# Patient Record
Sex: Female | Born: 1945 | Race: White | Hispanic: No | Smoking: Former smoker
Health system: Southern US, Community
[De-identification: ages and names within clinical notes are randomized; demographics above are authoritative.]

## PROBLEM LIST (undated history)

## (undated) DIAGNOSIS — E538 Deficiency of other specified B group vitamins: Secondary | ICD-10-CM

## (undated) DIAGNOSIS — M549 Dorsalgia, unspecified: Secondary | ICD-10-CM

## (undated) DIAGNOSIS — C539 Malignant neoplasm of cervix uteri, unspecified: Secondary | ICD-10-CM

## (undated) DIAGNOSIS — F329 Major depressive disorder, single episode, unspecified: Secondary | ICD-10-CM

## (undated) DIAGNOSIS — I10 Essential (primary) hypertension: Secondary | ICD-10-CM

## (undated) DIAGNOSIS — E039 Hypothyroidism, unspecified: Secondary | ICD-10-CM

## (undated) DIAGNOSIS — I2510 Atherosclerotic heart disease of native coronary artery without angina pectoris: Secondary | ICD-10-CM

## (undated) DIAGNOSIS — F32A Depression, unspecified: Secondary | ICD-10-CM

## (undated) DIAGNOSIS — G8929 Other chronic pain: Secondary | ICD-10-CM

## (undated) DIAGNOSIS — I219 Acute myocardial infarction, unspecified: Secondary | ICD-10-CM

## (undated) DIAGNOSIS — K219 Gastro-esophageal reflux disease without esophagitis: Secondary | ICD-10-CM

## (undated) DIAGNOSIS — F419 Anxiety disorder, unspecified: Secondary | ICD-10-CM

## (undated) DIAGNOSIS — E559 Vitamin D deficiency, unspecified: Secondary | ICD-10-CM

## (undated) DIAGNOSIS — C801 Malignant (primary) neoplasm, unspecified: Principal | ICD-10-CM

## (undated) DIAGNOSIS — D649 Anemia, unspecified: Secondary | ICD-10-CM

## (undated) HISTORY — PX: OTHER SURGICAL HISTORY: SHX169

## (undated) HISTORY — DX: Malignant neoplasm of cervix uteri, unspecified: C53.9

## (undated) HISTORY — PX: ABDOMINAL HYSTERECTOMY: SHX81

## (undated) HISTORY — PX: CARDIAC CATHETERIZATION: SHX172

## (undated) HISTORY — DX: Malignant (primary) neoplasm, unspecified: C80.1

## (undated) HISTORY — PX: CORONARY ARTERY BYPASS GRAFT: SHX141

---

## 1982-04-05 DIAGNOSIS — C539 Malignant neoplasm of cervix uteri, unspecified: Secondary | ICD-10-CM

## 1982-04-05 HISTORY — DX: Malignant neoplasm of cervix uteri, unspecified: C53.9

## 2003-11-18 ENCOUNTER — Encounter: Payer: Self-pay | Admitting: Cardiology

## 2003-11-18 ENCOUNTER — Inpatient Hospital Stay (HOSPITAL_COMMUNITY): Admission: EM | Admit: 2003-11-18 | Discharge: 2003-11-20 | Payer: Self-pay | Admitting: *Deleted

## 2005-09-03 ENCOUNTER — Encounter: Payer: Self-pay | Admitting: Cardiology

## 2005-10-18 ENCOUNTER — Encounter: Payer: Self-pay | Admitting: Physician Assistant

## 2005-10-18 ENCOUNTER — Ambulatory Visit: Payer: Self-pay | Admitting: Cardiology

## 2005-10-21 ENCOUNTER — Ambulatory Visit: Payer: Self-pay | Admitting: Cardiology

## 2009-01-30 ENCOUNTER — Encounter: Payer: Self-pay | Admitting: Cardiology

## 2010-08-21 NOTE — Discharge Summary (Signed)
NAME:  Erika Moreno, Erika Moreno NO.:  [id number]   MEDICAL RECORD NO.:  [id number]                   PATIENT TYPE:  INP   LOCATION:  6522                                 FACILITY:  [facility]   PHYSICIAN:  Willa Rough, M.D.                  DATE OF BIRTH:  1945/07/02   DATE OF ADMISSION:  11/18/2003  DATE OF DISCHARGE:  11/20/2003                                 DISCHARGE SUMMARY   PRIMARY CARE PHYSICIAN:  [place]. [place]ara Chute [place]   CARDIOLOGIST:  Dr. Nona Dell, [facility] at the [facility] office   Patient admitted for progressive chest pain.  Transferred from [facility].   PROBLEM LIST:  1. Progressive chest pain.  Known history of coronary artery disease status     post remote coronary artery bypass graft.  Abnormal Cardiolite in 2003     with preserved ejection fraction.  2. Hypertension.  3. Dyslipidemia.  4. Osteoarthritis.  5. Fibromyalgia.  6. History of subclinical hypothyroidism.  This is apparently stable     requiring no medications with a normal TSH recently by the patient's     primary care physician.  7. Remote history of tobacco use.   The patient admitted this admission, as stated, transferred from [facility] with chest pain, EKG showing no acute ST-T wave changes  compared to previous tracing in November of 2000.  She has evidence of a  previous anterior wall myocardial infarction with an incomplete right bundle  branch block pattern and probable left ventricular hypertrophy.  Initial  cardiac markers were negative.  On this admission the patient states her  last cardiac catheterization was in [place], [place] approximately  four to five years ago.  The patient was seen at the [facility] office in  September of 2003.  She received a Cardiolite which showed an ejection  fraction of 68% with a small inferior apical defect which looked to be  suggestive of mild degree of ischemia.  She  was managed medically at that  time.  This admission chest pain has become progressively worse.  Initial  cardiac enzymes are negative.  EKG without any major changes from previous  tracing.  The patient will be admitted to telemetry and cardiac markers  done.  Will continue home medications with Lovenox and potassium supplement.  This admission will also check a lipid profile and hemoglobin A1C.  On  August 16 Dr. Gerri Spore took patient to cardiac catheterization laboratory.  Left heart catheterization completed.  Also, patient received percutaneous  transluminal coronary angioplasty with placement of a drug-eluting stent in  the ostium and proximal portion of the saphenous vein graft to the first  diagonal branch.  Impression showed preserved left ventricular systolic  function, native two vessel coronary artery disease status post coronary  artery bypass graft, patent left internal mammary artery to the distal left  anterior  descending coronary artery.  The native right coronary artery  itself was 100% occluded.  Results:  Successful placement of a drug-eluting  stent in the ostium of the saphenous vein graft to the saphenous vein graft  to the first diagonal branch.  A 75% stenosis was reduced to 0% residual.  The patient continued to receive Integrilin for 18 hours post  catheterization and will be treated with Plavix for 12 months.  Cardiac  enzymes x3 negative.  Hemoglobin A1C checked this admission 5.3.  Discharging CBC:  WBC is 4.5, hemoglobin 11.0, hematocrit 31.9, platelet  count 179.  PT performed on August 15 12.4 with an INR 0.9.  Initial  potassium on August 15 was 3.5.  The patient supplemented with p.o.  potassium.  Discharge chemistry:  Sodium 135, potassium 3.7, glucose 132,  BUN 10, creatinine 0.9 which was baseline.  Fasting lipid profile done with  a cholesterol of 150, triglycerides 186, HDL 40, LDL 73.  The patient  continued to receive her medications from home  including Lotensin, aspirin,  Lasix, isosorbide, TriCor, Prozac, Flexeril, Lipitor, Valium with the  addition of Plavix and Tylox for p.r.n. pain control.  The patient seen by  Dr. Willa Rough today.  Right groin stable without hematoma or bruit.  Integrilin infusion completed.  The patient agrees to follow up with Dr.  Diona Browner in [place] in two to three weeks.  Will continue all medications prior  to this admission as previously taken with new addition of Plavix 75 mg and  a prescription for nitroglycerin.  The patient instructed to use p.r.n. for  chest discomfort.  The patient instructed to avoid any heavy lifting over 10  pounds x1 week.  No driving or strenuous activity x2 days. Follow a low fat,  low salt, low cholesterol diet.  The patient instructed to gently clean  catheterization site with soap and water.  No tub bathing or swimming x1  week.  She will call the [facility] office for any fever greater than 101,  increased pain or swelling from catheterization site.  Also, will make  appointment for two weeks to follow up with Dr. Diona Browner with the [facility]  office and possibly have potassium level checked at that time as he feels  needs to be since patient is not being discharged home on prescription  potassium per Dr. Myrtis Ser.  The patient states she has had cardiac  rehabilitation in the past.  Cardiac rehabilitation did evaluate patient  this admission and not interested in following the cardiac rehabilitation  phase II at this time.      Dorian Pod, NP                       Willa Rough, M.D.    MB/MEDQ  D:  11/20/2003  T:  11/21/2003  Job:  [id number]   cc:   Jonelle Sidle, M.D. [facility]  [address] [place]  [place] [address]  Fax: [phone]

## 2010-08-21 NOTE — H&P (Signed)
NAME:  Erika, Moreno NO.:  [id number]   MEDICAL RECORD NO.:  [id number]                   PATIENT TYPE:  INP   LOCATION:  2039                                 FACILITY:  [facility]   PHYSICIAN:  Jonelle Sidle, M.D. [facility]        DATE OF BIRTH:  [birth year]   DATE OF ADMISSION:  11/18/2003  DATE OF DISCHARGE:                                HISTORY & PHYSICAL   PRIMARY CARE PHYSICIAN:  Dr. Fara Chute, [place], [place].   CARDIOLOGIST:  Jonelle Sidle, M.D. [facility]   CHIEF COMPLAINT:  Progressive chest pain.   HISTORY OF PRESENT ILLNESS:  Erika Moreno is a 65 year old woman with a  reported history of dyslipidemia, hypertension, and coronary artery disease,  status post previous coronary artery bypass grafting in 1994 in [place],  [place]. I do not have the specific records at hand, but it sounds as if  the patient had three grafts including a LIMA and two vein grafts. Her last  cardiac catheterization reportedly occurred in [place], [place],  approximately four to five years ago and by the patient's account at that  time showed an occluded vein graft with an additional diseased vein graft  and apparently a patent LIMA. We saw her back in September 2003 in the [place]  office at which time she predominately was complaining of palpitations and  underwent a fairly unremarkable event recorder as well as a Cardiolite which  was reported to show an ejection fraction of 68% with technically  nondiagnostic findings due to failure to achieve 85% of the maximum  predicted heart rate. She had a small inferior apical defect which looked to  be suggestive of a mild degree of ischemia as well as attenuation artifact  involving the inferolateral wall. This study was felt to be overall low risk  and she was managed medically since that time.   Erika Moreno has done well, stating that she is active, working outdoors  much of the time, but over the last six  months has had progressive shortness  of breath with activity, fatigue, and over the last two months had sharp  intermittent left-sided chest discomfort. This occurs both with and without  activity and seems to last only a few seconds to a minute at most. Symptoms  have been progressive and much more frequent over the last few days, and she  had a particularly intense episode while mowing her grass today prompting  evaluation in the [facility] emergency department. She is now  transferred for further assessment.   Electrocardiogram at [facility] showed no acute ST-T wave  changes compared to previous tracing from November 2000. She has evidence of  previous anterior wall myocardial infarction with an incomplete right bundle  branch block pattern and probable left ventricular hypertrophy. Initial  cardiac markers are negative. She is asymptomatic at this point.   ALLERGIES:  MORPHINE (hallucinations).   MEDICATIONS AT HOME:  1. Lotensin  20 mg p.o. daily.  2. Enteric-coated aspirin 325 mg p.o. daily.  3. Lasix 40 mg p.o. daily.  4. Lipitor 10 mg p.o. daily.  5. Imdur 60 mg p.o. daily.  6. Tricor 48 mg p.o. daily.  7. Xanax 0.25 mg p.o. t.i.d. p.r.n.  8. Vicodin one to two tablets p.o. q.4-6h. p.r.n.  9. Prozac 20 mg p.o. daily.  10.      Flexeril 10 mg p.o. t.i.d.   PAST MEDICAL HISTORY:  1. Reported history of remote myocardial infarction just prior to coronary     artery bypass grafting.  It sounds as if the patient was acutely ill and     had cardiac arrest at that time. She underwent three-vessel bypass     grafting at [place], [place], in 1994 as described above.  Her most     recent ejection fraction was 68% by Cardiolite in September 2003.  2. Dyslipidemia, specifics unknown.  3. Hypertension.  4. Osteoarthritis.  5. Fibromyalgia.  6. History of subclinical hyperthyroidism managed at [facility]. This is apparently  stabilized and is requiring no medications     with a reportedly normal TSH recently by the patient's primary care     physician.   FAMILY HISTORY:  Significant for coronary artery disease.   SOCIAL HISTORY:  The patient lives in [place], [place].  She states  that she is disabled presumably due to chronic pain with a history of  osteoarthritis and fibromyalgia. She has a remote history of tobacco use.  She denies any significant other substance abuse.   REVIEW OF SYSTEMS:  As described in history of present illness. She has not  had any problems with recent palpitations, syncope, orthopnea, or PND. She  has had some lower extremity edema, left worse than right. She also has a  numb sensation in her left foot at times.  She is having no difficulty with  cough, hemoptysis, melena, hematochezia, easy bruising, or abdominal  discomfort.   PHYSICAL EXAMINATION:  VITAL SIGNS: On presentation, blood pressure was  120/57 with a heart rate of 57, respirations 22, temperature 98 degrees, and  oxygen saturation 100%.  GENERAL: This is a well-nourished woman, seated in bed in no acute distress,  wearing glasses.  HEENT: Conjunctiva and nose normal. Oropharynx is clear.  NECK: Supple without elevated jugular venous pressure or carotid bruits. No  thyromegaly or thyroid tenderness is noted.  LUNGS: Clear to auscultation with nonlabored breathing at rest.  CARDIAC: Regular rate and rhythm with a 2/6 systolic murmur heard at the  base without radiation. S1 is normal. There is no S3 gallop or pericardial  rub.  ABDOMEN: Soft and nontender with normoactive bowel sounds.  EXTREMITIES: No pitting edema today. Peripheral pulses 2+.  SKIN: No ulcerative changes are noted.  MUSCULOSKELETAL:  No kyphosis is noted.  NEUROPSYCHIATRIC: The patient is alert and oriented times three.  OUTSIDE LABORATORY DATA:  INR 1.0. WBC 4.8, hemoglobin 12.1, platelet count  209,000, glucose 132, BUN 17, creatinine  1.2, sodium 137, potassium 3.2  (repleted at [facility] by report), chloride 105, bicarbonate 23. CK 136, CK-  MB 2.3, troponin-I 0.01.   Chest x-ray from [facility] reported as showing no acute  process.   IMPRESSION:  1. Progressive chest pain syndrome as described in a 65 year old woman with     known coronary artery disease, status post remote coronary artery bypass     grafting. She had a mildly  abnormal Cardiolite in 2003 with preserved     ejection fraction. Her initial cardiac markers are negative and her     electrocardiogram shows no major changes from previous tracing. She has     also had associated shortness of breath with activity and fatigue.  2. Hypertension.  3. Reported dyslipidemia.   PLAN:  1. Will admit the patient to telemetry and cycle cardiac markers.  2. Continue home medications with the addition of Lovenox and potassium     supplementation.  3. Will try to obtain old records to flush out the patient's prior bypass     history.  4. Check fasting lipid profile and hemoglobin A1C.  5. After discussing the risks and benefits with the patient and her     daughter, will plan diagnostic coronary angiography tomorrow to assess     native and bypass graft anatomy. Further plans to follow.                                                Jonelle Sidle, M.D. [facility]    SGM/MEDQ  D:  11/18/2003  T:  11/18/2003  Job:  [id number]

## 2010-08-21 NOTE — Assessment & Plan Note (Signed)
[facility]                            [facility] OFFICE NOTE   SYVILLA, MARTIN                     MRN:          [id number]  DATE:10/18/2005                            DOB:          25-Feb-1946    REASON FOR OFFICE VISIT:  Ms. Cenci is a very pleasant 65 year old  female, with known coronary artery disease, who now returns for followup and  evaluation of recent chest pain and progressive dyspnea.   CARDIAC HISTORY:  Patient's cardiac history notable for myocardial  infarction treated with subsequent coronary artery bypass grafting in [address]  [place], [place]).  She then underwent CYPHER stenting of the proximal  and SVG-  1 diagonal graft, by Dr. Gerri Spore, in August 2005.  Residual anatomy  notable for patent LIMA-LAD and chronic occlusion of the SVG distal RCA  graft.  There was 100% occlusion of the RCA with right-right bridging  collaterals.  Left ventricular function was normal with no wall motion  abnormalities.   Patient remains quite active in and outside of her home, including  gardening.  She can easily climb a flight of stairs or walk up inclines but  with some associated exertional dyspnea which seems to have worsened in the  recent past.  She has no associated exertional chest discomfort but has had  some brief, sharp left upper chest pains which last only approximately 30  seconds in duration.   Patient also has significant reflux disease as well as fibromyalgia.   Electrocardiogram today reveals sinus bradycardia at 52 BPM, with left axis  deviation and an incomplete right bundle branch block with no acute changes.   CURRENT MEDICATIONS:  1.  Furosemide 40 q.d.  2.  Fluoxetine 20 q.d.  3.  Plavix 75q.d.  4.  Lipitor 10 q.d.  5.  Isosorbide mononitrate's 60 q.d.  6.  Premarin.  7.  Tricor 145 q.d.  8.  Benazepril 20 q.d.  9.  Aspirin 325 q.d.  10. Hydrocodone 5/500 two tablets q.i.d.  11. Cyclobenzaprine 10  q.i.d.  12. Xanax 0.25 q.i.d.  13. Fish oil 1200 q.d.   PHYSICAL EXAMINATION:  VITAL SIGNS:  Blood pressure 110/70.  Pulse 50's,  regular.  Weight 158.5.  GENERAL:  65 year old female sitting upright, no apparent distress.  NECK:  Palpable carotid pulse without bruits.  LUNGS:  Clear to auscultation in all fields.  HEART:  Regular rate and rhythm (S1-S2), soft grade 1/6 systolic ejection  murmur in upper LSB.  ABDOMEN:  Soft, non-tender, with intact bowel sounds.  EXTREMITIES:  Palpable femoral pulses without bruits; intact peripheral  pulses with a trace-1+ lower extremity pedal edema (left greater than  right).  NEUROLOGIC:  No focal deficit.   IMPRESSION:  1.  Atypical chest pain.  2.  Coronary artery disease.      1.  Status post myocardial infarction/3 vessel CABG in [address] [place],          [place]).      2.  Status post CYPHER stenting SVG-diagonal graft August 2005, with          widely patent LIMA-LAD graft  and chronically occluded SVG-RCA graft          with right-right bridging collaterals.      3.  Normal left ventricular function.  3.  History of tobacco.      1.  Has not smoked in 20 years.  4.  Hyperlipidemia.  5.  Gastroesophageal reflux disease.  6.  Fibromyalgia.  7.  Sinus Bradycardia   PLAN:  1.  Schedule exercise stress Cardiolite to exclude ischemia.  2.  Return to clinic in two weeks to follow up with Dr. Simona Huh for      review of  study results and further recommendations.                                   Gene Serpe, PA-C   GS/MedQ  DD:  10/18/2005  DT:  10/18/2005  Job #:  [id number]   cc:   Fara Chute

## 2010-08-21 NOTE — Assessment & Plan Note (Signed)
[facility]                            [facility] NOTE   NAME:Erika Moreno, Erika Moreno                     MRN:          [id number]  DATE:10/18/2005                            DOB:          13-Oct-1945    ADDENDUM:  Please add the following at the end of impression:   IMPRESSION:  Sinus bradycardia.                                   Gene Serpe, PA-C   GS/MedQ  DD:  10/18/2005  DT:  10/18/2005  Job #:  [id number]

## 2010-08-21 NOTE — Cardiovascular Report (Signed)
NAME:  Erika Moreno, Erika Moreno                        ACCOUNT NO.:  [id number]   MEDICAL RECORD NO.:  [id number]                   PATIENT TYPE:  INP   LOCATION:  2039                                 FACILITY:  [facility]   PHYSICIAN:  Carole Binning, M.D. [facility]         DATE OF BIRTH:  September 05, 1945   DATE OF PROCEDURE:  11/19/2003  DATE OF DISCHARGE:                              CARDIAC CATHETERIZATION   PROCEDURE PERFORMED:  1. Left heart catheterization, left coronary angiography, left     ventriculography, and bypass graft angiography.  2. Percutaneous transluminal coronary angioplasty with placement of a drug-     eluting stent in the ostium and proximal portion of the saphenous vein     graft to the first diagonal branch.   INDICATIONS:  Mr. Fanara is a 65 year old woman with a history of previous  coronary artery bypass surgery.  She was admitted to [facility] with  unstable angina and transferred to [facility] for cardiac catheterization.   CATHETERIZATION PROCEDURAL NOTE:  A 6 French sheath was placed in the right  femoral artery.  Coronary artery was performed with standard Judkins 6  French catheters.  The saphenous vein grafts and internal mammary artery  were imaged with a JR4 catheter.  Left ventriculography was performed with  an angled pigtail catheter.  Contrast was Omnipaque.  There were no  complications.   RESULTS:  </HEMODYNAMICS>  Left ventricular pressure 126/20, aortic pressure 122/65.  There was no  aortic valve gradient.   LEFT VENTRICULOGRAM:  Wall motion is normal, ejection fraction estimated at  greater than or equal to 60%.  There is no mitral regurgitation.   CORONARY ARTERIOGRAPHY (CO-DOMINANT):  1. The left main has tubular 30% stenosis in the distal vessel.  2. Left anterior descending artery is 100% occluded at its origin.  The     distal LAD and first diagonal branch fill via bypass grafts.  3. Left circumflex is a co-dominant vessel.  The  circumflex gives rise to a     small ramus intermedius, small first obtuse marginal branch, large second     obtuse marginal branch, and a small posterolateral branch.  There is a     30% stenosis in the proximal circumflex.  The distal circumflex after the     second obtuse marginal branch also has a 30% stenosis.  The first     marginal has a 30% stenosis proximally and the large second obtuse     marginal also has a 30% stenosis proximally.  4. Right coronary artery is a co-dominant vessel.  The right coronary artery     is 100% occluded proximally with grade 3 bridging right-to-right     collaterals filling the mid- and distal right coronary artery.  In the     mid-right coronary artery there is a diffuse 20% stenosis, and the distal     right coronary has a diffuse 30% stenosis.  The distal  right coronary     artery ends as a normal-sized posterior descending artery.  5. Left internal mammary artery to the distal LAD is patent throughout its     course.  This vessel fills the mid- and distal LAD, including a small     diagonal branch.  6. Saphenous vein graft to the first diagonal branch has a 75% stenosis at     its ostium, followed by a 30% stenosis in the proximal body.  This graft     fills a large first diagonal vessel.  7. Saphenous vein graft to the right coronary artery is 100% occluded at its     origin.  This appears to be chronic in nature.   IMPRESSION:  1. Preserved left ventricular systolic function.  2. Native two-vessel coronary artery disease.  3. Status post coronary artery bypass surgery.  There is a patent left     internal mammary artery to the distal left anterior descending coronary     artery.  There is a chronic occlusion in the vein graft to the distal     right coronary artery.  The native right coronary artery itself is 100%     occluded but fills via excellent right-to-right bridging collaterals.     There is significant disease in the ostium of the  saphenous vein graft to     the diagonal branch.   PLAN:  These findings were reviewed with Dr. Riley Kill.  It was felt that the  disease in the ostium of the diagonal branch does appear to be  hemodynamically significant.  We therefore opted to proceed with  percutaneous coronary intervention.  See below.   PERCUTANEOUS TRANSLUMINAL CORONARY ANGIOPLASTY PROCEDURAL NOTE:  Following  completion of the diagnostic catheterization, we proceeded with percutaneous  coronary intervention.  We utilized the pre-existing 6 French sheath in the  right femoral artery.  Heparin and Integrilin were administered per  protocol.  We used a 6 [place] LCB guiding catheter. An Asahi soft coronary  guidewire was advanced under fluoroscopic guidance into the distal portion  of the saphenous vein graft.  We carefully positioned a 2.5 x 25 mm Cypher  drug-eluting stent in the proximal portion of the vein graft with care to  position the origin of the stent in the ostium of the vein graft.  We  deployed this stent at 15 atmospheres.  We then went back in with a 3.0 x 15  mm Quantum balloon and inflated this to 16 atmospheres in the proximal  portion of the stent and 10 atmospheres in the midportion of the stent.  Final angiographic images were obtained revealing the patency of the  saphenous vein graft with 0% residual stenosis at the stent site and TIMI-3  flow.   At the conclusion an Angioseal vascular closure device was placed in the  right femoral artery with good hemostasis.   COMPLICATIONS:  None.   RESULTS:  Successful placement of a drug-eluting stent in the ostium of the  saphenous vein graft to the saphenous vein graft to the first diagonal  branch.  A 75% stenosis was reduced to 0% residual, TIMI-3 flow.   PLAN:  Integrilin will be continued for 18 hours.  The patient will be  treated with Plavix for a recommended six to 12 months.  Would recommend medical therapy for residual coronary artery  disease.  Carole Binning, M.D. [facility]    MWP/MEDQ  D:  11/19/2003  T:  11/20/2003  Job:  [id number]   cc:   Fara Chute  [address] [place]  [place] [address]  Fax: [phone]   [facility], [place], [place]   Cardiac Catheterization Lab

## 2012-09-13 ENCOUNTER — Encounter (INDEPENDENT_AMBULATORY_CARE_PROVIDER_SITE_OTHER): Payer: PRIVATE HEALTH INSURANCE | Admitting: Internal Medicine

## 2012-09-13 DIAGNOSIS — Z800 Family history of malignant neoplasm of digestive organs: Secondary | ICD-10-CM

## 2012-09-13 DIAGNOSIS — C778 Secondary and unspecified malignant neoplasm of lymph nodes of multiple regions: Secondary | ICD-10-CM

## 2012-09-13 DIAGNOSIS — C801 Malignant (primary) neoplasm, unspecified: Secondary | ICD-10-CM

## 2012-09-13 DIAGNOSIS — D509 Iron deficiency anemia, unspecified: Secondary | ICD-10-CM

## 2012-09-25 ENCOUNTER — Encounter: Payer: Self-pay | Admitting: Internal Medicine

## 2012-09-27 ENCOUNTER — Other Ambulatory Visit (HOSPITAL_COMMUNITY): Payer: Self-pay | Admitting: Internal Medicine

## 2012-09-27 ENCOUNTER — Encounter (INDEPENDENT_AMBULATORY_CARE_PROVIDER_SITE_OTHER): Payer: PRIVATE HEALTH INSURANCE | Admitting: Internal Medicine

## 2012-09-27 DIAGNOSIS — D509 Iron deficiency anemia, unspecified: Secondary | ICD-10-CM

## 2012-09-27 DIAGNOSIS — C50919 Malignant neoplasm of unspecified site of unspecified female breast: Secondary | ICD-10-CM

## 2012-10-03 ENCOUNTER — Encounter (HOSPITAL_COMMUNITY)
Admission: RE | Admit: 2012-10-03 | Discharge: 2012-10-03 | Disposition: A | Discharge status: Home / Self Care | Payer: PRIVATE HEALTH INSURANCE | Source: Ambulatory Visit | Attending: Internal Medicine | Admitting: Internal Medicine

## 2012-10-03 DIAGNOSIS — I700 Atherosclerosis of aorta: Secondary | ICD-10-CM | POA: Insufficient documentation

## 2012-10-03 DIAGNOSIS — N200 Calculus of kidney: Secondary | ICD-10-CM | POA: Insufficient documentation

## 2012-10-03 DIAGNOSIS — N9489 Other specified conditions associated with female genital organs and menstrual cycle: Secondary | ICD-10-CM | POA: Insufficient documentation

## 2012-10-03 DIAGNOSIS — R599 Enlarged lymph nodes, unspecified: Secondary | ICD-10-CM | POA: Insufficient documentation

## 2012-10-03 DIAGNOSIS — C50919 Malignant neoplasm of unspecified site of unspecified female breast: Secondary | ICD-10-CM

## 2012-10-03 LAB — GLUCOSE, CAPILLARY: Glucose-Capillary: 82 mg/dL (ref 70–99)

## 2012-10-03 MED ORDER — FLUDEOXYGLUCOSE F - 18 (FDG) INJECTION
19.2000 | Freq: Once | INTRAVENOUS | Status: AC | PRN
  Administered 2012-10-03: 19.2 via INTRAVENOUS

## 2012-10-04 DIAGNOSIS — C569 Malignant neoplasm of unspecified ovary: Secondary | ICD-10-CM

## 2012-10-04 DIAGNOSIS — C50919 Malignant neoplasm of unspecified site of unspecified female breast: Secondary | ICD-10-CM

## 2012-10-05 ENCOUNTER — Encounter (HOSPITAL_COMMUNITY): Payer: Medicare Other

## 2012-10-10 DIAGNOSIS — C770 Secondary and unspecified malignant neoplasm of lymph nodes of head, face and neck: Secondary | ICD-10-CM

## 2012-10-10 DIAGNOSIS — C801 Malignant (primary) neoplasm, unspecified: Secondary | ICD-10-CM

## 2012-10-10 DIAGNOSIS — C773 Secondary and unspecified malignant neoplasm of axilla and upper limb lymph nodes: Secondary | ICD-10-CM

## 2012-10-10 DIAGNOSIS — Z5111 Encounter for antineoplastic chemotherapy: Secondary | ICD-10-CM

## 2012-10-17 DIAGNOSIS — C770 Secondary and unspecified malignant neoplasm of lymph nodes of head, face and neck: Secondary | ICD-10-CM

## 2012-10-17 DIAGNOSIS — C773 Secondary and unspecified malignant neoplasm of axilla and upper limb lymph nodes: Secondary | ICD-10-CM

## 2012-10-17 DIAGNOSIS — C801 Malignant (primary) neoplasm, unspecified: Secondary | ICD-10-CM

## 2012-10-17 DIAGNOSIS — Z5111 Encounter for antineoplastic chemotherapy: Secondary | ICD-10-CM

## 2012-10-18 ENCOUNTER — Encounter (INDEPENDENT_AMBULATORY_CARE_PROVIDER_SITE_OTHER): Payer: PRIVATE HEALTH INSURANCE

## 2012-10-18 DIAGNOSIS — C773 Secondary and unspecified malignant neoplasm of axilla and upper limb lymph nodes: Secondary | ICD-10-CM

## 2012-10-18 DIAGNOSIS — C770 Secondary and unspecified malignant neoplasm of lymph nodes of head, face and neck: Secondary | ICD-10-CM

## 2012-10-18 DIAGNOSIS — C801 Malignant (primary) neoplasm, unspecified: Secondary | ICD-10-CM

## 2012-10-24 DIAGNOSIS — C801 Malignant (primary) neoplasm, unspecified: Secondary | ICD-10-CM

## 2012-10-24 DIAGNOSIS — Z5111 Encounter for antineoplastic chemotherapy: Secondary | ICD-10-CM

## 2012-10-24 DIAGNOSIS — C50919 Malignant neoplasm of unspecified site of unspecified female breast: Secondary | ICD-10-CM

## 2012-11-06 ENCOUNTER — Encounter (INDEPENDENT_AMBULATORY_CARE_PROVIDER_SITE_OTHER): Payer: PRIVATE HEALTH INSURANCE

## 2012-11-06 DIAGNOSIS — C778 Secondary and unspecified malignant neoplasm of lymph nodes of multiple regions: Secondary | ICD-10-CM

## 2012-11-06 DIAGNOSIS — C770 Secondary and unspecified malignant neoplasm of lymph nodes of head, face and neck: Secondary | ICD-10-CM

## 2012-11-06 DIAGNOSIS — C801 Malignant (primary) neoplasm, unspecified: Secondary | ICD-10-CM

## 2012-11-07 DIAGNOSIS — C801 Malignant (primary) neoplasm, unspecified: Secondary | ICD-10-CM

## 2012-11-07 DIAGNOSIS — C770 Secondary and unspecified malignant neoplasm of lymph nodes of head, face and neck: Secondary | ICD-10-CM

## 2012-11-07 DIAGNOSIS — Z5112 Encounter for antineoplastic immunotherapy: Secondary | ICD-10-CM

## 2012-11-07 DIAGNOSIS — C786 Secondary malignant neoplasm of retroperitoneum and peritoneum: Secondary | ICD-10-CM

## 2012-11-07 DIAGNOSIS — Z5111 Encounter for antineoplastic chemotherapy: Secondary | ICD-10-CM

## 2012-11-14 ENCOUNTER — Encounter (HOSPITAL_COMMUNITY): Payer: Self-pay | Admitting: Oncology

## 2012-11-14 ENCOUNTER — Other Ambulatory Visit (HOSPITAL_COMMUNITY): Payer: Self-pay | Admitting: Oncology

## 2012-11-14 DIAGNOSIS — C801 Malignant (primary) neoplasm, unspecified: Secondary | ICD-10-CM | POA: Insufficient documentation

## 2012-11-14 HISTORY — DX: Malignant (primary) neoplasm, unspecified: C80.1

## 2012-11-15 ENCOUNTER — Encounter (HOSPITAL_BASED_OUTPATIENT_CLINIC_OR_DEPARTMENT_OTHER): Payer: PRIVATE HEALTH INSURANCE

## 2012-11-15 ENCOUNTER — Encounter (HOSPITAL_COMMUNITY): Payer: Self-pay | Admitting: Internal Medicine

## 2012-11-15 ENCOUNTER — Encounter (HOSPITAL_COMMUNITY): Payer: PRIVATE HEALTH INSURANCE | Attending: Internal Medicine

## 2012-11-15 VITALS — BP 127/62 | HR 58 | Temp 98.7°F | Resp 18

## 2012-11-15 VITALS — BP 118/65 | HR 67 | Temp 98.0°F | Resp 18 | Ht 64.0 in | Wt 146.6 lb

## 2012-11-15 DIAGNOSIS — Z5111 Encounter for antineoplastic chemotherapy: Secondary | ICD-10-CM

## 2012-11-15 DIAGNOSIS — C801 Malignant (primary) neoplasm, unspecified: Secondary | ICD-10-CM

## 2012-11-15 DIAGNOSIS — C773 Secondary and unspecified malignant neoplasm of axilla and upper limb lymph nodes: Secondary | ICD-10-CM

## 2012-11-15 LAB — COMPREHENSIVE METABOLIC PANEL
AST: 20 U/L (ref 0–37)
CO2: 26 mEq/L (ref 19–32)
Creatinine, Ser: 0.64 mg/dL (ref 0.50–1.10)
Potassium: 4.7 mEq/L (ref 3.5–5.1)
Total Bilirubin: 0.3 mg/dL (ref 0.3–1.2)
Total Protein: 6.7 g/dL (ref 6.0–8.3)

## 2012-11-15 LAB — CBC WITH DIFFERENTIAL/PLATELET
Basophils Relative: 0 % (ref 0–1)
Eosinophils Absolute: 0 10*3/uL (ref 0.0–0.7)
Eosinophils Relative: 0 % (ref 0–5)
Lymphs Abs: 0.7 10*3/uL (ref 0.7–4.0)
MCH: 29.2 pg (ref 26.0–34.0)
MCV: 86.7 fL (ref 78.0–100.0)
Monocytes Relative: 3 % (ref 3–12)
Neutrophils Relative %: 82 % — ABNORMAL HIGH (ref 43–77)
RBC: 3.39 MIL/uL — ABNORMAL LOW (ref 3.87–5.11)
RDW: 17.8 % — ABNORMAL HIGH (ref 11.5–15.5)
WBC: 4.7 10*3/uL (ref 4.0–10.5)

## 2012-11-15 MED ORDER — PALONOSETRON HCL INJECTION 0.25 MG/5ML
0.2500 mg | Freq: Once | INTRAVENOUS | Status: AC
  Administered 2012-11-15: 0.25 mg via INTRAVENOUS

## 2012-11-15 MED ORDER — DIPHENHYDRAMINE HCL 50 MG/ML IJ SOLN
25.0000 mg | Freq: Once | INTRAMUSCULAR | Status: AC
  Administered 2012-11-15: 25 mg via INTRAVENOUS

## 2012-11-15 MED ORDER — DIPHENHYDRAMINE HCL 50 MG/ML IJ SOLN
INTRAMUSCULAR | Status: AC
  Filled 2012-11-15: qty 1

## 2012-11-15 MED ORDER — HEPARIN SOD (PORK) LOCK FLUSH 100 UNIT/ML IV SOLN
500.0000 [IU] | Freq: Once | INTRAVENOUS | Status: DC | PRN
  Filled 2012-11-15: qty 5

## 2012-11-15 MED ORDER — SODIUM CHLORIDE 0.9 % IV SOLN
Freq: Once | INTRAVENOUS | Status: AC
  Administered 2012-11-15: 09:00:00 via INTRAVENOUS

## 2012-11-15 MED ORDER — FAMOTIDINE IN NACL 20-0.9 MG/50ML-% IV SOLN
20.0000 mg | Freq: Once | INTRAVENOUS | Status: AC
  Administered 2012-11-15: 20 mg via INTRAVENOUS

## 2012-11-15 MED ORDER — ACETAMINOPHEN 325 MG PO TABS
650.0000 mg | ORAL_TABLET | Freq: Once | ORAL | Status: AC
  Administered 2012-11-15: 650 mg via ORAL

## 2012-11-15 MED ORDER — DEXTROSE 5 % IV SOLN
60.0000 mg/m2 | Freq: Once | INTRAVENOUS | Status: AC
  Administered 2012-11-15: 102 mg via INTRAVENOUS
  Filled 2012-11-15: qty 17

## 2012-11-15 MED ORDER — SODIUM CHLORIDE 0.9 % IJ SOLN
10.0000 mL | INTRAMUSCULAR | Status: DC | PRN
  Filled 2012-11-15: qty 10

## 2012-11-15 MED ORDER — ACETAMINOPHEN 325 MG PO TABS
ORAL_TABLET | ORAL | Status: AC
  Filled 2012-11-15: qty 2

## 2012-11-15 MED ORDER — SODIUM CHLORIDE 0.9 % IV SOLN
160.0000 mg | Freq: Once | INTRAVENOUS | Status: AC
  Administered 2012-11-15: 160 mg via INTRAVENOUS
  Filled 2012-11-15: qty 16

## 2012-11-15 MED ORDER — SODIUM CHLORIDE 0.9 % IV SOLN
20.0000 mg | Freq: Once | INTRAVENOUS | Status: AC
  Administered 2012-11-15: 20 mg via INTRAVENOUS
  Filled 2012-11-15: qty 2

## 2012-11-15 MED ORDER — FAMOTIDINE IN NACL 20-0.9 MG/50ML-% IV SOLN
INTRAVENOUS | Status: AC
  Filled 2012-11-15: qty 50

## 2012-11-15 MED ORDER — DEXAMETHASONE SODIUM PHOSPHATE 10 MG/ML IJ SOLN: 20.0000 mg | Freq: Once | INTRAMUSCULAR | Status: DC

## 2012-11-15 MED ORDER — PALONOSETRON HCL INJECTION 0.25 MG/5ML
INTRAVENOUS | Status: AC
  Filled 2012-11-15: qty 5

## 2012-11-15 NOTE — Progress Notes (Signed)
Tolerated well

## 2012-11-20 ENCOUNTER — Other Ambulatory Visit (HOSPITAL_COMMUNITY): Payer: Self-pay | Admitting: Oncology

## 2012-11-20 NOTE — Progress Notes (Signed)
[facility] - [facility]    MEDICAL ONCOLOGY - INITIAL CONSULATION  Referral [facility]  Reason for Referral: continuation of treatment for cancer unknown primary/ovarian :no acute complaints  HPI:  Patient was seen for initial clinic visit, referral from [facility], to continue that schedule therapy.  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  Poorly differentiated cancersupport or breast primary but additional staining suggested possible serosal carcinoma so the differential included breast ovarian or endometrial cancer.  A regimen of Taxol plus carboplatinum plus Avastin was chosen.  Avastin to be given at 7.5 mg per kilogram every 4 weeks.  Initially treated on 10/11/12, with Taxol, 60 mg meter square, carboplatinum AUC 1.5 , and Avastin, then treatment 7/15 and 7/22.  Then 7/29 off.  Then the next cycle of Taxol carboplatin and Avastin on 8/5,  With AUC increased to 2...., now on 8/13, would be due for Taxol and Avastin, week 2, then on 8/20 would be due for Taxol and Avastin week 3, then  the next week off and then  continue if we keep this schedule. Patient also been started on Aromasin because of ER positive disease  It was noted that on 10/03/12, prior to therapy, PET scan showed extensive hypermetabolic adenopathy within the chest abdomen and pelvis 10 bilobed cystic mass in the left adnexa.  On questioning patient was alert and cooperative and did not have any acute distress specifically he was having no neuropathy numbness tingling muscle weakness or incoordination or any stomatitis    Past Medical History  Diagnosis Date  . Cancer of unknown origin 11/14/2012    Favor breast versus ovarian  :  No past surgical history on file.:  No current outpatient  prescriptions on file.   No current facility-administered medications for this visit.     Allergies  Allergen Reactions  . Morphine And Related   . Sulfa Antibiotics   :  No family history on file.:  History   Social History  . Marital Status: Widowed    Spouse Name: N/A    Number of Children: N/A  . Years of Education: N/A   Occupational History  . Not on file.   Social History Main Topics  . Smoking status: Not on file  . Smokeless tobacco: Not on file  . Alcohol Use: Not on file  . Drug Use: Not on file  . Sexual Activity: Not on file   Other Topics Concern  . Not on file   Social History Narrative  . No narrative on file  :  Review of systems, patient has some Gen. Weakness and had some back pain, she has had some discomfort in the skin above the knee, discomfort axilla site of a hard fixed node additionally.  Is not having shortness of breath chest pain or abdominal pain or nausea vomiting there was some numbness in the feet and fingers but did not have muscle weakness, no headache no dizziness no dysuria hematuria no visual disturbances or Exam: @IPVITALS @  General:  well-nourished in no acute distress.  Eyes:  no scleral icterus.  ENT:  There were no thrush.  Neck was without thyromegaly.  Lymphatics:  Negative cervical, supraclavicular ,prior some fullness and tenderness in this area, did not repeat the axilla exam  Respiratory:  lungs were clear bilaterally without wheezing or crackles.  Cardiovascular:  Regular rate and rhythm, .  There was no pedal edema.  GI:  abdomen was , nontender, nondistended, without organomegaly.    Skin exam was without echymosis, petichae.  Neuro exam was nonfocal.    Patient was alerted and oriented.  Attention was good.   Language was appropriate.  Mood was normal without depression.  Speech was not pressured.  Thought content was not tangential.     Lab Results  Component Value Date   WBC 4.7 11/15/2012   HGB 9.9* 11/15/2012    HCT 29.4* 11/15/2012   PLT 126* 11/15/2012   GLUCOSE 135* 11/15/2012   ALT 23 11/15/2012   AST 20 11/15/2012   NA 132* 11/15/2012   K 4.7 11/15/2012   CL 98 11/15/2012   CREATININE 0.64 11/15/2012   BUN 23 11/15/2012   CO2 26 11/15/2012    Additional results, white count 4.7 neutrophils 3.9 h bilirubin 0.3 magnesium 2.0   Pathology:  No results found.  Assessment and Plan: patient with cancer of unknown prim, possible primaries endometrial, breast, ovarian, is also estrogen receptor primary.  As reviewed in the history of present illness patient started treatment with Taxol carboplatin and Avastin week 1, Taxol Carbo week 2 and week 3, week 4 off on a q. 28 day schedule.  Dose was modified slightly in cycle 2  Today is week 2 of cycle 2 and patient will receive Taxol and carboplatinum only with premedications.  Patient had also been started on oral Aromasin  As tumor was estrogen positive.  Labs are okay clinical exam okay no significant neuropathy okay to proceed  Plan with premedications, went on and get Taxol 60 mg/meter squared equals 102 mg and carboplatinum AUC 2 equals 160 mg.  Followup in 1 week.. I would advise discontinue Aromasin and not give hormone treatment simultaneously with chemotherapy  Marin Roberts, MD 11/20/2012,10:25 PM

## 2012-11-21 ENCOUNTER — Ambulatory Visit (HOSPITAL_COMMUNITY): Payer: PRIVATE HEALTH INSURANCE

## 2012-11-21 ENCOUNTER — Encounter (HOSPITAL_COMMUNITY): Payer: Self-pay

## 2012-11-21 ENCOUNTER — Encounter (HOSPITAL_COMMUNITY): Payer: PRIVATE HEALTH INSURANCE

## 2012-11-21 ENCOUNTER — Encounter (HOSPITAL_BASED_OUTPATIENT_CLINIC_OR_DEPARTMENT_OTHER): Payer: PRIVATE HEALTH INSURANCE

## 2012-11-21 VITALS — BP 131/66 | HR 63 | Temp 97.0°F | Resp 18 | Ht 64.0 in | Wt 149.6 lb

## 2012-11-21 DIAGNOSIS — Z170 Estrogen receptor positive status [ER+]: Secondary | ICD-10-CM

## 2012-11-21 DIAGNOSIS — C801 Malignant (primary) neoplasm, unspecified: Secondary | ICD-10-CM

## 2012-11-21 LAB — CBC WITH DIFFERENTIAL/PLATELET
Basophils Absolute: 0 10*3/uL (ref 0.0–0.1)
HCT: 29.7 % — ABNORMAL LOW (ref 36.0–46.0)
Lymphocytes Relative: 26 % (ref 12–46)
Monocytes Absolute: 0.1 10*3/uL (ref 0.1–1.0)
Neutro Abs: 1.8 10*3/uL (ref 1.7–7.7)
RBC: 3.39 MIL/uL — ABNORMAL LOW (ref 3.87–5.11)
RDW: 17.8 % — ABNORMAL HIGH (ref 11.5–15.5)
WBC: 2.6 10*3/uL — ABNORMAL LOW (ref 4.0–10.5)

## 2012-11-21 NOTE — Addendum Note (Signed)
Addended by: Sherral Hammers on: 11/21/2012 06:35 PM   Modules accepted: Level of Service

## 2012-11-21 NOTE — Progress Notes (Signed)
Erika Moreno presented for labwork. Labs per MD order drawn via Peripheral Line 25 gauge needle inserted in lt ac.  Good blood return present. Procedure without incident.  Needle removed intact. Patient tolerated procedure well.

## 2012-11-21 NOTE — Progress Notes (Signed)
Patient History and Physical   Erika Moreno [id number] [birth year] 67 y.o. 11/21/2012  Referring MD: Self referal  Chief Complaint: Cancer of unknown primary   HPI:  I have reviewed her records in the [facility] electronic health record system and [facility] in [place].  Ms. Erika Moreno is a 67 year old womanwho was diagnosed with a cancer of unknown primary following biopsy of left neck on 08/22/2012.  Immunostains showed positivity for cytokeratin AE/AE3, cytokeratin 7, ER, and WT 1.  Cytokeratin 20, S 100 mammoglobin and a CEA were negative.  TTF-1 was also negative . Prior to the neck biopsy patient had the reportedly had  noted lymphadenopathy in the neck lasting several months.    Neck CT dated 08/07/2012  showed Left level IV /supraclavicular mass just lateral to the thyroid and carotid sonheets. Additionally  multiple  malignant appearing left level IV and supraclavicular nodes were also reported. Also noted were left axillary lymph nodes the largest measuring up to 2.7 x 1.6 x 2.4  and ground glass opacity reported in the lung apices.   Subsequent CT of the chest abdomen and pelvis done 09/20/2012 showed mediastinal in the adenopathy, retroperitoneal adenopathy and cyst in the ovary.  Patient had a PET/CT scan on 10/03/12 which showed  "1. Extensive hypermetabolic adenopathy is noted within the chest,  abdomen and pelvis.  2. Cystic and solid, bilobed mass within the left adnexa is  identified. There is intense FDG uptake associated with the solid  components of this mass " Images have been reviewed by me.  On 10/11/2012 she was started on palliative intent chemotherapy with Carb AUC of 1.5 and with 60mg /m2  Taxol  weekly x3 ; then 1 week off. Additionally Aromasin 20 mg daily was added. At cycle 2 Carboplatin was changed to AUC of 2 and Avastin was added at 7.5 mg q 4 weeks.  She tell me that she tolerated 1 st cycle well ,but since beginning the 2nd cycle she has had  problem with shaking of the hands, more on the right. Additionally she has also noticed fatigue. She is eating well and denies any unintended weight loss. Over the last several days she has noted a lot of spontaneous briuses.  She came here today to transfer her care from [facility] to here. She had been receiving chemotherpay in this facility since last week. She is scheduled for completion of cycle 2 as originally outlined by her prior MD Oncologist tomorrow. For her records CA 125 was noted on 11/07/2012 to be 91.5.  She was accompanied by her son [name] and [name].  PMH: Past Medical History  Diagnosis Date  . 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 Cervical cancer 1984    Past Surgical History  Procedure Laterality Date  . Heart bypass      triple bypass with stents    Allergies: Allergies  Allergen Reactions  . Morphine And Related   . Sulfa Antibiotics     Medications: Current outpatient prescriptions:ALPRAZolam (XANAX) 0.25 MG tablet, Take 0.25 mg by mouth at bedtime as needed for sleep., Disp: , Rfl: ;  atorvastatin (LIPITOR) 20 MG tablet, Take 20 mg by mouth daily., Disp: , Rfl: ;  clopidogrel (PLAVIX) 75 MG tablet, Take 75 mg by mouth daily., Disp: , Rfl: ;  cyclobenzaprine (FLEXERIL) 10 MG tablet, Take 10 mg by mouth 3 (three) times daily as needed for muscle spasms., Disp: , Rfl:  dexamethasone (DECADRON) 2 MG tablet, Take 2 mg by mouth 2 (two) times daily with a meal. Days 2,3,4, Disp: , Rfl: ;  exemestane (AROMASIN) 25 MG tablet, Take 25 mg by mouth daily after breakfast., Disp: , Rfl: ;  furosemide (LASIX) 20 MG tablet, Take 20 mg by mouth., Disp: , Rfl: ;  gabapentin (NEURONTIN) 600 MG tablet, Take 600 mg by mouth 2 (two) times daily., Disp: , Rfl:  HYDROcodone-acetaminophen (NORCO) 7.5-325 MG per tablet, Take 1 tablet by mouth every 6 (six) hours as needed for pain., Disp: , Rfl: ;  isosorbide mononitrate (IMDUR) 60 MG 24  hr tablet, Take 60 mg by mouth daily., Disp: , Rfl: ;  lisinopril-hydrochlorothiazide (PRINZIDE,ZESTORETIC) 10-12.5 MG per tablet, Take 1 tablet by mouth daily., Disp: , Rfl: ;  LORazepam (ATIVAN) 1 MG tablet, Take 1 mg by mouth every 8 (eight) hours., Disp: , Rfl:  pantoprazole (PROTONIX) 20 MG tablet, Take 20 mg by mouth daily., Disp: , Rfl: ;  traMADol (ULTRAM) 50 MG tablet, Take 50 mg by mouth every 6 (six) hours as needed for pain., Disp: , Rfl: ;  venlafaxine XR (EFFEXOR-XR) 75 MG 24 hr capsule, Take 75 mg by mouth daily., Disp: , Rfl: ;  nitroGLYCERIN (NITROSTAT) 0.6 MG SL tablet, Place 0.6 mg under the tongue every 5 (five) minutes as needed for chest pain., Disp: , Rfl:    Social History:   reports that she quit smoking about 24 years ago. Her smoking use included Cigarettes. She has a 44 pack-year smoking history. She has never used smokeless tobacco. She reports that she does not drink alcohol or use illicit drugs. Used to work in [employer] about 15 years ago.  Family History: Family History  Problem Relation Age of Onset  . Heart attack Mother   . Heart attack Father   Paternal Grandmother had colon cancer. Children. 2 boys and 1 girl. Lives alone. Came with friend Nadine Counts. One Child lives 5 minutes drive away.  Review of Systems: 14 point review of system is as in the history above otherwise negative.   Physical Exam: Blood pressure 131/66, pulse 63, temperature 97 F (36.1 C), temperature source Oral, resp. rate 18, height 5\' 4"  (1.626 m), weight 149 lb 9.6 oz (67.858 kg). GENERAL: No distress, .  SKIN:  No rashes or significant lesions , multiple ecchymosis but seeing the upper extremities and down trunk especially the abdomen. HEAD: Normocephalic, No masses, lesions, tenderness or abnormalities  EYES: Conjunctiva are pink , non-injected and no jaundice. ENT: External ears normal ,lips, buccal mucosa, and tongue normal and mucous membranes are moist . Denture.s is in place LYMPH: No  palpable lymphadenopathy,  In the neck supraclavicular area or axilla. BREAST:Normal without mass, skin or nipple changes . LUNGS: Clear to auscultation , no crackles or wheezes HEART: regular rate & rhythm, no murmurs, no gallops, S1 normal and S2 normal  ABDOMEN: Abdomen soft, non-tender, normal bowel sounds, no masses or organomegaly and no hepatosplenomegaly palpable.  MSK: Median Sternectomy scar noted EXTREMITIES: No edema, no skin discoloration or tenderness. NEURO: Alert & oriented , no focal motor deficits.     Lab Results: Lab Results  Component Value Date   WBC 4.7 11/15/2012   HGB 9.9* 11/15/2012   HCT 29.4* 11/15/2012   MCV 86.7 11/15/2012   PLT 126* 11/15/2012     Chemistry      Component Value Date/Time   NA 132* 11/15/2012 0855   K 4.7 11/15/2012 0855   CL  98 11/15/2012 0855   CO2 26 11/15/2012 0855   BUN 23 11/15/2012 0855   CREATININE 0.64 11/15/2012 0855      Component Value Date/Time   CALCIUM 9.4 11/15/2012 0855   ALKPHOS 62 11/15/2012 0855   AST 20 11/15/2012 0855   ALT 23 11/15/2012 0855   BILITOT 0.3 11/15/2012 0855         Radiological Studies: As a referenced in the history above.   Impression: Ms. Erika Moreno cancer of unknown primary, potential candidates are breast ,ovary and endometrium considering ER positive status.  Her tumor is ER positive but stains are atypical for breast given negative mammoglobin [id number] and CEA. Also TTF-1 is negative suggesting that lung, primary is less likely. I had a long discussion with the patient and her family members and friend, we discussed the concept of cancer of unknown primary and the fact that often it is very challenging to identify tissue of origin.  Genetics studies sometimes could be helpful but these are largely unvalidated and not yet standard of care.  Think that the carboplatin/Taxol is a very reasonable choice, however I would prefer that this this be given as carboplatin AUC of 6 and paclitaxel  200 mg  per meter squared  every 3 weeks Based on NCCN recommendation.   I am not convinced about the role of Avastin in this case, even though it can be used in ovarian cancer, however this is not the case he has we have not proven this to be ovarian cancer and thus role in  cancer of unknown primary is not well estabilished.   Patient is also on concurrent Aromasin, however based on data from [id number], INT-0100, giving concurrent chemotherapy with  endocrine therapy can result in inferior outcome. I think that if the patient does have  a good response to chemotherapy Aromasin can be employed in maintenance in the future when she is off chemotherapy.    Recommendations: 1.Finish cycle 2 as previously ordered by her prior oncology physician. 2. We will give to patient 2 weeks break after cycle 2 and then begin chemotherapy with paclitaxel/carboplatin every 3 weeks according to NCCN  guidelines. 3. I instructed her to stop Aromasin for now since this may negatively impact her outcome when administered concurrently with chemotherapy.  In the near future we could employ this maintenance therapy when she is off chemotherapy. 4.  Return to clinic in 3 weeks to begin cycle 3 of chemotherapy which will be cycled every 3 weeks. 5. Her shakiness etiology is unclear.  I instructed her to watch this closely and call to inform us if this gets worse. I instructed her to try when necessary Xanax which she already has as this sometimes can stem from anxiety. 6. Have ordered tumor markers prior to cycle #3. 7. With discussed response in evaluation and I recommended that this be done at 2 months after starting chemotherapy which think will be sometime in October. 8. I ordered a CBC today to check her platelets give a multiple bruises.     All questions were satisfactorily answered.She knows to call if she has any concern.  I spent 50% of the time was spent counseling the patient face to face. The total time spent in the  appointment was 60 minutes.   Sherral Hammers, MD FACP. Hematology/Oncology.   Addendum: CBC today reviewed, most likely patient's bruises may be more relation to antiplatelet agent given normal platelet count. We watch closely.  [name]

## 2012-11-21 NOTE — Patient Instructions (Signed)
.  [facility] Discharge Instructions  RECOMMENDATIONS MADE BY THE CONSULTANT AND ANY TEST RESULTS WILL BE SENT TO YOUR REFERRING PHYSICIAN.  EXAM FINDINGS BY THE PHYSICIAN TODAY AND SIGNS OR SYMPTOMS TO REPORT TO CLINIC OR PRIMARY PHYSICIAN: Treatment options discussed and plan is to treat with Carbo Taxol every 3 weeks starting with your treatment 2 weeks from tomorrow.  MEDICATIONS PRESCRIBED:  We will have our nurse navigator talk with you and fix you a calender of treatments and meds you need to take  INSTRUCTIONS GIVEN AND DISCUSSED: Stop aromasin We will stop avastin  SPECIAL INSTRUCTIONS/FOLLOW-UP: Dr. Caesar Bookman  Thank you for choosing [facility] to provide your oncology and hematology care.  To afford each patient quality time with our providers, please arrive at least 15 minutes before your scheduled appointment time.  With your help, our goal is to use those 15 minutes to complete the necessary work-up to ensure our physicians have the information they need to help with your evaluation and healthcare recommendations.    Effective January 1st, 2014, we ask that you re-schedule your appointment with our physicians should you arrive 10 or more minutes late for your appointment.  We strive to give you quality time with our providers, and arriving late affects you and other patients whose appointments are after yours.    Again, thank you for choosing [facility].  Our hope is that these requests will decrease the amount of time that you wait before being seen by our physicians.       _____________________________________________________________  Should you have questions after your visit to [facility], please contact our office at [phone] between the hours of 8:30 a.m. and 5:00 p.m.  Voicemails left after 4:30 p.m. will not be returned until the following business day.  For prescription refill requests, have your pharmacy  contact our office with your prescription refill request.

## 2012-11-22 ENCOUNTER — Other Ambulatory Visit (HOSPITAL_COMMUNITY): Payer: Self-pay | Admitting: Hematology and Oncology

## 2012-11-22 ENCOUNTER — Encounter (HOSPITAL_BASED_OUTPATIENT_CLINIC_OR_DEPARTMENT_OTHER): Payer: PRIVATE HEALTH INSURANCE

## 2012-11-22 VITALS — BP 138/73 | HR 63 | Temp 98.3°F | Resp 18 | Wt 146.6 lb

## 2012-11-22 DIAGNOSIS — C801 Malignant (primary) neoplasm, unspecified: Secondary | ICD-10-CM

## 2012-11-22 DIAGNOSIS — Z5111 Encounter for antineoplastic chemotherapy: Secondary | ICD-10-CM

## 2012-11-22 MED ORDER — DEXAMETHASONE SODIUM PHOSPHATE 10 MG/ML IJ SOLN: 20.0000 mg | Freq: Once | INTRAMUSCULAR | Status: DC

## 2012-11-22 MED ORDER — FAMOTIDINE IN NACL 20-0.9 MG/50ML-% IV SOLN
20.0000 mg | Freq: Once | INTRAVENOUS | Status: AC
  Administered 2012-11-22: 20 mg via INTRAVENOUS

## 2012-11-22 MED ORDER — SODIUM CHLORIDE 0.9 % IJ SOLN
10.0000 mL | INTRAMUSCULAR | Status: DC | PRN
  Administered 2012-11-22: 10 mL
  Filled 2012-11-22: qty 10

## 2012-11-22 MED ORDER — DIPHENHYDRAMINE HCL 50 MG/ML IJ SOLN
INTRAMUSCULAR | Status: AC
  Filled 2012-11-22: qty 1

## 2012-11-22 MED ORDER — FAMOTIDINE IN NACL 20-0.9 MG/50ML-% IV SOLN
INTRAVENOUS | Status: AC
  Filled 2012-11-22: qty 50

## 2012-11-22 MED ORDER — HEPARIN SOD (PORK) LOCK FLUSH 100 UNIT/ML IV SOLN
500.0000 [IU] | Freq: Once | INTRAVENOUS | Status: DC | PRN
  Filled 2012-11-22: qty 5

## 2012-11-22 MED ORDER — PALONOSETRON HCL INJECTION 0.25 MG/5ML
0.2500 mg | Freq: Once | INTRAVENOUS | Status: AC
  Administered 2012-11-22: 0.25 mg via INTRAVENOUS

## 2012-11-22 MED ORDER — PACLITAXEL CHEMO INJECTION 300 MG/50ML
60.0000 mg/m2 | Freq: Once | INTRAVENOUS | Status: AC
  Administered 2012-11-22: 102 mg via INTRAVENOUS
  Filled 2012-11-22: qty 17

## 2012-11-22 MED ORDER — ACETAMINOPHEN 325 MG PO TABS: 650.0000 mg | ORAL_TABLET | Freq: Once | ORAL | Status: DC

## 2012-11-22 MED ORDER — DIPHENHYDRAMINE HCL 50 MG/ML IJ SOLN
25.0000 mg | Freq: Once | INTRAMUSCULAR | Status: AC
  Administered 2012-11-22: 25 mg via INTRAVENOUS

## 2012-11-22 MED ORDER — SODIUM CHLORIDE 0.9 % IV SOLN
20.0000 mg | Freq: Once | INTRAVENOUS | Status: AC
  Administered 2012-11-22: 20 mg via INTRAVENOUS
  Filled 2012-11-22: qty 2

## 2012-11-22 MED ORDER — SODIUM CHLORIDE 0.9 % IV SOLN
160.0000 mg | Freq: Once | INTRAVENOUS | Status: AC
  Administered 2012-11-22: 160 mg via INTRAVENOUS
  Filled 2012-11-22: qty 16

## 2012-11-22 MED ORDER — SODIUM CHLORIDE 0.9 % IV SOLN
Freq: Once | INTRAVENOUS | Status: AC
  Administered 2012-11-22: 09:00:00 via INTRAVENOUS

## 2012-11-22 MED ORDER — PALONOSETRON HCL INJECTION 0.25 MG/5ML
INTRAVENOUS | Status: AC
  Filled 2012-11-22: qty 5

## 2012-11-22 NOTE — Progress Notes (Signed)
Tolerated chemo well today. 

## 2012-11-23 ENCOUNTER — Ambulatory Visit (HOSPITAL_COMMUNITY): Payer: PRIVATE HEALTH INSURANCE

## 2012-11-28 ENCOUNTER — Encounter (HOSPITAL_COMMUNITY): Payer: Self-pay | Admitting: Pharmacy Technician

## 2012-11-28 ENCOUNTER — Telehealth (HOSPITAL_COMMUNITY): Payer: Self-pay | Admitting: *Deleted

## 2012-11-28 NOTE — Telephone Encounter (Signed)
Attempted to reach pt but no answer, message left on answering machine for pt to call clinic.

## 2012-11-30 ENCOUNTER — Encounter (HOSPITAL_COMMUNITY): Payer: Self-pay

## 2012-11-30 ENCOUNTER — Other Ambulatory Visit: Payer: Self-pay

## 2012-11-30 ENCOUNTER — Encounter (HOSPITAL_COMMUNITY)
Admission: RE | Admit: 2012-11-30 | Discharge: 2012-11-30 | Disposition: A | Discharge status: Home / Self Care | Payer: PRIVATE HEALTH INSURANCE | Source: Ambulatory Visit | Attending: General Surgery | Admitting: General Surgery

## 2012-11-30 DIAGNOSIS — Z01812 Encounter for preprocedural laboratory examination: Secondary | ICD-10-CM | POA: Insufficient documentation

## 2012-11-30 DIAGNOSIS — Z01810 Encounter for preprocedural cardiovascular examination: Secondary | ICD-10-CM | POA: Insufficient documentation

## 2012-11-30 HISTORY — DX: Gastro-esophageal reflux disease without esophagitis: K21.9

## 2012-11-30 HISTORY — DX: Atherosclerotic heart disease of native coronary artery without angina pectoris: I25.10

## 2012-11-30 HISTORY — DX: Anemia, unspecified: D64.9

## 2012-11-30 HISTORY — DX: Acute myocardial infarction, unspecified: I21.9

## 2012-11-30 HISTORY — DX: Essential (primary) hypertension: I10

## 2012-11-30 HISTORY — DX: Other chronic pain: G89.29

## 2012-11-30 HISTORY — DX: Vitamin D deficiency, unspecified: E55.9

## 2012-11-30 HISTORY — DX: Anxiety disorder, unspecified: F41.9

## 2012-11-30 HISTORY — DX: Depression, unspecified: F32.A

## 2012-11-30 HISTORY — DX: Hypothyroidism, unspecified: E03.9

## 2012-11-30 HISTORY — DX: Deficiency of other specified B group vitamins: E53.8

## 2012-11-30 HISTORY — DX: Major depressive disorder, single episode, unspecified: F32.9

## 2012-11-30 HISTORY — DX: Dorsalgia, unspecified: M54.9

## 2012-11-30 LAB — HEMOGLOBIN AND HEMATOCRIT, BLOOD: Hemoglobin: 9.9 g/dL — ABNORMAL LOW (ref 12.0–15.0)

## 2012-11-30 NOTE — Patient Instructions (Addendum)
BELICIA DIFATTA  11/30/2012   Your procedure is scheduled on:   12/06/2012   Report to [facility] at  1000  AM.  Call this number if you have problems the morning of surgery: [phone]   Remember:   Do not eat food or drink liquids after midnight.   Take these medicines the morning of surgery with A SIP OF WATER: xanax, decadron, neurontin, norco, imdur,prinizide, ativan, protonix, ultram, efexor   Do not wear jewelry, make-up or nail polish.  Do not wear lotions, powders, or perfumes.   Do not shave 48 hours prior to surgery. Men may shave face and neck.  Do not bring valuables to the hospital.  [facility] is not responsible for any belongings or valuables.  Contacts, dentures or bridgework may not be worn into surgery.  Leave suitcase in the car. After surgery it may be brought to your room.  For patients admitted to the hospital, checkout time is 11:00 AM the day of discharge.   Patients discharged the day of surgery will not be allowed to drive home.  Name and phone number of your driver: family  Special Instructions: Shower using CHG 2 nights before surgery and the night before surgery.  If you shower the day of surgery use CHG.  Use special wash - you have one bottle of CHG for all showers.  You should use approximately 1/3 of the bottle for each shower.   Please read over the following fact sheets that you were given: Pain Booklet, Coughing and Deep Breathing, Surgical Site Infection Prevention, Anesthesia Post-op Instructions and Care and Recovery After Surgery Implanted Port Instructions An implanted port is a central line that has a round shape and is placed under the skin. It is used for long-term IV (intravenous) access for:  Medicine.  Fluids.  Liquid nutrition, such as TPN (total parenteral nutrition).  Blood samples. Ports can be placed:  In the chest area just below the collarbone (this is the most common place.)  In the arms.  In the belly (abdomen)  area.  In the legs. PARTS OF THE PORT A port has 2 main parts:  The reservoir. The reservoir is round, disc-shaped, and will be a small, raised area under your skin.  The reservoir is the part where a needle is inserted (accessed) to either give medicines or to draw blood.  The catheter. The catheter is a long, slender tube that extends from the reservoir. The catheter is placed into a large vein.  Medicine that is inserted into the reservoir goes into the catheter and then into the vein. INSERTION OF THE PORT  The port is surgically placed in either an operating room or in a procedural area (interventional radiology).  Medicine may be given to help you relax during the procedure.  The skin where the port will be inserted is numbed (local anesthetic).  1 or 2 small cuts (incisions) will be made in the skin to insert the port.  The port can be used after it has been inserted. INCISION SITE CARE  The incision site may have small adhesive strips on it. This helps keep the incision site closed. Sometimes, no adhesive strips are placed. Instead of adhesive strips, a special kind of surgical glue is used to keep the incision closed.  If adhesive strips were placed on the incision sites, do not take them off. They will fall off on their own.  The incision site may be sore for 1  to 2 days. Pain medicine can help.  Do not get the incision site wet. Bathe or shower as directed by your caregiver.  The incision site should heal in 5 to 7 days. A small scar may form after the incision has healed. ACCESSING THE PORT Special steps must be taken to access the port:  Before the port is accessed, a numbing cream can be placed on the skin. This helps numb the skin over the port site.  A sterile technique is used to access the port.  The port is accessed with a needle. Only "non-coring" port needles should be used to access the port. Once the port is accessed, a blood return should be checked.  This helps ensure the port is in the vein and is not clogged (clotted).  If your caregiver believes your port should remain accessed, a clear (transparent) bandage will be placed over the needle site. The bandage and needle will need to be changed every week or as directed by your caregiver.  Keep the bandage covering the needle clean and dry. Do not get it wet. Follow your caregiver's instructions on how to take a shower or bath when the port is accessed.  If your port does not need to stay accessed, no bandage is needed over the port. FLUSHING THE PORT Flushing the port keeps it from getting clogged. How often the port is flushed depends on:  If a constant infusion is running. If a constant infusion is running, the port may not need to be flushed.  If intermittent medicines are given.  If the port is not being used. For intermittent medicines:  The port will need to be flushed:  After medicines have been given.  After blood has been drawn.  As part of routine maintenance.  A port is normally flushed with:  Normal saline.  Heparin.  Follow your caregiver's advice on how often, how much, and the type of flush to use on your port. IMPORTANT PORT INFORMATION  Tell your caregiver if you are allergic to heparin.  After your port is placed, you will get a manufacturer's information card. The card has information about your port. Keep this card with you at all times.  There are many types of ports available. Know what kind of port you have.  In case of an emergency, it may be helpful to wear a medical alert bracelet. This can help alert health care workers that you have a port.  The port can stay in for as long as your caregiver believes it is necessary.  When it is time for the port to come out, surgery will be done to remove it. The surgery will be similar to how the port was put in.  If you are in the hospital or clinic:  Your port will be taken care of and flushed by a  nurse.  If you are at home:  A home health care nurse may give medicines and take care of the port.  You or a family member can get special training and directions for giving medicine and taking care of the port at home. SEEK IMMEDIATE MEDICAL CARE IF:   Your port does not flush or you are unable to get a blood return.  New drainage or pus is coming from the incision.  A bad smell is coming from the incision site.  You develop swelling or increased redness at the incision site.  You develop increased swelling or pain at the port site.  You develop  swelling or pain in the surrounding skin near the port.  You have an oral temperature above 102 F (38.9 C), not controlled by medicine. MAKE SURE YOU:   Understand these instructions.  Will watch your condition.  Will get help right away if you are not doing well or get worse. Document Released: 03/22/2005 Document Revised: 06/14/2011 Document Reviewed: 06/13/2008 [employer] Patient Information 2014 [place], [place]. PATIENT INSTRUCTIONS POST-ANESTHESIA  IMMEDIATELY FOLLOWING SURGERY:  Do not drive or operate machinery for the first twenty four hours after surgery.  Do not make any important decisions for twenty four hours after surgery or while taking narcotic pain medications or sedatives.  If you develop intractable nausea and vomiting or a severe headache please notify your doctor immediately.  FOLLOW-UP:  Please make an appointment with your surgeon as instructed. You do not need to follow up with anesthesia unless specifically instructed to do so.  WOUND CARE INSTRUCTIONS (if applicable):  Keep a dry clean dressing on the anesthesia/puncture wound site if there is drainage.  Once the wound has quit draining you may leave it open to air.  Generally you should leave the bandage intact for twenty four hours unless there is drainage.  If the epidural site drains for more than 36-48 hours please call the anesthesia  department.  QUESTIONS?:  Please feel free to call your physician or the hospital operator if you have any questions, and they will be happy to assist you.

## 2012-12-01 NOTE — Pre-Procedure Instructions (Signed)
Dr Jayme Cloud aware of H &H.

## 2012-12-06 ENCOUNTER — Ambulatory Visit (HOSPITAL_COMMUNITY): Payer: PRIVATE HEALTH INSURANCE

## 2012-12-06 ENCOUNTER — Encounter (HOSPITAL_COMMUNITY): Payer: Self-pay | Admitting: *Deleted

## 2012-12-06 ENCOUNTER — Encounter (HOSPITAL_COMMUNITY): Payer: Self-pay | Admitting: Anesthesiology

## 2012-12-06 ENCOUNTER — Inpatient Hospital Stay (HOSPITAL_COMMUNITY): Payer: PRIVATE HEALTH INSURANCE

## 2012-12-06 ENCOUNTER — Encounter (HOSPITAL_COMMUNITY)
Admission: RE | Disposition: A | Discharge status: Home / Self Care | Payer: Self-pay | Source: Ambulatory Visit | Attending: General Surgery

## 2012-12-06 ENCOUNTER — Ambulatory Visit (HOSPITAL_COMMUNITY): Payer: PRIVATE HEALTH INSURANCE | Admitting: Anesthesiology

## 2012-12-06 ENCOUNTER — Ambulatory Visit (HOSPITAL_COMMUNITY)
Admission: RE | Admit: 2012-12-06 | Discharge: 2012-12-06 | Disposition: A | Discharge status: Home / Self Care | Payer: PRIVATE HEALTH INSURANCE | Source: Ambulatory Visit | Attending: General Surgery | Admitting: General Surgery

## 2012-12-06 DIAGNOSIS — C801 Malignant (primary) neoplasm, unspecified: Secondary | ICD-10-CM

## 2012-12-06 DIAGNOSIS — Z951 Presence of aortocoronary bypass graft: Secondary | ICD-10-CM | POA: Insufficient documentation

## 2012-12-06 DIAGNOSIS — Z79899 Other long term (current) drug therapy: Secondary | ICD-10-CM | POA: Insufficient documentation

## 2012-12-06 DIAGNOSIS — I10 Essential (primary) hypertension: Secondary | ICD-10-CM | POA: Insufficient documentation

## 2012-12-06 HISTORY — PX: PORTACATH PLACEMENT: SHX2246

## 2012-12-06 SURGERY — INSERTION, TUNNELED CENTRAL VENOUS DEVICE, WITH PORT
Anesthesia: Monitor Anesthesia Care | Site: Chest | Laterality: Right | Wound class: Clean

## 2012-12-06 MED ORDER — PROPOFOL 10 MG/ML IV EMUL
INTRAVENOUS | Status: AC
  Filled 2012-12-06: qty 20

## 2012-12-06 MED ORDER — ONDANSETRON HCL 4 MG/2ML IJ SOLN: 4.0000 mg | Freq: Once | INTRAMUSCULAR | Status: DC | PRN

## 2012-12-06 MED ORDER — LACTATED RINGERS IV SOLN
INTRAVENOUS | Status: DC
  Administered 2012-12-06: 1000 mL via INTRAVENOUS

## 2012-12-06 MED ORDER — PROPOFOL INFUSION 10 MG/ML OPTIME
INTRAVENOUS | Status: DC | PRN
  Administered 2012-12-06: 50 ug/kg/min via INTRAVENOUS

## 2012-12-06 MED ORDER — HEPARIN SOD (PORK) LOCK FLUSH 100 UNIT/ML IV SOLN
INTRAVENOUS | Status: AC
  Filled 2012-12-06: qty 5

## 2012-12-06 MED ORDER — HEPARIN SODIUM (PORCINE) 1000 UNIT/ML IJ SOLN
INTRAMUSCULAR | Status: AC
  Filled 2012-12-06: qty 3

## 2012-12-06 MED ORDER — FENTANYL CITRATE 0.05 MG/ML IJ SOLN
INTRAMUSCULAR | Status: DC | PRN
  Administered 2012-12-06 (×2): 50 ug via INTRAVENOUS

## 2012-12-06 MED ORDER — ONDANSETRON HCL 4 MG/2ML IJ SOLN
INTRAMUSCULAR | Status: AC
  Filled 2012-12-06: qty 2

## 2012-12-06 MED ORDER — ONDANSETRON HCL 4 MG/2ML IJ SOLN
4.0000 mg | Freq: Once | INTRAMUSCULAR | Status: AC
  Administered 2012-12-06: 4 mg via INTRAVENOUS

## 2012-12-06 MED ORDER — FENTANYL CITRATE 0.05 MG/ML IJ SOLN
INTRAMUSCULAR | Status: AC
  Filled 2012-12-06: qty 2

## 2012-12-06 MED ORDER — CEFAZOLIN SODIUM-DEXTROSE 2-3 GM-% IV SOLR
2.0000 g | INTRAVENOUS | Status: AC
  Administered 2012-12-06: 2 g via INTRAVENOUS

## 2012-12-06 MED ORDER — CHLORHEXIDINE GLUCONATE 4 % EX LIQD: 1.0000 "application " | Freq: Once | CUTANEOUS | Status: DC

## 2012-12-06 MED ORDER — FENTANYL CITRATE 0.05 MG/ML IJ SOLN
25.0000 ug | INTRAMUSCULAR | Status: AC
  Administered 2012-12-06 (×2): 25 ug via INTRAVENOUS

## 2012-12-06 MED ORDER — MIDAZOLAM HCL 2 MG/2ML IJ SOLN
1.0000 mg | INTRAMUSCULAR | Status: DC | PRN
  Administered 2012-12-06: 2 mg via INTRAVENOUS

## 2012-12-06 MED ORDER — HEPARIN SODIUM (PORCINE) 1000 UNIT/ML IJ SOLN
INTRAMUSCULAR | Status: DC | PRN
  Administered 2012-12-06: 3000 [IU] via INTRAVENOUS

## 2012-12-06 MED ORDER — LIDOCAINE HCL (PF) 1 % IJ SOLN
INTRAMUSCULAR | Status: AC
  Filled 2012-12-06: qty 30

## 2012-12-06 MED ORDER — MIDAZOLAM HCL 2 MG/2ML IJ SOLN
INTRAMUSCULAR | Status: AC
  Filled 2012-12-06: qty 2

## 2012-12-06 MED ORDER — FENTANYL CITRATE 0.05 MG/ML IJ SOLN: 25.0000 ug | INTRAMUSCULAR | Status: DC | PRN

## 2012-12-06 MED ORDER — HYDROCODONE-ACETAMINOPHEN 7.5-325 MG PO TABS: 1.0000 | ORAL_TABLET | Freq: Four times a day (QID) | ORAL | Status: DC | PRN

## 2012-12-06 MED ORDER — MIDAZOLAM HCL 5 MG/5ML IJ SOLN
INTRAMUSCULAR | Status: DC | PRN
  Administered 2012-12-06: 2 mg via INTRAVENOUS

## 2012-12-06 MED ORDER — SODIUM CHLORIDE 0.9 % IV SOLN
INTRAVENOUS | Status: DC | PRN
  Administered 2012-12-06: 500 mL via INTRAMUSCULAR

## 2012-12-06 MED ORDER — LIDOCAINE HCL (PF) 1 % IJ SOLN
INTRAMUSCULAR | Status: DC | PRN
  Administered 2012-12-06: 20 mL

## 2012-12-06 MED ORDER — CEFAZOLIN SODIUM-DEXTROSE 2-3 GM-% IV SOLR
INTRAVENOUS | Status: AC
  Filled 2012-12-06: qty 50

## 2012-12-06 MED ORDER — LIDOCAINE HCL (PF) 1 % IJ SOLN
INTRAMUSCULAR | Status: AC
  Filled 2012-12-06: qty 5

## 2012-12-06 SURGICAL SUPPLY — 45 items
APL SKNCLS STERI-STRIP NONHPOA (GAUZE/BANDAGES/DRESSINGS) ×2
APPLIER CLIP 9.375 SM OPEN (CLIP)
APR CLP SM 9.3 20 MLT OPN (CLIP)
BAG DECANTER FOR FLEXI CONT (MISCELLANEOUS) ×2 IMPLANT
BAG HAMPER (MISCELLANEOUS) ×2 IMPLANT
BENZOIN TINCTURE PRP APPL 2/3 (GAUZE/BANDAGES/DRESSINGS) ×3 IMPLANT
CATH HICKMAN DUAL 12.0 (CATHETERS) IMPLANT
CLIP APPLIE 9.375 SM OPEN (CLIP) IMPLANT
CLOTH BEACON ORANGE TIMEOUT ST (SAFETY) ×2 IMPLANT
COVER LIGHT HANDLE STERIS (MISCELLANEOUS) ×4 IMPLANT
DECANTER SPIKE VIAL GLASS SM (MISCELLANEOUS) ×2 IMPLANT
DRAPE C-ARM FOLDED MOBILE STRL (DRAPES) ×2 IMPLANT
DURAPREP 6ML APPLICATOR 50/CS (WOUND CARE) ×2 IMPLANT
ELECT REM PT RETURN 9FT ADLT (ELECTROSURGICAL) ×2
ELECTRODE REM PT RTRN 9FT ADLT (ELECTROSURGICAL) ×1 IMPLANT
GLOVE BIOGEL PI IND STRL 7.0 (GLOVE) IMPLANT
GLOVE BIOGEL PI IND STRL 7.5 (GLOVE) ×1 IMPLANT
GLOVE BIOGEL PI IND STRL 8 (GLOVE) IMPLANT
GLOVE BIOGEL PI INDICATOR 7.0 (GLOVE) ×1
GLOVE BIOGEL PI INDICATOR 7.5 (GLOVE) ×1
GLOVE BIOGEL PI INDICATOR 8 (GLOVE) ×1
GLOVE ECLIPSE 6.5 STRL STRAW (GLOVE) ×1 IMPLANT
GLOVE ECLIPSE 7.0 STRL STRAW (GLOVE) ×2 IMPLANT
GOWN STRL REIN XL XLG (GOWN DISPOSABLE) ×4 IMPLANT
IV NS 500ML (IV SOLUTION) ×2
IV NS 500ML BAXH (IV SOLUTION) ×1 IMPLANT
KIT PORT POWER 8FR ISP MRI (CATHETERS) ×2 IMPLANT
KIT ROOM TURNOVER APOR (KITS) ×2 IMPLANT
MANIFOLD NEPTUNE II (INSTRUMENTS) ×2 IMPLANT
NDL HYPO 18GX1.5 BLUNT FILL (NEEDLE) ×1 IMPLANT
NDL HYPO 25X1 1.5 SAFETY (NEEDLE) ×1 IMPLANT
NEEDLE HYPO 18GX1.5 BLUNT FILL (NEEDLE) ×2 IMPLANT
NEEDLE HYPO 25X1 1.5 SAFETY (NEEDLE) ×2 IMPLANT
NS IRRIG 1000ML POUR BTL (IV SOLUTION) ×2 IMPLANT
PACK MINOR (CUSTOM PROCEDURE TRAY) ×2 IMPLANT
PAD ARMBOARD 7.5X6 YLW CONV (MISCELLANEOUS) ×2 IMPLANT
SET BASIN LINEN APH (SET/KITS/TRAYS/PACK) ×2 IMPLANT
SET INTRODUCER 12FR PACEMAKER (SHEATH) IMPLANT
SHEATH COOK PEEL AWAY SET 8F (SHEATH) IMPLANT
STRIP CLOSURE SKIN 1/2X4 (GAUZE/BANDAGES/DRESSINGS) ×2 IMPLANT
SUT MNCRL AB 4-0 PS2 18 (SUTURE) ×2 IMPLANT
SUT VIC AB 3-0 SH 27 (SUTURE) ×2
SUT VIC AB 3-0 SH 27X BRD (SUTURE) ×1 IMPLANT
SYR CONTROL 10ML LL (SYRINGE) ×2 IMPLANT
SYRINGE 10CC LL (SYRINGE) ×2 IMPLANT

## 2012-12-06 NOTE — Transfer of Care (Signed)
Immediate Anesthesia Transfer of Care Note  Patient: Erika Moreno  Procedure(s) Performed: Procedure(s): INSERTION PORT-A-CATH (Right)  Patient Location: PACU  Anesthesia Type:MAC  Level of Consciousness: awake, alert  and oriented  Airway & Oxygen Therapy: Patient Spontanous Breathing and Patient connected to nasal cannula oxygen  Post-op Assessment: Report given to PACU RN  Post vital signs: Reviewed and stable  Complications: No apparent anesthesia complications

## 2012-12-06 NOTE — Anesthesia Postprocedure Evaluation (Addendum)
  Anesthesia Post-op Note  Patient: Erika Moreno  Procedure(s) Performed: Procedure(s): INSERTION PORT-A-CATH (Right)  Patient Location: PACU  Anesthesia Type:MAC  Level of Consciousness: awake, alert  and oriented  Airway and Oxygen Therapy: Patient Spontanous Breathing  Post-op Pain: none  Post-op Assessment: Post-op Vital signs reviewed, Patient's Cardiovascular Status Stable, Respiratory Function Stable, Patent Airway and No signs of Nausea or vomiting  Post-op Vital Signs: Reviewed and stable 140/67, 55,17, 100, 36.5  Complications: No apparent anesthesia complications

## 2012-12-06 NOTE — Interval H&P Note (Signed)
History and Physical Interval Note:  12/06/2012 12:31 PM  Erika Moreno  has presented today for surgery, with the diagnosis of metastatic carcinoma  The various methods of treatment have been discussed with the patient and family. After consideration of risks, benefits and other options for treatment, the patient has consented to  Procedure(s): INSERTION PORT-A-CATH (N/A) as a surgical intervention .  The patient's history has been reviewed, patient examined, no change in status, stable for surgery.  I have reviewed the patient's chart and labs.  Questions were answered to the patient's satisfaction.     [place]

## 2012-12-06 NOTE — Op Note (Signed)
Patient:  Erika Moreno  DOB:  [birth year]  MRN:  [id number]   Preop Diagnosis:  Metastatic carcinoma of unknown etiology  Postop Diagnosis:  The same  Procedure:  Right clavian Port-A-Cath insertion with intraoperative fluoroscopy and surgeon interpretation  Surgeon:  Dr. Tilford Pillar  Anes:  Sedation, 1% lidocaine plain for local  Indications:  Patient is a 67 year old female with a history of metastatic cancer of unknown etiology. She's currently receiving chemotherapy and advantages of the port had been discussed with the patient with her oncologist. She was referred to our office for evaluation and risks benefits alternatives of a Port-A-Cath insertion were discussed with patient. Risk including but not limited to risk of bleeding, infection, pneumothorax were discussed with patient. Her questions and concerns are addressed the patient as consented for the planned procedure.  Procedure note:  Patient is taken to the operator says in supine position the or table time the sedation is a Optician, dispensing. This point her right neck and chest wall prepped chlorhexidine solution and draped in standard fashion. Time out was performed. Patient's placed into a reverse Trendelenburg position and the local anesthetic is instilled into the cath a planned venipuncture. An 18-gauge introducer needle was then utilized to identify the right subclavian vein. Good venous return was obtained. J-wire was advanced without difficulty was confirmed to the superior vena cava with fluoroscopy. Was clamped to the drape. The attention was then turned to the creation of the subcuticular pocket. Local anesthetic was instilled. A 10 blade scalpel utilized. The initial incision. Electrocautery and blunt digital dissection were utilized to enlarge the port site. With the port pocket created the catheter is advanced from the port to the stab incision site. This is done with a subcuticular tunneler. The insertion and dilator sheath  was advanced down the J-wire without any difficulties. This is visualize under fluoroscopy. At this point the J-wire and dilator removed in standard fashion. The catheter is then advanced down the insertion sheath. Once confirmed to the superior vena cava the insertion sheath was split and removed in standard fashion. The catheter was trimmed at 17 cm. It is attached to the port. The ports placed into the pocket and secured to the prepectoralis fascia with a 3-0 Vicryl. The port is interrogated with a Huber needle and blood return is easily obtained. The catheter and port is then flushed with 3000 units of heparin. At this point the course of the catheter is inspected with fluoroscopy. There is no sharp angulation or kinking noted. At this time the deep subcuticular tissues reapproximated using a 3-0 Vicryl and running continuous fashion. Skin edges at both stab incision and port sites were reapproximated using a 4-0 Monocryl in a running subcuticular suture. Skin was washed dried moist dry towel. Benzoin is applied around incision. Half-inch are suture placed. The drapes removed. The patient left come out of sedation and stretcher back in stable condition. At the conclusion of procedure all instrument, sponge, needle counts are correct. Patient tolerated procedure extremely well. Stat portal chest x-ray is pending.  Complications:  None apparent, chest x-ray pending  EBL:  Minimal  Specimen:  None

## 2012-12-06 NOTE — Anesthesia Preprocedure Evaluation (Addendum)
Anesthesia Evaluation  Patient identified by MRN, date of birth, ID band Patient awake    Reviewed: Allergy & Precautions, H&P , NPO status , Patient's Chart, lab work & pertinent test results  Airway Mallampati: II TM Distance: >3 FB Neck ROM: Full    Dental  (+) Edentulous Upper and Edentulous Lower   Pulmonary former smoker,  breath sounds clear to auscultation        Cardiovascular hypertension, Pt. on medications + CAD, + Past MI and + CABG Rhythm:Regular Rate:Normal     Neuro/Psych PSYCHIATRIC DISORDERS Anxiety Depression    GI/Hepatic GERD-  Medicated,  Endo/Other  Hypothyroidism   Renal/GU      Musculoskeletal  (+) Arthritis - (chronic LBP),   Abdominal   Peds  Hematology   Anesthesia Other Findings   Reproductive/Obstetrics                           Anesthesia Physical Anesthesia Plan  ASA: III  Anesthesia Plan: MAC   Post-op Pain Management:    Induction: Intravenous  Airway Management Planned: Nasal Cannula  Additional Equipment:   Intra-op Plan:   Post-operative Plan:   Informed Consent: I have reviewed the patients History and Physical, chart, labs and discussed the procedure including the risks, benefits and alternatives for the proposed anesthesia with the patient or authorized representative who has indicated his/her understanding and acceptance.     Plan Discussed with:   Anesthesia Plan Comments:         Anesthesia Quick Evaluation

## 2012-12-06 NOTE — H&P (Signed)
  NTS SOAP Note  Vital Signs:  Vitals as of: 11/28/2012: Systolic 120: Diastolic 69: Heart Rate 75: Temp 95.23F: Height 28ft 4in: Weight 148Lbs 0 Ounces: BMI 25.4  BMI : 25.4 kg/m2  Subjective: This 32 Years 61 Months old Female presents for of   cancer on unknown etiology. Patient has begun treatment with chemotherapy for peripheral access at this time. She still requires multiple courses and was referred for surgical evaluation and discussion of possible Port-A-Cath.  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 Psychiatric History: none Allergies: sulfa Medications: no current active medications   Social History:Obtained  Social History  Preferred Language: English Race:  White Ethnicity: Not Hispanic / Latino Age: 78 Years 8 Months Marital Status:  S Alcohol: none Recreational drug(s): none   Smoking Status: Never smoker reviewed on 12/04/2012 Functional Status reviewed on mm/dd/yyyy ------------------------------------------------ Bathing: Normal Cooking: Normal Dressing: Normal Driving: Normal Eating: Normal Managing Meds: Normal Oral Care: Normal Shopping: Normal Toileting: Normal Transferring: Normal Walking: Normal Cognitive Status reviewed on mm/dd/yyyy ------------------------------------------------ Attention: Normal Decision Making: Normal Language:  Normal Memory: Normal Motor: Normal Perception: Normal Problem Solving: Normal Visual and Spatial: Normal   Family History:Obtained    Family Health History Mother  Father  Other Family Member, Living; Healthy; coronary artery disease    Objective Information: General:  Well appearing, well nourished in no distress. Skin:     no rash or prominent lesions Head:Atraumatic; no masses; no abnormalities Eyes:  conjunctiva clear, EOM intact, PERRL Mouth:  Mucous membranes moist, no mucosal lesions. Neck:  Supple without lymphadenopathy.  Heart:  RRR, no murmur Lungs:    CTA bilaterally, no wheezes, rhonchi, rales.  Breathing unlabored. Abdomen:Soft, NT/ND, no HSM, no masses.  Assessment:    Plan:  cancer of unknown etiology. Surgical options discussed. Patient does wish to proceed with port insertion. We'll schedule as soon as possible.  Patient Education:Alternative treatments to surgery were discussed with patient (and family).  Risks and benefits  of procedure were fully explained to the patient (and family) who gave informed consent. Patient/family questions were addressed.  Follow-up:Pending Surgery

## 2012-12-07 ENCOUNTER — Encounter (HOSPITAL_COMMUNITY): Payer: Self-pay | Admitting: General Surgery

## 2012-12-12 ENCOUNTER — Encounter (HOSPITAL_COMMUNITY): Payer: PRIVATE HEALTH INSURANCE | Attending: Internal Medicine

## 2012-12-12 VITALS — BP 122/63 | HR 73 | Temp 98.3°F | Resp 16 | Wt 152.4 lb

## 2012-12-12 DIAGNOSIS — Z5111 Encounter for antineoplastic chemotherapy: Secondary | ICD-10-CM

## 2012-12-12 DIAGNOSIS — C801 Malignant (primary) neoplasm, unspecified: Secondary | ICD-10-CM | POA: Insufficient documentation

## 2012-12-12 LAB — CBC WITH DIFFERENTIAL/PLATELET
Basophils Absolute: 0 10*3/uL (ref 0.0–0.1)
HCT: 30 % — ABNORMAL LOW (ref 36.0–46.0)
Hemoglobin: 10 g/dL — ABNORMAL LOW (ref 12.0–15.0)
Lymphocytes Relative: 14 % (ref 12–46)
Lymphs Abs: 1 10*3/uL (ref 0.7–4.0)
MCV: 91.7 fL (ref 78.0–100.0)
Monocytes Absolute: 0.6 10*3/uL (ref 0.1–1.0)
Monocytes Relative: 8 % (ref 3–12)
Neutro Abs: 5.7 10*3/uL (ref 1.7–7.7)
RBC: 3.27 MIL/uL — ABNORMAL LOW (ref 3.87–5.11)
RDW: 20.9 % — ABNORMAL HIGH (ref 11.5–15.5)
WBC: 7.4 10*3/uL (ref 4.0–10.5)

## 2012-12-12 LAB — COMPREHENSIVE METABOLIC PANEL
AST: 19 U/L (ref 0–37)
CO2: 30 mEq/L (ref 19–32)
Chloride: 95 mEq/L — ABNORMAL LOW (ref 96–112)
Creatinine, Ser: 0.75 mg/dL (ref 0.50–1.10)
GFR calc Af Amer: >90 mL/min (ref >90)
GFR calc non Af Amer: 86 mL/min — ABNORMAL LOW (ref >90)
Glucose, Bld: 135 mg/dL — ABNORMAL HIGH (ref 70–99)
Total Bilirubin: 0.3 mg/dL (ref 0.3–1.2)

## 2012-12-12 LAB — CANCER ANTIGEN 19-9: CA 19-9: 10.3 U/mL — ABNORMAL LOW (ref <35.0)

## 2012-12-12 MED ORDER — DEXTROSE 5 % IV SOLN
175.0000 mg/m2 | Freq: Once | INTRAVENOUS | Status: AC
  Administered 2012-12-12: 306 mg via INTRAVENOUS
  Filled 2012-12-12: qty 51

## 2012-12-12 MED ORDER — LIDOCAINE-PRILOCAINE 2.5-2.5 % EX CREA: TOPICAL_CREAM | CUTANEOUS | Status: DC

## 2012-12-12 MED ORDER — DIPHENHYDRAMINE HCL 50 MG/ML IJ SOLN
50.0000 mg | Freq: Once | INTRAMUSCULAR | Status: AC
Start: 2012-12-12 — End: 2012-12-12
  Administered 2012-12-12: 50 mg via INTRAVENOUS

## 2012-12-12 MED ORDER — DEXAMETHASONE SODIUM PHOSPHATE 10 MG/ML IJ SOLN: 20.0000 mg | Freq: Once | INTRAMUSCULAR | Status: DC

## 2012-12-12 MED ORDER — SODIUM CHLORIDE 0.9 % IV SOLN
490.0000 mg | Freq: Once | INTRAVENOUS | Status: AC
  Administered 2012-12-12: 490 mg via INTRAVENOUS
  Filled 2012-12-12: qty 49

## 2012-12-12 MED ORDER — HEPARIN SOD (PORK) LOCK FLUSH 100 UNIT/ML IV SOLN
500.0000 [IU] | Freq: Once | INTRAVENOUS | Status: AC | PRN
  Administered 2012-12-12: 500 [IU]
  Filled 2012-12-12: qty 5

## 2012-12-12 MED ORDER — SODIUM CHLORIDE 0.9 % IJ SOLN
10.0000 mL | INTRAMUSCULAR | Status: DC | PRN
  Administered 2012-12-12: 10 mL
  Filled 2012-12-12: qty 10

## 2012-12-12 MED ORDER — SODIUM CHLORIDE 0.9 % IV SOLN
Freq: Once | INTRAVENOUS | Status: AC
  Administered 2012-12-12: 10:00:00 via INTRAVENOUS

## 2012-12-12 MED ORDER — SODIUM CHLORIDE 0.9 % IV SOLN: 16.0000 mg | Freq: Once | INTRAVENOUS | Status: DC

## 2012-12-12 MED ORDER — FAMOTIDINE IN NACL 20-0.9 MG/50ML-% IV SOLN
INTRAVENOUS | Status: AC
  Filled 2012-12-12: qty 50

## 2012-12-12 MED ORDER — HEPARIN SOD (PORK) LOCK FLUSH 100 UNIT/ML IV SOLN
INTRAVENOUS | Status: AC
  Filled 2012-12-12: qty 5

## 2012-12-12 MED ORDER — DIPHENHYDRAMINE HCL 50 MG/ML IJ SOLN
INTRAMUSCULAR | Status: AC
  Filled 2012-12-12: qty 1

## 2012-12-12 MED ORDER — FAMOTIDINE IN NACL 20-0.9 MG/50ML-% IV SOLN
20.0000 mg | Freq: Once | INTRAVENOUS | Status: AC
Start: 2012-12-12 — End: 2012-12-12
  Administered 2012-12-12: 20 mg via INTRAVENOUS

## 2012-12-12 MED ORDER — SODIUM CHLORIDE 0.9 % IV SOLN
Freq: Once | INTRAVENOUS | Status: AC
  Administered 2012-12-12: 16 mg via INTRAVENOUS
  Filled 2012-12-12: qty 8

## 2012-12-12 NOTE — Progress Notes (Signed)
Tolerated chemotherapy well. D/C from clinic to home accompanied by daughter.

## 2012-12-13 ENCOUNTER — Inpatient Hospital Stay (HOSPITAL_COMMUNITY): Payer: PRIVATE HEALTH INSURANCE

## 2012-12-18 ENCOUNTER — Telehealth: Payer: Self-pay | Admitting: *Deleted

## 2012-12-18 NOTE — Telephone Encounter (Signed)
Paperwork came across my desk from [place] and I called pt to see if she was wanting to come here to [facility] and she said no that she was going to [facility].  Told her that I was glad to hear and they would take great care of her.

## 2013-01-02 ENCOUNTER — Encounter (HOSPITAL_COMMUNITY): Payer: Self-pay

## 2013-01-03 ENCOUNTER — Other Ambulatory Visit (HOSPITAL_COMMUNITY): Payer: PRIVATE HEALTH INSURANCE

## 2013-01-03 ENCOUNTER — Encounter (HOSPITAL_COMMUNITY): Payer: PRIVATE HEALTH INSURANCE | Attending: Internal Medicine

## 2013-01-03 VITALS — BP 125/62 | HR 69 | Temp 97.8°F | Resp 22 | Wt 155.2 lb

## 2013-01-03 DIAGNOSIS — T451X5A Adverse effect of antineoplastic and immunosuppressive drugs, initial encounter: Secondary | ICD-10-CM | POA: Insufficient documentation

## 2013-01-03 DIAGNOSIS — C801 Malignant (primary) neoplasm, unspecified: Secondary | ICD-10-CM | POA: Insufficient documentation

## 2013-01-03 DIAGNOSIS — C770 Secondary and unspecified malignant neoplasm of lymph nodes of head, face and neck: Secondary | ICD-10-CM

## 2013-01-03 DIAGNOSIS — D649 Anemia, unspecified: Secondary | ICD-10-CM | POA: Insufficient documentation

## 2013-01-03 DIAGNOSIS — D6481 Anemia due to antineoplastic chemotherapy: Secondary | ICD-10-CM | POA: Insufficient documentation

## 2013-01-03 DIAGNOSIS — C773 Secondary and unspecified malignant neoplasm of axilla and upper limb lymph nodes: Secondary | ICD-10-CM

## 2013-01-03 DIAGNOSIS — Z5111 Encounter for antineoplastic chemotherapy: Secondary | ICD-10-CM

## 2013-01-03 LAB — COMPREHENSIVE METABOLIC PANEL
BUN: 36 mg/dL — ABNORMAL HIGH (ref 6–23)
Calcium: 9.3 mg/dL (ref 8.4–10.5)
GFR calc Af Amer: >90 mL/min (ref >90)
Glucose, Bld: 127 mg/dL — ABNORMAL HIGH (ref 70–99)
Total Protein: 6.8 g/dL (ref 6.0–8.3)

## 2013-01-03 LAB — CBC WITH DIFFERENTIAL/PLATELET
Eosinophils Absolute: 0 10*3/uL (ref 0.0–0.7)
Eosinophils Relative: 0 % (ref 0–5)
Hemoglobin: 8.7 g/dL — ABNORMAL LOW (ref 12.0–15.0)
Lymphs Abs: 1.1 10*3/uL (ref 0.7–4.0)
MCH: 31.6 pg (ref 26.0–34.0)
MCV: 94.5 fL (ref 78.0–100.0)
Monocytes Relative: 6 % (ref 3–12)
Platelets: 130 10*3/uL — ABNORMAL LOW (ref 150–400)
RBC: 2.75 MIL/uL — ABNORMAL LOW (ref 3.87–5.11)

## 2013-01-03 MED ORDER — SODIUM CHLORIDE 0.9 % IV SOLN: 16.0000 mg | Freq: Once | INTRAVENOUS | Status: DC

## 2013-01-03 MED ORDER — PACLITAXEL CHEMO INJECTION 300 MG/50ML
175.0000 mg/m2 | Freq: Once | INTRAVENOUS | Status: AC
  Administered 2013-01-03: 306 mg via INTRAVENOUS
  Filled 2013-01-03: qty 51

## 2013-01-03 MED ORDER — SODIUM CHLORIDE 0.9 % IV SOLN
Freq: Once | INTRAVENOUS | Status: AC
  Administered 2013-01-03: 16 mg via INTRAVENOUS
  Filled 2013-01-03: qty 8

## 2013-01-03 MED ORDER — FAMOTIDINE IN NACL 20-0.9 MG/50ML-% IV SOLN
INTRAVENOUS | Status: AC
  Filled 2013-01-03: qty 50

## 2013-01-03 MED ORDER — HEPARIN SOD (PORK) LOCK FLUSH 100 UNIT/ML IV SOLN
500.0000 [IU] | Freq: Once | INTRAVENOUS | Status: AC | PRN
  Administered 2013-01-03: 500 [IU]
  Filled 2013-01-03: qty 5

## 2013-01-03 MED ORDER — DIPHENHYDRAMINE HCL 50 MG/ML IJ SOLN
50.0000 mg | Freq: Once | INTRAMUSCULAR | Status: AC
  Administered 2013-01-03: 50 mg via INTRAVENOUS

## 2013-01-03 MED ORDER — ALPRAZOLAM 0.25 MG PO TABS: 0.2500 mg | ORAL_TABLET | Freq: Every evening | ORAL | Status: DC | PRN

## 2013-01-03 MED ORDER — SODIUM CHLORIDE 0.9 % IV SOLN
490.0000 mg | Freq: Once | INTRAVENOUS | Status: AC
  Administered 2013-01-03: 490 mg via INTRAVENOUS
  Filled 2013-01-03: qty 49

## 2013-01-03 MED ORDER — DIPHENHYDRAMINE HCL 50 MG/ML IJ SOLN
INTRAMUSCULAR | Status: AC
  Filled 2013-01-03: qty 1

## 2013-01-03 MED ORDER — FAMOTIDINE IN NACL 20-0.9 MG/50ML-% IV SOLN
20.0000 mg | Freq: Once | INTRAVENOUS | Status: AC
  Administered 2013-01-03: 20 mg via INTRAVENOUS

## 2013-01-03 MED ORDER — OXYCODONE HCL 10 MG PO TABS
10.0000 mg | ORAL_TABLET | ORAL | Status: DC | PRN
Start: 2013-01-03 — End: 2013-01-29

## 2013-01-03 MED ORDER — SODIUM CHLORIDE 0.9 % IV SOLN
Freq: Once | INTRAVENOUS | Status: AC
  Administered 2013-01-03: 10:00:00 via INTRAVENOUS

## 2013-01-03 MED ORDER — DEXAMETHASONE SODIUM PHOSPHATE 10 MG/ML IJ SOLN: 20.0000 mg | Freq: Once | INTRAMUSCULAR | Status: DC

## 2013-01-12 ENCOUNTER — Encounter (HOSPITAL_BASED_OUTPATIENT_CLINIC_OR_DEPARTMENT_OTHER): Payer: PRIVATE HEALTH INSURANCE

## 2013-01-12 ENCOUNTER — Ambulatory Visit (HOSPITAL_COMMUNITY)
Admission: RE | Admit: 2013-01-12 | Discharge: 2013-01-12 | Disposition: A | Discharge status: Home / Self Care | Payer: PRIVATE HEALTH INSURANCE | Source: Ambulatory Visit | Attending: Hematology and Oncology | Admitting: Hematology and Oncology

## 2013-01-12 ENCOUNTER — Ambulatory Visit (HOSPITAL_COMMUNITY): Payer: PRIVATE HEALTH INSURANCE

## 2013-01-12 ENCOUNTER — Encounter (HOSPITAL_COMMUNITY): Payer: Self-pay

## 2013-01-12 DIAGNOSIS — N9489 Other specified conditions associated with female genital organs and menstrual cycle: Secondary | ICD-10-CM | POA: Insufficient documentation

## 2013-01-12 DIAGNOSIS — C801 Malignant (primary) neoplasm, unspecified: Secondary | ICD-10-CM

## 2013-01-12 DIAGNOSIS — R599 Enlarged lymph nodes, unspecified: Secondary | ICD-10-CM | POA: Insufficient documentation

## 2013-01-12 DIAGNOSIS — C7800 Secondary malignant neoplasm of unspecified lung: Secondary | ICD-10-CM | POA: Insufficient documentation

## 2013-01-12 DIAGNOSIS — N200 Calculus of kidney: Secondary | ICD-10-CM | POA: Insufficient documentation

## 2013-01-12 DIAGNOSIS — I2510 Atherosclerotic heart disease of native coronary artery without angina pectoris: Secondary | ICD-10-CM | POA: Insufficient documentation

## 2013-01-12 DIAGNOSIS — Q762 Congenital spondylolisthesis: Secondary | ICD-10-CM | POA: Insufficient documentation

## 2013-01-12 LAB — CBC
MCHC: 34 g/dL (ref 30.0–36.0)
Platelets: 135 10*3/uL — ABNORMAL LOW (ref 150–400)
RDW: 21.1 % — ABNORMAL HIGH (ref 11.5–15.5)
WBC: 1.8 10*3/uL — ABNORMAL LOW (ref 4.0–10.5)

## 2013-01-12 MED ORDER — IOHEXOL 300 MG/ML  SOLN
100.0000 mL | Freq: Once | INTRAMUSCULAR | Status: AC | PRN
  Administered 2013-01-12: 100 mL via INTRAVENOUS

## 2013-01-12 NOTE — Progress Notes (Signed)
Labs drawn today for cbc 

## 2013-01-15 ENCOUNTER — Encounter (HOSPITAL_BASED_OUTPATIENT_CLINIC_OR_DEPARTMENT_OTHER): Payer: PRIVATE HEALTH INSURANCE

## 2013-01-15 ENCOUNTER — Encounter (HOSPITAL_COMMUNITY): Payer: Self-pay

## 2013-01-15 VITALS — BP 123/71 | HR 81 | Temp 98.4°F | Resp 18

## 2013-01-15 DIAGNOSIS — C801 Malignant (primary) neoplasm, unspecified: Secondary | ICD-10-CM

## 2013-01-15 DIAGNOSIS — G620 Drug-induced polyneuropathy: Secondary | ICD-10-CM

## 2013-01-15 DIAGNOSIS — D696 Thrombocytopenia, unspecified: Secondary | ICD-10-CM

## 2013-01-15 DIAGNOSIS — D72819 Decreased white blood cell count, unspecified: Secondary | ICD-10-CM

## 2013-01-15 DIAGNOSIS — R609 Edema, unspecified: Secondary | ICD-10-CM

## 2013-01-15 DIAGNOSIS — Z8541 Personal history of malignant neoplasm of cervix uteri: Secondary | ICD-10-CM

## 2013-01-15 DIAGNOSIS — R600 Localized edema: Secondary | ICD-10-CM

## 2013-01-15 MED ORDER — POTASSIUM CHLORIDE CRYS ER 20 MEQ PO TBCR: 20.0000 meq | EXTENDED_RELEASE_TABLET | Freq: Two times a day (BID) | ORAL | Status: DC

## 2013-01-15 MED ORDER — DULOXETINE HCL 30 MG PO CPEP: 30.0000 mg | ORAL_CAPSULE | Freq: Every day | ORAL | Status: DC

## 2013-01-15 NOTE — Patient Instructions (Signed)
[facility] Discharge Instructions  RECOMMENDATIONS MADE BY THE CONSULTANT AND ANY TEST RESULTS WILL BE SENT TO YOUR REFERRING PHYSICIAN.  EXAM FINDINGS BY THE PHYSICIAN TODAY AND SIGNS OR SYMPTOMS TO REPORT TO CLINIC OR PRIMARY PHYSICIAN: Exam and findings as discussed by Dr. Zigmund Daniel.  Your scans show that you have had a complete response.  Plan is to give you 2 more cycles of chemotherapy then re-evaluate.  Report fevers, uncontrolled nausea, vomiting or other problems.  MEDICATIONS PRESCRIBED:  Cymbalta 30 mg take 1 daily for your neuropathy Furosemide (lasix) 20 mg  take 1 tablet daily Potassium 20 meq take 1 daily with the lasix.  INSTRUCTIONS/FOLLOW-UP: Follow-up on 10/22 with MD visit and chemotherapy.  Thank you for choosing [facility] to provide your oncology and hematology care.  To afford each patient quality time with our providers, please arrive at least 15 minutes before your scheduled appointment time.  With your help, our goal is to use those 15 minutes to complete the necessary work-up to ensure our physicians have the information they need to help with your evaluation and healthcare recommendations.    Effective January 1st, 2014, we ask that you re-schedule your appointment with our physicians should you arrive 10 or more minutes late for your appointment.  We strive to give you quality time with our providers, and arriving late affects you and other patients whose appointments are after yours.    Again, thank you for choosing [facility].  Our hope is that these requests will decrease the amount of time that you wait before being seen by our physicians.       _____________________________________________________________  Should you have questions after your visit to [facility], please contact our office at [phone] between the hours of 8:30 a.m. and 5:00 p.m.  Voicemails left after 4:30 p.m. will not be  returned until the following business day.  For prescription refill requests, have your pharmacy contact our office with your prescription refill request.

## 2013-01-15 NOTE — Progress Notes (Signed)
[facility] OFFICE PROGRESS NOTE  [name], MD [address] [place] [address]  DIAGNOSIS: Cancer with unknown primary site  Edema of both legs  Chief Complaint  Patient presents with  . Follow-up    Unknown Primary, s/p chemo  . Nausea  . Edema    CURRENT THERAPY: Weekly carboplatin and Taxol for 2 cycles started on 11/15/2012 followed by 2 cycles of full dose carboplatin Taxol given every 3 weeks for 2 cycles starting on 12/12/2012 without Avastin.  INTERVAL HISTORY: Erika Moreno 67 y.o. female returns for followup after receiving most recent chemotherapy on 01/03/2013. She's had increasing swelling right leg greater than left with episode of epistaxis over the last weekend. She's had intermittent nausea but is taking in food with regular bowel movements and no diarrhea. She denies any fever, night sweats, headache, cough, wheezing, and has noticed softening of the left subclavicular area. She continues to have ecchymoses involving upper and lower extremities. 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 swelling as a result of using that H. She has been treated neo-adjuvantly since her only surgery is with a left cervical lymph node biopsy with evolving mediastinal, retroperitoneal, axillary, and abdominal masses.   MEDICAL HISTORY: Past Medical History  Diagnosis Date  . 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 Cervical cancer 1984  . Myocardial infarction 20 yrs ago  . Coronary artery disease   . Anxiety   . Chronic back pain   . Depression   . GERD (gastroesophageal reflux disease)   . Hypertension   . Hypothyroidism   . Anemia   . Vitamin B 12 deficiency   . Vitamin D deficiency disease     INTERIM HISTORY: has Cancer of unknown origin and Cancer with unknown primary site on her problem list.   67 year old womanwho was diagnosed with a cancer of  unknown primary following biopsy of left neck on 08/22/2012. Immunostains showed positivity for cytokeratin AE/AE3, cytokeratin 7, ER, and WT 1. Cytokeratin 20, S 100 mammoglobin and a CEA were negative. TTF-1 was also negative . Prior to the neck biopsy patient had the reportedly had noted lymphadenopathy in the neck lasting several months.  Neck CT dated 08/07/2012 showed Left level IV /supraclavicular mass just lateral to the thyroid and carotid sonheets. Additionally multiple malignant appearing left level IV and supraclavicular nodes were also reported. Also noted were left axillary lymph nodes the largest measuring up to 2.7 x 1.6 x 2.4 and ground glass opacity reported in the lung apices.  Subsequent CT of the chest abdomen and pelvis done 09/20/2012 showed mediastinal in the adenopathy, retroperitoneal adenopathy and cyst in the ovary.  Patient had a PET/CT scan on 10/03/12 which showed  "1. Extensive hypermetabolic adenopathy is noted within the chest,  abdomen and pelvis.  2. Cystic and solid, bilobed mass within the left adnexa is  identified. There is intense FDG uptake associated with the solid  components of this mass "  Images have been reviewed by me.  On 10/11/2012 she was started on palliative intent chemotherapy with Carb AUC of 1.5 and with 60mg /m2 Taxol weekly x3 ; then 1 week off. Additionally Aromasin 20 mg daily was added. At cycle 2 Carboplatin was changed to AUC of 2 and Avastin was added at 7.5 mg q 4 weeks.  ALLERGIES:  is allergic to morphine and related and sulfa antibiotics.  MEDICATIONS: has a current medication list which includes the following prescription(s): alprazolam, aspirin, atorvastatin, clopidogrel, cyclobenzaprine, dexamethasone, isosorbide mononitrate, lidocaine-prilocaine, lisinopril-hydrochlorothiazide, nitroglycerin, oxycodone hcl, pantoprazole, venlafaxine xr, furosemide, gabapentin, and lorazepam.  SURGICAL HISTORY:  Past Surgical History  Procedure  Laterality Date  . Heart bypass      triple bypass with stents  . Coronary artery bypass graft      3 vessels  . Cardiac catheterization      stents x3  . Abdominal hysterectomy    . Portacath placement Right 12/06/2012    Procedure: INSERTION PORT-A-CATH;  Surgeon: Fabio Bering, MD;  Location: [facility];  Service: General;  Laterality: Right;    FAMILY HISTORY: family history includes Heart attack in her father and mother.  SOCIAL HISTORY:  reports that she quit smoking about 24 years ago. Her smoking use included Cigarettes. She has a 44 pack-year smoking history. She has never used smokeless tobacco. She reports that she does not drink alcohol or use illicit drugs.  REVIEW OF SYSTEMS:  Other than that discussed above is noncontributory.  PHYSICAL EXAMINATION: ECOG PERFORMANCE STATUS: 1 - Symptomatic but completely ambulatory  Blood pressure 123/71, pulse 81, temperature 98.4 F (36.9 C), temperature source Oral, resp. rate 18.  GENERAL:alert, no distress and comfortable SKIN: skin color, texture, turgor are normal, no rashes or significant lesions. Ecchymoses on the upper extremities and lower 70s. EYES: PERLA; Conjunctiva are pink and non-injected, sclera clear OROPHARYNX:no exudate, no erythema on lips, buccal mucosa, or tongue. NECK: supple, thyroid normal size, non-tender, without nodularity. No masses CHEST: Normal AP diameter with light port in place. Breasts without masses. LYMPH:  no palpable lymphadenopathy in the cervical, axillary or inguinal. Left clavicular area has loose skin where previously enlarged lymph nodes were present. No axillary nodes are felt at this time. LUNGS: clear to auscultation and percussion with normal breathing effort HEART: regular rate & rhythm and no murmurs. ABDOMEN:abdomen soft, non-tender and normal bowel sounds MUSCULOSKELETAL:no cyanosis of digits and no clubbing. Range of motion normal. Lower extremities with +3 edema on the right and +2  edema on the left with ecchymoses. NEURO: alert & oriented x 3 with fluent speech, no focal motor/sensory deficits   LABORATORY DATA: Infusion on 01/12/2013  Component Date Value Range Status  . WBC 01/12/2013 1.8* 4.0 - 10.5 K/uL Final  . RBC 01/12/2013 2.46* 3.87 - 5.11 MIL/uL Final  . Hemoglobin 01/12/2013 8.0* 12.0 - 15.0 g/dL Final  . HCT 16/01/9603 23.5* 36.0 - 46.0 % Final  . MCV 01/12/2013 95.5  78.0 - 100.0 fL Final  . MCH 01/12/2013 32.5  26.0 - 34.0 pg Final  . MCHC 01/12/2013 34.0  30.0 - 36.0 g/dL Final  . RDW 54/12/8117 21.1* 11.5 - 15.5 % Final  . Platelets 01/12/2013 135* 150 - 400 K/uL Final  Infusion on 01/03/2013  Component Date Value Range Status  . WBC 01/03/2013 5.5  4.0 - 10.5 K/uL Final  . RBC 01/03/2013 2.75* 3.87 - 5.11 MIL/uL Final  . Hemoglobin 01/03/2013 8.7* 12.0 - 15.0 g/dL Final  . HCT 14/78/2956 26.0* 36.0 - 46.0 % Final  . MCV 01/03/2013 94.5  78.0 - 100.0 fL Final  . MCH 01/03/2013 31.6  26.0 - 34.0 pg Final  . MCHC 01/03/2013 33.5  30.0 - 36.0 g/dL Final  . RDW 21/30/8657 22.0* 11.5 - 15.5 % Final  . Platelets 01/03/2013 130* 150 - 400 K/uL Final  . Neutrophils Relative % 01/03/2013 75  43 - 77 %  Final  . Neutro Abs 01/03/2013 4.1  1.7 - 7.7 K/uL Final  . Lymphocytes Relative 01/03/2013 20  12 - 46 % Final  . Lymphs Abs 01/03/2013 1.1  0.7 - 4.0 K/uL Final  . Monocytes Relative 01/03/2013 6  3 - 12 % Final  . Monocytes Absolute 01/03/2013 0.3  0.1 - 1.0 K/uL Final  . Eosinophils Relative 01/03/2013 0  0 - 5 % Final  . Eosinophils Absolute 01/03/2013 0.0  0.0 - 0.7 K/uL Final  . Basophils Relative 01/03/2013 0  0 - 1 % Final  . Basophils Absolute 01/03/2013 0.0  0.0 - 0.1 K/uL Final  . Sodium 01/03/2013 131* 135 - 145 mEq/L Final  . Potassium 01/03/2013 4.7  3.5 - 5.1 mEq/L Final  . Chloride 01/03/2013 97  96 - 112 mEq/L Final  . CO2 01/03/2013 28  19 - 32 mEq/L Final  . Glucose, Bld 01/03/2013 127* 70 - 99 mg/dL Final  . BUN 96/07/5407 36*  6 - 23 mg/dL Final  . Creatinine, Ser 01/03/2013 0.74  0.50 - 1.10 mg/dL Final  . Calcium 81/19/1478 9.3  8.4 - 10.5 mg/dL Final  . Total Protein 01/03/2013 6.8  6.0 - 8.3 g/dL Final  . Albumin 29/56/2130 3.7  3.5 - 5.2 g/dL Final  . AST 86/57/8469 18  0 - 37 U/L Final  . ALT 01/03/2013 29  0 - 35 U/L Final  . Alkaline Phosphatase 01/03/2013 58  39 - 117 U/L Final  . Total Bilirubin 01/03/2013 0.3  0.3 - 1.2 mg/dL Final  . GFR calc non Af Amer 01/03/2013 86* >90 mL/min Final  . GFR calc Af Amer 01/03/2013 >90  >90 mL/min Final   Comment: (NOTE)                          The eGFR has been calculated using the CKD EPI equation.                          This calculation has not been validated in all clinical situations.                          eGFR's persistently <90 mL/min signify possible Chronic Kidney                          Disease.    PATHOLOGY:  Urinalysis No results found for this basename: colorurine, appearanceur, labspec, phurine, glucoseu, hgbur, bilirubinur, ketonesur, proteinur, urobilinogen, nitrite, leukocytesur    RADIOGRAPHIC STUDIES: Ct Chest W Contrast  01/12/2013   CLINICAL DATA:  The metastatic disease with no known primary.  EXAM: CT CHEST, ABDOMEN, AND PELVIS WITH CONTRAST  TECHNIQUE: Multidetector CT imaging of the chest, abdomen and pelvis was performed following the standard protocol during bolus administration of intravenous contrast.  CONTRAST:  OMNIPAQUE IOHEXOL 300 MG/ML  SOLN  COMPARISON:  PET-CT from 10/03/2012.  CT scan from 09/20/2012.  FINDINGS: CT CHEST FINDINGS  The tip of the right-sided Port-A-Cath is in the right the innominate vein, just proximal to the innominate vein confluence. There is no axillary, mediastinal, or hilar lymphadenopathy. Heart size is upper normal. Coronary artery calcification is noted. No pericardial or pleural effusion.  No pulmonary mass lesion. No focal airspace consolidation. No evidence for pulmonary edema.  CT  ABDOMEN AND PELVIS FINDINGS  No focal abnormality is seen in  the liver or spleen. The stomach is unremarkable. Duodenum diverticulae are noted. Pancreas is diffusely atrophic. Gallbladder and adrenal glands are normal in appearance. 3 mm nonobstructing stone is seen in the interpolar right kidney. Left kidney is unremarkable.  No abdominal aortic aneurysm. No free fluid in the abdomen. No abdominal lymphadenopathy.  Imaging through the pelvis shows no free intraperitoneal fluid. No pelvic sidewall lymphadenopathy. Uterus is surgically absent. No right adnexal mass. 7.8 x 5.2 cm left adnexal cystic lesion has decreased slightly in size since 09/20/2012 when it was 8.7 x 5.8 cm when remeasured in the same dimensions.  Scattered diverticular changes are seen in the left colon without diverticulitis. Terminal ileum is normal. The appendix is not visualized, but there is no edema or inflammation in the region of the cecum.  Bone windows reveal no worrisome lytic or sclerotic osseous lesions. Spondylolisthesis at L4-5 is unchanged.  IMPRESSION: Clear interval response to therapy. The relatively bulky left axillary and subpectoral adenopathy as well as the mediastinal and right hilar adenopathy seen on the previous CT scan has resolved completely. This includes .  The retroperitoneal lymphadenopathy seen on the previous CT scan has also resolved completely.  The 2 adjacent cystic lesions versus 1 bilobed cystic lesion in the left adnexal space measures slightly smaller on today's study.   Electronically Signed   By: Kennith Center M.D.   On: 01/12/2013 15:57   Ct Abdomen Pelvis W Contrast  01/12/2013   CLINICAL DATA:  The metastatic disease with no known primary.  EXAM: CT CHEST, ABDOMEN, AND PELVIS WITH CONTRAST  TECHNIQUE: Multidetector CT imaging of the chest, abdomen and pelvis was performed following the standard protocol during bolus administration of intravenous contrast.  CONTRAST:  OMNIPAQUE IOHEXOL 300  MG/ML  SOLN  COMPARISON:  PET-CT from 10/03/2012.  CT scan from 09/20/2012.  FINDINGS: CT CHEST FINDINGS  The tip of the right-sided Port-A-Cath is in the right the innominate vein, just proximal to the innominate vein confluence. There is no axillary, mediastinal, or hilar lymphadenopathy. Heart size is upper normal. Coronary artery calcification is noted. No pericardial or pleural effusion.  No pulmonary mass lesion. No focal airspace consolidation. No evidence for pulmonary edema.  CT ABDOMEN AND PELVIS FINDINGS  No focal abnormality is seen in the liver or spleen. The stomach is unremarkable. Duodenum diverticulae are noted. Pancreas is diffusely atrophic. Gallbladder and adrenal glands are normal in appearance. 3 mm nonobstructing stone is seen in the interpolar right kidney. Left kidney is unremarkable.  No abdominal aortic aneurysm. No free fluid in the abdomen. No abdominal lymphadenopathy.  Imaging through the pelvis shows no free intraperitoneal fluid. No pelvic sidewall lymphadenopathy. Uterus is surgically absent. No right adnexal mass. 7.8 x 5.2 cm left adnexal cystic lesion has decreased slightly in size since 09/20/2012 when it was 8.7 x 5.8 cm when remeasured in the same dimensions.  Scattered diverticular changes are seen in the left colon without diverticulitis. Terminal ileum is normal. The appendix is not visualized, but there is no edema or inflammation in the region of the cecum.  Bone windows reveal no worrisome lytic or sclerotic osseous lesions. Spondylolisthesis at L4-5 is unchanged.  IMPRESSION: Clear interval response to therapy. The relatively bulky left axillary and subpectoral adenopathy as well as the mediastinal and right hilar adenopathy seen on the previous CT scan has resolved completely. This includes .  The retroperitoneal lymphadenopathy seen on the previous CT scan has also resolved completely.  The 2 adjacent cystic  lesions versus 1 bilobed cystic lesion in the left adnexal  space measures slightly smaller on today's study.   Electronically Signed   By: Kennith Center M.D.   On: 01/12/2013 15:57    ASSESSMENT: #1. Metastatic adenocarcinoma, unknown primary, complete response to carboplatin and Taxol by CT scan. #2. Leukopenia and thrombocytopenia secondary to chemotherapy. #3. Neuropathy secondary to chemotherapy. #4. Lower extremity edema #5. History of cervical cancer in 1984. #6. Epistaxis due to a combination of, cytopenia plus the use of aspirin and Plavix.   PLAN: #1. Patient was started on Cymbalta and potassium supplements and was told to take Lasix daily in the morning. #2. Carboplatin and Taxol every 3 weeks he'll be continued for 2 more cycles after which a PET/CT scan will be done to assess response. #3. If a complete response is verified by PET scan, maintenance treatment will be offered with foundation one analysis to be done on original tissue only if persistent disease is found to help decide what therapy to offer.  Taxol alone out to 12 months is one possibility.. Although the latter has not been shown to increase overall survival, progression free survival has been prolonged with that regimen.   All questions were answered. The patient knows to call the clinic with any problems, questions or concerns. We can certainly see the patient much sooner if necessary.   I spent 55 minutes counseling the patient face to face. The total time spent in the appointment was 30 minutes.    Maurilio Lovely, MD 01/15/2013 3:18 PM

## 2013-01-24 ENCOUNTER — Ambulatory Visit (HOSPITAL_COMMUNITY): Payer: PRIVATE HEALTH INSURANCE

## 2013-01-24 ENCOUNTER — Encounter (HOSPITAL_COMMUNITY): Payer: Self-pay

## 2013-01-24 ENCOUNTER — Other Ambulatory Visit (HOSPITAL_COMMUNITY): Payer: Self-pay | Admitting: Hematology and Oncology

## 2013-01-24 ENCOUNTER — Encounter (HOSPITAL_BASED_OUTPATIENT_CLINIC_OR_DEPARTMENT_OTHER): Payer: PRIVATE HEALTH INSURANCE

## 2013-01-24 VITALS — BP 114/57 | HR 70 | Temp 98.1°F | Resp 18 | Wt 161.0 lb

## 2013-01-24 DIAGNOSIS — Z5111 Encounter for antineoplastic chemotherapy: Secondary | ICD-10-CM

## 2013-01-24 DIAGNOSIS — C801 Malignant (primary) neoplasm, unspecified: Secondary | ICD-10-CM

## 2013-01-24 DIAGNOSIS — D6481 Anemia due to antineoplastic chemotherapy: Secondary | ICD-10-CM

## 2013-01-24 DIAGNOSIS — T451X5A Adverse effect of antineoplastic and immunosuppressive drugs, initial encounter: Secondary | ICD-10-CM | POA: Insufficient documentation

## 2013-01-24 DIAGNOSIS — G609 Hereditary and idiopathic neuropathy, unspecified: Secondary | ICD-10-CM

## 2013-01-24 DIAGNOSIS — C778 Secondary and unspecified malignant neoplasm of lymph nodes of multiple regions: Secondary | ICD-10-CM

## 2013-01-24 LAB — CBC WITH DIFFERENTIAL/PLATELET
Basophils Relative: 0 % (ref 0–1)
Eosinophils Relative: 0 % (ref 0–5)
Hemoglobin: 7.2 g/dL — ABNORMAL LOW (ref 12.0–15.0)
Lymphocytes Relative: 27 % (ref 12–46)
Lymphs Abs: 1.6 10*3/uL (ref 0.7–4.0)
MCV: 99.1 fL (ref 78.0–100.0)
Monocytes Absolute: 0.7 10*3/uL (ref 0.1–1.0)
Neutro Abs: 3.5 10*3/uL (ref 1.7–7.7)
Neutrophils Relative %: 60 % (ref 43–77)
Platelets: 142 10*3/uL — ABNORMAL LOW (ref 150–400)
RBC: 2.19 MIL/uL — ABNORMAL LOW (ref 3.87–5.11)
WBC: 5.8 10*3/uL (ref 4.0–10.5)

## 2013-01-24 LAB — COMPREHENSIVE METABOLIC PANEL
ALT: 27 U/L (ref 0–35)
Alkaline Phosphatase: 62 U/L (ref 39–117)
CO2: 30 mEq/L (ref 19–32)
Chloride: 95 mEq/L — ABNORMAL LOW (ref 96–112)
GFR calc Af Amer: >90 mL/min (ref >90)
GFR calc non Af Amer: 85 mL/min — ABNORMAL LOW (ref >90)
Glucose, Bld: 109 mg/dL — ABNORMAL HIGH (ref 70–99)
Potassium: 4 mEq/L (ref 3.5–5.1)
Sodium: 133 mEq/L — ABNORMAL LOW (ref 135–145)
Total Bilirubin: 0.2 mg/dL — ABNORMAL LOW (ref 0.3–1.2)
Total Protein: 6.3 g/dL (ref 6.0–8.3)

## 2013-01-24 MED ORDER — FAMOTIDINE IN NACL 20-0.9 MG/50ML-% IV SOLN
20.0000 mg | Freq: Once | INTRAVENOUS | Status: AC
  Administered 2013-01-24: 20 mg via INTRAVENOUS

## 2013-01-24 MED ORDER — DIPHENHYDRAMINE HCL 50 MG/ML IJ SOLN
50.0000 mg | Freq: Once | INTRAMUSCULAR | Status: AC
  Administered 2013-01-24: 50 mg via INTRAVENOUS

## 2013-01-24 MED ORDER — SODIUM CHLORIDE 0.9 % IV SOLN
Freq: Once | INTRAVENOUS | Status: AC
  Administered 2013-01-24: 10:00:00 via INTRAVENOUS

## 2013-01-24 MED ORDER — DARBEPOETIN ALFA-POLYSORBATE 500 MCG/ML IJ SOLN
500.0000 ug | INTRAMUSCULAR | Status: DC
  Administered 2013-01-24: 500 ug via SUBCUTANEOUS

## 2013-01-24 MED ORDER — DEXLANSOPRAZOLE 30 MG PO CPDR: 30.0000 mg | DELAYED_RELEASE_CAPSULE | Freq: Every day | ORAL | Status: DC

## 2013-01-24 MED ORDER — SODIUM CHLORIDE 0.9 % IJ SOLN
10.0000 mL | INTRAMUSCULAR | Status: DC | PRN
  Administered 2013-01-24: 10 mL

## 2013-01-24 MED ORDER — SODIUM CHLORIDE 0.9 % IJ SOLN: 10.0000 mL | INTRAMUSCULAR | Status: DC | PRN

## 2013-01-24 MED ORDER — SODIUM CHLORIDE 0.9 % IV SOLN
490.0000 mg | Freq: Once | INTRAVENOUS | Status: AC
  Administered 2013-01-24: 490 mg via INTRAVENOUS
  Filled 2013-01-24: qty 49

## 2013-01-24 MED ORDER — HEPARIN SOD (PORK) LOCK FLUSH 100 UNIT/ML IV SOLN
500.0000 [IU] | Freq: Once | INTRAVENOUS | Status: AC | PRN
  Administered 2013-01-24: 500 [IU]

## 2013-01-24 MED ORDER — HEPARIN SOD (PORK) LOCK FLUSH 100 UNIT/ML IV SOLN
INTRAVENOUS | Status: AC
  Filled 2013-01-24: qty 5

## 2013-01-24 MED ORDER — FAMOTIDINE IN NACL 20-0.9 MG/50ML-% IV SOLN
INTRAVENOUS | Status: AC
  Filled 2013-01-24: qty 50

## 2013-01-24 MED ORDER — METOCLOPRAMIDE HCL 5 MG PO TABS: 5.0000 mg | ORAL_TABLET | Freq: Four times a day (QID) | ORAL | Status: DC

## 2013-01-24 MED ORDER — PACLITAXEL CHEMO INJECTION 300 MG/50ML
175.0000 mg/m2 | Freq: Once | INTRAVENOUS | Status: AC
  Administered 2013-01-24: 306 mg via INTRAVENOUS
  Filled 2013-01-24: qty 51

## 2013-01-24 MED ORDER — SODIUM CHLORIDE 0.9 % IV SOLN: 16.0000 mg | Freq: Once | INTRAVENOUS | Status: DC

## 2013-01-24 MED ORDER — DIPHENHYDRAMINE HCL 50 MG/ML IJ SOLN
INTRAMUSCULAR | Status: AC
  Filled 2013-01-24: qty 1

## 2013-01-24 MED ORDER — SODIUM CHLORIDE 0.9 % IV SOLN: 250.0000 mL | Freq: Once | INTRAVENOUS | Status: DC

## 2013-01-24 MED ORDER — SODIUM CHLORIDE 0.9 % IV SOLN
Freq: Once | INTRAVENOUS | Status: AC
  Administered 2013-01-24: 16 mg via INTRAVENOUS
  Filled 2013-01-24: qty 8

## 2013-01-24 MED ORDER — DARBEPOETIN ALFA-POLYSORBATE 500 MCG/ML IJ SOLN
INTRAMUSCULAR | Status: AC
  Filled 2013-01-24: qty 1

## 2013-01-24 MED ORDER — DEXAMETHASONE SODIUM PHOSPHATE 10 MG/ML IJ SOLN: 20.0000 mg | Freq: Once | INTRAMUSCULAR | Status: DC

## 2013-01-24 NOTE — Progress Notes (Signed)
Patient is taking Pantoprazole and Plavix.  Efficacy of Plavix is compromised because of in addition of the enzyme that activates Plavix in vivo. For that reason pantoprazole will be discontinued and Dexilant 30 mg daily will be substituted.

## 2013-01-24 NOTE — Progress Notes (Signed)
Tolerated chemo well today.  To return tomorrow for prbc transfusion. Port left accessed after flushing with heparin 500 units.  Drsg reinforced with hypafix drsg.

## 2013-01-24 NOTE — Addendum Note (Signed)
Addended by: Alla German A on: 01/24/2013 11:50 AM   Modules accepted: Orders

## 2013-01-24 NOTE — Progress Notes (Signed)
[facility] OFFICE PROGRESS NOTE  [name], MD [address] [place] [address]  DIAGNOSIS: Cancer with unknown primary site  Chief Complaint  Patient presents with  . Chemotherapy    Unknown Primary adenoCA    CURRENT THERAPY: Carboplatin and Taxol every 3 weeks to receive cycle #3 today with demonstration of complete clinical response by CAT scan after first 2 cycles preceded by 2 weekly treatments with carboplatin and Taxol.. Treatment was initiated in neoadjuvant mode with cervical lymph node biopsy to make the diagnosis.  INTERVAL HISTORY: Erika Moreno 67 y.o. female returns for continuation of chemotherapy for unknown primary adenocarcinoma presenting with abdominal disease, complete response by CT scan to first 2 full cycles of carboplatin and Taxol preceded by 2 weekly treatments with carboplatin and Taxol. Lower extremity paresthesias have improved on Cymbalta. She is sleeping better. Lower 70 swelling has decreased with no recurrence of her abdominal swelling. She denies any chest pain, PND, orthopnea, palpitations, sore mouth, rare episodes of diarrhea without melena, hematochezia, hematuria, or vaginal bleeding. Appetite is good. She continues on dexamethasone 4 mg twice a day as antinauseant.    MEDICAL HISTORY: Past Medical History  Diagnosis Date  . 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 Cervical cancer 1984  . Myocardial infarction 20 yrs ago  . Coronary artery disease   . Anxiety   . Chronic back pain   . Depression   . GERD (gastroesophageal reflux disease)   . Hypertension   . Hypothyroidism   . Anemia   . Vitamin B 12 deficiency   . Vitamin D deficiency disease     INTERIM HISTORY: has Cancer of unknown origin and Cancer with unknown primary site on her problem list.   Weekly carboplatin and Taxol for 2 cycles started on 11/15/2012 followed by 2 cycles of full dose carboplatin  Taxol given every 3 weeks for 2 cycles starting on 12/12/2012 without Avastin  ALLERGIES:  is allergic to morphine and related and sulfa antibiotics.  MEDICATIONS: has a current medication list which includes the following prescription(s): alprazolam, aspirin, atorvastatin, clopidogrel, cyclobenzaprine, dexamethasone, duloxetine, furosemide, isosorbide mononitrate, lidocaine-prilocaine, lisinopril-hydrochlorothiazide, lorazepam, nitroglycerin, oxycodone hcl, pantoprazole, potassium chloride sa, and metoclopramide.  SURGICAL HISTORY:  Past Surgical History  Procedure Laterality Date  . Heart bypass      triple bypass with stents  . Coronary artery bypass graft      3 vessels  . Cardiac catheterization      stents x3  . Abdominal hysterectomy    . Portacath placement Right 12/06/2012    Procedure: INSERTION PORT-A-CATH;  Surgeon: Fabio Bering, MD;  Location: [facility];  Service: General;  Laterality: Right;    FAMILY HISTORY: family history includes Heart attack in her father and mother.  SOCIAL HISTORY:  reports that she quit smoking about 24 years ago. Her smoking use included Cigarettes. She has a 44 pack-year smoking history. She has never used smokeless tobacco. She reports that she does not drink alcohol or use illicit drugs.  REVIEW OF SYSTEMS:  Other than that discussed above is noncontributory.  PHYSICAL EXAMINATION: ECOG PERFORMANCE STATUS: 1 - Symptomatic but completely ambulatory  Blood pressure 114/57, pulse 70, temperature 98.1 F (36.7 C), temperature source Oral, resp. rate 18, weight 161 lb (73.029 kg).  GENERAL:alert, no distress and comfortable SKIN: skin color, texture, turgor are normal, no rashes or significant lesions EYES: PERLA; Conjunctiva are pink  and non-injected, sclera clear OROPHARYNX:no exudate, no erythema on lips, buccal mucosa, or tongue. NECK: supple, thyroid normal size, non-tender, without nodularity. No masses. Biopsy site is well-healed with no  evidence of regrowth in the left supraclavicular area. CHEST: Midline sternotomy scar is well-healed with light port in place. No breast masses. LYMPH:  no palpable lymphadenopathy in the cervical, axillary or inguinal LUNGS: clear to auscultation and percussion with normal breathing effort HEART: regular rate & rhythm and no murmurs and no lower extremity edema ABDOMEN:abdomen soft, non-tender and normal bowel sounds. No free fluid wave or shifting dullness. No CVA tenderness. MUSCULOSKELETAL:no cyanosis of digits and no clubbing. Range of motion normal.  NEURO: alert & oriented x 3 with fluent speech, no focal motor/sensory deficits   LABORATORY DATA: Appointment on 01/24/2013  Component Date Value Range Status  . WBC 01/24/2013 5.8  4.0 - 10.5 K/uL Final  . RBC 01/24/2013 2.19* 3.87 - 5.11 MIL/uL Final  . Hemoglobin 01/24/2013 7.2* 12.0 - 15.0 g/dL Final  . HCT 16/01/9603 21.7* 36.0 - 46.0 % Final  . MCV 01/24/2013 99.1  78.0 - 100.0 fL Final  . MCH 01/24/2013 32.9  26.0 - 34.0 pg Final  . MCHC 01/24/2013 33.2  30.0 - 36.0 g/dL Final  . RDW 54/12/8117 22.6* 11.5 - 15.5 % Final  . Platelets 01/24/2013 142* 150 - 400 K/uL Final  . Neutrophils Relative % 01/24/2013 60  43 - 77 % Final  . Neutro Abs 01/24/2013 3.5  1.7 - 7.7 K/uL Final  . Lymphocytes Relative 01/24/2013 27  12 - 46 % Final  . Lymphs Abs 01/24/2013 1.6  0.7 - 4.0 K/uL Final  . Monocytes Relative 01/24/2013 13* 3 - 12 % Final  . Monocytes Absolute 01/24/2013 0.7  0.1 - 1.0 K/uL Final  . Eosinophils Relative 01/24/2013 0  0 - 5 % Final  . Eosinophils Absolute 01/24/2013 0.0  0.0 - 0.7 K/uL Final  . Basophils Relative 01/24/2013 0  0 - 1 % Final  . Basophils Absolute 01/24/2013 0.0  0.0 - 0.1 K/uL Final  Infusion on 01/12/2013  Component Date Value Range Status  . WBC 01/12/2013 1.8* 4.0 - 10.5 K/uL Final  . RBC 01/12/2013 2.46* 3.87 - 5.11 MIL/uL Final  . Hemoglobin 01/12/2013 8.0* 12.0 - 15.0 g/dL Final  . HCT  14/78/2956 23.5* 36.0 - 46.0 % Final  . MCV 01/12/2013 95.5  78.0 - 100.0 fL Final  . MCH 01/12/2013 32.5  26.0 - 34.0 pg Final  . MCHC 01/12/2013 34.0  30.0 - 36.0 g/dL Final  . RDW 21/30/8657 21.1* 11.5 - 15.5 % Final  . Platelets 01/12/2013 135* 150 - 400 K/uL Final  Infusion on 01/03/2013  Component Date Value Range Status  . WBC 01/03/2013 5.5  4.0 - 10.5 K/uL Final  . RBC 01/03/2013 2.75* 3.87 - 5.11 MIL/uL Final  . Hemoglobin 01/03/2013 8.7* 12.0 - 15.0 g/dL Final  . HCT 84/69/6295 26.0* 36.0 - 46.0 % Final  . MCV 01/03/2013 94.5  78.0 - 100.0 fL Final  . MCH 01/03/2013 31.6  26.0 - 34.0 pg Final  . MCHC 01/03/2013 33.5  30.0 - 36.0 g/dL Final  . RDW 28/41/3244 22.0* 11.5 - 15.5 % Final  . Platelets 01/03/2013 130* 150 - 400 K/uL Final  . Neutrophils Relative % 01/03/2013 75  43 - 77 % Final  . Neutro Abs 01/03/2013 4.1  1.7 - 7.7 K/uL Final  . Lymphocytes Relative 01/03/2013 20  12 - 46 %  Final  . Lymphs Abs 01/03/2013 1.1  0.7 - 4.0 K/uL Final  . Monocytes Relative 01/03/2013 6  3 - 12 % Final  . Monocytes Absolute 01/03/2013 0.3  0.1 - 1.0 K/uL Final  . Eosinophils Relative 01/03/2013 0  0 - 5 % Final  . Eosinophils Absolute 01/03/2013 0.0  0.0 - 0.7 K/uL Final  . Basophils Relative 01/03/2013 0  0 - 1 % Final  . Basophils Absolute 01/03/2013 0.0  0.0 - 0.1 K/uL Final  . Sodium 01/03/2013 131* 135 - 145 mEq/L Final  . Potassium 01/03/2013 4.7  3.5 - 5.1 mEq/L Final  . Chloride 01/03/2013 97  96 - 112 mEq/L Final  . CO2 01/03/2013 28  19 - 32 mEq/L Final  . Glucose, Bld 01/03/2013 127* 70 - 99 mg/dL Final  . BUN 69/62/9528 36* 6 - 23 mg/dL Final  . Creatinine, Ser 01/03/2013 0.74  0.50 - 1.10 mg/dL Final  . Calcium 41/32/4401 9.3  8.4 - 10.5 mg/dL Final  . Total Protein 01/03/2013 6.8  6.0 - 8.3 g/dL Final  . Albumin 02/72/5366 3.7  3.5 - 5.2 g/dL Final  . AST 44/06/4740 18  0 - 37 U/L Final  . ALT 01/03/2013 29  0 - 35 U/L Final  . Alkaline Phosphatase 01/03/2013 58   39 - 117 U/L Final  . Total Bilirubin 01/03/2013 0.3  0.3 - 1.2 mg/dL Final  . GFR calc non Af Amer 01/03/2013 86* >90 mL/min Final  . GFR calc Af Amer 01/03/2013 >90  >90 mL/min Final   Comment: (NOTE)                          The eGFR has been calculated using the CKD EPI equation.                          This calculation has not been validated in all clinical situations.                          eGFR's persistently <90 mL/min signify possible Chronic Kidney                          Disease.    PATHOLOGY:  Urinalysis No results found for this basename: colorurine,  appearanceur,  labspec,  phurine,  glucoseu,  hgbur,  bilirubinur,  ketonesur,  proteinur,  urobilinogen,  nitrite,  leukocytesur    RADIOGRAPHIC STUDIES: Ct Chest W Contrast  01/12/2013   CLINICAL DATA:  The metastatic disease with no known primary.  EXAM: CT CHEST, ABDOMEN, AND PELVIS WITH CONTRAST  TECHNIQUE: Multidetector CT imaging of the chest, abdomen and pelvis was performed following the standard protocol during bolus administration of intravenous contrast.  CONTRAST:  OMNIPAQUE IOHEXOL 300 MG/ML  SOLN  COMPARISON:  PET-CT from 10/03/2012.  CT scan from 09/20/2012.  FINDINGS: CT CHEST FINDINGS  The tip of the right-sided Port-A-Cath is in the right the innominate vein, just proximal to the innominate vein confluence. There is no axillary, mediastinal, or hilar lymphadenopathy. Heart size is upper normal. Coronary artery calcification is noted. No pericardial or pleural effusion.  No pulmonary mass lesion. No focal airspace consolidation. No evidence for pulmonary edema.  CT ABDOMEN AND PELVIS FINDINGS  No focal abnormality is seen in the liver or spleen. The stomach is unremarkable. Duodenum diverticulae are noted. Pancreas is  diffusely atrophic. Gallbladder and adrenal glands are normal in appearance. 3 mm nonobstructing stone is seen in the interpolar right kidney. Left kidney is unremarkable.  No abdominal aortic  aneurysm. No free fluid in the abdomen. No abdominal lymphadenopathy.  Imaging through the pelvis shows no free intraperitoneal fluid. No pelvic sidewall lymphadenopathy. Uterus is surgically absent. No right adnexal mass. 7.8 x 5.2 cm left adnexal cystic lesion has decreased slightly in size since 09/20/2012 when it was 8.7 x 5.8 cm when remeasured in the same dimensions.  Scattered diverticular changes are seen in the left colon without diverticulitis. Terminal ileum is normal. The appendix is not visualized, but there is no edema or inflammation in the region of the cecum.  Bone windows reveal no worrisome lytic or sclerotic osseous lesions. Spondylolisthesis at L4-5 is unchanged.  IMPRESSION: Clear interval response to therapy. The relatively bulky left axillary and subpectoral adenopathy as well as the mediastinal and right hilar adenopathy seen on the previous CT scan has resolved completely. This includes .  The retroperitoneal lymphadenopathy seen on the previous CT scan has also resolved completely.  The 2 adjacent cystic lesions versus 1 bilobed cystic lesion in the left adnexal space measures slightly smaller on today's study.   Electronically Signed   By: Kennith Center M.D.   On: 01/12/2013 15:57   Ct Abdomen Pelvis W Contrast  01/12/2013   CLINICAL DATA:  The metastatic disease with no known primary.  EXAM: CT CHEST, ABDOMEN, AND PELVIS WITH CONTRAST  TECHNIQUE: Multidetector CT imaging of the chest, abdomen and pelvis was performed following the standard protocol during bolus administration of intravenous contrast.  CONTRAST:  OMNIPAQUE IOHEXOL 300 MG/ML  SOLN  COMPARISON:  PET-CT from 10/03/2012.  CT scan from 09/20/2012.  FINDINGS: CT CHEST FINDINGS  The tip of the right-sided Port-A-Cath is in the right the innominate vein, just proximal to the innominate vein confluence. There is no axillary, mediastinal, or hilar lymphadenopathy. Heart size is upper normal. Coronary artery calcification  is noted. No pericardial or pleural effusion.  No pulmonary mass lesion. No focal airspace consolidation. No evidence for pulmonary edema.  CT ABDOMEN AND PELVIS FINDINGS  No focal abnormality is seen in the liver or spleen. The stomach is unremarkable. Duodenum diverticulae are noted. Pancreas is diffusely atrophic. Gallbladder and adrenal glands are normal in appearance. 3 mm nonobstructing stone is seen in the interpolar right kidney. Left kidney is unremarkable.  No abdominal aortic aneurysm. No free fluid in the abdomen. No abdominal lymphadenopathy.  Imaging through the pelvis shows no free intraperitoneal fluid. No pelvic sidewall lymphadenopathy. Uterus is surgically absent. No right adnexal mass. 7.8 x 5.2 cm left adnexal cystic lesion has decreased slightly in size since 09/20/2012 when it was 8.7 x 5.8 cm when remeasured in the same dimensions.  Scattered diverticular changes are seen in the left colon without diverticulitis. Terminal ileum is normal. The appendix is not visualized, but there is no edema or inflammation in the region of the cecum.  Bone windows reveal no worrisome lytic or sclerotic osseous lesions. Spondylolisthesis at L4-5 is unchanged.  IMPRESSION: Clear interval response to therapy. The relatively bulky left axillary and subpectoral adenopathy as well as the mediastinal and right hilar adenopathy seen on the previous CT scan has resolved completely. This includes .  The retroperitoneal lymphadenopathy seen on the previous CT scan has also resolved completely.  The 2 adjacent cystic lesions versus 1 bilobed cystic lesion in the left adnexal space measures slightly  smaller on today's study.   Electronically Signed   By: Kennith Center M.D.   On: 01/12/2013 15:57    ASSESSMENT: #1. Unknown primary adenocarcinoma, stage IV, please response to carboplatin and Taxol. To receive cycle #3 today. #2. Peripheral neuropathy, improved on Cymbalta. #3. Lower 70 edema, improved on regular use  of furosemide plus potassium supplements. #4. Remote history of cervical cancer, no evidence of disease. #5. Anemia secondary chemotherapy with no gross evidence of bleeding.   PLAN:  #1. Carboplatin and Taxol IV today. #2. Patient was told to continue her current medications except to decrease dexamethasone to 4 mg daily over the next week and then to discontinue it. #3. Metoclopramide 10 mg 3 times a day before meals for delayed nausea #4. Aranesp 500 mcg subcutaneously every 3 weeks if hemoglobin less than 10. #5. Transfuse 2 units of packed red blood cells today. #6. Return in 3 weeks to receive cycle #4 of therapy.   All questions were answered. The patient knows to call the clinic with any problems, questions or concerns. We can certainly see the patient much sooner if necessary.   I spent 25 minutes counseling the patient face to face. The total time spent in the appointment was 30 minutes.    Maurilio Lovely, MD 01/24/2013 9:28 AM

## 2013-01-25 ENCOUNTER — Other Ambulatory Visit (HOSPITAL_COMMUNITY): Payer: Self-pay | Admitting: Hematology and Oncology

## 2013-01-25 ENCOUNTER — Encounter (HOSPITAL_BASED_OUTPATIENT_CLINIC_OR_DEPARTMENT_OTHER): Payer: PRIVATE HEALTH INSURANCE

## 2013-01-25 VITALS — BP 99/56 | HR 84 | Temp 100.1°F | Resp 18

## 2013-01-25 DIAGNOSIS — D649 Anemia, unspecified: Secondary | ICD-10-CM

## 2013-01-25 LAB — TYPE AND SCREEN
ABO/RH(D): A POS
Unit division: 0
Unit division: 0

## 2013-01-25 LAB — PREPARE RBC (CROSSMATCH)

## 2013-01-25 MED ORDER — SODIUM CHLORIDE 0.9 % IJ SOLN
10.0000 mL | INTRAMUSCULAR | Status: AC | PRN
  Administered 2013-01-25: 10 mL

## 2013-01-25 MED ORDER — HEPARIN SOD (PORK) LOCK FLUSH 100 UNIT/ML IV SOLN
INTRAVENOUS | Status: AC
  Filled 2013-01-25: qty 5

## 2013-01-25 MED ORDER — ACETAMINOPHEN 325 MG PO TABS
ORAL_TABLET | ORAL | Status: AC
  Filled 2013-01-25: qty 2

## 2013-01-25 MED ORDER — ACETAMINOPHEN 325 MG PO TABS
650.0000 mg | ORAL_TABLET | Freq: Once | ORAL | Status: AC
  Administered 2013-01-25: 650 mg via ORAL

## 2013-01-25 MED ORDER — SODIUM CHLORIDE 0.9 % IV SOLN
250.0000 mL | Freq: Once | INTRAVENOUS | Status: AC
  Administered 2013-01-25: 10:00:00 via INTRAVENOUS

## 2013-01-25 MED ORDER — PROCHLORPERAZINE EDISYLATE 5 MG/ML IJ SOLN
10.0000 mg | INTRAMUSCULAR | Status: DC | PRN
  Filled 2013-01-25: qty 2

## 2013-01-25 MED ORDER — PROCHLORPERAZINE EDISYLATE 5 MG/ML IJ SOLN
10.0000 mg | Freq: Once | INTRAMUSCULAR | Status: AC
  Administered 2013-01-25: 10 mg via INTRAVENOUS

## 2013-01-25 NOTE — Progress Notes (Signed)
Received 2 units of prbc's.  Responded well to IV Compazine 10 mg.   No further c/o nausea. Tylenol.  650 mg given at 1230 by Loma Newton, RN for increased fever.  Temperature continues to stay right around 100 degrees.  Pt does not have any c/o discomfort.  Transfusion complete at approx 1400.  Instructions given on use of tylenol at home and need to  Check temperature at home. Voiced understanding.  Home accompanied by daughter.

## 2013-01-26 ENCOUNTER — Telehealth (HOSPITAL_COMMUNITY): Payer: Self-pay | Admitting: *Deleted

## 2013-01-26 LAB — TYPE AND SCREEN
Unit division: 0
Unit division: 0

## 2013-01-26 NOTE — Telephone Encounter (Signed)
Message copied by Evelena Leyden on Fri Jan 26, 2013  4:34 PM ------      Message from: Alla German A      Created: Fri Jan 26, 2013  4:24 PM       Dexilant too expensive for patient.  Suggest Zantac 150mg  twice a day which will be compatible with Plavix.  Available OTC. Dr.F ------

## 2013-01-26 NOTE — Telephone Encounter (Signed)
Patient notified and verbalized understanding of instructions. 

## 2013-01-29 ENCOUNTER — Telehealth (HOSPITAL_COMMUNITY): Payer: Self-pay | Admitting: *Deleted

## 2013-01-29 ENCOUNTER — Other Ambulatory Visit (HOSPITAL_COMMUNITY): Payer: Self-pay | Admitting: Oncology

## 2013-01-29 DIAGNOSIS — C801 Malignant (primary) neoplasm, unspecified: Secondary | ICD-10-CM

## 2013-01-29 MED ORDER — OXYCODONE HCL 10 MG PO TABS: 10.0000 mg | ORAL_TABLET | ORAL | Status: DC | PRN

## 2013-01-29 NOTE — Telephone Encounter (Signed)
Rx ready and in drawer

## 2013-02-08 ENCOUNTER — Other Ambulatory Visit: Payer: Self-pay

## 2013-02-10 ENCOUNTER — Inpatient Hospital Stay (HOSPITAL_COMMUNITY)
Admission: EM | Admit: 2013-02-10 | Discharge: 2013-02-12 | DRG: 809 | Disposition: A | Discharge status: Home / Self Care | Payer: PRIVATE HEALTH INSURANCE | Attending: Internal Medicine | Admitting: Internal Medicine

## 2013-02-10 ENCOUNTER — Encounter (HOSPITAL_COMMUNITY): Payer: Self-pay | Admitting: Emergency Medicine

## 2013-02-10 ENCOUNTER — Emergency Department (HOSPITAL_COMMUNITY): Payer: PRIVATE HEALTH INSURANCE

## 2013-02-10 DIAGNOSIS — I2510 Atherosclerotic heart disease of native coronary artery without angina pectoris: Secondary | ICD-10-CM | POA: Diagnosis present

## 2013-02-10 DIAGNOSIS — D6481 Anemia due to antineoplastic chemotherapy: Secondary | ICD-10-CM | POA: Diagnosis present

## 2013-02-10 DIAGNOSIS — Z8541 Personal history of malignant neoplasm of cervix uteri: Secondary | ICD-10-CM

## 2013-02-10 DIAGNOSIS — E871 Hypo-osmolality and hyponatremia: Secondary | ICD-10-CM | POA: Diagnosis present

## 2013-02-10 DIAGNOSIS — Z951 Presence of aortocoronary bypass graft: Secondary | ICD-10-CM

## 2013-02-10 DIAGNOSIS — G620 Drug-induced polyneuropathy: Secondary | ICD-10-CM | POA: Diagnosis present

## 2013-02-10 DIAGNOSIS — G8929 Other chronic pain: Secondary | ICD-10-CM | POA: Diagnosis present

## 2013-02-10 DIAGNOSIS — E538 Deficiency of other specified B group vitamins: Secondary | ICD-10-CM | POA: Diagnosis present

## 2013-02-10 DIAGNOSIS — Z7902 Long term (current) use of antithrombotics/antiplatelets: Secondary | ICD-10-CM

## 2013-02-10 DIAGNOSIS — R040 Epistaxis: Secondary | ICD-10-CM | POA: Diagnosis present

## 2013-02-10 DIAGNOSIS — Z9221 Personal history of antineoplastic chemotherapy: Secondary | ICD-10-CM

## 2013-02-10 DIAGNOSIS — K219 Gastro-esophageal reflux disease without esophagitis: Secondary | ICD-10-CM | POA: Diagnosis present

## 2013-02-10 DIAGNOSIS — Z87891 Personal history of nicotine dependence: Secondary | ICD-10-CM

## 2013-02-10 DIAGNOSIS — Z79899 Other long term (current) drug therapy: Secondary | ICD-10-CM

## 2013-02-10 DIAGNOSIS — F3289 Other specified depressive episodes: Secondary | ICD-10-CM | POA: Diagnosis present

## 2013-02-10 DIAGNOSIS — E559 Vitamin D deficiency, unspecified: Secondary | ICD-10-CM | POA: Diagnosis present

## 2013-02-10 DIAGNOSIS — N390 Urinary tract infection, site not specified: Secondary | ICD-10-CM

## 2013-02-10 DIAGNOSIS — E039 Hypothyroidism, unspecified: Secondary | ICD-10-CM | POA: Diagnosis present

## 2013-02-10 DIAGNOSIS — I8000 Phlebitis and thrombophlebitis of superficial vessels of unspecified lower extremity: Secondary | ICD-10-CM

## 2013-02-10 DIAGNOSIS — T451X5A Adverse effect of antineoplastic and immunosuppressive drugs, initial encounter: Principal | ICD-10-CM | POA: Diagnosis present

## 2013-02-10 DIAGNOSIS — Z8249 Family history of ischemic heart disease and other diseases of the circulatory system: Secondary | ICD-10-CM

## 2013-02-10 DIAGNOSIS — F329 Major depressive disorder, single episode, unspecified: Secondary | ICD-10-CM | POA: Diagnosis present

## 2013-02-10 DIAGNOSIS — I10 Essential (primary) hypertension: Secondary | ICD-10-CM | POA: Diagnosis present

## 2013-02-10 DIAGNOSIS — I252 Old myocardial infarction: Secondary | ICD-10-CM

## 2013-02-10 DIAGNOSIS — F411 Generalized anxiety disorder: Secondary | ICD-10-CM | POA: Diagnosis present

## 2013-02-10 DIAGNOSIS — D649 Anemia, unspecified: Secondary | ICD-10-CM | POA: Diagnosis present

## 2013-02-10 DIAGNOSIS — R0609 Other forms of dyspnea: Secondary | ICD-10-CM | POA: Diagnosis present

## 2013-02-10 DIAGNOSIS — C801 Malignant (primary) neoplasm, unspecified: Secondary | ICD-10-CM | POA: Diagnosis present

## 2013-02-10 DIAGNOSIS — J019 Acute sinusitis, unspecified: Secondary | ICD-10-CM

## 2013-02-10 DIAGNOSIS — D61810 Antineoplastic chemotherapy induced pancytopenia: Secondary | ICD-10-CM | POA: Diagnosis present

## 2013-02-10 LAB — URINE MICROSCOPIC-ADD ON

## 2013-02-10 LAB — COMPREHENSIVE METABOLIC PANEL
ALT: 19 U/L (ref 0–35)
AST: 24 U/L (ref 0–37)
BUN: 22 mg/dL (ref 6–23)
CO2: 25 mEq/L (ref 19–32)
Calcium: 9.3 mg/dL (ref 8.4–10.5)
Chloride: 93 mEq/L — ABNORMAL LOW (ref 96–112)
GFR calc non Af Amer: 75 mL/min — ABNORMAL LOW (ref >90)
Glucose, Bld: 115 mg/dL — ABNORMAL HIGH (ref 70–99)
Potassium: 3.7 mEq/L (ref 3.5–5.1)
Sodium: 128 mEq/L — ABNORMAL LOW (ref 135–145)
Total Bilirubin: 0.6 mg/dL (ref 0.3–1.2)

## 2013-02-10 LAB — URINALYSIS, ROUTINE W REFLEX MICROSCOPIC
Glucose, UA: NEGATIVE mg/dL
Leukocytes, UA: NEGATIVE
Nitrite: POSITIVE — AB
Protein, ur: NEGATIVE mg/dL

## 2013-02-10 LAB — CBC WITH DIFFERENTIAL/PLATELET
Basophils Absolute: 0 10*3/uL (ref 0.0–0.1)
Hemoglobin: 6.1 g/dL — CL (ref 12.0–15.0)
Lymphocytes Relative: 39 % (ref 12–46)
Lymphs Abs: 1.5 10*3/uL (ref 0.7–4.0)
MCH: 31.6 pg (ref 26.0–34.0)
Monocytes Relative: 16 % — ABNORMAL HIGH (ref 3–12)
Neutro Abs: 1.6 10*3/uL — ABNORMAL LOW (ref 1.7–7.7)
Platelets: 60 10*3/uL — ABNORMAL LOW (ref 150–400)
RDW: 21.3 % — ABNORMAL HIGH (ref 11.5–15.5)
WBC: 3.7 10*3/uL — ABNORMAL LOW (ref 4.0–10.5)

## 2013-02-10 LAB — PREPARE RBC (CROSSMATCH)

## 2013-02-10 LAB — TROPONIN I: Troponin I: <0.3 ng/mL (ref <0.30)

## 2013-02-10 LAB — PRO B NATRIURETIC PEPTIDE: Pro B Natriuretic peptide (BNP): 193.2 pg/mL — ABNORMAL HIGH (ref 0–125)

## 2013-02-10 MED ORDER — SODIUM CHLORIDE 0.9 % IV SOLN
INTRAVENOUS | Status: DC
  Administered 2013-02-10: 17:00:00 via INTRAVENOUS

## 2013-02-10 MED ORDER — FLUTICASONE PROPIONATE 50 MCG/ACT NA SUSP
1.0000 | Freq: Every day | NASAL | Status: DC
Start: 2013-02-10 — End: 2013-02-12
  Administered 2013-02-10 – 2013-02-12 (×2): 1 via NASAL
  Filled 2013-02-10: qty 16

## 2013-02-10 MED ORDER — ONDANSETRON HCL 4 MG/2ML IJ SOLN: 4.0000 mg | Freq: Four times a day (QID) | INTRAMUSCULAR | Status: DC | PRN

## 2013-02-10 MED ORDER — LORAZEPAM 1 MG PO TABS: 1.0000 mg | ORAL_TABLET | Freq: Three times a day (TID) | ORAL | Status: DC | PRN

## 2013-02-10 MED ORDER — OXYCODONE HCL 10 MG PO TABS: 10.0000 mg | ORAL_TABLET | ORAL | Status: DC | PRN

## 2013-02-10 MED ORDER — ACETAMINOPHEN 325 MG PO TABS
650.0000 mg | ORAL_TABLET | Freq: Four times a day (QID) | ORAL | Status: DC | PRN
  Administered 2013-02-11: 650 mg via ORAL
  Filled 2013-02-10: qty 2

## 2013-02-10 MED ORDER — PANTOPRAZOLE SODIUM 40 MG PO TBEC: 40.0000 mg | DELAYED_RELEASE_TABLET | Freq: Every day | ORAL | Status: DC

## 2013-02-10 MED ORDER — ONDANSETRON HCL 4 MG PO TABS: 4.0000 mg | ORAL_TABLET | Freq: Four times a day (QID) | ORAL | Status: DC | PRN

## 2013-02-10 MED ORDER — VANCOMYCIN HCL IN DEXTROSE 1-5 GM/200ML-% IV SOLN
1000.0000 mg | Freq: Two times a day (BID) | INTRAVENOUS | Status: DC
  Administered 2013-02-11 (×3): 1000 mg via INTRAVENOUS
  Filled 2013-02-10 (×4): qty 200

## 2013-02-10 MED ORDER — FUROSEMIDE 10 MG/ML IJ SOLN
20.0000 mg | Freq: Once | INTRAMUSCULAR | Status: AC
  Administered 2013-02-10: 20 mg via INTRAVENOUS
  Filled 2013-02-10: qty 2

## 2013-02-10 MED ORDER — CYCLOBENZAPRINE HCL 10 MG PO TABS: 10.0000 mg | ORAL_TABLET | Freq: Three times a day (TID) | ORAL | Status: DC | PRN

## 2013-02-10 MED ORDER — FLUTICASONE PROPIONATE 50 MCG/ACT NA SUSP
NASAL | Status: AC
  Filled 2013-02-10: qty 16

## 2013-02-10 MED ORDER — DULOXETINE HCL 30 MG PO CPEP
30.0000 mg | ORAL_CAPSULE | Freq: Every day | ORAL | Status: DC
  Administered 2013-02-10 – 2013-02-12 (×3): 30 mg via ORAL
  Filled 2013-02-10 (×4): qty 1

## 2013-02-10 MED ORDER — OXYCODONE HCL 5 MG PO TABS: 10.0000 mg | ORAL_TABLET | ORAL | Status: DC | PRN

## 2013-02-10 MED ORDER — SODIUM CHLORIDE 0.9 % IJ SOLN: 3.0000 mL | Freq: Two times a day (BID) | INTRAMUSCULAR | Status: DC

## 2013-02-10 MED ORDER — ONDANSETRON HCL 4 MG/2ML IJ SOLN
4.0000 mg | Freq: Once | INTRAMUSCULAR | Status: AC
  Administered 2013-02-10: 4 mg via INTRAVENOUS
  Filled 2013-02-10: qty 2

## 2013-02-10 MED ORDER — DEXTROSE 5 % IV SOLN
1.0000 g | Freq: Three times a day (TID) | INTRAVENOUS | Status: DC
  Administered 2013-02-10 – 2013-02-11 (×2): 1 g via INTRAVENOUS
  Filled 2013-02-10 (×6): qty 1

## 2013-02-10 MED ORDER — ATORVASTATIN CALCIUM 20 MG PO TABS
20.0000 mg | ORAL_TABLET | Freq: Every day | ORAL | Status: DC
  Administered 2013-02-11: 20 mg via ORAL
  Filled 2013-02-10 (×2): qty 1

## 2013-02-10 MED ORDER — SODIUM CHLORIDE 0.9 % IV SOLN: INTRAVENOUS | Status: DC

## 2013-02-10 MED ORDER — SODIUM CHLORIDE 0.9 % IV SOLN: INTRAVENOUS | Status: AC

## 2013-02-10 MED ORDER — METOCLOPRAMIDE HCL 10 MG PO TABS
5.0000 mg | ORAL_TABLET | Freq: Three times a day (TID) | ORAL | Status: DC
  Administered 2013-02-10 – 2013-02-12 (×5): 5 mg via ORAL
  Filled 2013-02-10 (×5): qty 1

## 2013-02-10 MED ORDER — DEXTROSE 5 % IV SOLN
INTRAVENOUS | Status: AC
  Filled 2013-02-10 (×2): qty 1

## 2013-02-10 MED ORDER — DOCUSATE SODIUM 100 MG PO CAPS
100.0000 mg | ORAL_CAPSULE | Freq: Two times a day (BID) | ORAL | Status: DC
  Administered 2013-02-10 – 2013-02-11 (×3): 100 mg via ORAL
  Filled 2013-02-10 (×4): qty 1

## 2013-02-10 MED ORDER — CEFTRIAXONE SODIUM 1 G IJ SOLR
1.0000 g | Freq: Once | INTRAMUSCULAR | Status: AC
  Administered 2013-02-10: 1 g via INTRAVENOUS
  Filled 2013-02-10: qty 10

## 2013-02-10 MED ORDER — SODIUM CHLORIDE 0.9 % IV BOLUS (SEPSIS)
250.0000 mL | Freq: Once | INTRAVENOUS | Status: AC
  Administered 2013-02-10: 250 mL via INTRAVENOUS

## 2013-02-10 MED ORDER — ISOSORBIDE MONONITRATE ER 60 MG PO TB24
60.0000 mg | ORAL_TABLET | Freq: Every day | ORAL | Status: DC
  Administered 2013-02-11 – 2013-02-12 (×2): 60 mg via ORAL
  Filled 2013-02-10 (×2): qty 1

## 2013-02-10 MED ORDER — ACETAMINOPHEN 650 MG RE SUPP: 650.0000 mg | Freq: Four times a day (QID) | RECTAL | Status: DC | PRN

## 2013-02-10 MED ORDER — VANCOMYCIN HCL IN DEXTROSE 1-5 GM/200ML-% IV SOLN
INTRAVENOUS | Status: AC
  Filled 2013-02-10: qty 200

## 2013-02-10 NOTE — ED Notes (Signed)
Complain of generalized weakness. States she has cancer but it is in remission. States she is to get a form of chemo here Wednesday

## 2013-02-10 NOTE — ED Provider Notes (Signed)
CSN: [id number]     Arrival date & time 02/10/13  1411 History   First MD Initiated Contact with Patient 02/10/13 1514     This chart was scribed for Erika Jakes, MD by Manuela Schwartz, ED scribe. This patient was seen in room APA16A/AP[facility]16A and the patient's care was started at 1514.  Chief Complaint  Patient presents with  . Fatigue   Patient is a 67 y.o. female presenting with weakness. The history is provided by the patient. No language interpreter was used.  Weakness This is a new problem. The current episode started more than 1 week ago. The problem occurs constantly. The problem has been gradually worsening. Associated symptoms include shortness of breath. Pertinent negatives include no chest pain, no abdominal pain and no headaches. The symptoms are aggravated by walking. Nothing relieves the symptoms. She has tried nothing for the symptoms.   HPI Comments: Erika Moreno is a 67 y.o. 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 She states her cancer is in remission and she is to have her next tx here on Wednesday.   She states that she always feels out of energy and has SOB with exertion or short walks across a room. She states intermittent fevers over the past few days ( max 100.4 F, but has no chills). She reports nausea, pitting edema (which has been improving), a posterior HA, nosebleeds (which are also improving in w/less frequency) and c/o dizziness especially with walking.   She denies any cough, sore throat, rhinorrhea, abdominal pain, emesis, diarrhea, dysuria, rash and no neck/back pain.  Her PCP is Dr. Neita Carp.  Past Medical History  Diagnosis Date  . Cancer of  unknown origin 11/14/2012    Favor breast versus ovarian  . Cervical cancer 1984  . Myocardial infarction 20 yrs ago  . Coronary artery disease   . Anxiety   . Chronic back pain   . Depression   . GERD (gastroesophageal reflux disease)   . Hypertension   . Hypothyroidism   . Anemia   . Vitamin B 12 deficiency   . Vitamin D deficiency disease    Past Surgical History  Procedure Laterality Date  . Heart bypass      triple bypass with stents  . Coronary artery bypass graft      3 vessels  . Cardiac catheterization      stents x3  . Abdominal hysterectomy    . Portacath placement Right 12/06/2012    Procedure: INSERTION PORT-A-CATH;  Surgeon: Fabio Bering, MD;  Location: [facility];  Service: General;  Laterality: Right;   Family History  Problem Relation Age of Onset  . Heart attack Mother   . Heart attack Father    History  Substance Use Topics  . Smoking status: Former Smoker -- 2.00 packs/day for 22 years    Types: Cigarettes    Quit date: 11/21/1988  . Smokeless tobacco: Never Used  . Alcohol Use: No   OB History   Grav Para Term Preterm Abortions TAB SAB Ect Mult Living                 Review of Systems  Constitutional: Positive for fever and  fatigue. Negative for chills.  HENT: Positive for nosebleeds. Negative for rhinorrhea and sore throat.   Respiratory: Positive for shortness of breath. Negative for cough.   Cardiovascular: Negative for chest pain.  Gastrointestinal: Positive for nausea. Negative for vomiting, abdominal pain and diarrhea.  Genitourinary: Negative for dysuria.  Musculoskeletal: Negative for back pain.  Skin: Negative for rash.  Neurological: Negative for syncope and headaches.  All other systems reviewed and are negative.   A complete 10 system review of systems was obtained and all systems are negative except as noted in the HPI and PMH.   Allergies  Morphine and related and Sulfa antibiotics  Home Medications   Current Outpatient  Rx  Name  Route  Sig  Dispense  Refill  . ALPRAZolam (XANAX) 0.25 MG tablet   Oral   Take 1 tablet (0.25 mg total) by mouth at bedtime as needed for sleep.   30 tablet   0   . aspirin EC 81 MG tablet   Oral   Take 81 mg by mouth daily.         [name] atorvastatin (LIPITOR) 20 MG tablet   Oral   Take 20 mg by mouth daily.         . clopidogrel (PLAVIX) 75 MG tablet   Oral   Take 75 mg by mouth daily.         . cyclobenzaprine (FLEXERIL) 10 MG tablet   Oral   Take 10 mg by mouth 3 (three) times daily as needed for muscle spasms.         [name] dexamethasone (DECADRON) 2 MG tablet   Oral   Take 2 mg by mouth 2 (two) times daily with a meal. Days 2,3,4         . Dexlansoprazole 30 MG capsule   Oral   Take 1 capsule (30 mg total) by mouth daily.   30 capsule   6   . DULoxetine (CYMBALTA) 30 MG capsule   Oral   Take 1 capsule (30 mg total) by mouth daily.   30 capsule   3   . furosemide (LASIX) 20 MG tablet   Oral   Take 20 mg by mouth daily as needed for fluid.          . isosorbide mononitrate (IMDUR) 60 MG 24 hr tablet   Oral   Take 60 mg by mouth daily.         [name] lisinopril-hydrochlorothiazide (PRINZIDE,ZESTORETIC) 10-12.5 MG per tablet   Oral   Take 1 tablet by mouth daily.         [name] LORazepam (ATIVAN) 1 MG tablet   Oral   Take 1 mg by mouth every 8 (eight) hours.         . metoCLOPramide (REGLAN) 5 MG tablet   Oral   Take 1 tablet (5 mg total) by mouth 4 (four) times daily. Before meals and at bedtime.   100 tablet   3   . Oxycodone HCl 10 MG TABS   Oral   Take 1 tablet (10 mg total) by mouth every 4 (four) hours as needed.   100 tablet   0   . potassium chloride SA (K-DUR,KLOR-CON) 20 MEQ tablet   Oral   Take 1 tablet (20 mEq total) by mouth 2 (two) times daily.   60 tablet   3   . lidocaine-prilocaine (EMLA) cream      Apply a quarter size amount to port site 1 hour prior to  chemo. Do not rub in. Cover with plastic wrap.   30 g    prn   . nitroGLYCERIN (NITROSTAT) 0.6 MG SL tablet   Sublingual   Place 0.6 mg under the tongue every 5 (five) minutes as needed for chest pain.          Triage vitals: BP 110/53  Pulse 68  Temp(Src) 98.5 F (36.9 C) (Oral)  Ht 5\' 6"  (1.676 m)  Wt 145 lb (65.772 kg)  BMI 23.41 kg/m2  SpO2 98% Physical Exam  Nursing note and vitals reviewed. Constitutional: She is oriented to person, place, and time. She appears well-developed and well-nourished. No distress.  HENT:  Head: Normocephalic and atraumatic.  Mouth/Throat: Oropharynx is clear and moist.  Eyes: Conjunctivae and EOM are normal.  Neck: Neck supple. No tracheal deviation present.  Cardiovascular: Normal rate, regular rhythm and normal heart sounds.   No murmur heard. Pulmonary/Chest: Effort normal and breath sounds normal. No respiratory distress. She has no wheezes. She has no rales.  Abdominal: Soft. Bowel sounds are normal. She exhibits no distension. There is no tenderness.  Musculoskeletal: Normal range of motion.  Neurological: She is alert and oriented to person, place, and time. No cranial nerve deficit.  Skin: Skin is warm and dry.  Psychiatric: She has a normal mood and affect. Her behavior is normal.    ED Course  Procedures (including critical care time) DIAGNOSTIC STUDIES: Oxygen Saturation is 98% on room air, normal by my interpretation.    COORDINATION OF CARE: At 345 PM Discussed treatment plan with patient which includes IV fluids, Zofran, CXR, head CT, blood work, cardiac enzymes, EKG. Patient agrees.   Labs Review Labs Reviewed  CBC WITH DIFFERENTIAL - Abnormal; Notable for the following:    WBC 3.7 (*)    RBC 1.93 (*)    Hemoglobin 6.1 (*)    HCT 18.4 (*)    RDW 21.3 (*)    Platelets 60 (*)    Neutro Abs 1.6 (*)    Monocytes Relative 16 (*)    All other components within normal limits  COMPREHENSIVE METABOLIC PANEL - Abnormal; Notable for the following:    Sodium 128 (*)    Chloride  93 (*)    Glucose, Bld 115 (*)    Albumin 3.2 (*)    GFR calc non Af Amer 75 (*)    GFR calc Af Amer 86 (*)    All other components within normal limits  URINALYSIS, ROUTINE W REFLEX MICROSCOPIC - Abnormal; Notable for the following:    APPearance CLOUDY (*)    Hgb urine dipstick TRACE (*)    Urobilinogen, UA 4.0 (*)    Nitrite POSITIVE (*)    All other components within normal limits  PRO B NATRIURETIC PEPTIDE - Abnormal; Notable for the following:    Pro B Natriuretic peptide (BNP) 193.2 (*)    All other components within normal limits  URINE MICROSCOPIC-ADD ON - Abnormal; Notable for the following:    Bacteria, UA MANY (*)    All other components within normal limits  URINE CULTURE  CULTURE, BLOOD (ROUTINE X 2)  CULTURE, BLOOD (ROUTINE X 2)  LIPASE, BLOOD  TROPONIN I  TYPE AND SCREEN  PREPARE RBC (CROSSMATCH)   Results for orders placed during the hospital encounter of 02/10/13  CBC WITH DIFFERENTIAL      Result Value Range   WBC 3.7 (*) 4.0 - 10.5 K/uL   RBC 1.93 (*) 3.87 - 5.11 MIL/uL  Hemoglobin 6.1 (*) 12.0 - 15.0 g/dL   HCT 95.2 (*) 84.1 - 32.4 %   MCV 95.3  78.0 - 100.0 fL   MCH 31.6  26.0 - 34.0 pg   MCHC 33.2  30.0 - 36.0 g/dL   RDW 40.1 (*) 02.7 - 25.3 %   Platelets 60 (*) 150 - 400 K/uL   Neutrophils Relative % 45  43 - 77 %   Neutro Abs 1.6 (*) 1.7 - 7.7 K/uL   Lymphocytes Relative 39  12 - 46 %   Lymphs Abs 1.5  0.7 - 4.0 K/uL   Monocytes Relative 16 (*) 3 - 12 %   Monocytes Absolute 0.6  0.1 - 1.0 K/uL   Eosinophils Relative 0  0 - 5 %   Eosinophils Absolute 0.0  0.0 - 0.7 K/uL   Basophils Relative 0  0 - 1 %   Basophils Absolute 0.0  0.0 - 0.1 K/uL  COMPREHENSIVE METABOLIC PANEL      Result Value Range   Sodium 128 (*) 135 - 145 mEq/L   Potassium 3.7  3.5 - 5.1 mEq/L   Chloride 93 (*) 96 - 112 mEq/L   CO2 25  19 - 32 mEq/L   Glucose, Bld 115 (*) 70 - 99 mg/dL   BUN 22  6 - 23 mg/dL   Creatinine, Ser 6.64  0.50 - 1.10 mg/dL   Calcium 9.3  8.4  - 40.3 mg/dL   Total Protein 6.7  6.0 - 8.3 g/dL   Albumin 3.2 (*) 3.5 - 5.2 g/dL   AST 24  0 - 37 U/L   ALT 19  0 - 35 U/L   Alkaline Phosphatase 91  39 - 117 U/L   Total Bilirubin 0.6  0.3 - 1.2 mg/dL   GFR calc non Af Amer 75 (*) >90 mL/min   GFR calc Af Amer 86 (*) >90 mL/min  LIPASE, BLOOD      Result Value Range   Lipase 22  11 - 59 U/L  URINALYSIS, ROUTINE W REFLEX MICROSCOPIC      Result Value Range   Color, Urine YELLOW  YELLOW   APPearance CLOUDY (*) CLEAR   Specific Gravity, Urine 1.015  1.005 - 1.030   pH 7.0  5.0 - 8.0   Glucose, UA NEGATIVE  NEGATIVE mg/dL   Hgb urine dipstick TRACE (*) NEGATIVE   Bilirubin Urine NEGATIVE  NEGATIVE   Ketones, ur NEGATIVE  NEGATIVE mg/dL   Protein, ur NEGATIVE  NEGATIVE mg/dL   Urobilinogen, UA 4.0 (*) 0.0 - 1.0 mg/dL   Nitrite POSITIVE (*) NEGATIVE   Leukocytes, UA NEGATIVE  NEGATIVE  PRO B NATRIURETIC PEPTIDE      Result Value Range   Pro B Natriuretic peptide (BNP) 193.2 (*) 0 - 125 pg/mL  TROPONIN I      Result Value Range   Troponin I <0.30  <0.30 ng/mL  URINE MICROSCOPIC-ADD ON      Result Value Range   Squamous Epithelial / LPF RARE  RARE   WBC, UA 3-6  <3 WBC/hpf   RBC / HPF 0-2  <3 RBC/hpf   Bacteria, UA MANY (*) RARE    Imaging Review Dg Chest 2 View  02/10/2013   CLINICAL DATA:  Fatigue and weakness. Stage IV carcinoma of unknown primary.  EXAM: CHEST  2 VIEW  COMPARISON:  None.  FINDINGS: Cardiac enlargement is stable. A right subclavian power port is stable in position as well. This is a somewhat  lordotic view. No significant edema or effusion is present to suggest failure. The patient is status post median sternotomy for CABG. No focal mass lesion or airspace disease is evident. The visualized soft tissues and bony thorax is normal.  IMPRESSION: 1. Stable cardiomegaly without failure. 2. No acute cardiopulmonary disease or significant interval change.   Electronically Signed   By: Gennette Pac M.D.   On:  02/10/2013 17:34   Ct Head Wo Contrast  02/10/2013   CLINICAL DATA:  Fatigue, history of breast cancer  EXAM: CT HEAD WITHOUT CONTRAST  TECHNIQUE: Contiguous axial images were obtained from the base of the skull through the vertex without intravenous contrast.  COMPARISON:  None.  FINDINGS: At least partial opacification left maxillary sinus. Chronic appearing inflammatory change involving the right maxillary sinus also identified. There is no evidence of hemorrhage or extra-axial fluid. No abnormal attenuation is seen to suggest infarct. There is no hydrocephalus. No mass effect.  IMPRESSION: Sinusitis. No acute findings.   Electronically Signed   By: Esperanza Heir M.D.   On: 02/10/2013 17:14    EKG Interpretation     Ventricular Rate:  82 PR Interval:  146 QRS Duration: 94 QT Interval:  366 QTC Calculation: 427 R Axis:   -16 Text Interpretation:  Normal sinus rhythm with sinus arrhythmia Incomplete right bundle branch block Cannot rule out Anteroseptal infarct (cited on or before 18-Nov-2003) Abnormal ECG When compared with ECG of 30-Nov-2012 13:03, Inverted T waves have replaced nonspecific T wave abnormality in Lateral leads           CRITICAL CARE Performed by: Erika Moreno. Total critical care time: 30 Critical care time was exclusive of separately billable procedures and treating other patients. Critical care was necessary to treat or prevent imminent or life-threatening deterioration. Critical care was time spent personally by me on the following activities: development of treatment plan with patient and/or surrogate as well as nursing, discussions with consultants, evaluation of patient's response to treatment, examination of patient, obtaining history from patient or surrogate, ordering and performing treatments and interventions, ordering and review of laboratory studies, ordering and review of radiographic studies, pulse oximetry and re-evaluation of patient's  condition.   MDM   1. Anemia due to chemotherapy   2. UTI (lower urinary tract infection)     Patient with acute anemia. Will require blood transfusion. Hemoglobin is in the 6 range. Patient will mostly require 2 units posterior fourchette here in the emergency department. This probably explains her symptoms of fatigue and shortness of breath. Patient also required blood transfusion due to chemotherapy around October 20. Patient does give history of some low-grade fevers blood cultures will be done urinalysis is positive nitrite this could be representative urinary tract infection will treat with Rocephin. Head CT does show of some chronic sinusitis and perhaps some acute sinusitis on the right side. In addition patient has leukopenia. Platelet count is low but above 20,000. Chest x-rays negative for pneumonia. Patient will require admission. Patient was due for chemotherapy again on Wednesday.  Patient is not febrile here does not have any clinical symptoms consistent of sepsis. Patient is alert.      I personally performed the services described in this documentation, which was scribed in my presence. The recorded information has been reviewed and is accurate.       Erika Jakes, MD 02/10/13 [phone]

## 2013-02-10 NOTE — ED Notes (Signed)
Port a cath accessed, blood work obtained, pt tolerated well

## 2013-02-10 NOTE — ED Notes (Signed)
CRITICAL VALUE ALERT  Critical value received:  Hgb 6.1  Date of notification:  02/10/2013  Time of notification:  17:19  Critical value read back: yes  Nurse who received alert:  Juliette Alcide, RN   MD notified (1st page):  Dr. Deretha Emory   Time of first page:  17:20  MD notified (2nd page):  Time of second page:  Responding MD:  Dr. Deretha Emory   Time MD responded:  17:20

## 2013-02-10 NOTE — Progress Notes (Signed)
ANTIBIOTIC CONSULT NOTE - INITIAL  Pharmacy Consult for Vancomycin and Cefepime Indication: UTI, sinusitis, cellulitis, fever, pancytopenia  Allergies  Allergen Reactions  . Morphine And Related     Hallucinates with large IV doses.  . Sulfa Antibiotics Itching   Patient Measurements: Height: 5\' 6"  (167.6 cm) Weight: 160 lb (72.576 kg) IBW/kg (Calculated) : 59.3  Vital Signs: Temp: 99.1 F (37.3 C) (11/08 2030) Temp src: Oral (11/08 1853) BP: 99/53 mmHg (11/08 2030) Pulse Rate: 109 (11/08 2030) Intake/Output from previous day:   Intake/Output from this shift:    Labs:  Recent Labs  02/10/13 1655  WBC 3.7*  HGB 6.1*  PLT 60*  CREATININE 0.80   Estimated Creatinine Clearance: 69.6 ml/min (by C-G formula based on Cr of 0.8). No results found for this basename: VANCOTROUGH, Leodis Binet, VANCORANDOM, GENTTROUGH, GENTPEAK, GENTRANDOM, TOBRATROUGH, TOBRAPEAK, TOBRARND, AMIKACINPEAK, AMIKACINTROU, AMIKACIN,  in the last 72 hours   Microbiology: Recent Results (from the past 720 hour(s))  CULTURE, BLOOD (ROUTINE X 2)     Status: None   Collection Time    02/10/13  5:57 PM      Result Value Range Status   Specimen Description Blood LEFT ANTECUBITAL   Final   Special Requests BOTTLES DRAWN AEROBIC AND ANAEROBIC 8CC   Final   Culture PENDING   Incomplete   Report Status PENDING   Incomplete  CULTURE, BLOOD (ROUTINE X 2)     Status: None   Collection Time    02/10/13  6:15 PM      Result Value Range Status   Specimen Description Blood BLOOD RIGHT HAND   Final   Special Requests BOTTLES DRAWN AEROBIC AND ANAEROBIC 8CC   Final   Culture PENDING   Incomplete   Report Status PENDING   Incomplete   Medical History: Past Medical History  Diagnosis Date  . 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 Cervical cancer 1984  . Myocardial infarction 20 yrs ago  . Coronary artery disease   . Anxiety   . Chronic back pain   . Depression   . GERD  (gastroesophageal reflux disease)   . Hypertension   . Hypothyroidism   . Anemia   . Vitamin B 12 deficiency   . Vitamin D deficiency disease     Medications:  Scheduled:  . [START ON 02/11/2013] atorvastatin  20 mg Oral Daily  . ceFEPime (MAXIPIME) IV  1 g Intravenous Q8H  . docusate sodium  100 mg Oral BID  . DULoxetine  30 mg Oral Daily  . fluticasone  1 spray Each Nare Daily  . furosemide  20 mg Intravenous Once  . [START ON 02/11/2013] isosorbide mononitrate  60 mg Oral Daily  . metoCLOPramide  5 mg Oral TID AC & HS  . sodium chloride  3 mL Intravenous Q12H  . vancomycin  1,000 mg Intravenous Q12H   Assessment: 67yo female recently diagnosed with adenocarcinoma and getting chemo.  Pt c/o fever, frequent urination and redness over lower legs.  Pt has good renal fxn.  Labs reviewed, pancytopenia noted.  Estimated Creatinine Clearance: 69.6 ml/min (by C-G formula based on Cr of 0.8).  Goal of Therapy:  Vancomycin trough level 15-20 mcg/ml  Plan:  Cefepime 1gm IV q8h Vancomycin 1gm IV q12h Check trough at steady state Monitor labs, renal fxn, and cultures  Valrie Hart A 02/10/2013,8:48 PM

## 2013-02-10 NOTE — H&P (Signed)
[facility] History and Physical  Erika Moreno ZOX:[id number] DOB: 1945/05/07 DOA: 02/10/2013   PCP: [name], MD  Specialists: She is followed by Cardiologists at [facility]. She's followed by [facility].  Chief Complaint: Weakness, and shortness of breath with exertion  HPI: Erika Moreno is a 67 y.o. female with a past medical history of coronary artery disease, status post CABG about 30 years ago, recently diagnosed adenocarcinoma of unknown primary, who is getting chemotherapy with the last dose given on October 22. She received carboplatin and Taxol. She is supposed to get another dose of chemotherapy. 3 weeks from then. Patient also mentioned, that she's had that neuropathy from her chemotherapeutic agent and was started on Cymbalta. Over the last few weeks patient has been feeling weak because of the chemotherapy. And, then over the last few days she has noticed increased weakness. She would get really short of breath with exertion. This progressed so much that she had to come in to the hospital today. She has been having fever up to 101F. She denies any cough. The has had headache in the back, and facial pain and when she blows her nose she has noticed dry blood occasionally. Denies any shortness of breath other than when she is exerting. Denies any diarrhea. Has been urinating a lot, but denies any dysuria. No vaginal discharge. No abdominal pain. She had some nausea, but no vomiting. She's had leg swelling, but she says that has improved. She does have some redness over her lower legs.  Home Medications: Prior to Admission medications   Medication Sig Start Date End Date Taking? Authorizing Provider  ALPRAZolam (XANAX) 0.25 MG tablet Take 1 tablet (0.25 mg total) by mouth at bedtime as needed for sleep. 01/03/13  Yes Alla German, MD  aspirin EC 81 MG tablet Take 81 mg by mouth daily.   Yes Historical Provider, MD  atorvastatin (LIPITOR) 20 MG tablet  Take 20 mg by mouth daily.   Yes Historical Provider, MD  clopidogrel (PLAVIX) 75 MG tablet Take 75 mg by mouth daily.   Yes Historical Provider, MD  cyclobenzaprine (FLEXERIL) 10 MG tablet Take 10 mg by mouth 3 (three) times daily as needed for muscle spasms.   Yes Historical Provider, MD  dexamethasone (DECADRON) 2 MG tablet Take 2 mg by mouth 2 (two) times daily with a meal. Days 2,3,4   Yes Historical Provider, MD  Dexlansoprazole 30 MG capsule Take 1 capsule (30 mg total) by mouth daily. 01/24/13  Yes Alla German, MD  DULoxetine (CYMBALTA) 30 MG capsule Take 1 capsule (30 mg total) by mouth daily. 01/15/13  Yes Alla German, MD  furosemide (LASIX) 20 MG tablet Take 20 mg by mouth daily as needed for fluid.    Yes Historical Provider, MD  isosorbide mononitrate (IMDUR) 60 MG 24 hr tablet Take 60 mg by mouth daily.   Yes Historical Provider, MD  lisinopril-hydrochlorothiazide (PRINZIDE,ZESTORETIC) 10-12.5 MG per tablet Take 1 tablet by mouth daily.   Yes Historical Provider, MD  LORazepam (ATIVAN) 1 MG tablet Take 1 mg by mouth every 8 (eight) hours.   Yes Historical Provider, MD  metoCLOPramide (REGLAN) 5 MG tablet Take 1 tablet (5 mg total) by mouth 4 (four) times daily. Before meals and at bedtime. 01/24/13  Yes Alla German, MD  Oxycodone HCl 10 MG TABS Take 1 tablet (10 mg total) by mouth every 4 (four) hours as needed. 01/29/13  Yes Maurine Minister Kefalas, PA-C  potassium chloride SA (  K-DUR,KLOR-CON) 20 MEQ tablet Take 1 tablet (20 mEq total) by mouth 2 (two) times daily. 01/15/13  Yes Alla German, MD  lidocaine-prilocaine (EMLA) cream Apply a quarter size amount to port site 1 hour prior to chemo. Do not rub in. Cover with plastic wrap. 12/12/12   Ellouise Newer, PA-C  nitroGLYCERIN (NITROSTAT) 0.6 MG SL tablet Place 0.6 mg under the tongue every 5 (five) minutes as needed for chest pain.    Historical Provider, MD    Allergies:  Allergies  Allergen Reactions  . Morphine And  Related     Hallucinates with large IV doses.  . Sulfa Antibiotics Itching    Past Medical History: Past Medical History  Diagnosis Date  . 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 Cervical cancer 1984  . Myocardial infarction 20 yrs ago  . Coronary artery disease   . Anxiety   . Chronic back pain   . Depression   . GERD (gastroesophageal reflux disease)   . Hypertension   . Hypothyroidism   . Anemia   . Vitamin B 12 deficiency   . Vitamin D deficiency disease     Past Surgical History  Procedure Laterality Date  . Heart bypass      triple bypass with stents  . Coronary artery bypass graft      3 vessels  . Cardiac catheterization      stents x3  . Abdominal hysterectomy    . Portacath placement Right 12/06/2012    Procedure: INSERTION PORT-A-CATH;  Surgeon: Fabio Bering, MD;  Location: [facility];  Service: General;  Laterality: Right;    Social History: Patient lives in [place], [place] with her daughter, and niece. She's a former smoker and quit about 20 years ago. Has approximately 40 pack history of smoking. No alcohol use. No illicit drug use. She's independent with her daily activities.  Family History:  Family History  Problem Relation Age of Onset  . Heart attack Mother   . Heart attack Father      Review of Systems - History obtained from the patient General ROS: positive for  - fatigue Psychological ROS: negative Ophthalmic ROS: negative ENT ROS: as in hpi Allergy and Immunology ROS: negative Hematological and Lymphatic ROS: easy bruising Endocrine ROS: negative Respiratory ROS: no cough, shortness of breath, or wheezing Cardiovascular ROS: no chest pain or dyspnea on exertion Gastrointestinal ROS: as in hpi Genito-Urinary ROS: as in hpi Musculoskeletal ROS: negative Neurological ROS: no TIA or stroke symptoms Dermatological ROS: negative  Physical Examination  Filed Vitals:   02/10/13 1422 02/10/13 1833 02/10/13 1853   BP: 110/53 115/66 117/63  Pulse: 68 88 90  Temp: 98.5 F (36.9 C) 99.1 F (37.3 C) 99.1 F (37.3 C)  TempSrc: Oral Oral Oral  Resp:  20 16  Height: 5\' 6"  (1.676 m)  5\' 6"  (1.676 m)  Weight: 65.772 kg (145 lb)  72.576 kg (160 lb)  SpO2: 98% 96% 99%    General appearance: alert, cooperative, appears stated age and no distress Head: Normocephalic, without obvious abnormality, atraumatic Eyes: conjunctivae/corneas clear. PERRL, EOM's intact.  Nose: sinus tenderness bilateral, no active bleeding noted Throat: lips, mucosa, and tongue normal; teeth and gums normal Neck: no adenopathy, no carotid bruit, no JVD, supple, symmetrical, trachea midline, thyroid not enlarged, symmetric, no tenderness/mass/nodules and soft and supple Resp: clear to auscultation bilaterally Cardio: regular rate and rhythm, S1, S2 normal, no murmur, click, rub or gallop GI:  soft, minimal tenderness in the epigastric area without any rebound, rigidity, or guarding. bowel sounds normal; no masses,  no organomegaly Extremities: Some edema is noted in bilateral lower extremity. Some erythema is also present. No specific calf tenderness. She did have some hyperalgesia over the skin. Pulses: 2+ and symmetric Skin: Erythema of the lower extremity. Small areas of Bruising noted over the arms and the back. Neurologic: She is alert and oriented x3. No cranial deficits. Motor strength is equal, bilateral upper and lower extremity. No other deficits appreciated. Gait not assessed due to fatigue.  Laboratory Data: Results for orders placed during the hospital encounter of 02/10/13 (from the past 48 hour(s))  URINALYSIS, ROUTINE W REFLEX MICROSCOPIC     Status: Abnormal   Collection Time    02/10/13  4:50 PM      Result Value Range   Color, Urine YELLOW  YELLOW   APPearance CLOUDY (*) CLEAR   Specific Gravity, Urine 1.015  1.005 - 1.030   pH 7.0  5.0 - 8.0   Glucose, UA NEGATIVE  NEGATIVE mg/dL   Hgb urine dipstick TRACE  (*) NEGATIVE   Bilirubin Urine NEGATIVE  NEGATIVE   Ketones, ur NEGATIVE  NEGATIVE mg/dL   Protein, ur NEGATIVE  NEGATIVE mg/dL   Urobilinogen, UA 4.0 (*) 0.0 - 1.0 mg/dL   Nitrite POSITIVE (*) NEGATIVE   Leukocytes, UA NEGATIVE  NEGATIVE  URINE MICROSCOPIC-ADD ON     Status: Abnormal   Collection Time    02/10/13  4:50 PM      Result Value Range   Squamous Epithelial / LPF RARE  RARE   WBC, UA 3-6  <3 WBC/hpf   RBC / HPF 0-2  <3 RBC/hpf   Bacteria, UA MANY (*) RARE  CBC WITH DIFFERENTIAL     Status: Abnormal   Collection Time    02/10/13  4:55 PM      Result Value Range   WBC 3.7 (*) 4.0 - 10.5 K/uL   RBC 1.93 (*) 3.87 - 5.11 MIL/uL   Hemoglobin 6.1 (*) 12.0 - 15.0 g/dL   Comment: RESULT REPEATED AND VERIFIED     CRITICAL RESULT CALLED TO, READ BACK BY AND VERIFIED WITH:     POINDEXTER,M @ 1719 ON11/8/14 BY WOODIE,J   HCT 18.4 (*) 36.0 - 46.0 %   MCV 95.3  78.0 - 100.0 fL   MCH 31.6  26.0 - 34.0 pg   MCHC 33.2  30.0 - 36.0 g/dL   RDW 16.1 (*) 09.6 - 04.5 %   Platelets 60 (*) 150 - 400 K/uL   Comment: SPECIMEN CHECKED FOR CLOTS     PLATELET COUNT CONFIRMED BY SMEAR   Neutrophils Relative % 45  43 - 77 %   Neutro Abs 1.6 (*) 1.7 - 7.7 K/uL   Lymphocytes Relative 39  12 - 46 %   Lymphs Abs 1.5  0.7 - 4.0 K/uL   Monocytes Relative 16 (*) 3 - 12 %   Monocytes Absolute 0.6  0.1 - 1.0 K/uL   Eosinophils Relative 0  0 - 5 %   Eosinophils Absolute 0.0  0.0 - 0.7 K/uL   Basophils Relative 0  0 - 1 %   Basophils Absolute 0.0  0.0 - 0.1 K/uL  COMPREHENSIVE METABOLIC PANEL     Status: Abnormal   Collection Time    02/10/13  4:55 PM      Result Value Range   Sodium 128 (*) 135 - 145 mEq/L  Potassium 3.7  3.5 - 5.1 mEq/L   Chloride 93 (*) 96 - 112 mEq/L   CO2 25  19 - 32 mEq/L   Glucose, Bld 115 (*) 70 - 99 mg/dL   BUN 22  6 - 23 mg/dL   Creatinine, Ser 9.14  0.50 - 1.10 mg/dL   Calcium 9.3  8.4 - 78.2 mg/dL   Total Protein 6.7  6.0 - 8.3 g/dL   Albumin 3.2 (*) 3.5 - 5.2  g/dL   AST 24  0 - 37 U/L   ALT 19  0 - 35 U/L   Alkaline Phosphatase 91  39 - 117 U/L   Total Bilirubin 0.6  0.3 - 1.2 mg/dL   GFR calc non Af Amer 75 (*) >90 mL/min   GFR calc Af Amer 86 (*) >90 mL/min   Comment: (NOTE)     The eGFR has been calculated using the CKD EPI equation.     This calculation has not been validated in all clinical situations.     eGFR's persistently <90 mL/min signify possible Chronic Kidney     Disease.  LIPASE, BLOOD     Status: None   Collection Time    02/10/13  4:55 PM      Result Value Range   Lipase 22  11 - 59 U/L  PRO B NATRIURETIC PEPTIDE     Status: Abnormal   Collection Time    02/10/13  4:55 PM      Result Value Range   Pro B Natriuretic peptide (BNP) 193.2 (*) 0 - 125 pg/mL  TROPONIN I     Status: None   Collection Time    02/10/13  4:55 PM      Result Value Range   Troponin I <0.30  <0.30 ng/mL   Comment:            Due to the release kinetics of cTnI,     a negative result within the first hours     of the onset of symptoms does not rule out     myocardial infarction with certainty.     If myocardial infarction is still suspected,     repeat the test at appropriate intervals.  TYPE AND SCREEN     Status: None   Collection Time    02/10/13  5:37 PM      Result Value Range   ABO/RH(D) A POS     Antibody Screen NEG     Sample Expiration 02/13/2013     Unit Number [id number]     Blood Component Type RED CELLS,LR     Unit division 00     Status of Unit ALLOCATED     Transfusion Status OK TO TRANSFUSE     Crossmatch Result Compatible     Unit Number [id number]     Blood Component Type RED CELLS,LR     Unit division 00     Status of Unit ALLOCATED     Transfusion Status OK TO TRANSFUSE     Crossmatch Result Compatible    PREPARE RBC (CROSSMATCH)     Status: None   Collection Time    02/10/13  5:37 PM      Result Value Range   Order Confirmation ORDER PROCESSED BY BLOOD BANK    CULTURE, BLOOD (ROUTINE X 2)     Status:  None   Collection Time    02/10/13  5:57 PM      Result Value Range   Specimen Description  Blood LEFT ANTECUBITAL     Special Requests BOTTLES DRAWN AEROBIC AND ANAEROBIC 8CC     Culture PENDING     Report Status PENDING    CULTURE, BLOOD (ROUTINE X 2)     Status: None   Collection Time    02/10/13  6:15 PM      Result Value Range   Specimen Description Blood BLOOD RIGHT HAND     Special Requests BOTTLES DRAWN AEROBIC AND ANAEROBIC 8CC     Culture PENDING     Report Status PENDING      Radiology Reports: Dg Chest 2 View  02/10/2013   CLINICAL DATA:  Fatigue and weakness. Stage IV carcinoma of unknown primary.  EXAM: CHEST  2 VIEW  COMPARISON:  None.  FINDINGS: Cardiac enlargement is stable. A right subclavian power port is stable in position as well. This is a somewhat lordotic view. No significant edema or effusion is present to suggest failure. The patient is status post median sternotomy for CABG. No focal mass lesion or airspace disease is evident. The visualized soft tissues and bony thorax is normal.  IMPRESSION: 1. Stable cardiomegaly without failure. 2. No acute cardiopulmonary disease or significant interval change.   Electronically Signed   By: Gennette Pac M.D.   On: 02/10/2013 17:34   Ct Head Wo Contrast  02/10/2013   CLINICAL DATA:  Fatigue, history of breast cancer  EXAM: CT HEAD WITHOUT CONTRAST  TECHNIQUE: Contiguous axial images were obtained from the base of the skull through the vertex without intravenous contrast.  COMPARISON:  None.  FINDINGS: At least partial opacification left maxillary sinus. Chronic appearing inflammatory change involving the right maxillary sinus also identified. There is no evidence of hemorrhage or extra-axial fluid. No abnormal attenuation is seen to suggest infarct. There is no hydrocephalus. No mass effect.  IMPRESSION: Sinusitis. No acute findings.   Electronically Signed   By: Esperanza Heir M.D.   On: 02/10/2013 17:14     Electrocardiogram: Sinus rhythm at 82 beats per minute. Normal axis. There is evidence for right bundle branch block. Nonspecific T wave changes, including T inversion in aVL. She has Q waves in V1 V2. These changes are old and similar to EKG from August.  Problem List  Principal Problem:   Dyspnea on exertion Active Problems:   Cancer of unknown origin   Cancer with unknown primary site   Acute sinusitis   Normocytic anemia   Antineoplastic chemotherapy induced pancytopenia   UTI (lower urinary tract infection)   Neuropathy due to chemotherapeutic drug   Assessment: This is a 67 year old, Caucasian female, with a recent diagnosis of adenocarcinoma of unknown primary, with a known history of CAD, who presents with generalized weakness, and dyspnea on exertion. She's found to be pancytopenic with profound anemia and thrombocytopenia. She is leukopenic, but does not have significant neutropenia. She's also found to have sinusitis, both clinically as well as on imaging studies. She has lower extremity edema with some erythema as well. She has a history of fever, which could be explained by either. The sinusitis, or abnormal, UA. She does have a headache, but no clinical evidence for meningitis.  Plan: #1 generalized weakness and dyspnea on exertion secondary to severe anemia: She will be transfused 2 units of blood. CBCs will be monitored closely. PT and OT will be consulted.  #2 pancytopenia with severe anemia and thrombocytopenia and moderate leukopenia: No role for marrow stimulants at this time. Blood transfusion will be provided as discussed  above. Continue to monitor counts. No active bleeding is noted.  #3 low-grade fever with possible acute sinusitis, UTI, possible cellulitis of lower extremities: We will cover her with broad-spectrum antibiotics with vancomycin and cefepime for now. Blood cultures will be followed up on.  #4 acute sinusitis: Treat with antibiotics and discussed  above. Flonase will be prescribed.   #5 possible UTI: Urine cultures will be followed up on. Antibiotics as above.  #6 lower extremity edema with erythema: Could be cellulitis. DVt is another consideration given history of cancer. We will get venous Dopplers. Suspicion is low and so we will not utilize anticoagulation, especially considering her thrombocytopenia.  #7 adenocarcinoma of unknown primary: She is status post chemotherapy on October 22 and supposed to receive another dose 3 weeks from then. We will notify her oncologist.  #8 chemotherapy induced peripheral neuropathy: Continue Cymbalta.  #8 history of coronary artery disease, status post CABG 30 years ago. She's had 2 stents placed since then with the last one placed about 2 years ago. Due to her thrombocytopenia I am going to hold her Plavix and aspirin for now. As the counts improve these can be reinitiated.   DVT Prophylaxis: Utilize TED stockings for now. Cannot do SCDs due to concern about possible DVT versus cellulitis. Cannot use anticoagulants due to thrombocytopenia. Code Status: Full code Family Communication: Discussed with the patient  Disposition Plan: Admit to telemetry   Further management decisions will depend on results of further testing and patient's response to treatment.  [facility]  [facility] Pager [phone]  If 7PM-7AM, please contact night-coverage [email or web address] Password [email or web address]  02/10/2013, 8:11 PM

## 2013-02-11 ENCOUNTER — Inpatient Hospital Stay (HOSPITAL_COMMUNITY): Payer: PRIVATE HEALTH INSURANCE

## 2013-02-11 DIAGNOSIS — R0609 Other forms of dyspnea: Secondary | ICD-10-CM

## 2013-02-11 DIAGNOSIS — D649 Anemia, unspecified: Secondary | ICD-10-CM

## 2013-02-11 DIAGNOSIS — G620 Drug-induced polyneuropathy: Secondary | ICD-10-CM

## 2013-02-11 LAB — CBC WITH DIFFERENTIAL/PLATELET
Basophils Absolute: 0 10*3/uL (ref 0.0–0.1)
Basophils Relative: 0 % (ref 0–1)
Eosinophils Absolute: 0 10*3/uL (ref 0.0–0.7)
Eosinophils Relative: 0 % (ref 0–5)
Hemoglobin: 8.8 g/dL — ABNORMAL LOW (ref 12.0–15.0)
Lymphocytes Relative: 33 % (ref 12–46)
MCV: 93.9 fL (ref 78.0–100.0)
Monocytes Absolute: 0.6 10*3/uL (ref 0.1–1.0)
Neutro Abs: 1.8 10*3/uL (ref 1.7–7.7)
Neutrophils Relative %: 49 % (ref 43–77)
Platelets: 59 10*3/uL — ABNORMAL LOW (ref 150–400)
RBC: 2.77 MIL/uL — ABNORMAL LOW (ref 3.87–5.11)
RDW: 18.2 % — ABNORMAL HIGH (ref 11.5–15.5)
WBC: 3.6 10*3/uL — ABNORMAL LOW (ref 4.0–10.5)

## 2013-02-11 LAB — COMPREHENSIVE METABOLIC PANEL
AST: 20 U/L (ref 0–37)
BUN: 15 mg/dL (ref 6–23)
CO2: 27 mEq/L (ref 19–32)
Calcium: 9.2 mg/dL (ref 8.4–10.5)
Creatinine, Ser: 0.71 mg/dL (ref 0.50–1.10)
GFR calc Af Amer: >90 mL/min (ref >90)
GFR calc non Af Amer: 87 mL/min — ABNORMAL LOW (ref >90)
Glucose, Bld: 109 mg/dL — ABNORMAL HIGH (ref 70–99)
Total Bilirubin: 1 mg/dL (ref 0.3–1.2)
Total Protein: 6.8 g/dL (ref 6.0–8.3)

## 2013-02-11 LAB — CBC
Hemoglobin: 9.2 g/dL — ABNORMAL LOW (ref 12.0–15.0)
Platelets: DECREASED 10*3/uL (ref 150–400)
RBC: 2.94 MIL/uL — ABNORMAL LOW (ref 3.87–5.11)
WBC: 3.7 10*3/uL — ABNORMAL LOW (ref 4.0–10.5)

## 2013-02-11 LAB — PROTIME-INR
INR: 1.08 (ref 0.00–1.49)
Prothrombin Time: 13.8 seconds (ref 11.6–15.2)

## 2013-02-11 MED ORDER — ENOXAPARIN SODIUM 40 MG/0.4ML ~~LOC~~ SOLN
40.0000 mg | SUBCUTANEOUS | Status: DC
  Administered 2013-02-11: 40 mg via SUBCUTANEOUS
  Filled 2013-02-11: qty 0.4

## 2013-02-11 MED ORDER — IBUPROFEN 600 MG PO TABS
600.0000 mg | ORAL_TABLET | Freq: Three times a day (TID) | ORAL | Status: AC
  Administered 2013-02-11 (×3): 600 mg via ORAL
  Filled 2013-02-11 (×3): qty 1

## 2013-02-11 MED ORDER — SODIUM CHLORIDE 0.9 % IJ SOLN
10.0000 mL | Freq: Two times a day (BID) | INTRAMUSCULAR | Status: DC
  Administered 2013-02-11 (×2): 10 mL via INTRAVENOUS

## 2013-02-11 MED ORDER — CEFEPIME HCL 2 G IJ SOLR
2.0000 g | Freq: Three times a day (TID) | INTRAMUSCULAR | Status: DC
  Administered 2013-02-11 – 2013-02-12 (×3): 2 g via INTRAVENOUS
  Filled 2013-02-11 (×4): qty 2

## 2013-02-11 NOTE — Progress Notes (Signed)
ANTICOAGULATION CONSULT NOTE - Initial Consult  Pharmacy Consult for Lovenox Indication: VTE prophylaxis  Allergies  Allergen Reactions  . Morphine And Related     Hallucinates with large IV doses.  . Sulfa Antibiotics Itching    Patient Measurements: Height: 5\' 6"  (167.6 cm) Weight: 160 lb (72.576 kg) IBW/kg (Calculated) : 59.3  Vital Signs: Temp: 99.2 F (37.3 C) (11/09 0536) BP: 112/88 mmHg (11/09 0536) Pulse Rate: 80 (11/09 0536)  Labs:  Recent Labs  02/10/13 1655 02/11/13 0641  HGB 6.1* 9.2*  HCT 18.4* 27.5*  PLT 60* PLATELET CLUMPS NOTED ON SMEAR, COUNT APPEARS DECREASED  LABPROT  --  13.8  INR  --  1.08  CREATININE 0.80 0.71  TROPONINI <0.30  --     Estimated Creatinine Clearance: 69.6 ml/min (by C-G formula based on Cr of 0.71).   Medical History: Past Medical History  Diagnosis Date  . 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 Cervical cancer 1984  . Myocardial infarction 20 yrs ago  . Coronary artery disease   . Anxiety   . Chronic back pain   . Depression   . GERD (gastroesophageal reflux disease)   . Hypertension   . Hypothyroidism   . Anemia   . Vitamin B 12 deficiency   . Vitamin D deficiency disease     Medications:  Scheduled:  . atorvastatin  20 mg Oral Daily  . ceFEPime (MAXIPIME) IV  2 g Intravenous Q8H  . docusate sodium  100 mg Oral BID  . DULoxetine  30 mg Oral Daily  . enoxaparin (LOVENOX) injection  40 mg Subcutaneous Q24H  . fluticasone  1 spray Each Nare Daily  . ibuprofen  600 mg Oral TID  . isosorbide mononitrate  60 mg Oral Daily  . metoCLOPramide  5 mg Oral TID AC & HS  . sodium chloride  10 mL Intravenous Q12H  . vancomycin  1,000 mg Intravenous Q12H   Assessment: 67yo female recently diagnosed with adenocarcinoma and getting chemo. Pt c/o fever, frequent urination and redness over lower legs. Pt has good renal fxn. Labs reviewed, pancytopenia noted. Some fever and chills reported this  am.  Asked to initiate Lovenox for VTE prophylaxis. Goal of Therapy:  VTE px Monitor platelets by anticoagulation protocol: Yes   Plan:  Lovenox 40mg  SQ q24hrs Monitor CBC  Erika Moreno A 02/11/2013,11:55 AM

## 2013-02-11 NOTE — Progress Notes (Addendum)
[facility] PROGRESS NOTE  Erika Moreno NWG:[id number] DOB: [birth year] DOA: 02/10/2013 PCP: [name], MD  Assessment/Plan  Generalized weakness and dyspnea on exertion secondary to severe anemia, mildly improved after transfusion yesterday.  Hgb increased to 9.2mg /dl. -  Repeat CBC in AM -  PT/OT tomorrow.  She already has a lot of equipment for home -  OOB to chair later today.  Pancytopenia with severe anemia and thrombocytopenia and moderate leukopenia:  -  Epistaxis present, but mild -  Hgb increased appropriately post transfusion  Epistaxis, chronic and intermittent -  Nasal saline and vaseline -  Continue cold compresses for bleeding -  Afrin prn bleeding not responding to ongoing compression -  Outpatient ENT referral by PCP prn  Low-grade fever with possible acute sinusitis, UTI, possible cellulitis of lower extremities, having chills and low-grade fevers this morning. -  Continue vancomycin and cefepime -  F/u blood cultures -  F/u urine culture  Acute sinusitis:   -  Treat with antibiotics as discussed above.  -  Flonase will be prescribed.   Possible UTI: UA with positive nitrite, neg LE, 3-6 WBC, many bacteria.  -  F/u urine culture  Bilateral distal saphenous vein thrombophlebitis -  Start ibuprofen and continue compression stockings -  Continue elevation of legs  Adenocarcinoma of unknown primary: She is status post chemotherapy on October 22 and supposed to receive another dose 3 weeks from then.  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   Coronary artery disease, status post CABG 30 years ago. She's had 2 stents placed since then with the last one placed about 2 years ago.  -  Continue aspirin and plavix as long as platelet count is > 50K  Hyponatremia, sodium trending up  Diet:  Healthy heart Access:  PIV IVF:  off Proph:  lovenox unless plt trends lower than 50K, ongoing bleeding  Code  Status: full Family Communication: patient and her daughter Disposition Plan: declines SNF or rehab.  Plans to go home.  Will monitor blood cutlrues for minimum of 24 hours and reassess tomorrow.  Would like to go home ASAP.    Consultants:  None  Procedures:  CT head  CXR  Duplex lower extremities  Antibiotics:  Vancomycin 11/8>>  Cefepime 11/8 >>   HPI/Subjective:  Patient states that she still feels very fatigued and tired,however, she does feel a little better after her transfusion yesterday.  Has had smears of blood on tissue for a long time.      Objective: Filed Vitals:   02/11/13 0257 02/11/13 0346 02/11/13 0434 02/11/13 0536  BP: 111/71 98/53 100/56 112/88  Pulse: 99 88 78 80  Temp: 99.4 F (37.4 C) 99.5 F (37.5 C) 98.4 F (36.9 C) 99.2 F (37.3 C)  TempSrc:      Resp: 16 16 18 16   Height:      Weight:      SpO2:    95%    Intake/Output Summary (Last 24 hours) at 02/11/13 1116 Last data filed at 02/11/13 1041  Gross per 24 hour  Intake   1415 ml  Output   2150 ml  Net   -735 ml   Filed Weights   02/10/13 1422 02/10/13 1853  Weight: 65.772 kg (145 lb) 72.576 kg (160 lb)    Exam:   General:  CF, No acute distress  HEENT:  NCAT, MMM, crusted blood bilateral nares  Cardiovascular:  RRR, nl S1, S2 no mrg, 2+  pulses, warm extremities  Respiratory:  CTAB, no increased WOB  Abdomen:   NABS, soft, NT/ND  MSK:   Normal tone and bulk, very TTP, trace pitting edema bilaterally, has black eschars scattered on bilateral lower extremities with some surrounding erythema that is reportedly markedly improved since antibiotics were started.    Neuro:  Grossly intact  Data Reviewed: Basic Metabolic Panel:  Recent Labs Lab 02/10/13 1655 02/11/13 0641  NA 128* 130*  K 3.7 4.0  CL 93* 94*  CO2 25 27  GLUCOSE 115* 109*  BUN 22 15  CREATININE 0.80 0.71  CALCIUM 9.3 9.2   Liver Function Tests:  Recent Labs Lab 02/10/13 1655 02/11/13 0641   AST 24 20  ALT 19 18  ALKPHOS 91 96  BILITOT 0.6 1.0  PROT 6.7 6.8  ALBUMIN 3.2* 3.3*    Recent Labs Lab 02/10/13 1655  LIPASE 22   No results found for this basename: AMMONIA,  in the last 168 hours CBC:  Recent Labs Lab 02/10/13 1655 02/11/13 0641  WBC 3.7* 3.7*  NEUTROABS 1.6*  --   HGB 6.1* 9.2*  HCT 18.4* 27.5*  MCV 95.3 93.5  PLT 60* PLATELET CLUMPS NOTED ON SMEAR, COUNT APPEARS DECREASED   Cardiac Enzymes:  Recent Labs Lab 02/10/13 1655  TROPONINI <0.30   BNP (last 3 results)  Recent Labs  02/10/13 1655  PROBNP 193.2*   CBG: No results found for this basename: GLUCAP,  in the last 168 hours  Recent Results (from the past 240 hour(s))  CULTURE, BLOOD (ROUTINE X 2)     Status: None   Collection Time    02/10/13  5:57 PM      Result Value Range Status   Specimen Description Blood LEFT ANTECUBITAL   Final   Special Requests BOTTLES DRAWN AEROBIC AND ANAEROBIC 8CC   Final   Culture NO GROWTH 1 DAY   Final   Report Status PENDING   Incomplete  CULTURE, BLOOD (ROUTINE X 2)     Status: None   Collection Time    02/10/13  6:15 PM      Result Value Range Status   Specimen Description Blood BLOOD RIGHT HAND   Final   Special Requests BOTTLES DRAWN AEROBIC AND ANAEROBIC 8CC   Final   Culture NO GROWTH 1 DAY   Final   Report Status PENDING   Incomplete     Studies: Dg Chest 2 View  02/10/2013   CLINICAL DATA:  Fatigue and weakness. Stage IV carcinoma of unknown primary.  EXAM: CHEST  2 VIEW  COMPARISON:  None.  FINDINGS: Cardiac enlargement is stable. A right subclavian power port is stable in position as well. This is a somewhat lordotic view. No significant edema or effusion is present to suggest failure. The patient is status post median sternotomy for CABG. No focal mass lesion or airspace disease is evident. The visualized soft tissues and bony thorax is normal.  IMPRESSION: 1. Stable cardiomegaly without failure. 2. No acute cardiopulmonary disease  or significant interval change.   Electronically Signed   By: Gennette Pac M.D.   On: 02/10/2013 17:34   Ct Head Wo Contrast  02/10/2013   CLINICAL DATA:  Fatigue, history of breast cancer  EXAM: CT HEAD WITHOUT CONTRAST  TECHNIQUE: Contiguous axial images were obtained from the base of the skull through the vertex without intravenous contrast.  COMPARISON:  None.  FINDINGS: At least partial opacification left maxillary sinus. Chronic appearing inflammatory change involving the  right maxillary sinus also identified. There is no evidence of hemorrhage or extra-axial fluid. No abnormal attenuation is seen to suggest infarct. There is no hydrocephalus. No mass effect.  IMPRESSION: Sinusitis. No acute findings.   Electronically Signed   By: Esperanza Heir M.D.   On: 02/10/2013 17:14   US Venous Img Lower Bilateral  02/11/2013   CLINICAL DATA:  Swelling in bilateral lower extremities. History of cervical cancer and possible breast cancer.  EXAM: VENOUS DOPPLER ULTRASOUND OF BILATERAL LOWER EXTREMITIES  TECHNIQUE: Gray-scale sonography with graded compression, as well as color Doppler and duplex ultrasound, were performed to evaluate the deep venous system from the level of the common femoral vein through the popliteal and proximal calf veins. Spectral Doppler was utilized to evaluate flow at rest and with distal augmentation maneuvers.  COMPARISON:  None.  FINDINGS: Thrombus within deep veins:  None visualized.  Compressibility of deep veins:  Normal.  Duplex waveform respiratory phasicity:  Normal.  Duplex waveform response to augmentation:  Normal.  Venous reflux:  None visualized.  Other findings: There is incompressibility of the distal right greater saphenous vein below the level of the knee without color flow demonstrated consistent with superficial thrombophlebitis. There is incompressibility of the left mid and distal greater saphenous vein without color flow demonstrated consistent with superficial  thrombophlebitis.  IMPRESSION: No evidence of deep venous thrombosis of bilateral lower extremities.  Superficial thrombophlebitis of the distal right greater saphenous vein below the level of the knee.  Superficial thrombophlebitis of the mid-distal left greater saphenous vein.   Electronically Signed   By: Elige Ko   On: 02/11/2013 09:35    Scheduled Meds: . atorvastatin  20 mg Oral Daily  . ceFEPime (MAXIPIME) IV  1 g Intravenous Q8H  . docusate sodium  100 mg Oral BID  . DULoxetine  30 mg Oral Daily  . fluticasone  1 spray Each Nare Daily  . isosorbide mononitrate  60 mg Oral Daily  . metoCLOPramide  5 mg Oral TID AC & HS  . sodium chloride  10 mL Intravenous Q12H  . vancomycin  1,000 mg Intravenous Q12H   Continuous Infusions:   Principal Problem:   Dyspnea on exertion Active Problems:   Cancer of unknown origin   Cancer with unknown primary site   Acute sinusitis   Normocytic anemia   Antineoplastic chemotherapy induced pancytopenia   UTI (lower urinary tract infection)   Neuropathy due to chemotherapeutic drug    Time spent: 30 min    Davinci Glotfelty, [facility]  [facility] Pager [phone]. If 7PM-7AM, please contact night-coverage at [email or web address], password [facility] 02/11/2013, 11:16 AM  LOS: 1 day

## 2013-02-11 NOTE — Progress Notes (Signed)
ANTIBIOTIC CONSULT NOTE   Pharmacy Consult for Vancomycin and Cefepime Indication: UTI, sinusitis, cellulitis, fever, pancytopenia  Allergies  Allergen Reactions  . Morphine And Related     Hallucinates with large IV doses.  . Sulfa Antibiotics Itching   Patient Measurements: Height: 5\' 6"  (167.6 cm) Weight: 160 lb (72.576 kg) IBW/kg (Calculated) : 59.3  Vital Signs: Temp: 99.2 F (37.3 C) (11/09 0536) BP: 112/88 mmHg (11/09 0536) Pulse Rate: 80 (11/09 0536) Intake/Output from previous day: 11/08 0701 - 11/09 0700 In: 1205 [P.O.:240; Blood:715; IV Piggyback:250] Out: 2150 [Urine:2150] Intake/Output from this shift: Total I/O In: 210 [I.V.:10; IV Piggyback:200] Out: -   Labs:  Recent Labs  02/10/13 1655 02/11/13 0641  WBC 3.7* 3.7*  HGB 6.1* 9.2*  PLT 60* PLATELET CLUMPS NOTED ON SMEAR, COUNT APPEARS DECREASED  CREATININE 0.80 0.71   Estimated Creatinine Clearance: 69.6 ml/min (by C-G formula based on Cr of 0.71). No results found for this basename: VANCOTROUGH, Leodis Binet, VANCORANDOM, GENTTROUGH, GENTPEAK, GENTRANDOM, TOBRATROUGH, TOBRAPEAK, TOBRARND, AMIKACINPEAK, AMIKACINTROU, AMIKACIN,  in the last 72 hours   Microbiology: Recent Results (from the past 720 hour(s))  CULTURE, BLOOD (ROUTINE X 2)     Status: None   Collection Time    02/10/13  5:57 PM      Result Value Range Status   Specimen Description Blood LEFT ANTECUBITAL   Final   Special Requests BOTTLES DRAWN AEROBIC AND ANAEROBIC 8CC   Final   Culture NO GROWTH 1 DAY   Final   Report Status PENDING   Incomplete  CULTURE, BLOOD (ROUTINE X 2)     Status: None   Collection Time    02/10/13  6:15 PM      Result Value Range Status   Specimen Description Blood BLOOD RIGHT HAND   Final   Special Requests BOTTLES DRAWN AEROBIC AND ANAEROBIC 8CC   Final   Culture NO GROWTH 1 DAY   Final   Report Status PENDING   Incomplete   Medical History: Past Medical History  Diagnosis Date  . Cancer of unknown  origin 11/14/2012    Favor breast versus ovarian  . Cervical cancer 1984  . Myocardial infarction 20 yrs ago  . Coronary artery disease   . Anxiety   . Chronic back pain   . Depression   . GERD (gastroesophageal reflux disease)   . Hypertension   . Hypothyroidism   . Anemia   . Vitamin B 12 deficiency   . Vitamin D deficiency disease     Medications:  Scheduled:  . atorvastatin  20 mg Oral Daily  . ceFEPime (MAXIPIME) IV  2 g Intravenous Q8H  . docusate sodium  100 mg Oral BID  . DULoxetine  30 mg Oral Daily  . enoxaparin (LOVENOX) injection  40 mg Subcutaneous Q24H  . fluticasone  1 spray Each Nare Daily  . ibuprofen  600 mg Oral TID  . isosorbide mononitrate  60 mg Oral Daily  . metoCLOPramide  5 mg Oral TID AC & HS  . sodium chloride  10 mL Intravenous Q12H  . vancomycin  1,000 mg Intravenous Q12H   Assessment: 67yo female recently diagnosed with adenocarcinoma and getting chemo.  Pt c/o fever, frequent urination and redness over lower legs.  Pt has good renal fxn.  Labs reviewed, pancytopenia noted.  Some fever and chills reported this am.  Estimated Creatinine Clearance: 69.6 ml/min (by C-G formula based on Cr of 0.71).  Goal of Therapy:  Vancomycin trough level 15-20  mcg/ml  Plan:  Cefepime 2gm IV q8h Vancomycin 1gm IV q12h Check trough at steady state Monitor labs, renal fxn, and cultures  Valrie Hart A 02/11/2013,11:50 AM

## 2013-02-12 ENCOUNTER — Other Ambulatory Visit (HOSPITAL_COMMUNITY): Payer: Self-pay | Admitting: Oncology

## 2013-02-12 DIAGNOSIS — C801 Malignant (primary) neoplasm, unspecified: Secondary | ICD-10-CM

## 2013-02-12 DIAGNOSIS — I8000 Phlebitis and thrombophlebitis of superficial vessels of unspecified lower extremity: Secondary | ICD-10-CM

## 2013-02-12 LAB — TYPE AND SCREEN
Unit division: 0
Unit division: 0

## 2013-02-12 LAB — BASIC METABOLIC PANEL
BUN: 13 mg/dL (ref 6–23)
Chloride: 102 mEq/L (ref 96–112)
Creatinine, Ser: 0.56 mg/dL (ref 0.50–1.10)
GFR calc Af Amer: >90 mL/min (ref >90)
GFR calc non Af Amer: >90 mL/min (ref >90)
Potassium: 3.7 mEq/L (ref 3.5–5.1)

## 2013-02-12 LAB — CBC
HCT: 26.2 % — ABNORMAL LOW (ref 36.0–46.0)
MCHC: 33.6 g/dL (ref 30.0–36.0)
MCV: 95.3 fL (ref 78.0–100.0)
RDW: 18.7 % — ABNORMAL HIGH (ref 11.5–15.5)
WBC: 3.7 10*3/uL — ABNORMAL LOW (ref 4.0–10.5)

## 2013-02-12 LAB — VANCOMYCIN, TROUGH: Vancomycin Tr: <5 ug/mL — ABNORMAL LOW (ref 10.0–20.0)

## 2013-02-12 MED ORDER — SALINE SPRAY 0.65 % NA SOLN: 1.0000 | NASAL | Status: DC | PRN

## 2013-02-12 MED ORDER — OXYMETAZOLINE HCL 0.05 % NA SOLN
2.0000 | Freq: Two times a day (BID) | NASAL | Status: DC | PRN
  Administered 2013-02-12: 2 via NASAL

## 2013-02-12 MED ORDER — HEPARIN SOD (PORK) LOCK FLUSH 100 UNIT/ML IV SOLN
500.0000 [IU] | INTRAVENOUS | Status: DC | PRN
  Filled 2013-02-12: qty 5

## 2013-02-12 MED ORDER — SALINE SPRAY 0.65 % NA SOLN
1.0000 | NASAL | Status: DC | PRN
  Filled 2013-02-12: qty 44

## 2013-02-12 MED ORDER — OXYMETAZOLINE HCL 0.05 % NA SOLN: 2.0000 | Freq: Two times a day (BID) | NASAL | Status: DC | PRN

## 2013-02-12 MED ORDER — LORAZEPAM 1 MG PO TABS: 1.0000 mg | ORAL_TABLET | Freq: Four times a day (QID) | ORAL | Status: DC | PRN

## 2013-02-12 MED ORDER — LEVOFLOXACIN 750 MG PO TABS: 750.0000 mg | ORAL_TABLET | Freq: Every day | ORAL | Status: DC

## 2013-02-12 NOTE — Progress Notes (Signed)
UR chart review completed.  

## 2013-02-12 NOTE — Discharge Summary (Addendum)
Physician Discharge Summary  Erika Moreno GNF:[id number] DOB: [birth year] DOA: 02/10/2013  PCP: [name], MD  Admit date: 02/10/2013 Discharge date: 02/12/2013  Recommendations for Outpatient Follow-up:  1. Patient declined offer for home health services and equipment 2. Follow up within one week with oncologist for repeat CBC and BMP and examination of lower extremities. 3. Follow up pending urine and blood cultures.  Discharge Diagnoses:  Principal Problem:   Dyspnea on exertion Active Problems:   Cancer of unknown origin   Cancer with unknown primary site   Acute sinusitis   Normocytic anemia   Antineoplastic chemotherapy induced pancytopenia   UTI (lower urinary tract infection)   Neuropathy due to chemotherapeutic drug   Discharge Condition: stable, improved  Diet recommendation: healthy heart  Wt Readings from Last 3 Encounters:  02/10/13 72.576 kg (160 lb)  01/24/13 73.029 kg (161 lb)  01/03/13 70.398 kg (155 lb 3.2 oz)    History of present illness:   Erika Moreno is a 67 y.o. female with a past medical history of coronary artery disease, status post CABG about 30 years ago, recently diagnosed adenocarcinoma of unknown primary, who is getting chemotherapy with the last dose given on October 22. She received carboplatin and Taxol. She is supposed to get another dose of chemotherapy. 3 weeks from then. Patient also mentioned, that she's had that neuropathy from her chemotherapeutic agent and was started on Cymbalta. Over the last few weeks patient has been feeling weak because of the chemotherapy. And, then over the last few days she has noticed increased weakness. She would get really [place] of breath with exertion. This progressed so much that she had to come in to the hospital today. She has been having fever up to 101F. She denies any cough. The has had headache in the back, and facial pain and when she blows her nose she has noticed dry blood occasionally.  Denies any shortness of breath other than when she is exerting. Denies any diarrhea. Has been urinating a lot, but denies any dysuria. No vaginal discharge. No abdominal pain. She had some nausea, but no vomiting. She's had leg swelling, but she says that has improved. She does have some redness over her lower legs.   Hospital Course:   Generalized weakness and dyspnea on exertion secondary to severe anemia, improved after transfusion of 2 units of PRBC on 11/8.  Hgb increased from 6.1mg /dl to 9.2mg /dl.  She was repeatedly offered PT/OT assessment, particularly because of her unsteadiness and the risk of falling, however, she repeatedly declined the offer.  Also offered home health PT/OT assessment since she declined inpatient evaluation, however, she also declined that.  Stated that it would be too expensive.  Offered to have CM and SW meet with her to find out the cost and see if we could offer assistance, however, she was not interested.    Pancytopenia with severe anemia and thrombocytopenia and moderate leukopenia due to chemotherapy.  No petechiae/purpura or schistocytes to suggest DIC.  WBC remained stable, platelets trended up and hemoglobin increased with blood transfusion.    Epistaxis, chronic and intermittent.  Started nasal saline and vaseline in the nares.  Advised to continue cold compresses for bleeding.  Provided Rx for afrin to use if bleeding not stopping easily as temporizing measure.  Consider referral to ENT for evaluation and possible cautery/treatment of any suspicious areas.  Will treat sinusitis.    Low-grade fever with possible acute sinusitis, UTI, possible cellulitis of lower extremities.  Was started on vancomycin and cefepime.  She felt better, her temperature trended down and her chills resolved.  Her blood cultures are NGTD.  Her urine culture is still pending.  The erythema of her legs rapidly improved.   Acute sinusitis, treat with antibiotics as discussed above.  Will  not Rx flonase as this can increase the risk of epistaxis.    Bilateral distal saphenous vein thrombophlebitis.  Recommended TID ibuprofen, elevation of lower extremities, and compression stockings.    Adenocarcinoma of unknown primary: She is status post chemotherapy on October 22 and supposed to receive another dose 3 weeks from then.  I left a message with the Oncology office for her Oncologist or PA Kefalas to call me for information about [name], her admission, and her follow up.    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   Coronary artery disease, status post CABG 30 years ago. She's had 2 stents placed since then with the last one placed about 2 years ago.  Continue aspirin and plavix as long as platelet count is > 50K and no significant bleeding.  Hyponatremia, sodium trended up with gentle hydration and blood transfusion.  Consultants:  None Procedures:  CT head  CXR  Duplex lower extremities Antibiotics:  Vancomycin 11/8>>  Cefepime 11/8 >>    Discharge Exam: Filed Vitals:   02/12/13 0554  BP: 114/72  Pulse: 94  Temp: 98.7 F (37.1 C)  Resp: 20   Filed Vitals:   02/11/13 0536 02/11/13 1525 02/11/13 2142 02/12/13 0554  BP: 112/88 110/73 93/54 114/72  Pulse: 80 97 80 94  Temp: 99.2 F (37.3 C) 98.7 F (37.1 C) 98.5 F (36.9 C) 98.7 F (37.1 C)  TempSrc:      Resp: 16 16 18 20   Height:      Weight:      SpO2: 95% 99% 96% 97%   Feeling much better today and asking to go home.    General: CF, No acute distress  HEENT: NCAT, MMM, crusted blood bilateral nares  Cardiovascular: RRR, nl S1, S2 no mrg, 2+ pulses, warm extremities  Respiratory: CTAB, no increased WOB  Abdomen: NABS, soft, NT/ND  MSK: Normal tone and bulk, very TTP, trace pitting edema bilaterally, has black eschars scattered on bilateral lower extremities with almost completely resolved surrounding erythema Neuro: Grossly intact   Discharge Instructions       Discharge Orders   Future Appointments Provider Department Dept Phone   02/14/2013 9:45 AM [facility] [phone]   02/14/2013 3:00 PM Claudia Desanctis [facility] [phone]   Future Orders Complete By Expires   Call MD for:  difficulty breathing, headache or visual disturbances  As directed    Call MD for:  extreme fatigue  As directed    Call MD for:  hives  As directed    Call MD for:  persistant dizziness or light-headedness  As directed    Call MD for:  persistant nausea and vomiting  As directed    Call MD for:  severe uncontrolled pain  As directed    Call MD for:  temperature >100.4  As directed    Diet - low sodium heart healthy  As directed    Discharge instructions  As directed    Comments:     You were hospitalized with fever, fatigue, weakness, pain of the legs, sinus congestion, and bloody nose.  You were found to have a sinus infection and  a skin infection of your legs.  You were started on IV antibiotics and should continue antibiotics for 5 more days, starting today.  Please take levofloxacin once daily until all the tabs are gone.  You were also found to have superficial vein thrombophlebitis of your legs.  Please wear your compression stockings during the day and keep your legs elevated above the level of the waist when possible.  Please take ibuprofen 600mg  three time daily for the next 5 days to help reduce the chance that the clots lengthen.  Use ranitidine or tums for heart burn/indigestion related to the ibuprofen.  You were transfused two units of blood and should follow up with PA Kefalas on Wed for repeat anemia check.  They may want to monitor your blood counts more frequently with subsequent chemotherapy so that you can get transfused earlier and possibly not need to come to the hospital.  Please use your walker and other equipment at home for safety.  You are at HIGH risk of falls and we strongly encourage you to  reconsider physical and occupational therapy.  Your oncologist can assist with ordering these for you.  Please STOP your xanax and use ativan instead for sleep and anxiety.  Stop your blood pressure medication HCTZ-lisinopril because your illness has made your blood pressure decrease.  Your doctor can tell you when to restart this medication.  It is okay to resume your lasix and potassium tomorrow.   Increase activity slowly  As directed        Medication List    STOP taking these medications       ALPRAZolam 0.25 MG tablet  Commonly known as:  XANAX     lisinopril-hydrochlorothiazide 10-12.5 MG per tablet  Commonly known as:  PRINZIDE,ZESTORETIC      TAKE these medications       aspirin EC 81 MG tablet  Take 81 mg by mouth daily.     atorvastatin 20 MG tablet  Commonly known as:  LIPITOR  Take 20 mg by mouth daily.     clopidogrel 75 MG tablet  Commonly known as:  PLAVIX  Take 75 mg by mouth daily.     cyclobenzaprine 10 MG tablet  Commonly known as:  FLEXERIL  Take 10 mg by mouth 3 (three) times daily as needed for muscle spasms.     dexamethasone 2 MG tablet  Commonly known as:  DECADRON  Take 2 mg by mouth 2 (two) times daily with a meal. Days 2,3,4     Dexlansoprazole 30 MG capsule  Take 1 capsule (30 mg total) by mouth daily.     DULoxetine 30 MG capsule  Commonly known as:  CYMBALTA  Take 1 capsule (30 mg total) by mouth daily.     furosemide 20 MG tablet  Commonly known as:  LASIX  Take 20 mg by mouth daily as needed for fluid.     isosorbide mononitrate 60 MG 24 hr tablet  Commonly known as:  IMDUR  Take 60 mg by mouth daily.     levofloxacin 750 MG tablet  Commonly known as:  LEVAQUIN  Take 1 tablet (750 mg total) by mouth daily.     lidocaine-prilocaine cream  Commonly known as:  EMLA  Apply a quarter size amount to port site 1 hour prior to chemo. Do not rub in. Cover with plastic wrap.     LORazepam 1 MG tablet  Commonly known as:  ATIVAN   Take 1 tablet (1 mg total)  by mouth every 6 (six) hours as needed for anxiety or sleep.     metoCLOPramide 5 MG tablet  Commonly known as:  REGLAN  Take 1 tablet (5 mg total) by mouth 4 (four) times daily. Before meals and at bedtime.     nitroGLYCERIN 0.6 MG SL tablet  Commonly known as:  NITROSTAT  Place 0.6 mg under the tongue every 5 (five) minutes as needed for chest pain.     Oxycodone HCl 10 MG Tabs  Take 1 tablet (10 mg total) by mouth every 4 (four) hours as needed.     oxymetazoline 0.05 % nasal spray  Commonly known as:  AFRIN  Place 2 sprays into both nostrils 2 (two) times daily as needed (epistaxis not responding to cold compresses).     potassium chloride SA 20 MEQ tablet  Commonly known as:  K-DUR,KLOR-CON  Take 1 tablet (20 mEq total) by mouth 2 (two) times daily.     sodium chloride 0.65 % Soln nasal spray  Commonly known as:  OCEAN  Place 1 spray into both nostrils as needed for congestion.       Follow-up Information   Follow up with KEFALAS,THOMAS, PA-C On 02/14/2013. (Team B at 9:45 AM and Samuella Bruin at [facility])    Specialty:  Physician Assistant   Contact information:   [address] [place] [address] [phone]        The results of significant diagnostics from this hospitalization (including imaging, microbiology, ancillary and laboratory) are listed below for reference.    Significant Diagnostic Studies: Dg Chest 2 View  02/10/2013   CLINICAL DATA:  Fatigue and weakness. Stage IV carcinoma of unknown primary.  EXAM: CHEST  2 VIEW  COMPARISON:  None.  FINDINGS: Cardiac enlargement is stable. A right subclavian power port is stable in position as well. This is a somewhat lordotic view. No significant edema or effusion is present to suggest failure. The patient is status post median sternotomy for CABG. No focal mass lesion or airspace disease is evident. The visualized soft tissues and bony thorax is normal.  IMPRESSION: 1. Stable cardiomegaly  without failure. 2. No acute cardiopulmonary disease or significant interval change.   Electronically Signed   By: Gennette Pac M.D.   On: 02/10/2013 17:34   Ct Head Wo Contrast  02/10/2013   CLINICAL DATA:  Fatigue, history of breast cancer  EXAM: CT HEAD WITHOUT CONTRAST  TECHNIQUE: Contiguous axial images were obtained from the base of the skull through the vertex without intravenous contrast.  COMPARISON:  None.  FINDINGS: At least partial opacification left maxillary sinus. Chronic appearing inflammatory change involving the right maxillary sinus also identified. There is no evidence of hemorrhage or extra-axial fluid. No abnormal attenuation is seen to suggest infarct. There is no hydrocephalus. No mass effect.  IMPRESSION: Sinusitis. No acute findings.   Electronically Signed   By: Esperanza Heir M.D.   On: 02/10/2013 17:14   US Venous Img Lower Bilateral  02/11/2013   CLINICAL DATA:  Swelling in bilateral lower extremities. History of cervical cancer and possible breast cancer.  EXAM: VENOUS DOPPLER ULTRASOUND OF BILATERAL LOWER EXTREMITIES  TECHNIQUE: Gray-scale sonography with graded compression, as well as color Doppler and duplex ultrasound, were performed to evaluate the deep venous system from the level of the common femoral vein through the popliteal and proximal calf veins. Spectral Doppler was utilized to evaluate flow at rest and with distal augmentation maneuvers.  COMPARISON:  None.  FINDINGS:  Thrombus within deep veins:  None visualized.  Compressibility of deep veins:  Normal.  Duplex waveform respiratory phasicity:  Normal.  Duplex waveform response to augmentation:  Normal.  Venous reflux:  None visualized.  Other findings: There is incompressibility of the distal right greater saphenous vein below the level of the knee without color flow demonstrated consistent with superficial thrombophlebitis. There is incompressibility of the left mid and distal greater saphenous vein without  color flow demonstrated consistent with superficial thrombophlebitis.  IMPRESSION: No evidence of deep venous thrombosis of bilateral lower extremities.  Superficial thrombophlebitis of the distal right greater saphenous vein below the level of the knee.  Superficial thrombophlebitis of the mid-distal left greater saphenous vein.   Electronically Signed   By: Elige Ko   On: 02/11/2013 09:35    Microbiology: Recent Results (from the past 240 hour(s))  CULTURE, BLOOD (ROUTINE X 2)     Status: None   Collection Time    02/10/13  5:57 PM      Result Value Range Status   Specimen Description Blood LEFT ANTECUBITAL   Final   Special Requests BOTTLES DRAWN AEROBIC AND ANAEROBIC 8CC   Final   Culture NO GROWTH 1 DAY   Final   Report Status PENDING   Incomplete  CULTURE, BLOOD (ROUTINE X 2)     Status: None   Collection Time    02/10/13  6:15 PM      Result Value Range Status   Specimen Description Blood BLOOD RIGHT HAND   Final   Special Requests BOTTLES DRAWN AEROBIC AND ANAEROBIC 8CC   Final   Culture NO GROWTH 1 DAY   Final   Report Status PENDING   Incomplete     Labs: Basic Metabolic Panel:  Recent Labs Lab 02/10/13 1655 02/11/13 0641 02/12/13 0520  NA 128* 130* 137  K 3.7 4.0 3.7  CL 93* 94* 102  CO2 25 27 26   GLUCOSE 115* 109* 104*  BUN 22 15 13   CREATININE 0.80 0.71 0.56  CALCIUM 9.3 9.2 9.2   Liver Function Tests:  Recent Labs Lab 02/10/13 1655 02/11/13 0641  AST 24 20  ALT 19 18  ALKPHOS 91 96  BILITOT 0.6 1.0  PROT 6.7 6.8  ALBUMIN 3.2* 3.3*    Recent Labs Lab 02/10/13 1655  LIPASE 22   No results found for this basename: AMMONIA,  in the last 168 hours CBC:  Recent Labs Lab 02/10/13 1655 02/11/13 0641 02/11/13 1310 02/12/13 0520  WBC 3.7* 3.7* 3.6* 3.7*  NEUTROABS 1.6*  --  1.8  --   HGB 6.1* 9.2* 8.8* 8.8*  HCT 18.4* 27.5* 26.0* 26.2*  MCV 95.3 93.5 93.9 95.3  PLT 60* PLATELET CLUMPS NOTED ON SMEAR, COUNT APPEARS DECREASED 59* 67*    Cardiac Enzymes:  Recent Labs Lab 02/10/13 1655  TROPONINI <0.30   BNP: BNP (last 3 results)  Recent Labs  02/10/13 1655  PROBNP 193.2*   CBG: No results found for this basename: GLUCAP,  in the last 168 hours  Time coordinating discharge: 45 minutes  Signed:  Kagan Hietpas  [facility] 02/12/2013, 8:51 AM

## 2013-02-12 NOTE — Care Management Note (Signed)
    Page 1 of 1   02/12/2013     9:07:57 AM   CARE MANAGEMENT NOTE 02/12/2013  Patient:  Erika Moreno, Erika Moreno   Account Number:  [id number]  Date Initiated:  02/12/2013  Documentation initiated by:  Sharrie Rothman  Subjective/Objective Assessment:   Pt admitted from home with weakness 2nd to anemia. Pt lives with family and will return home at discharge. Pt is independent with ADL's. Pt has cane and walker for prn use.     Action/Plan:   Pt discharged home today. No CM needs noted.   Anticipated DC Date:  02/12/2013   Anticipated DC Plan:  HOME/SELF CARE      DC Planning Services  CM consult      Choice offered to / List presented to:             Status of service:  Completed, signed off Medicare Important Message given?  NA - LOS <3 / Initial given by admissions (If response is "NO", the following Medicare IM given date fields will be blank) Date Medicare IM given:   Date Additional Medicare IM given:    Discharge Disposition:  HOME/SELF CARE  Per UR Regulation:    If discussed at Long Length of Stay Meetings, dates discussed:    Comments:  02/12/13 0907 Arlyss Queen, RN BSN CM

## 2013-02-12 NOTE — Progress Notes (Signed)
Patient discharged home this am,with instructions given on discharge medications and follow up visits,patient verbalized understanding.Prescription sent with patient.No c/o pain or discomfort noted.Accompanied by staff to an awaiting vehicle.

## 2013-02-13 LAB — URINE CULTURE: Culture: >=100000

## 2013-02-13 MED ORDER — AMOXICILLIN-POT CLAVULANATE 875-125 MG PO TABS: 1.0000 | ORAL_TABLET | Freq: Two times a day (BID) | ORAL | Status: DC

## 2013-02-13 NOTE — Telephone Encounter (Signed)
Urine culture resistant to FQ.  Changed to augmentin instead for 7 day course.  Sent new prescription to pharmacy.

## 2013-02-14 ENCOUNTER — Encounter (HOSPITAL_COMMUNITY): Payer: Self-pay | Admitting: Oncology

## 2013-02-14 ENCOUNTER — Encounter (HOSPITAL_COMMUNITY): Payer: Self-pay | Admitting: Hematology and Oncology

## 2013-02-14 ENCOUNTER — Encounter (HOSPITAL_COMMUNITY): Payer: PRIVATE HEALTH INSURANCE | Attending: Internal Medicine | Admitting: Oncology

## 2013-02-14 ENCOUNTER — Inpatient Hospital Stay (HOSPITAL_COMMUNITY): Payer: PRIVATE HEALTH INSURANCE

## 2013-02-14 VITALS — BP 128/81 | HR 94 | Temp 98.8°F | Resp 20 | Wt 165.0 lb

## 2013-02-14 DIAGNOSIS — L0390 Cellulitis, unspecified: Secondary | ICD-10-CM

## 2013-02-14 DIAGNOSIS — G609 Hereditary and idiopathic neuropathy, unspecified: Secondary | ICD-10-CM

## 2013-02-14 DIAGNOSIS — N390 Urinary tract infection, site not specified: Secondary | ICD-10-CM | POA: Insufficient documentation

## 2013-02-14 DIAGNOSIS — A498 Other bacterial infections of unspecified site: Secondary | ICD-10-CM

## 2013-02-14 DIAGNOSIS — T451X5A Adverse effect of antineoplastic and immunosuppressive drugs, initial encounter: Secondary | ICD-10-CM | POA: Insufficient documentation

## 2013-02-14 DIAGNOSIS — D649 Anemia, unspecified: Secondary | ICD-10-CM | POA: Insufficient documentation

## 2013-02-14 DIAGNOSIS — L0291 Cutaneous abscess, unspecified: Secondary | ICD-10-CM

## 2013-02-14 DIAGNOSIS — D6481 Anemia due to antineoplastic chemotherapy: Secondary | ICD-10-CM | POA: Insufficient documentation

## 2013-02-14 DIAGNOSIS — R609 Edema, unspecified: Secondary | ICD-10-CM

## 2013-02-14 DIAGNOSIS — R110 Nausea: Secondary | ICD-10-CM | POA: Insufficient documentation

## 2013-02-14 DIAGNOSIS — C801 Malignant (primary) neoplasm, unspecified: Secondary | ICD-10-CM | POA: Insufficient documentation

## 2013-02-14 LAB — CBC WITH DIFFERENTIAL/PLATELET
Basophils Relative: 0 % (ref 0–1)
Eosinophils Absolute: 0 10*3/uL (ref 0.0–0.7)
Hemoglobin: 7.7 g/dL — ABNORMAL LOW (ref 12.0–15.0)
Lymphs Abs: 1.2 10*3/uL (ref 0.7–4.0)
MCH: 31.8 pg (ref 26.0–34.0)
MCHC: 32.9 g/dL (ref 30.0–36.0)
Monocytes Relative: 10 % (ref 3–12)
Neutro Abs: 1.7 10*3/uL (ref 1.7–7.7)
Neutrophils Relative %: 53 % (ref 43–77)
Platelets: 92 10*3/uL — ABNORMAL LOW (ref 150–400)
RBC: 2.42 MIL/uL — ABNORMAL LOW (ref 3.87–5.11)
RDW: 19.5 % — ABNORMAL HIGH (ref 11.5–15.5)
Smear Review: DECREASED

## 2013-02-14 LAB — COMPREHENSIVE METABOLIC PANEL
ALT: 20 U/L (ref 0–35)
AST: 27 U/L (ref 0–37)
Albumin: 3.4 g/dL — ABNORMAL LOW (ref 3.5–5.2)
Alkaline Phosphatase: 93 U/L (ref 39–117)
Calcium: 9.5 mg/dL (ref 8.4–10.5)
Chloride: 99 mEq/L (ref 96–112)
Glucose, Bld: 100 mg/dL — ABNORMAL HIGH (ref 70–99)
Potassium: 4 mEq/L (ref 3.5–5.1)
Sodium: 134 mEq/L — ABNORMAL LOW (ref 135–145)
Total Bilirubin: 0.6 mg/dL (ref 0.3–1.2)
Total Protein: 7 g/dL (ref 6.0–8.3)

## 2013-02-14 LAB — IRON AND TIBC
Iron: 71 ug/dL (ref 42–135)
Saturation Ratios: 24 % (ref 20–55)
UIBC: 221 ug/dL (ref 125–400)

## 2013-02-14 MED ORDER — SODIUM CHLORIDE 0.9 % IJ SOLN
20.0000 mL | Freq: Once | INTRAMUSCULAR | Status: AC
  Administered 2013-02-14: 20 mL via INTRAVENOUS

## 2013-02-14 MED ORDER — DTAP-HEPATITIS B RECOMB-IPV IM SUSP: 0.5000 mL | Freq: Once | INTRAMUSCULAR | Status: DC

## 2013-02-14 MED ORDER — HEPARIN SOD (PORK) LOCK FLUSH 100 UNIT/ML IV SOLN
500.0000 [IU] | Freq: Once | INTRAVENOUS | Status: AC
  Administered 2013-02-14: 500 [IU] via INTRAVENOUS
  Filled 2013-02-14: qty 5

## 2013-02-14 NOTE — Addendum Note (Signed)
Addended by: Ellouise Newer on: 02/14/2013 03:00 PM   Modules accepted: Orders

## 2013-02-14 NOTE — Progress Notes (Addendum)
[name], MD [address] [place] [address]  Cancer of unknown origin - Plan: CBC with Differential, Comprehensive metabolic panel, CBC with Differential, Comprehensive metabolic panel, sodium chloride 0.9 % injection 20 mL, heparin lock flush 100 unit/mL  Cancer with unknown primary site - Plan: CBC with Differential, Comprehensive metabolic panel, CBC with Differential, Comprehensive metabolic panel  CURRENT THERAPY: S/P 3 cycles of Carboplatin/Paclitaxel beginning on 12/12/2012 preceded by 2 cycles of weekly Carboplatin/Paclitaxel on 8/13 and 11/22/2012 in the neoadjuvant setting.  INTERVAL HISTORY: Erika Moreno 67 y.o. female returns for  regular  visit for followup of unknown primary adenocarcinoma presenting with abdominal disease, complete response by CT scan to first 2 full cycles of carboplatin and Taxol preceded by 2 weekly treatments with carboplatin and Taxol.  Unfortunately, Erika Moreno was admitted to the [facility] on 02/10/2013 and discharged on 02/12/2013. She was diagnosed with cellulitis and treated with antibiotics with rapid improvement.  Additionally, she was noted to have a Hgb of 6.1 g/dL.  She received a 2 unit PRBC transfusion during her hospitalization.  Blood cultures are negative x 4 days.  Urine culture is positive for E.Coli with only intermediate sensitivity to Ampicillin.  She was placed on PO Levaquin but her urine cultures came back as resistant to Levaquin.  Augmentin was therefore prescribed to her and she started that yesterday.  Although Augmentin shows Intermediate activity, it is one of the safer antibiotics given her past blood counts.  If this does not work, will switch to [employer].  Additionally, we will repeat urine culture in 1 week to evaluate for resolution of UTI.    Due to her hospitalization, her chemotherapy regimen was deferred 1 week to allow for recovery time.   Due to her hospitalization with worsening anemia and  thrombocytopenia I will reduce Carboplatin dose by 15%.  I will maintain an AUC of 5 calculated dose and will decrease calculated dose by 15%.  She did display a leukopenia, but ANC was adequate and she was not found to be neutropenic.    I provided the patient education regarding dose reductions and these reductions are reasonable when blood counts are slow to recover following chemotherapy administration. This dose reduction, hopefully, will help her tolerate therapy better, yet still remain effective.   She is agreeable to this plan.  She admits to nausea, but this is well controlled with home anti-emetics she reports.  She denies any emeses.  Oncologically, she denies any complaints and ROS questioning is negative.     Past Medical History  Diagnosis Date  . 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 Cervical cancer 1984  . Myocardial infarction 20 yrs ago  . Coronary artery disease   . Anxiety   . Chronic back pain   . Depression   . GERD (gastroesophageal reflux disease)   . Hypertension   . Hypothyroidism   . Anemia   . Vitamin B 12 deficiency   . Vitamin D deficiency disease     has Cancer of unknown origin; Cancer with unknown primary site; Anemia due to chemotherapy; Acute sinusitis; Normocytic anemia; Antineoplastic chemotherapy induced pancytopenia; UTI (lower urinary tract infection); Dyspnea on exertion; Neuropathy due to chemotherapeutic drug; and Thrombophlebitis leg superficial on her problem list.     is allergic to morphine and related and sulfa antibiotics.  Erika Moreno does not currently have medications on file.  Past Surgical History  Procedure Laterality  Date  . Heart bypass      triple bypass with stents  . Coronary artery bypass graft      3 vessels  . Cardiac catheterization      stents x3  . Abdominal hysterectomy    . Portacath placement Right 12/06/2012    Procedure: INSERTION PORT-A-CATH;  Surgeon: Fabio Bering,  MD;  Location: [facility];  Service: General;  Laterality: Right;    Denies any headaches, dizziness, double vision, fevers, chills, night sweats, nausea, vomiting, diarrhea, constipation, chest pain, heart palpitations, shortness of breath, blood in stool, black tarry stool, urinary pain, urinary burning, urinary frequency, hematuria.   PHYSICAL EXAMINATION  ECOG PERFORMANCE STATUS: 1 - Symptomatic but completely ambulatory  Filed Vitals:   02/14/13 1324  BP: 128/81  Pulse: 94  Temp: 98.8 F (37.1 C)  Resp: 20    GENERAL:alert, cooperative and smiling SKIN: skin color, texture, turgor are normal HEAD: Normocephalic, No masses, lesions, tenderness or abnormalities EYES: normal, PERRLA, EOMI, Conjunctiva are pink and non-injected EARS: External ears normal OROPHARYNX:mucous membranes are moist  NECK: supple, no adenopathy, thyroid normal size, non-tender, without nodularity, no stridor, non-tender, trachea midline LYMPH:  no palpable lymphadenopathy, no hepatosplenomegaly BREAST:not examined LUNGS: clear to auscultation and percussion HEART: regular rate & rhythm, no murmurs, no gallops, S1 normal and S2 normal ABDOMEN:abdomen soft, non-tender, normal bowel sounds, no masses or organomegaly and no hepatosplenomegaly BACK: Back symmetric, no curvature., No CVA tenderness EXTREMITIES:positive findings:  edema B/L LE edema and resolving erythema with a healing wound on the left leg on lateral side. NEURO: alert & oriented x 3 with fluent speech, no focal motor/sensory deficits   LABORATORY DATA: CBC    Component Value Date/Time   WBC 3.7* 02/12/2013 0520   RBC 2.75* 02/12/2013 0520   HGB 8.8* 02/12/2013 0520   HCT 26.2* 02/12/2013 0520   PLT 67* 02/12/2013 0520   MCV 95.3 02/12/2013 0520   MCH 32.0 02/12/2013 0520   MCHC 33.6 02/12/2013 0520   RDW 18.7* 02/12/2013 0520   LYMPHSABS 1.2 02/11/2013 1310   MONOABS 0.6 02/11/2013 1310   EOSABS 0.0 02/11/2013 1310   BASOSABS 0.0  02/11/2013 1310      Chemistry      Component Value Date/Time   NA 137 02/12/2013 0520   K 3.7 02/12/2013 0520   CL 102 02/12/2013 0520   CO2 26 02/12/2013 0520   BUN 13 02/12/2013 0520   CREATININE 0.56 02/12/2013 0520      Component Value Date/Time   CALCIUM 9.2 02/12/2013 0520   ALKPHOS 96 02/11/2013 0641   AST 20 02/11/2013 0641   ALT 18 02/11/2013 0641   BILITOT 1.0 02/11/2013 0641        PENDING LABS: CBC diff, CMET     ASSESSMENT:  1. 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 2. Peripheral neuropathy, improved on Cymbalta 3. LE edema, improved on regular [place] of furosemide plus potassium supplements. 4. Remote history of cervical cancer, no evidence of disease. 5. Anemia secondary chemotherapy with no gross evidence of bleeding 6. Acute worsening of Anemia, in setting of infection, ?reactive versus chemotherapy-induced 7. Cellulitis, on Augmentin 8. E.Coli UTI, on Augmentin.  Will re-culture in 7 days.   Patient Active Problem List   Diagnosis Date Noted  . Thrombophlebitis leg superficial 02/12/2013  . Acute sinusitis 02/10/2013  .  Normocytic anemia 02/10/2013  . Antineoplastic chemotherapy induced pancytopenia 02/10/2013  . UTI (lower urinary tract infection) 02/10/2013  . Dyspnea on exertion 02/10/2013  . Neuropathy due to chemotherapeutic drug 02/10/2013  . Anemia due to chemotherapy 01/24/2013  . Cancer with unknown primary site 11/20/2012  . Cancer of unknown origin 11/14/2012     PLAN:  1. I personally reviewed and went over laboratory results with the patient. 2. I personally reviewed and went over radiographic studies with the patient. 3. Chart reviewed from hospital admission 4. Chemotherapy deferred x 7 days 5. Labs today: CBC diff, CMET 6. Labs pre-chemo in 7 days: CBC diff, CMET 7.  Urine culture in 1 week (when she is here for chemotherapy).  9. Carboplatin dose reduced by 15% for next cycle. 10. Follow-up appointment in 1 week during chemotherapy to make sure she is feeling well.  Return in 3 weeks for follow-up.   THERAPY PLAN:  Chemotherapy was deferred x 7 days due to hospitalization.  We will check labs today.  She will continue with Augmentin as prescribed and we will re-culture her urine in 1 week to evaluate for resolution.  If Augmentin is ineffective, will use Macrobid. Cycle 4 is upcoming.   All questions were answered. The patient knows to call the clinic with any problems, questions or concerns. We can certainly see the patient much sooner if necessary.  Patient and plan discussed with Dr. Annamarie Dawley and she is in agreement with the aforementioned.   Erika Moreno    Addendum:  Will add Iron/TIBC, and Ferritin to today's labs  [facility]

## 2013-02-14 NOTE — Patient Instructions (Signed)
[facility] Discharge Instructions  Return in one week for chemotherapy - Elijah Birk will see you on this day, too. Return for follow-up visit with Dr. Zigmund Daniel three weeks from today.  Thank you for choosing [facility] to provide your oncology and hematology care.  To afford each patient quality time with our providers, please arrive at least 15 minutes before your scheduled appointment time.  With your help, our goal is to use those 15 minutes to complete the necessary work-up to ensure our physicians have the information they need to help with your evaluation and healthcare recommendations.    Effective January 1st, 2014, we ask that you re-schedule your appointment with our physicians should you arrive 10 or more minutes late for your appointment.  We strive to give you quality time with our providers, and arriving late affects you and other patients whose appointments are after yours.    Again, thank you for choosing [facility].  Our hope is that these requests will decrease the amount of time that you wait before being seen by our physicians.       _____________________________________________________________  Should you have questions after your visit to [facility], please contact our office at [phone] between the hours of 8:30 a.m. and 5:00 p.m.  Voicemails left after 4:30 p.m. will not be returned until the following business day.  For prescription refill requests, have your pharmacy contact our office with your prescription refill request.

## 2013-02-15 ENCOUNTER — Other Ambulatory Visit (HOSPITAL_COMMUNITY): Payer: Self-pay | Admitting: Oncology

## 2013-02-15 DIAGNOSIS — D6481 Anemia due to antineoplastic chemotherapy: Secondary | ICD-10-CM

## 2013-02-15 LAB — CULTURE, BLOOD (ROUTINE X 2): Culture: NO GROWTH

## 2013-02-15 LAB — FERRITIN: Ferritin: 992 ng/mL — ABNORMAL HIGH (ref 10–291)

## 2013-02-16 ENCOUNTER — Encounter (HOSPITAL_BASED_OUTPATIENT_CLINIC_OR_DEPARTMENT_OTHER): Payer: PRIVATE HEALTH INSURANCE

## 2013-02-16 VITALS — BP 122/67 | HR 99 | Temp 99.0°F | Resp 16

## 2013-02-16 DIAGNOSIS — T451X5A Adverse effect of antineoplastic and immunosuppressive drugs, initial encounter: Secondary | ICD-10-CM

## 2013-02-16 DIAGNOSIS — D6481 Anemia due to antineoplastic chemotherapy: Secondary | ICD-10-CM

## 2013-02-16 MED ORDER — SODIUM CHLORIDE 0.9 % IJ SOLN
10.0000 mL | INTRAMUSCULAR | Status: AC | PRN
  Administered 2013-02-16: 10 mL

## 2013-02-16 MED ORDER — HEPARIN SOD (PORK) LOCK FLUSH 100 UNIT/ML IV SOLN
500.0000 [IU] | Freq: Every day | INTRAVENOUS | Status: AC | PRN
  Administered 2013-02-16: 500 [IU]
  Filled 2013-02-16: qty 5

## 2013-02-16 MED ORDER — SODIUM CHLORIDE 0.9 % IV SOLN
250.0000 mL | Freq: Once | INTRAVENOUS | Status: AC
  Administered 2013-02-16: 250 mL via INTRAVENOUS

## 2013-02-16 NOTE — Progress Notes (Signed)
Received 2 units PRBC's.Tolerated transfusion well.

## 2013-02-17 LAB — TYPE AND SCREEN
ABO/RH(D): A POS
Antibody Screen: NEGATIVE
Unit division: 0

## 2013-02-20 NOTE — Progress Notes (Signed)
[name], MD [address] [place] [address]  Cancer of unknown origin  CURRENT THERAPY: S/P 3 cycles of Carboplatin/Paclitaxel beginning on 12/12/2012 preceded by 2 cycles of weekly Carboplatin/Paclitaxel on 8/13 and 11/22/2012 in the neoadjuvant setting.  INTERVAL HISTORY: Erika Moreno 67 y.o. female returns for  regular  visit for followup of unknown primary adenocarcinoma presenting with abdominal disease, complete response by CT scan to first 2 full cycles of carboplatin and Taxol preceded by 2 weekly treatments with carboplatin and Taxol.  Unfortunately, Erika Moreno was admitted to the [facility] on 02/10/2013 and discharged on 02/12/2013. She was diagnosed with cellulitis and treated with antibiotics with rapid improvement.  Additionally, she was noted to have a Hgb of 6.1 g/dL.  She received a 2 unit PRBC transfusion during her hospitalization.  Blood cultures are negative x 4 days.  Urine culture is positive for E.Coli with only intermediate sensitivity to Ampicillin.  She was placed on PO Levaquin but her urine cultures came back as resistant to Levaquin.  Augmentin was therefore prescribed to her and she started that yesterday.  Although Augmentin shows Intermediate activity, it is one of the safer antibiotics given her past blood counts.  If this does not work, will switch to [employer].  Additionally, we will repeat urine culture in 1 week to evaluate for resolution of UTI.    Due to her hospitalization, her chemotherapy regimen was deferred 1 week to allow for recovery time.   Due to her hospitalization with worsening anemia and thrombocytopenia I will reduce Carboplatin dose by 15%.  I will maintain an AUC of 5 calculated dose and will decrease calculated dose by 15%.  She did display a leukopenia, but ANC was adequate and she was not found to be neutropenic.    Today I wanted to see how the patient is doing before moving on with chemotherapy.  Her Hgb was noted to be 7.7 g/dL  the dat eof our last encounter.  She subsequently received a 2 unit PRBC transfusion.  She reports that she feels better since her transfusion.    As long as treatment parameters are met, she will undergo therapy as scheduled.   Past Medical History  Diagnosis Date  . 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 Cervical cancer 1984  . Myocardial infarction 20 yrs ago  . Coronary artery disease   . Anxiety   . Chronic back pain   . Depression   . GERD (gastroesophageal reflux disease)   . Hypertension   . Hypothyroidism   . Anemia   . Vitamin B 12 deficiency   . Vitamin D deficiency disease     has Cancer of unknown origin; Anemia due to chemotherapy; Acute sinusitis; Normocytic anemia; Antineoplastic chemotherapy induced pancytopenia; UTI (lower urinary tract infection); Dyspnea on exertion; Neuropathy due to chemotherapeutic drug; and Thrombophlebitis leg superficial on her problem list.     is allergic to morphine and related and sulfa antibiotics.  Erika Moreno had no medications administered during this visit.  Past Surgical History  Procedure Laterality Date  . Heart bypass      triple bypass with stents  . Coronary artery bypass graft      3 vessels  . Cardiac catheterization      stents x3  . Abdominal hysterectomy    . Portacath placement Right 12/06/2012    Procedure: INSERTION PORT-A-CATH;  Surgeon: Fabio Bering, MD;  Location: [facility];  Service: General;  Laterality: Right;    Denies any headaches, dizziness, double vision, fevers, chills, night sweats, nausea, vomiting, diarrhea, constipation, chest pain, heart palpitations, shortness of breath, blood in stool, black tarry stool, urinary pain, urinary burning, urinary frequency, hematuria.   PHYSICAL EXAMINATION  ECOG PERFORMANCE STATUS: 1 - Symptomatic but completely ambulatory  There were no vitals filed for this visit.  GENERAL:alert, cooperative and smiling SKIN: skin color,  texture, turgor are normal HEAD: Normocephalic, No masses, lesions, tenderness or abnormalities EYES: normal, PERRLA, EOMI, Conjunctiva are pink and non-injected EARS: External ears normal OROPHARYNX:mucous membranes are moist  NECK: supple, trachea midline LYMPH:  Not examined BREAST:not examined LUNGS: not examined HEART: no examined ABDOMEN: not examined BACK: Back symmetric, no curvature., No CVA tenderness EXTREMITIES:positive findings:  edema B/L LE edema and resolving erythema with a healing wound on the left leg on lateral side. NEURO: alert & oriented x 3 with fluent speech, no focal motor/sensory deficits   LABORATORY DATA: CBC    Component Value Date/Time   WBC 3.4* 02/21/2013 0825   RBC 2.92* 02/21/2013 0825   HGB 9.1* 02/21/2013 0825   HCT 27.7* 02/21/2013 0825   PLT 146* 02/21/2013 0825   MCV 94.9 02/21/2013 0825   MCH 31.2 02/21/2013 0825   MCHC 32.9 02/21/2013 0825   RDW 19.2* 02/21/2013 0825   LYMPHSABS 1.2 02/21/2013 0825   MONOABS 0.4 02/21/2013 0825   EOSABS 0.1 02/21/2013 0825   BASOSABS 0.0 02/21/2013 0825      Chemistry      Component Value Date/Time   NA 134* 02/21/2013 0825   K 3.6 02/21/2013 0825   CL 97 02/21/2013 0825   CO2 28 02/21/2013 0825   BUN 21 02/21/2013 0825   CREATININE 0.77 02/21/2013 0825      Component Value Date/Time   CALCIUM 9.7 02/21/2013 0825   ALKPHOS 87 02/21/2013 0825   AST 23 02/21/2013 0825   ALT 11 02/21/2013 0825   BILITOT 0.5 02/21/2013 0825        PENDING LABS: CBC diff, CMET     ASSESSMENT:  1. 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 2. Peripheral neuropathy, improved on Cymbalta 3. LE edema, improved on regular [place] of furosemide plus potassium supplements. 4. Remote history of cervical cancer, no evidence of disease. 5. Anemia secondary  chemotherapy with no gross evidence of bleeding 6. Acute worsening of Anemia, in setting of infection, ?reactive versus chemotherapy-induced 7. Cellulitis, on Augmentin 8. E.Coli UTI, on Augmentin.  Will re-culture in 7 days.   Patient Active Problem List   Diagnosis Date Noted  . Thrombophlebitis leg superficial 02/12/2013  . Acute sinusitis 02/10/2013  . Normocytic anemia 02/10/2013  . Antineoplastic chemotherapy induced pancytopenia 02/10/2013  . UTI (lower urinary tract infection) 02/10/2013  . Dyspnea on exertion 02/10/2013  . Neuropathy due to chemotherapeutic drug 02/10/2013  . Anemia due to chemotherapy 01/24/2013  . Cancer of unknown origin 11/14/2012     PLAN:  1. I personally reviewed and went over laboratory results with the patient. 2. I personally reviewed and went over radiographic studies with the patient. 3. Urine culture today.   4. Carboplatin dose reduced by 15% for cycle 4. 5. Return in 3 weeks for follow-up as scheduled.   THERAPY PLAN:  We will check labs today.  She will continue with Augmentin as prescribed and  we will re-culture her urine today to evaluate for resolution.  If Augmentin is ineffective, will use Macrobid. Cycle 4 today as scheduled with Carboplatin reduced by 15%.   All questions were answered. The patient knows to call the clinic with any problems, questions or concerns. We can certainly see the patient much sooner if necessary.  Patient and plan discussed with Dr. Alla German and he is in agreement with the aforementioned.    KEFALAS,THOMAS

## 2013-02-21 ENCOUNTER — Encounter (HOSPITAL_BASED_OUTPATIENT_CLINIC_OR_DEPARTMENT_OTHER): Payer: PRIVATE HEALTH INSURANCE

## 2013-02-21 ENCOUNTER — Encounter (HOSPITAL_BASED_OUTPATIENT_CLINIC_OR_DEPARTMENT_OTHER): Payer: PRIVATE HEALTH INSURANCE | Admitting: Oncology

## 2013-02-21 ENCOUNTER — Encounter (HOSPITAL_COMMUNITY): Payer: Self-pay | Admitting: Oncology

## 2013-02-21 VITALS — BP 128/74 | HR 98 | Temp 97.7°F | Resp 18 | Wt 162.0 lb

## 2013-02-21 DIAGNOSIS — A498 Other bacterial infections of unspecified site: Secondary | ICD-10-CM

## 2013-02-21 DIAGNOSIS — L0390 Cellulitis, unspecified: Secondary | ICD-10-CM

## 2013-02-21 DIAGNOSIS — L0291 Cutaneous abscess, unspecified: Secondary | ICD-10-CM

## 2013-02-21 DIAGNOSIS — D6481 Anemia due to antineoplastic chemotherapy: Secondary | ICD-10-CM

## 2013-02-21 DIAGNOSIS — C801 Malignant (primary) neoplasm, unspecified: Secondary | ICD-10-CM

## 2013-02-21 DIAGNOSIS — Z5111 Encounter for antineoplastic chemotherapy: Secondary | ICD-10-CM

## 2013-02-21 DIAGNOSIS — N390 Urinary tract infection, site not specified: Secondary | ICD-10-CM

## 2013-02-21 LAB — COMPREHENSIVE METABOLIC PANEL
AST: 23 U/L (ref 0–37)
BUN: 21 mg/dL (ref 6–23)
CO2: 28 mEq/L (ref 19–32)
Calcium: 9.7 mg/dL (ref 8.4–10.5)
Chloride: 97 mEq/L (ref 96–112)
Creatinine, Ser: 0.77 mg/dL (ref 0.50–1.10)
GFR calc Af Amer: >90 mL/min (ref >90)
GFR calc non Af Amer: 85 mL/min — ABNORMAL LOW (ref >90)
Glucose, Bld: 131 mg/dL — ABNORMAL HIGH (ref 70–99)
Total Protein: 6.9 g/dL (ref 6.0–8.3)

## 2013-02-21 LAB — CBC WITH DIFFERENTIAL/PLATELET
Basophils Absolute: 0 10*3/uL (ref 0.0–0.1)
Eosinophils Absolute: 0.1 10*3/uL (ref 0.0–0.7)
Eosinophils Relative: 2 % (ref 0–5)
HCT: 27.7 % — ABNORMAL LOW (ref 36.0–46.0)
Hemoglobin: 9.1 g/dL — ABNORMAL LOW (ref 12.0–15.0)
Lymphocytes Relative: 35 % (ref 12–46)
Lymphs Abs: 1.2 10*3/uL (ref 0.7–4.0)
MCH: 31.2 pg (ref 26.0–34.0)
MCV: 94.9 fL (ref 78.0–100.0)
Monocytes Absolute: 0.4 10*3/uL (ref 0.1–1.0)
Monocytes Relative: 13 % — ABNORMAL HIGH (ref 3–12)
Platelets: 146 10*3/uL — ABNORMAL LOW (ref 150–400)
RDW: 19.2 % — ABNORMAL HIGH (ref 11.5–15.5)
WBC: 3.4 10*3/uL — ABNORMAL LOW (ref 4.0–10.5)

## 2013-02-21 MED ORDER — PACLITAXEL CHEMO INJECTION 300 MG/50ML
175.0000 mg/m2 | Freq: Once | INTRAVENOUS | Status: AC
  Administered 2013-02-21: 306 mg via INTRAVENOUS
  Filled 2013-02-21: qty 51

## 2013-02-21 MED ORDER — DEXAMETHASONE SODIUM PHOSPHATE 10 MG/ML IJ SOLN: 20.0000 mg | Freq: Once | INTRAMUSCULAR | Status: DC

## 2013-02-21 MED ORDER — SODIUM CHLORIDE 0.9 % IV SOLN
Freq: Once | INTRAVENOUS | Status: AC
  Administered 2013-02-21: 16 mg via INTRAVENOUS
  Filled 2013-02-21: qty 8

## 2013-02-21 MED ORDER — DIPHENHYDRAMINE HCL 50 MG/ML IJ SOLN
INTRAMUSCULAR | Status: AC
  Filled 2013-02-21: qty 1

## 2013-02-21 MED ORDER — SODIUM CHLORIDE 0.9 % IV SOLN: 16.0000 mg | Freq: Once | INTRAVENOUS | Status: DC

## 2013-02-21 MED ORDER — SODIUM CHLORIDE 0.9 % IV SOLN
Freq: Once | INTRAVENOUS | Status: AC
  Administered 2013-02-21: 10:00:00 via INTRAVENOUS

## 2013-02-21 MED ORDER — SODIUM CHLORIDE 0.9 % IV SOLN
350.0000 mg | Freq: Once | INTRAVENOUS | Status: AC
  Administered 2013-02-21: 350 mg via INTRAVENOUS
  Filled 2013-02-21: qty 35

## 2013-02-21 MED ORDER — SODIUM CHLORIDE 0.9 % IJ SOLN: 10.0000 mL | INTRAMUSCULAR | Status: DC | PRN

## 2013-02-21 MED ORDER — DIPHENHYDRAMINE HCL 50 MG/ML IJ SOLN
50.0000 mg | Freq: Once | INTRAMUSCULAR | Status: AC
  Administered 2013-02-21: 50 mg via INTRAVENOUS

## 2013-02-21 MED ORDER — DARBEPOETIN ALFA-POLYSORBATE 500 MCG/ML IJ SOLN
INTRAMUSCULAR | Status: AC
  Filled 2013-02-21: qty 1

## 2013-02-21 MED ORDER — DARBEPOETIN ALFA-POLYSORBATE 500 MCG/ML IJ SOLN
500.0000 ug | INTRAMUSCULAR | Status: DC
  Administered 2013-02-21: 500 ug via SUBCUTANEOUS

## 2013-02-21 MED ORDER — FAMOTIDINE IN NACL 20-0.9 MG/50ML-% IV SOLN
20.0000 mg | Freq: Once | INTRAVENOUS | Status: AC
  Administered 2013-02-21: 20 mg via INTRAVENOUS
  Filled 2013-02-21: qty 50

## 2013-02-21 MED ORDER — HEPARIN SOD (PORK) LOCK FLUSH 100 UNIT/ML IV SOLN
500.0000 [IU] | Freq: Once | INTRAVENOUS | Status: AC | PRN
  Administered 2013-02-21: 500 [IU]
  Filled 2013-02-21: qty 5

## 2013-02-21 NOTE — Progress Notes (Signed)
Labs drawn today for cbc/diff,cmp 

## 2013-02-21 NOTE — Patient Instructions (Addendum)
[facility] Discharge Instructions  RECOMMENDATIONS MADE BY THE CONSULTANT AND ANY TEST RESULTS WILL BE SENT TO YOUR REFERRING PHYSICIAN.  Continue with chemotherapy as scheduled. MD appointment in 3 weeks with chemotherapy appointment. Report any issues/concerns to clinic as needed prior to appointment.  Thank you for choosing [facility] to provide your oncology and hematology care.  To afford each patient quality time with our providers, please arrive at least 15 minutes before your scheduled appointment time.  With your help, our goal is to use those 15 minutes to complete the necessary work-up to ensure our physicians have the information they need to help with your evaluation and healthcare recommendations.    Effective January 1st, 2014, we ask that you re-schedule your appointment with our physicians should you arrive 10 or more minutes late for your appointment.  We strive to give you quality time with our providers, and arriving late affects you and other patients whose appointments are after yours.    Again, thank you for choosing [facility].  Our hope is that these requests will decrease the amount of time that you wait before being seen by our physicians.       _____________________________________________________________  Should you have questions after your visit to [facility], please contact our office at [phone] between the hours of 8:30 a.m. and 5:00 p.m.  Voicemails left after 4:30 p.m. will not be returned until the following business day.  For prescription refill requests, have your pharmacy contact our office with your prescription refill request.

## 2013-02-21 NOTE — Progress Notes (Signed)
Tolerated chemo well.  aranesp 500 mcg subcutaneous in lt abdomen . Discharged via w/c.

## 2013-02-22 LAB — URINE CULTURE
Colony Count: NO GROWTH
Culture: NO GROWTH

## 2013-02-23 ENCOUNTER — Other Ambulatory Visit (HOSPITAL_COMMUNITY): Payer: Self-pay | Admitting: Oncology

## 2013-02-23 ENCOUNTER — Telehealth (HOSPITAL_COMMUNITY): Payer: Self-pay | Admitting: Oncology

## 2013-02-23 ENCOUNTER — Encounter (HOSPITAL_BASED_OUTPATIENT_CLINIC_OR_DEPARTMENT_OTHER): Payer: PRIVATE HEALTH INSURANCE | Admitting: Oncology

## 2013-02-23 DIAGNOSIS — C801 Malignant (primary) neoplasm, unspecified: Secondary | ICD-10-CM

## 2013-02-23 DIAGNOSIS — R110 Nausea: Secondary | ICD-10-CM

## 2013-02-23 MED ORDER — OXYCODONE HCL 10 MG PO TABS: 10.0000 mg | ORAL_TABLET | ORAL | Status: DC | PRN

## 2013-02-23 MED ORDER — ONDANSETRON HCL 8 MG PO TABS: 8.0000 mg | ORAL_TABLET | Freq: Three times a day (TID) | ORAL | Status: DC | PRN

## 2013-02-23 NOTE — Progress Notes (Signed)
Patient is seen as a work-in today.  She is here to pick-up a pain medication Rx.  She reports increased nausea, without vomiting.  She reports that this is unusual for her following chemotherapy.  She reports that she is taking Reglan with little to no relief.  She reports that her Ativan makes her "loopy."  I do not see where she has Compazine or Zofran on her medication list.  Therefore, I have asked her to try Zofran.  An Rx for Zofran 8 mg PO every 8 hours PRN nausea/vomiting #45 with 1 refill printed for her.  She will update [place] on Monday.    Jaeven Wanzer

## 2013-02-26 ENCOUNTER — Encounter (HOSPITAL_COMMUNITY): Payer: PRIVATE HEALTH INSURANCE | Admitting: Oncology

## 2013-02-27 ENCOUNTER — Other Ambulatory Visit (HOSPITAL_COMMUNITY): Payer: Self-pay | Admitting: Oncology

## 2013-02-27 ENCOUNTER — Encounter (HOSPITAL_BASED_OUTPATIENT_CLINIC_OR_DEPARTMENT_OTHER): Payer: PRIVATE HEALTH INSURANCE

## 2013-02-27 VITALS — BP 98/55 | HR 87 | Temp 98.6°F | Resp 16

## 2013-02-27 DIAGNOSIS — C50919 Malignant neoplasm of unspecified site of unspecified female breast: Secondary | ICD-10-CM

## 2013-02-27 DIAGNOSIS — R110 Nausea: Secondary | ICD-10-CM

## 2013-02-27 DIAGNOSIS — C801 Malignant (primary) neoplasm, unspecified: Secondary | ICD-10-CM

## 2013-02-27 DIAGNOSIS — C569 Malignant neoplasm of unspecified ovary: Secondary | ICD-10-CM

## 2013-02-27 DIAGNOSIS — E871 Hypo-osmolality and hyponatremia: Secondary | ICD-10-CM

## 2013-02-27 LAB — COMPREHENSIVE METABOLIC PANEL
ALT: 23 U/L (ref 0–35)
AST: 30 U/L (ref 0–37)
Albumin: 3.5 g/dL (ref 3.5–5.2)
BUN: 14 mg/dL (ref 6–23)
Calcium: 9.8 mg/dL (ref 8.4–10.5)
GFR calc Af Amer: 59 mL/min — ABNORMAL LOW (ref >90)
Glucose, Bld: 133 mg/dL — ABNORMAL HIGH (ref 70–99)
Sodium: 132 mEq/L — ABNORMAL LOW (ref 135–145)
Total Protein: 6.8 g/dL (ref 6.0–8.3)

## 2013-02-27 LAB — CBC WITH DIFFERENTIAL/PLATELET
Basophils Relative: 1 % (ref 0–1)
Eosinophils Absolute: 0.1 10*3/uL (ref 0.0–0.7)
Eosinophils Relative: 3 % (ref 0–5)
HCT: 24.5 % — ABNORMAL LOW (ref 36.0–46.0)
Hemoglobin: 8.4 g/dL — ABNORMAL LOW (ref 12.0–15.0)
Lymphs Abs: 0.6 10*3/uL — ABNORMAL LOW (ref 0.7–4.0)
MCH: 32.1 pg (ref 26.0–34.0)
MCHC: 34.3 g/dL (ref 30.0–36.0)
MCV: 93.5 fL (ref 78.0–100.0)
Monocytes Relative: 2 % — ABNORMAL LOW (ref 3–12)
Platelets: 139 10*3/uL — ABNORMAL LOW (ref 150–400)
RBC: 2.62 MIL/uL — ABNORMAL LOW (ref 3.87–5.11)

## 2013-02-27 MED ORDER — SODIUM CHLORIDE 0.9 % IV SOLN: INTRAVENOUS | Status: DC

## 2013-02-27 MED ORDER — LORAZEPAM 2 MG/ML IJ SOLN
0.5000 mg | Freq: Once | INTRAMUSCULAR | Status: AC
  Administered 2013-02-27: 0.5 mg via INTRAVENOUS
  Filled 2013-02-27: qty 1

## 2013-02-27 MED ORDER — SODIUM CHLORIDE 0.9 % IJ SOLN
10.0000 mL | INTRAMUSCULAR | Status: DC | PRN
  Administered 2013-02-27: 10 mL via INTRAVENOUS

## 2013-02-27 MED ORDER — DEXAMETHASONE SODIUM PHOSPHATE 10 MG/ML IJ SOLN: 10.0000 mg | Freq: Once | INTRAMUSCULAR | Status: DC

## 2013-02-27 MED ORDER — SODIUM CHLORIDE 0.9 % IV SOLN
INTRAVENOUS | Status: DC
  Administered 2013-02-27: 13:00:00 via INTRAVENOUS

## 2013-02-27 MED ORDER — SODIUM CHLORIDE 0.9 % IV SOLN
Freq: Once | INTRAVENOUS | Status: AC
  Administered 2013-02-27: 8 mg via INTRAVENOUS
  Filled 2013-02-27: qty 4

## 2013-02-27 MED ORDER — HEPARIN SOD (PORK) LOCK FLUSH 100 UNIT/ML IV SOLN
500.0000 [IU] | Freq: Once | INTRAVENOUS | Status: AC
  Administered 2013-02-27: 500 [IU] via INTRAVENOUS
  Filled 2013-02-27: qty 5

## 2013-02-27 MED ORDER — SODIUM CHLORIDE 0.9 % IV SOLN: 8.0000 mg | Freq: Once | INTRAVENOUS | Status: DC

## 2013-02-27 NOTE — Progress Notes (Signed)
Labs drawn today for cbc/diff,cmp 

## 2013-03-07 ENCOUNTER — Ambulatory Visit (HOSPITAL_COMMUNITY): Payer: PRIVATE HEALTH INSURANCE

## 2013-03-09 ENCOUNTER — Encounter (HOSPITAL_COMMUNITY): Payer: PRIVATE HEALTH INSURANCE | Attending: Internal Medicine

## 2013-03-09 DIAGNOSIS — T451X5A Adverse effect of antineoplastic and immunosuppressive drugs, initial encounter: Secondary | ICD-10-CM | POA: Insufficient documentation

## 2013-03-09 DIAGNOSIS — D6481 Anemia due to antineoplastic chemotherapy: Secondary | ICD-10-CM | POA: Insufficient documentation

## 2013-03-09 DIAGNOSIS — T50904A Poisoning by unspecified drugs, medicaments and biological substances, undetermined, initial encounter: Secondary | ICD-10-CM | POA: Insufficient documentation

## 2013-03-09 DIAGNOSIS — C801 Malignant (primary) neoplasm, unspecified: Secondary | ICD-10-CM | POA: Insufficient documentation

## 2013-03-09 DIAGNOSIS — D702 Other drug-induced agranulocytosis: Secondary | ICD-10-CM | POA: Insufficient documentation

## 2013-03-09 DIAGNOSIS — R609 Edema, unspecified: Secondary | ICD-10-CM | POA: Insufficient documentation

## 2013-03-09 DIAGNOSIS — I776 Arteritis, unspecified: Secondary | ICD-10-CM | POA: Insufficient documentation

## 2013-03-09 DIAGNOSIS — L539 Erythematous condition, unspecified: Secondary | ICD-10-CM | POA: Insufficient documentation

## 2013-03-09 DIAGNOSIS — L0291 Cutaneous abscess, unspecified: Secondary | ICD-10-CM | POA: Insufficient documentation

## 2013-03-09 LAB — CBC WITH DIFFERENTIAL/PLATELET
Basophils Absolute: 0 10*3/uL (ref 0.0–0.1)
Basophils Relative: 1 % (ref 0–1)
Eosinophils Relative: 1 % (ref 0–5)
HCT: 24.6 % — ABNORMAL LOW (ref 36.0–46.0)
Hemoglobin: 8 g/dL — ABNORMAL LOW (ref 12.0–15.0)
Lymphocytes Relative: 42 % (ref 12–46)
MCHC: 32.5 g/dL (ref 30.0–36.0)
MCV: 98.4 fL (ref 78.0–100.0)
Monocytes Absolute: 0.4 10*3/uL (ref 0.1–1.0)
Monocytes Relative: 18 % — ABNORMAL HIGH (ref 3–12)
Neutro Abs: 0.7 10*3/uL — ABNORMAL LOW (ref 1.7–7.7)
Neutrophils Relative %: 37 % — ABNORMAL LOW (ref 43–77)
Platelets: 119 10*3/uL — ABNORMAL LOW (ref 150–400)
RBC: 2.5 MIL/uL — ABNORMAL LOW (ref 3.87–5.11)
RDW: 19.9 % — ABNORMAL HIGH (ref 11.5–15.5)
WBC: 2 10*3/uL — ABNORMAL LOW (ref 4.0–10.5)

## 2013-03-09 NOTE — Addendum Note (Signed)
Addended by: Dennie Maizes on: 03/09/2013 11:53 AM   Modules accepted: Orders, SmartSet

## 2013-03-09 NOTE — Progress Notes (Signed)
Labs drawn today for cbc/diff 

## 2013-03-12 ENCOUNTER — Encounter (HOSPITAL_BASED_OUTPATIENT_CLINIC_OR_DEPARTMENT_OTHER): Payer: PRIVATE HEALTH INSURANCE

## 2013-03-12 VITALS — BP 138/81 | HR 88 | Temp 98.7°F | Resp 16

## 2013-03-12 DIAGNOSIS — C801 Malignant (primary) neoplasm, unspecified: Secondary | ICD-10-CM

## 2013-03-12 DIAGNOSIS — D6481 Anemia due to antineoplastic chemotherapy: Secondary | ICD-10-CM

## 2013-03-12 MED ORDER — SODIUM CHLORIDE 0.9 % IJ SOLN
10.0000 mL | INTRAMUSCULAR | Status: AC | PRN
  Administered 2013-03-12: 10 mL

## 2013-03-12 MED ORDER — DIPHENHYDRAMINE HCL 25 MG PO CAPS
ORAL_CAPSULE | ORAL | Status: AC
  Filled 2013-03-12: qty 1

## 2013-03-12 MED ORDER — ACETAMINOPHEN 325 MG PO TABS
ORAL_TABLET | ORAL | Status: AC
  Filled 2013-03-12: qty 2

## 2013-03-12 MED ORDER — SODIUM CHLORIDE 0.9 % IV SOLN
250.0000 mL | Freq: Once | INTRAVENOUS | Status: AC
  Administered 2013-03-12: 250 mL via INTRAVENOUS

## 2013-03-12 MED ORDER — HEPARIN SOD (PORK) LOCK FLUSH 100 UNIT/ML IV SOLN
250.0000 [IU] | INTRAVENOUS | Status: AC | PRN
Start: 2013-03-12 — End: 2013-03-12
  Administered 2013-03-12: 500 [IU]
  Filled 2013-03-12: qty 5

## 2013-03-12 MED ORDER — DIPHENHYDRAMINE HCL 25 MG PO CAPS
25.0000 mg | ORAL_CAPSULE | Freq: Once | ORAL | Status: AC
  Administered 2013-03-12: 25 mg via ORAL

## 2013-03-12 MED ORDER — ACETAMINOPHEN 325 MG PO TABS
650.0000 mg | ORAL_TABLET | Freq: Once | ORAL | Status: AC
  Administered 2013-03-12: 650 mg via ORAL

## 2013-03-12 NOTE — Progress Notes (Signed)
Tolerated 2 units prbc's well. Stated she felt better after receiving blood product. Port left accessed for use tomorrow Site is clean,dry and intact.

## 2013-03-13 ENCOUNTER — Telehealth (HOSPITAL_COMMUNITY): Payer: Self-pay | Admitting: Hematology and Oncology

## 2013-03-13 ENCOUNTER — Encounter (HOSPITAL_COMMUNITY): Payer: PRIVATE HEALTH INSURANCE

## 2013-03-13 ENCOUNTER — Encounter (HOSPITAL_COMMUNITY): Payer: Self-pay

## 2013-03-13 ENCOUNTER — Encounter (HOSPITAL_BASED_OUTPATIENT_CLINIC_OR_DEPARTMENT_OTHER): Payer: PRIVATE HEALTH INSURANCE

## 2013-03-13 DIAGNOSIS — D702 Other drug-induced agranulocytosis: Secondary | ICD-10-CM

## 2013-03-13 DIAGNOSIS — C801 Malignant (primary) neoplasm, unspecified: Secondary | ICD-10-CM

## 2013-03-13 DIAGNOSIS — D6481 Anemia due to antineoplastic chemotherapy: Secondary | ICD-10-CM

## 2013-03-13 LAB — CBC WITH DIFFERENTIAL/PLATELET
Basophils Absolute: 0 10*3/uL (ref 0.0–0.1)
Basophils Relative: 1 % (ref 0–1)
Eosinophils Absolute: 0 10*3/uL (ref 0.0–0.7)
Eosinophils Relative: 1 % (ref 0–5)
Lymphocytes Relative: 34 % (ref 12–46)
MCH: 30.9 pg (ref 26.0–34.0)
MCV: 94.7 fL (ref 78.0–100.0)
Neutrophils Relative %: 52 % (ref 43–77)
Platelets: 89 10*3/uL — ABNORMAL LOW (ref 150–400)
RBC: 3.37 MIL/uL — ABNORMAL LOW (ref 3.87–5.11)
RDW: 19.8 % — ABNORMAL HIGH (ref 11.5–15.5)
WBC: 3.3 10*3/uL — ABNORMAL LOW (ref 4.0–10.5)

## 2013-03-13 LAB — COMPREHENSIVE METABOLIC PANEL
ALT: 12 U/L (ref 0–35)
AST: 20 U/L (ref 0–37)
Alkaline Phosphatase: 78 U/L (ref 39–117)
Calcium: 9.7 mg/dL (ref 8.4–10.5)
Potassium: 4.1 mEq/L (ref 3.5–5.1)
Sodium: 137 mEq/L (ref 135–145)
Total Protein: 7 g/dL (ref 6.0–8.3)

## 2013-03-13 LAB — TYPE AND SCREEN
ABO/RH(D): A POS
Antibody Screen: NEGATIVE
Unit division: 0

## 2013-03-13 MED ORDER — HEPARIN SOD (PORK) LOCK FLUSH 100 UNIT/ML IV SOLN
INTRAVENOUS | Status: AC
  Filled 2013-03-13: qty 5

## 2013-03-13 NOTE — Progress Notes (Signed)
[facility]  OFFICE PROGRESS NOTE  [name], MD [address] [place] [address]  DIAGNOSIS: Cancer of unknown origin - Plan: NM PET Image Initial (PI) Skull Base To Thigh  Anemia due to chemotherapy - Plan: CBC with Differential  Neutropenia, drug-induced - Plan: CBC with Differential  Chief Complaint  Patient presents with  . Chemotherapy    Unknown primary adenocarcinoma    CURRENT THERAPY: Carboplatin/Taxol status post 4 cycles last being on 02/21/2013  INTERVAL HISTORY: Erika Moreno 67 y.o. female returns for followup of unknown primary adenocarcinoma, status post 4 cycles of carboplatin/Taxol after demonstration of a complete clinical response after the first 2 cycles. She had required a 15% dose reduction due to 2 persistent leukopenia and had received 2 units of packed red blood cells on 03/12/2013 when her neutrophil count was only 0.7. She returns today in anticipation of receiving additional chemotherapy if neutrophil count has improved.  She continues with lower 70 paresthesias and now developing upper 70 paresthesias as well. She still able to button buttons, write, and eat. She denies any fever, night sweats, diarrhea, constipation, and has had improved nausea using Zofran. Weakness is a common dominate her along with fatigue. Appetite is good with no dysuria, incontinence, lower extremity swelling or redness, abdominal distention, cough, wheezing, skin rash, headache, or seizures.  MEDICAL HISTORY: Past Medical History  Diagnosis Date  . 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 Cervical cancer 1984  . Myocardial infarction 20 yrs ago  . Coronary artery disease   . Anxiety   . Chronic back pain   . Depression   . GERD (gastroesophageal reflux disease)   . Hypertension   . Hypothyroidism   . Anemia   . Vitamin B 12 deficiency   . Vitamin D deficiency disease     INTERIM HISTORY:  has Cancer of unknown origin; Anemia due to chemotherapy; Acute sinusitis; Normocytic anemia; Antineoplastic chemotherapy induced pancytopenia; UTI (lower urinary tract infection); Dyspnea on exertion; Neuropathy due to chemotherapeutic drug; and Thrombophlebitis leg superficial on her problem list.   67 y.o. female returns for regular visit for followup of unknown primary adenocarcinoma presenting with abdominal disease, complete response by CT scan to first 2 full cycles of carboplatin and Taxol preceded by 2 weekly treatments with carboplatin and Taxol.  Unfortunately, Erika Moreno was admitted to the [facility] on 02/10/2013 and discharged on 02/12/2013. She was diagnosed with cellulitis and treated with antibiotics with rapid improvement. She received cycle #4 of carboplatin and Taxol on 02/21/2013. She develop nausea and was placed on Zofran since metoclopramide was not working well for delayed nausea.  ALLERGIES:  is allergic to morphine and related and sulfa antibiotics.  MEDICATIONS: has a current medication list which includes the following prescription(s): aspirin ec, atorvastatin, clopidogrel, cyclobenzaprine, dexamethasone, dexlansoprazole, duloxetine, furosemide, isosorbide mononitrate, lidocaine-prilocaine, metoclopramide, ondansetron, oxycodone hcl, oxymetazoline, potassium chloride sa, sodium chloride, and nitroglycerin.  SURGICAL HISTORY:  Past Surgical History  Procedure Laterality Date  . Heart bypass      triple bypass with stents  . Coronary artery bypass graft      3 vessels  . Cardiac catheterization      stents x3  . Abdominal hysterectomy    . Portacath placement Right 12/06/2012    Procedure: INSERTION PORT-A-CATH;  Surgeon: Fabio Bering, MD;  Location: [facility];  Service: General;  Laterality: Right;  FAMILY HISTORY: family history includes Heart attack in her father and mother.  SOCIAL HISTORY:  reports that she quit smoking about 24 years ago. Her smoking use  included Cigarettes. She has a 44 pack-year smoking history. She has never used smokeless tobacco. She reports that she does not drink alcohol or use illicit drugs.  REVIEW OF SYSTEMS:  Other than that discussed above is noncontributory.  PHYSICAL EXAMINATION: ECOG PERFORMANCE STATUS: 1 - Symptomatic but completely ambulatory  There were no vitals taken for this visit.  GENERAL:alert, no distress and comfortable SKIN: skin color, texture, turgor are normal, no rashes or significant lesions EYES: PERLA; Conjunctiva are pink and non-injected, sclera clear OROPHARYNX:no exudate, no erythema on lips, buccal mucosa, or tongue. NECK: supple, thyroid normal size, non-tender, without nodularity. No masses CHEST: Normal AP diameter with no breast masses. LifePort in place. LYMPH:  no palpable lymphadenopathy in the cervical, axillary or inguinal. Previously noted left supraclavicular lymph node where tissue biopsy was obtained has not regrown. LUNGS: clear to auscultation and percussion with normal breathing effort HEART: regular rate & rhythm and no murmurs. ABDOMEN:abdomen soft, non-tender and normal bowel sounds MUSCULOSKELETAL:no cyanosis of digits and no clubbing. Range of motion normal.  NEURO: alert & oriented x 3 with fluent speech, no focal motor/sensory deficits   LABORATORY DATA: Infusion on 03/13/2013  Component Date Value Range Status  . WBC 03/13/2013 3.3* 4.0 - 10.5 K/uL Final  . RBC 03/13/2013 3.37* 3.87 - 5.11 MIL/uL Final  . Hemoglobin 03/13/2013 10.4* 12.0 - 15.0 g/dL Final  . HCT 40/98/1191 31.9* 36.0 - 46.0 % Final  . MCV 03/13/2013 94.7  78.0 - 100.0 fL Final  . MCH 03/13/2013 30.9  26.0 - 34.0 pg Final  . MCHC 03/13/2013 32.6  30.0 - 36.0 g/dL Final  . RDW 47/82/9562 19.8* 11.5 - 15.5 % Final  . Platelets 03/13/2013 89* 150 - 400 K/uL Final   Comment: SPECIMEN CHECKED FOR CLOTS                          PLATELETS APPEAR DECREASED                          PLATELET  COUNT CONFIRMED BY SMEAR  . Neutrophils Relative % 03/13/2013 52  43 - 77 % Final  . Neutro Abs 03/13/2013 1.7  1.7 - 7.7 K/uL Final  . Lymphocytes Relative 03/13/2013 34  12 - 46 % Final  . Lymphs Abs 03/13/2013 1.1  0.7 - 4.0 K/uL Final  . Monocytes Relative 03/13/2013 12  3 - 12 % Final  . Monocytes Absolute 03/13/2013 0.4  0.1 - 1.0 K/uL Final  . Eosinophils Relative 03/13/2013 1  0 - 5 % Final  . Eosinophils Absolute 03/13/2013 0.0  0.0 - 0.7 K/uL Final  . Basophils Relative 03/13/2013 1  0 - 1 % Final  . Basophils Absolute 03/13/2013 0.0  0.0 - 0.1 K/uL Final  . Sodium 03/13/2013 137  135 - 145 mEq/L Final  . Potassium 03/13/2013 4.1  3.5 - 5.1 mEq/L Final  . Chloride 03/13/2013 102  96 - 112 mEq/L Final  . CO2 03/13/2013 27  19 - 32 mEq/L Final  . Glucose, Bld 03/13/2013 92  70 - 99 mg/dL Final  . BUN 13/11/6576 5* 6 - 23 mg/dL Final  . Creatinine, Ser 03/13/2013 0.64  0.50 - 1.10 mg/dL Final  . Calcium 46/96/2952 9.7  8.4 -  10.5 mg/dL Final  . Total Protein 03/13/2013 7.0  6.0 - 8.3 g/dL Final  . Albumin 16/01/9603 3.4* 3.5 - 5.2 g/dL Final  . AST 54/12/8117 20  0 - 37 U/L Final  . ALT 03/13/2013 12  0 - 35 U/L Final  . Alkaline Phosphatase 03/13/2013 78  39 - 117 U/L Final  . Total Bilirubin 03/13/2013 0.5  0.3 - 1.2 mg/dL Final  . GFR calc non Af Amer 03/13/2013 >90  >90 mL/min Final  . GFR calc Af Amer 03/13/2013 >90  >90 mL/min Final   Comment: (NOTE)                          The eGFR has been calculated using the CKD EPI equation.                          This calculation has not been validated in all clinical situations.                          eGFR's persistently <90 mL/min signify possible Chronic Kidney                          Disease.  Infusion on 03/12/2013  Component Date Value Range Status  . Order Confirmation 03/12/2013 ORDER PROCESSED BY BLOOD BANK   Final  Infusion on 03/09/2013  Component Date Value Range Status  . WBC 03/09/2013 2.0* 4.0 - 10.5 K/uL  Final  . RBC 03/09/2013 2.50* 3.87 - 5.11 MIL/uL Final  . Hemoglobin 03/09/2013 8.0* 12.0 - 15.0 g/dL Final  . HCT 14/78/2956 24.6* 36.0 - 46.0 % Final  . MCV 03/09/2013 98.4  78.0 - 100.0 fL Final  . MCH 03/09/2013 32.0  26.0 - 34.0 pg Final  . MCHC 03/09/2013 32.5  30.0 - 36.0 g/dL Final  . RDW 21/30/8657 19.9* 11.5 - 15.5 % Final  . Platelets 03/09/2013 119* 150 - 400 K/uL Final   SPECIMEN CHECKED FOR CLOTS  . Neutrophils Relative % 03/09/2013 37* 43 - 77 % Final  . Neutro Abs 03/09/2013 0.7* 1.7 - 7.7 K/uL Final  . Lymphocytes Relative 03/09/2013 42  12 - 46 % Final  . Lymphs Abs 03/09/2013 0.8  0.7 - 4.0 K/uL Final  . Monocytes Relative 03/09/2013 18* 3 - 12 % Final  . Monocytes Absolute 03/09/2013 0.4  0.1 - 1.0 K/uL Final  . Eosinophils Relative 03/09/2013 1  0 - 5 % Final  . Eosinophils Absolute 03/09/2013 0.0  0.0 - 0.7 K/uL Final  . Basophils Relative 03/09/2013 1  0 - 1 % Final  . Basophils Absolute 03/09/2013 0.0  0.0 - 0.1 K/uL Final  . ABO/RH(D) 03/09/2013 A POS   Final  . Antibody Screen 03/09/2013 NEG   Final  . Sample Expiration 03/09/2013 03/12/2013   Final  . Unit Number 03/09/2013 [id number]   Final  . Blood Component Type 03/09/2013 RED CELLS,LR   Final  . Unit division 03/09/2013 00   Final  . Status of Unit 03/09/2013 ISSUED,FINAL   Final  . Transfusion Status 03/09/2013 OK TO TRANSFUSE   Final  . Crossmatch Result 03/09/2013 Compatible   Final  . Unit Number 03/09/2013 [id number]   Final  . Blood Component Type 03/09/2013 RED CELLS,LR   Final  . Unit division 03/09/2013 00   Final  . Status of Unit  03/09/2013 ISSUED,FINAL   Final  . Transfusion Status 03/09/2013 OK TO TRANSFUSE   Final  . Crossmatch Result 03/09/2013 Compatible   Final  Infusion on 02/27/2013  Component Date Value Range Status  . WBC 02/27/2013 1.8* 4.0 - 10.5 K/uL Final  . RBC 02/27/2013 2.62* 3.87 - 5.11 MIL/uL Final  . Hemoglobin 02/27/2013 8.4* 12.0 - 15.0 g/dL Final  . HCT  69/62/9528 24.5* 36.0 - 46.0 % Final  . MCV 02/27/2013 93.5  78.0 - 100.0 fL Final  . MCH 02/27/2013 32.1  26.0 - 34.0 pg Final  . MCHC 02/27/2013 34.3  30.0 - 36.0 g/dL Final  . RDW 41/32/4401 17.8* 11.5 - 15.5 % Final  . Platelets 02/27/2013 139* 150 - 400 K/uL Final  . Neutrophils Relative % 02/27/2013 61  43 - 77 % Final  . Neutro Abs 02/27/2013 1.1* 1.7 - 7.7 K/uL Final  . Lymphocytes Relative 02/27/2013 33  12 - 46 % Final  . Lymphs Abs 02/27/2013 0.6* 0.7 - 4.0 K/uL Final  . Monocytes Relative 02/27/2013 2* 3 - 12 % Final  . Monocytes Absolute 02/27/2013 0.0* 0.1 - 1.0 K/uL Final  . Eosinophils Relative 02/27/2013 3  0 - 5 % Final  . Eosinophils Absolute 02/27/2013 0.1  0.0 - 0.7 K/uL Final  . Basophils Relative 02/27/2013 1  0 - 1 % Final  . Basophils Absolute 02/27/2013 0.0  0.0 - 0.1 K/uL Final  . Sodium 02/27/2013 132* 135 - 145 mEq/L Final  . Potassium 02/27/2013 3.6  3.5 - 5.1 mEq/L Final  . Chloride 02/27/2013 94* 96 - 112 mEq/L Final  . CO2 02/27/2013 28  19 - 32 mEq/L Final  . Glucose, Bld 02/27/2013 133* 70 - 99 mg/dL Final  . BUN 02/72/5366 14  6 - 23 mg/dL Final  . Creatinine, Ser 02/27/2013 1.10  0.50 - 1.10 mg/dL Final  . Calcium 44/06/4740 9.8  8.4 - 10.5 mg/dL Final  . Total Protein 02/27/2013 6.8  6.0 - 8.3 g/dL Final  . Albumin 59/56/3875 3.5  3.5 - 5.2 g/dL Final  . AST 64/33/2951 30  0 - 37 U/L Final  . ALT 02/27/2013 23  0 - 35 U/L Final  . Alkaline Phosphatase 02/27/2013 82  39 - 117 U/L Final  . Total Bilirubin 02/27/2013 0.5  0.3 - 1.2 mg/dL Final  . GFR calc non Af Amer 02/27/2013 51* >90 mL/min Final  . GFR calc Af Amer 02/27/2013 59* >90 mL/min Final   Comment: (NOTE)                          The eGFR has been calculated using the CKD EPI equation.                          This calculation has not been validated in all clinical situations.                          eGFR's persistently <90 mL/min signify possible Chronic Kidney                           Disease.  Infusion on 02/21/2013  Component Date Value Range Status  . Specimen Description 02/21/2013 URINE, CLEAN CATCH   Final  . Special Requests 02/21/2013 NONE   Final  . Culture  Setup Time 02/21/2013    Final  Value:02/21/2013 17:30                         Performed at [facility]  . Colony Count 02/21/2013    Final                   Value:NO GROWTH                         Performed at [facility]  . Culture 02/21/2013    Final                   Value:NO GROWTH                         Performed at [facility]  . Report Status 02/21/2013 02/22/2013 FINAL   Final  Infusion on 02/21/2013  Component Date Value Range Status  . WBC 02/21/2013 3.4* 4.0 - 10.5 K/uL Final  . RBC 02/21/2013 2.92* 3.87 - 5.11 MIL/uL Final  . Hemoglobin 02/21/2013 9.1* 12.0 - 15.0 g/dL Final  . HCT 08/65/7846 27.7* 36.0 - 46.0 % Final  . MCV 02/21/2013 94.9  78.0 - 100.0 fL Final  . MCH 02/21/2013 31.2  26.0 - 34.0 pg Final  . MCHC 02/21/2013 32.9  30.0 - 36.0 g/dL Final  . RDW 96/29/5284 19.2* 11.5 - 15.5 % Final  . Platelets 02/21/2013 146* 150 - 400 K/uL Final  . Neutrophils Relative % 02/21/2013 50  43 - 77 % Final  . Neutro Abs 02/21/2013 1.7  1.7 - 7.7 K/uL Final  . Lymphocytes Relative 02/21/2013 35  12 - 46 % Final  . Lymphs Abs 02/21/2013 1.2  0.7 - 4.0 K/uL Final  . Monocytes Relative 02/21/2013 13* 3 - 12 % Final  . Monocytes Absolute 02/21/2013 0.4  0.1 - 1.0 K/uL Final  . Eosinophils Relative 02/21/2013 2  0 - 5 % Final  . Eosinophils Absolute 02/21/2013 0.1  0.0 - 0.7 K/uL Final  . Basophils Relative 02/21/2013 0  0 - 1 % Final  . Basophils Absolute 02/21/2013 0.0  0.0 - 0.1 K/uL Final  . Sodium 02/21/2013 134* 135 - 145 mEq/L Final  . Potassium 02/21/2013 3.6  3.5 - 5.1 mEq/L Final  . Chloride 02/21/2013 97  96 - 112 mEq/L Final  . CO2 02/21/2013 28  19 - 32 mEq/L Final  . Glucose, Bld 02/21/2013 131* 70 - 99 mg/dL Final  . BUN  13/24/4010 21  6 - 23 mg/dL Final  . Creatinine, Ser 02/21/2013 0.77  0.50 - 1.10 mg/dL Final  . Calcium 27/25/3664 9.7  8.4 - 10.5 mg/dL Final  . Total Protein 02/21/2013 6.9  6.0 - 8.3 g/dL Final  . Albumin 40/34/7425 3.5  3.5 - 5.2 g/dL Final  . AST 95/63/8756 23  0 - 37 U/L Final  . ALT 02/21/2013 11  0 - 35 U/L Final  . Alkaline Phosphatase 02/21/2013 87  39 - 117 U/L Final  . Total Bilirubin 02/21/2013 0.5  0.3 - 1.2 mg/dL Final  . GFR calc non Af Amer 02/21/2013 85* >90 mL/min Final  . GFR calc Af Amer 02/21/2013 >90  >90 mL/min Final   Comment: (NOTE)                          The eGFR has been calculated using the CKD EPI equation.  This calculation has not been validated in all clinical situations.                          eGFR's persistently <90 mL/min signify possible Chronic Kidney                          Disease.  Infusion on 02/16/2013  Component Date Value Range Status  . ABO/RH(D) 02/16/2013 A POS   Final  . Antibody Screen 02/16/2013 NEG   Final  . Sample Expiration 02/16/2013 02/19/2013   Final  . Unit Number 02/16/2013 [id number]   Final  . Blood Component Type 02/16/2013 RED CELLS,LR   Final  . Unit division 02/16/2013 00   Final  . Status of Unit 02/16/2013 ISSUED,FINAL   Final  . Transfusion Status 02/16/2013 OK TO TRANSFUSE   Final  . Crossmatch Result 02/16/2013 Compatible   Final  . Unit Number 02/16/2013 [id number]   Final  . Blood Component Type 02/16/2013 RED CELLS,LR   Final  . Unit division 02/16/2013 00   Final  . Status of Unit 02/16/2013 ISSUED,FINAL   Final  . Transfusion Status 02/16/2013 OK TO TRANSFUSE   Final  . Crossmatch Result 02/16/2013 Compatible   Final  . Order Confirmation 02/16/2013 ORDER PROCESSED BY BLOOD BANK   Final  Office Visit on 02/14/2013  Component Date Value Range Status  . WBC 02/14/2013 3.2* 4.0 - 10.5 K/uL Final  . RBC 02/14/2013 2.42* 3.87 - 5.11 MIL/uL Final  . Hemoglobin 02/14/2013  7.7* 12.0 - 15.0 g/dL Final  . HCT 30/86/5784 23.4* 36.0 - 46.0 % Final  . MCV 02/14/2013 96.7  78.0 - 100.0 fL Final  . MCH 02/14/2013 31.8  26.0 - 34.0 pg Final  . MCHC 02/14/2013 32.9  30.0 - 36.0 g/dL Final  . RDW 69/62/9528 19.5* 11.5 - 15.5 % Final  . Platelets 02/14/2013 92* 150 - 400 K/uL Final  . Neutrophils Relative % 02/14/2013 53  43 - 77 % Final  . Neutro Abs 02/14/2013 1.7  1.7 - 7.7 K/uL Final  . Lymphocytes Relative 02/14/2013 36  12 - 46 % Final  . Lymphs Abs 02/14/2013 1.2  0.7 - 4.0 K/uL Final  . Monocytes Relative 02/14/2013 10  3 - 12 % Final  . Monocytes Absolute 02/14/2013 0.3  0.1 - 1.0 K/uL Final  . Eosinophils Relative 02/14/2013 1  0 - 5 % Final  . Eosinophils Absolute 02/14/2013 0.0  0.0 - 0.7 K/uL Final  . Basophils Relative 02/14/2013 0  0 - 1 % Final  . Basophils Absolute 02/14/2013 0.0  0.0 - 0.1 K/uL Final  . Smear Review 02/14/2013 PLATELETS APPEAR DECREASED   Final   PLATELET COUNT CONFIRMED BY SMEAR  . Sodium 02/14/2013 134* 135 - 145 mEq/L Final  . Potassium 02/14/2013 4.0  3.5 - 5.1 mEq/L Final  . Chloride 02/14/2013 99  96 - 112 mEq/L Final  . CO2 02/14/2013 25  19 - 32 mEq/L Final  . Glucose, Bld 02/14/2013 100* 70 - 99 mg/dL Final  . BUN 41/32/4401 13  6 - 23 mg/dL Final  . Creatinine, Ser 02/14/2013 0.53  0.50 - 1.10 mg/dL Final  . Calcium 02/72/5366 9.5  8.4 - 10.5 mg/dL Final  . Total Protein 02/14/2013 7.0  6.0 - 8.3 g/dL Final  . Albumin 44/06/4740 3.4* 3.5 - 5.2 g/dL Final  . AST 59/56/3875 27  0 - 37  U/L Final  . ALT 02/14/2013 20  0 - 35 U/L Final  . Alkaline Phosphatase 02/14/2013 93  39 - 117 U/L Final  . Total Bilirubin 02/14/2013 0.6  0.3 - 1.2 mg/dL Final  . GFR calc non Af Amer 02/14/2013 >90  >90 mL/min Final  . GFR calc Af Amer 02/14/2013 >90  >90 mL/min Final   Comment: (NOTE)                          The eGFR has been calculated using the CKD EPI equation.                          This calculation has not been validated  in all clinical situations.                          eGFR's persistently <90 mL/min signify possible Chronic Kidney                          Disease.  . Iron 02/14/2013 71  42 - 135 ug/dL Final  . TIBC 16/01/9603 292  250 - 470 ug/dL Final  . Saturation Ratios 02/14/2013 24  20 - 55 % Final  . UIBC 02/14/2013 221  125 - 400 ug/dL Final   Performed at [facility]  . Ferritin 02/14/2013 992* 10 - 291 ng/mL Final   Performed at [facility]  Admission on 02/10/2013, Discharged on 02/12/2013  Component Date Value Range Status  . WBC 02/10/2013 3.7* 4.0 - 10.5 K/uL Final  . RBC 02/10/2013 1.93* 3.87 - 5.11 MIL/uL Final  . Hemoglobin 02/10/2013 6.1* 12.0 - 15.0 g/dL Final   Comment: RESULT REPEATED AND VERIFIED                          CRITICAL RESULT CALLED TO, READ BACK BY AND VERIFIED WITH:                          POINDEXTER,M @ 1719 ON11/8/14 BY WOODIE,J  . HCT 02/10/2013 18.4* 36.0 - 46.0 % Final  . MCV 02/10/2013 95.3  78.0 - 100.0 fL Final  . MCH 02/10/2013 31.6  26.0 - 34.0 pg Final  . MCHC 02/10/2013 33.2  30.0 - 36.0 g/dL Final  . RDW 54/12/8117 21.3* 11.5 - 15.5 % Final  . Platelets 02/10/2013 60* 150 - 400 K/uL Final   Comment: SPECIMEN CHECKED FOR CLOTS                          PLATELET COUNT CONFIRMED BY SMEAR  . Neutrophils Relative % 02/10/2013 45  43 - 77 % Final  . Neutro Abs 02/10/2013 1.6* 1.7 - 7.7 K/uL Final  . Lymphocytes Relative 02/10/2013 39  12 - 46 % Final  . Lymphs Abs 02/10/2013 1.5  0.7 - 4.0 K/uL Final  . Monocytes Relative 02/10/2013 16* 3 - 12 % Final  . Monocytes Absolute 02/10/2013 0.6  0.1 - 1.0 K/uL Final  . Eosinophils Relative 02/10/2013 0  0 - 5 % Final  . Eosinophils Absolute 02/10/2013 0.0  0.0 - 0.7 K/uL Final  . Basophils Relative 02/10/2013 0  0 - 1 % Final  . Basophils Absolute 02/10/2013 0.0  0.0 - 0.1 K/uL Final  .  Sodium 02/10/2013 128* 135 - 145 mEq/L Final  . Potassium 02/10/2013 3.7  3.5 - 5.1 mEq/L Final  .  Chloride 02/10/2013 93* 96 - 112 mEq/L Final  . CO2 02/10/2013 25  19 - 32 mEq/L Final  . Glucose, Bld 02/10/2013 115* 70 - 99 mg/dL Final  . BUN 40/98/1191 22  6 - 23 mg/dL Final  . Creatinine, Ser 02/10/2013 0.80  0.50 - 1.10 mg/dL Final  . Calcium 47/82/9562 9.3  8.4 - 10.5 mg/dL Final  . Total Protein 02/10/2013 6.7  6.0 - 8.3 g/dL Final  . Albumin 13/11/6576 3.2* 3.5 - 5.2 g/dL Final  . AST 46/96/2952 24  0 - 37 U/L Final  . ALT 02/10/2013 19  0 - 35 U/L Final  . Alkaline Phosphatase 02/10/2013 91  39 - 117 U/L Final  . Total Bilirubin 02/10/2013 0.6  0.3 - 1.2 mg/dL Final  . GFR calc non Af Amer 02/10/2013 75* >90 mL/min Final  . GFR calc Af Amer 02/10/2013 86* >90 mL/min Final   Comment: (NOTE)                          The eGFR has been calculated using the CKD EPI equation.                          This calculation has not been validated in all clinical situations.                          eGFR's persistently <90 mL/min signify possible Chronic Kidney                          Disease.  . Lipase 02/10/2013 22  11 - 59 U/L Final  . Color, Urine 02/10/2013 YELLOW  YELLOW Final  . APPearance 02/10/2013 CLOUDY* CLEAR Final  . Specific Gravity, Urine 02/10/2013 1.015  1.005 - 1.030 Final  . pH 02/10/2013 7.0  5.0 - 8.0 Final  . Glucose, UA 02/10/2013 NEGATIVE  NEGATIVE mg/dL Final  . Hgb urine dipstick 02/10/2013 TRACE* NEGATIVE Final  . Bilirubin Urine 02/10/2013 NEGATIVE  NEGATIVE Final  . Ketones, ur 02/10/2013 NEGATIVE  NEGATIVE mg/dL Final  . Protein, ur 84/13/2440 NEGATIVE  NEGATIVE mg/dL Final  . Urobilinogen, UA 02/10/2013 4.0* 0.0 - 1.0 mg/dL Final  . Nitrite 01/30/2535 POSITIVE* NEGATIVE Final  . Leukocytes, UA 02/10/2013 NEGATIVE  NEGATIVE Final  . Pro B Natriuretic peptide (BNP) 02/10/2013 193.2* 0 - 125 pg/mL Final  . Troponin I 02/10/2013 <0.30  <0.30 ng/mL Final   Comment:                                 Due to the release kinetics of cTnI,                           a negative result within the first hours                          of the onset of symptoms does not rule out                          myocardial infarction with certainty.  If myocardial infarction is still suspected,                          repeat the test at appropriate intervals.  . Squamous Epithelial / LPF 02/10/2013 RARE  RARE Final  . WBC, UA 02/10/2013 3-6  <3 WBC/hpf Final  . RBC / HPF 02/10/2013 0-2  <3 RBC/hpf Final  . Bacteria, UA 02/10/2013 MANY* RARE Final  . Specimen Description 02/10/2013 URINE, CLEAN CATCH   Final  . Special Requests 02/10/2013 NONE   Final  . Culture  Setup Time 02/10/2013    Final                   Value:02/11/2013 23:25                         Performed at [facility]  . Culture 02/10/2013    Final                   Value:>=100,000 COLONIES/mL ESCHERICHIA COLI                         Performed at [facility]  . Report Status 02/10/2013 02/13/2013 FINAL   Final  . Organism ID, Bacteria 02/10/2013 ESCHERICHIA COLI   Final  . Specimen Description 02/10/2013 BLOOD RIGHT HAND   Final  . Special Requests 02/10/2013 BOTTLES DRAWN AEROBIC AND ANAEROBIC 8CC   Final  . Culture 02/10/2013 NO GROWTH 5 DAYS   Final  . Report Status 02/10/2013 02/15/2013 FINAL   Final  . Specimen Description 02/10/2013 BLOOD LEFT ANTECUBITAL   Final  . Special Requests 02/10/2013 BOTTLES DRAWN AEROBIC AND ANAEROBIC 8CC   Final  . Culture 02/10/2013 NO GROWTH 5 DAYS   Final  . Report Status 02/10/2013 02/15/2013 FINAL   Final  . ABO/RH(D) 02/10/2013 A POS   Final  . Antibody Screen 02/10/2013 NEG   Final  . Sample Expiration 02/10/2013 02/13/2013   Final  . Unit Number 02/10/2013 [id number]   Final  . Blood Component Type 02/10/2013 RED CELLS,LR   Final  . Unit division 02/10/2013 00   Final  . Status of Unit 02/10/2013 ISSUED,FINAL   Final  . Transfusion Status 02/10/2013 OK TO TRANSFUSE   Final  . Crossmatch Result 02/10/2013  Compatible   Final  . Unit Number 02/10/2013 [id number]   Final  . Blood Component Type 02/10/2013 RED CELLS,LR   Final  . Unit division 02/10/2013 00   Final  . Status of Unit 02/10/2013 ISSUED,FINAL   Final  . Transfusion Status 02/10/2013 OK TO TRANSFUSE   Final  . Crossmatch Result 02/10/2013 Compatible   Final  . Order Confirmation 02/10/2013 ORDER PROCESSED BY BLOOD BANK   Final  . Sodium 02/11/2013 130* 135 - 145 mEq/L Final  . Potassium 02/11/2013 4.0  3.5 - 5.1 mEq/L Final  . Chloride 02/11/2013 94* 96 - 112 mEq/L Final  . CO2 02/11/2013 27  19 - 32 mEq/L Final  . Glucose, Bld 02/11/2013 109* 70 - 99 mg/dL Final  . BUN 30/86/5784 15  6 - 23 mg/dL Final  . Creatinine, Ser 02/11/2013 0.71  0.50 - 1.10 mg/dL Final  . Calcium 69/62/9528 9.2  8.4 - 10.5 mg/dL Final  . Total Protein 02/11/2013 6.8  6.0 - 8.3 g/dL Final  . Albumin 41/32/4401 3.3* 3.5 - 5.2 g/dL Final  . AST  02/11/2013 20  0 - 37 U/L Final  . ALT 02/11/2013 18  0 - 35 U/L Final  . Alkaline Phosphatase 02/11/2013 96  39 - 117 U/L Final  . Total Bilirubin 02/11/2013 1.0  0.3 - 1.2 mg/dL Final  . GFR calc non Af Amer 02/11/2013 87* >90 mL/min Final  . GFR calc Af Amer 02/11/2013 >90  >90 mL/min Final   Comment: (NOTE)                          The eGFR has been calculated using the CKD EPI equation.                          This calculation has not been validated in all clinical situations.                          eGFR's persistently <90 mL/min signify possible Chronic Kidney                          Disease.  . WBC 02/11/2013 3.7* 4.0 - 10.5 K/uL Final  . RBC 02/11/2013 2.94* 3.87 - 5.11 MIL/uL Final  . Hemoglobin 02/11/2013 9.2* 12.0 - 15.0 g/dL Final   Comment: DELTA CHECK NOTED                          POST TRANSFUSION SPECIMEN  . HCT 02/11/2013 27.5* 36.0 - 46.0 % Final  . MCV 02/11/2013 93.5  78.0 - 100.0 fL Final  . MCH 02/11/2013 31.3  26.0 - 34.0 pg Final  . MCHC 02/11/2013 33.5  30.0 - 36.0 g/dL Final    . RDW 16/01/9603 18.1* 11.5 - 15.5 % Final  . Platelets 02/11/2013 PLATELET CLUMPS NOTED ON SMEAR, COUNT APPEARS DECREASED  150 - 400 K/uL Final   SMEAR STAINED AND AVAILABLE FOR REVIEW  . Prothrombin Time 02/11/2013 13.8  11.6 - 15.2 seconds Final  . INR 02/11/2013 1.08  0.00 - 1.49 Final  . WBC 02/11/2013 3.6* 4.0 - 10.5 K/uL Final  . RBC 02/11/2013 2.77* 3.87 - 5.11 MIL/uL Final  . Hemoglobin 02/11/2013 8.8* 12.0 - 15.0 g/dL Final  . HCT 54/12/8117 26.0* 36.0 - 46.0 % Final  . MCV 02/11/2013 93.9  78.0 - 100.0 fL Final  . MCH 02/11/2013 31.8  26.0 - 34.0 pg Final  . MCHC 02/11/2013 33.8  30.0 - 36.0 g/dL Final  . RDW 14/78/2956 18.2* 11.5 - 15.5 % Final  . Platelets 02/11/2013 59* 150 - 400 K/uL Final  . Neutrophils Relative % 02/11/2013 49  43 - 77 % Final  . Lymphocytes Relative 02/11/2013 33  12 - 46 % Final  . Monocytes Relative 02/11/2013 18* 3 - 12 % Final  . Eosinophils Relative 02/11/2013 0  0 - 5 % Final  . Basophils Relative 02/11/2013 0  0 - 1 % Final  . Neutro Abs 02/11/2013 1.8  1.7 - 7.7 K/uL Final  . Lymphs Abs 02/11/2013 1.2  0.7 - 4.0 K/uL Final  . Monocytes Absolute 02/11/2013 0.6  0.1 - 1.0 K/uL Final  . Eosinophils Absolute 02/11/2013 0.0  0.0 - 0.7 K/uL Final  . Basophils Absolute 02/11/2013 0.0  0.0 - 0.1 K/uL Final  . RBC Morphology 02/11/2013 POLYCHROMASIA PRESENT   Final   SPHEROCYTES  . WBC Morphology 02/11/2013 TOXIC GRANULATION  Final  . Smear Review 02/11/2013 PLATELET COUNT CONFIRMED BY SMEAR   Final  . Sodium 02/12/2013 137  135 - 145 mEq/L Final   DELTA CHECK NOTED  . Potassium 02/12/2013 3.7  3.5 - 5.1 mEq/L Final  . Chloride 02/12/2013 102  96 - 112 mEq/L Final  . CO2 02/12/2013 26  19 - 32 mEq/L Final  . Glucose, Bld 02/12/2013 104* 70 - 99 mg/dL Final  . BUN 40/98/1191 13  6 - 23 mg/dL Final  . Creatinine, Ser 02/12/2013 0.56  0.50 - 1.10 mg/dL Final  . Calcium 47/82/9562 9.2  8.4 - 10.5 mg/dL Final  . GFR calc non Af Amer 02/12/2013 >90   >90 mL/min Final  . GFR calc Af Amer 02/12/2013 >90  >90 mL/min Final   Comment: (NOTE)                          The eGFR has been calculated using the CKD EPI equation.                          This calculation has not been validated in all clinical situations.                          eGFR's persistently <90 mL/min signify possible Chronic Kidney                          Disease.  . WBC 02/12/2013 3.7* 4.0 - 10.5 K/uL Final  . RBC 02/12/2013 2.75* 3.87 - 5.11 MIL/uL Final  . Hemoglobin 02/12/2013 8.8* 12.0 - 15.0 g/dL Final  . HCT 13/11/6576 26.2* 36.0 - 46.0 % Final  . MCV 02/12/2013 95.3  78.0 - 100.0 fL Final  . MCH 02/12/2013 32.0  26.0 - 34.0 pg Final  . MCHC 02/12/2013 33.6  30.0 - 36.0 g/dL Final  . RDW 46/96/2952 18.7* 11.5 - 15.5 % Final  . Platelets 02/12/2013 67* 150 - 400 K/uL Final   SPECIMEN CHECKED FOR CLOTS  . Vancomycin Tr 02/12/2013 <5.0* 10.0 - 20.0 ug/mL Final    PATHOLOGY: Adenocarcinoma.  Urinalysis    Component Value Date/Time   COLORURINE YELLOW 02/10/2013 1650   APPEARANCEUR CLOUDY* 02/10/2013 1650   LABSPEC 1.015 02/10/2013 1650   PHURINE 7.0 02/10/2013 1650   GLUCOSEU NEGATIVE 02/10/2013 1650   HGBUR TRACE* 02/10/2013 1650   BILIRUBINUR NEGATIVE 02/10/2013 1650   KETONESUR NEGATIVE 02/10/2013 1650   PROTEINUR NEGATIVE 02/10/2013 1650   UROBILINOGEN 4.0* 02/10/2013 1650   NITRITE POSITIVE* 02/10/2013 1650   LEUKOCYTESUR NEGATIVE 02/10/2013 1650    RADIOGRAPHIC STUDIES: US Venous Img Lower Bilateral  02/11/2013   CLINICAL DATA:  Swelling in bilateral lower extremities. History of cervical cancer and possible breast cancer.  EXAM: VENOUS DOPPLER ULTRASOUND OF BILATERAL LOWER EXTREMITIES  TECHNIQUE: Gray-scale sonography with graded compression, as well as color Doppler and duplex ultrasound, were performed to evaluate the deep venous system from the level of the common femoral vein through the popliteal and proximal calf veins. Spectral Doppler was utilized to  evaluate flow at rest and with distal augmentation maneuvers.  COMPARISON:  None.  FINDINGS: Thrombus within deep veins:  None visualized.  Compressibility of deep veins:  Normal.  Duplex waveform respiratory phasicity:  Normal.  Duplex waveform response to augmentation:  Normal.  Venous reflux:  None visualized.  Other findings: There is  incompressibility of the distal right greater saphenous vein below the level of the knee without color flow demonstrated consistent with superficial thrombophlebitis. There is incompressibility of the left mid and distal greater saphenous vein without color flow demonstrated consistent with superficial thrombophlebitis.  IMPRESSION: No evidence of deep venous thrombosis of bilateral lower extremities.  Superficial thrombophlebitis of the distal right greater saphenous vein below the level of the knee.  Superficial thrombophlebitis of the mid-distal left greater saphenous vein.   Electronically Signed   By: Elige Ko   On: 02/11/2013 09:35    ASSESSMENT:  #1. 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 #2. Anemia secondary to chemotherapy, hemoglobin improved to 10.4 after transfusion of 2 units of packed red blood cells yesterday. #3. Coronary artery disease, no evidence of dysrhythmia or heart failure at this time. #4. Hypertension, controlled. #5. Hypothyroidism, on treatment. #6. Gastroesophageal reflux disease. #7. Peripheral neuropathy secondary chemotherapy.  PLAN:  #1. Discontinue carboplatin and Taxol. #2. PET scan to assess response and if evidence of residual disease is found, repeat biopsy will be done with Foundation 1 analysis.  If no disease is found, chemotherapy holiday we'll be offered. #3. Followup after PET/CT scan for discussion of future plans.   All questions were answered. The patient knows to call the clinic with any problems, questions or concerns. We can certainly see the patient much sooner if necessary.   I spent 25 minutes counseling the patient face to face. The total time spent in the appointment was 30 minutes.    Maurilio Lovely, MD 03/13/2013 9:55 AM

## 2013-03-13 NOTE — Progress Notes (Signed)
Labs drawn today for cbc/diff,cmp 

## 2013-03-13 NOTE — Patient Instructions (Signed)
.  [facility] Discharge Instructions  RECOMMENDATIONS MADE BY THE CONSULTANT AND ANY TEST RESULTS WILL BE SENT TO YOUR REFERRING PHYSICIAN.  EXAM FINDINGS BY THE PHYSICIAN TODAY AND SIGNS OR SYMPTOMS TO REPORT TO CLINIC OR PRIMARY PHYSICIAN: Exam and findings as discussed by Dr. Zigmund Daniel. We will stop treatment for now. Pet scan in the next 2 weeks  INSTRUCTIONS/FOLLOW-UP: After pet scan  Thank you for choosing [facility] to provide your oncology and hematology care.  To afford each patient quality time with our providers, please arrive at least 15 minutes before your scheduled appointment time.  With your help, our goal is to use those 15 minutes to complete the necessary work-up to ensure our physicians have the information they need to help with your evaluation and healthcare recommendations.    Effective January 1st, 2014, we ask that you re-schedule your appointment with our physicians should you arrive 10 or more minutes late for your appointment.  We strive to give you quality time with our providers, and arriving late affects you and other patients whose appointments are after yours.    Again, thank you for choosing [facility].  Our hope is that these requests will decrease the amount of time that you wait before being seen by our physicians.       _____________________________________________________________  Should you have questions after your visit to [facility], please contact our office at [phone] between the hours of 8:30 a.m. and 5:00 p.m.  Voicemails left after 4:30 p.m. will not be returned until the following business day.  For prescription refill requests, have your pharmacy contact our office with your prescription refill request.

## 2013-03-13 NOTE — Progress Notes (Signed)
Peripheral stick done for cbc diff and cmet by A. Nance, lab tech.  MD in to exam pt. Decision made to halt chemo for now and to do PET scan in two weeks.  Home accompanied by family member at 66.

## 2013-03-15 NOTE — Telephone Encounter (Signed)
C. telephone entry.

## 2013-03-19 ENCOUNTER — Other Ambulatory Visit (HOSPITAL_COMMUNITY): Payer: Self-pay | Admitting: Oncology

## 2013-03-19 DIAGNOSIS — F329 Major depressive disorder, single episode, unspecified: Secondary | ICD-10-CM

## 2013-03-19 DIAGNOSIS — F32A Depression, unspecified: Secondary | ICD-10-CM

## 2013-03-19 DIAGNOSIS — C801 Malignant (primary) neoplasm, unspecified: Secondary | ICD-10-CM

## 2013-03-19 MED ORDER — DULOXETINE HCL 30 MG PO CPEP: 30.0000 mg | ORAL_CAPSULE | Freq: Every day | ORAL | Status: DC

## 2013-03-19 MED ORDER — OXYCODONE HCL 10 MG PO TABS: 10.0000 mg | ORAL_TABLET | ORAL | Status: DC | PRN

## 2013-03-22 ENCOUNTER — Encounter (HOSPITAL_COMMUNITY): Payer: Self-pay

## 2013-03-22 ENCOUNTER — Encounter (HOSPITAL_COMMUNITY)
Admission: RE | Admit: 2013-03-22 | Discharge: 2013-03-22 | Disposition: A | Discharge status: Home / Self Care | Payer: PRIVATE HEALTH INSURANCE | Source: Ambulatory Visit | Attending: Hematology and Oncology | Admitting: Hematology and Oncology

## 2013-03-22 DIAGNOSIS — C801 Malignant (primary) neoplasm, unspecified: Secondary | ICD-10-CM

## 2013-03-22 DIAGNOSIS — R918 Other nonspecific abnormal finding of lung field: Secondary | ICD-10-CM | POA: Insufficient documentation

## 2013-03-22 DIAGNOSIS — N838 Other noninflammatory disorders of ovary, fallopian tube and broad ligament: Secondary | ICD-10-CM | POA: Insufficient documentation

## 2013-03-22 LAB — GLUCOSE, CAPILLARY: Glucose-Capillary: 93 mg/dL (ref 70–99)

## 2013-03-22 MED ORDER — FLUDEOXYGLUCOSE F - 18 (FDG) INJECTION: 19.0000 | Freq: Once | INTRAVENOUS | Status: AC | PRN

## 2013-03-27 ENCOUNTER — Encounter (HOSPITAL_COMMUNITY): Payer: Self-pay

## 2013-03-27 ENCOUNTER — Encounter (HOSPITAL_BASED_OUTPATIENT_CLINIC_OR_DEPARTMENT_OTHER): Payer: PRIVATE HEALTH INSURANCE

## 2013-03-27 VITALS — BP 111/61 | HR 98 | Temp 98.7°F | Resp 16 | Wt 152.6 lb

## 2013-03-27 DIAGNOSIS — C801 Malignant (primary) neoplasm, unspecified: Secondary | ICD-10-CM

## 2013-03-27 DIAGNOSIS — D702 Other drug-induced agranulocytosis: Secondary | ICD-10-CM

## 2013-03-27 DIAGNOSIS — G622 Polyneuropathy due to other toxic agents: Secondary | ICD-10-CM

## 2013-03-27 DIAGNOSIS — G893 Neoplasm related pain (acute) (chronic): Secondary | ICD-10-CM

## 2013-03-27 DIAGNOSIS — D6481 Anemia due to antineoplastic chemotherapy: Secondary | ICD-10-CM

## 2013-03-27 DIAGNOSIS — IMO0002 Reserved for concepts with insufficient information to code with codable children: Secondary | ICD-10-CM

## 2013-03-27 DIAGNOSIS — C786 Secondary malignant neoplasm of retroperitoneum and peritoneum: Secondary | ICD-10-CM

## 2013-03-27 LAB — COMPREHENSIVE METABOLIC PANEL
ALT: 10 U/L (ref 0–35)
AST: 18 U/L (ref 0–37)
Albumin: 3.9 g/dL (ref 3.5–5.2)
Alkaline Phosphatase: 75 U/L (ref 39–117)
BUN: 14 mg/dL (ref 6–23)
CO2: 28 mEq/L (ref 19–32)
Chloride: 98 mEq/L (ref 96–112)
Potassium: 3.7 mEq/L (ref 3.5–5.1)
Sodium: 136 mEq/L (ref 135–145)
Total Bilirubin: 0.4 mg/dL (ref 0.3–1.2)
Total Protein: 7.8 g/dL (ref 6.0–8.3)

## 2013-03-27 LAB — CBC WITH DIFFERENTIAL/PLATELET
Basophils Absolute: 0 10*3/uL (ref 0.0–0.1)
Basophils Relative: 0 % (ref 0–1)
HCT: 30.7 % — ABNORMAL LOW (ref 36.0–46.0)
Hemoglobin: 10.1 g/dL — ABNORMAL LOW (ref 12.0–15.0)
Lymphocytes Relative: 36 % (ref 12–46)
MCHC: 32.9 g/dL (ref 30.0–36.0)
MCV: 93.9 fL (ref 78.0–100.0)
Monocytes Absolute: 0.4 10*3/uL (ref 0.1–1.0)
Monocytes Relative: 9 % (ref 3–12)
Neutro Abs: 2.2 10*3/uL (ref 1.7–7.7)
Neutrophils Relative %: 52 % (ref 43–77)
Platelets: 74 10*3/uL — ABNORMAL LOW (ref 150–400)
WBC: 4.3 10*3/uL (ref 4.0–10.5)

## 2013-03-27 MED ORDER — OXYCODONE HCL ER 20 MG PO T12A: 20.0000 mg | EXTENDED_RELEASE_TABLET | Freq: Two times a day (BID) | ORAL | Status: DC

## 2013-03-27 NOTE — Progress Notes (Signed)
Labs drawn today for cbc/diff,cmp 

## 2013-03-27 NOTE — Progress Notes (Signed)
[facility]  OFFICE PROGRESS NOTE  [name], MD [address] [place] [address]  DIAGNOSIS: Cancer of unknown origin  Peritoneal carcinomatosis  Peripheral neuropathy, secondary to drugs or chemicals  Chief Complaint  Patient presents with  . Peritoneal carcinomatosis    CURRENT THERAPY: 4 cycles of carboplatin/Taxol last being on 02/21/2013 requiring significant hematologic support with the development of worsening peripheral neuropathy.  INTERVAL HISTORY: Erika Moreno 67 y.o. female returns for followup of peritoneal carcinomatosis, status post inguinal lymph node biopsy to make the diagnosis, status post 4 cycles of carboplatin and Taxol with demonstration of a complete clinical response after 2 cycles and after 4 cycles developed significant myelotoxicity and peripheral neuropathy. PET scan was done to assess response and patient is here today to discuss the results.  Red cell transfusion was required on 03/09/2013. She is experiencing extremity pain particularly when oxycodone wears off. She's also had some redness involving both lower extremities that feels like a burning sensation and when she sneezes or bruits she has more severe pain in both lower extremities. She denies any nausea, vomiting, abdominal distention, sore throat, or insomnia. Appetite is good with no sore throat but with some fatigue with exertion. She denies any fever, night sweats, headache, skin rash, or seizures.  MEDICAL HISTORY: Past Medical History  Diagnosis Date  . 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 Cervical cancer 1984  . Myocardial infarction 20 yrs ago  . Coronary artery disease   . Anxiety   . Chronic back pain   . Depression   . GERD (gastroesophageal reflux disease)   . Hypertension   . Hypothyroidism   . Anemia   . Vitamin B 12 deficiency   . Vitamin D deficiency disease     INTERIM HISTORY: has  Cancer of unknown origin; Anemia due to chemotherapy; Acute sinusitis; Normocytic anemia; Antineoplastic chemotherapy induced pancytopenia; UTI (lower urinary tract infection); Dyspnea on exertion; Neuropathy due to chemotherapeutic drug; and Thrombophlebitis leg superficial on her problem list.   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  Unfortunately, Shontae was admitted to the [facility] on 02/10/2013 and discharged on 02/12/2013. She was diagnosed with cellulitis and treated with antibiotics with rapid improvement. She received cycle #4 of carboplatin and Taxol on 02/21/2013. She developed nausea and was placed on Zofran since metoclopramide was not working well for delayed nausea. She subsequently required blood transfusions and develop worsening neuropathy. PET scan was performed to assess disease status.  ALLERGIES:  is allergic to morphine and related and sulfa antibiotics.  MEDICATIONS: has a current medication list which includes the following prescription(s): alprazolam, aspirin ec, clopidogrel, cyclobenzaprine, duloxetine, furosemide, isosorbide mononitrate, lidocaine-prilocaine, lisinopril-hydrochlorothiazide, nitroglycerin, oxycodone hcl, oxymetazoline, potassium chloride sa, sodium chloride, venlafaxine, atorvastatin, dexamethasone, dexlansoprazole, metoclopramide, ondansetron, and oxycodone.  SURGICAL HISTORY:  Past Surgical History  Procedure Laterality Date  . Heart bypass      triple bypass with stents  . Coronary artery bypass graft      3 vessels  . Cardiac catheterization      stents x3  . Abdominal hysterectomy    . Portacath placement Right 12/06/2012    Procedure: INSERTION PORT-A-CATH;  Surgeon: Fabio Bering, MD;  Location: [facility];  Service: General;  Laterality:  Right;    FAMILY HISTORY: family  history includes Heart attack in her father and mother.  SOCIAL HISTORY:  reports that she quit smoking about 24 years ago. Her smoking use included Cigarettes. She has a 44 pack-year smoking history. She has never used smokeless tobacco. She reports that she does not drink alcohol or use illicit drugs.  REVIEW OF SYSTEMS:  Other than that discussed above is noncontributory.  PHYSICAL EXAMINATION: ECOG PERFORMANCE STATUS: 1 - Symptomatic but completely ambulatory  Blood pressure 111/61, pulse 98, temperature 98.7 F (37.1 C), temperature source Oral, resp. rate 16, weight 152 lb 9.6 oz (69.219 kg).  GENERAL:alert, no distress and comfortable SKIN: skin color, texture, turgor are normal, no rashes or significant lesions EYES: PERLA; Conjunctiva are pink and non-injected, sclera clear OROPHARYNX:no exudate, no erythema on lips, buccal mucosa, or tongue. NECK: supple, thyroid normal size, non-tender, without nodularity. No masses CHEST: LifePort in place. No breast masses. LYMPH:  no palpable lymphadenopathy in the cervical, axillary or inguinal LUNGS: clear to auscultation and percussion with normal breathing effort HEART: regular rate & rhythm and no murmurs. ABDOMEN:abdomen soft, non-tender and normal bowel sounds. No masses. No rebound tenderness. MUSCULOSKELETAL:no cyanosis of digits and no clubbing. Range of motion normal.  NEURO: alert & oriented x 3 with fluent speech, no focal motor/sensory deficits. Bilateral lower 70 hyperreflexia.   LABORATORY DATA: Infusion on 03/27/2013  Component Date Value Range Status  . WBC 03/27/2013 4.3  4.0 - 10.5 K/uL Final  . RBC 03/27/2013 3.27* 3.87 - 5.11 MIL/uL Final  . Hemoglobin 03/27/2013 10.1* 12.0 - 15.0 g/dL Final  . HCT 41/32/4401 30.7* 36.0 - 46.0 % Final  . MCV 03/27/2013 93.9  78.0 - 100.0 fL Final  . MCH 03/27/2013 30.9  26.0 - 34.0 pg Final  . MCHC 03/27/2013 32.9  30.0 - 36.0 g/dL Final  . RDW 02/72/5366 17.1* 11.5 - 15.5 %  Final  . Platelets 03/27/2013 74* 150 - 400 K/uL Final   CONSISTENT WITH PREVIOUS RESULT  . Neutrophils Relative % 03/27/2013 52  43 - 77 % Final  . Neutro Abs 03/27/2013 2.2  1.7 - 7.7 K/uL Final  . Lymphocytes Relative 03/27/2013 36  12 - 46 % Final  . Lymphs Abs 03/27/2013 1.5  0.7 - 4.0 K/uL Final  . Monocytes Relative 03/27/2013 9  3 - 12 % Final  . Monocytes Absolute 03/27/2013 0.4  0.1 - 1.0 K/uL Final  . Eosinophils Relative 03/27/2013 4  0 - 5 % Final  . Eosinophils Absolute 03/27/2013 0.2  0.0 - 0.7 K/uL Final  . Basophils Relative 03/27/2013 0  0 - 1 % Final  . Basophils Absolute 03/27/2013 0.0  0.0 - 0.1 K/uL Final  . Sodium 03/27/2013 136  135 - 145 mEq/L Final  . Potassium 03/27/2013 3.7  3.5 - 5.1 mEq/L Final  . Chloride 03/27/2013 98  96 - 112 mEq/L Final  . CO2 03/27/2013 28  19 - 32 mEq/L Final  . Glucose, Bld 03/27/2013 94  70 - 99 mg/dL Final  . BUN 44/06/4740 14  6 - 23 mg/dL Final  . Creatinine, Ser 03/27/2013 0.58  0.50 - 1.10 mg/dL Final  . Calcium 59/56/3875 9.7  8.4 - 10.5 mg/dL Final  . Total Protein 03/27/2013 7.8  6.0 - 8.3 g/dL Final  . Albumin 64/33/2951 3.9  3.5 - 5.2 g/dL Final  . AST 88/41/6606 18  0 - 37 U/L Final  . ALT 03/27/2013 10  0 - 35  U/L Final  . Alkaline Phosphatase 03/27/2013 75  39 - 117 U/L Final  . Total Bilirubin 03/27/2013 0.4  0.3 - 1.2 mg/dL Final  . GFR calc non Af Amer 03/27/2013 >90  >90 mL/min Final  . GFR calc Af Amer 03/27/2013 >90  >90 mL/min Final   Comment: (NOTE)                          The eGFR has been calculated using the CKD EPI equation.                          This calculation has not been validated in all clinical situations.                          eGFR's persistently <90 mL/min signify possible Chronic Kidney                          Disease.  Hospital Outpatient Visit on 03/22/2013  Component Date Value Range Status  . Glucose-Capillary 03/22/2013 93  70 - 99 mg/dL Final  Infusion on 78/29/5621    Component Date Value Range Status  . WBC 03/13/2013 3.3* 4.0 - 10.5 K/uL Final  . RBC 03/13/2013 3.37* 3.87 - 5.11 MIL/uL Final  . Hemoglobin 03/13/2013 10.4* 12.0 - 15.0 g/dL Final  . HCT 30/86/5784 31.9* 36.0 - 46.0 % Final  . MCV 03/13/2013 94.7  78.0 - 100.0 fL Final  . MCH 03/13/2013 30.9  26.0 - 34.0 pg Final  . MCHC 03/13/2013 32.6  30.0 - 36.0 g/dL Final  . RDW 69/62/9528 19.8* 11.5 - 15.5 % Final  . Platelets 03/13/2013 89* 150 - 400 K/uL Final   Comment: SPECIMEN CHECKED FOR CLOTS                          PLATELETS APPEAR DECREASED                          PLATELET COUNT CONFIRMED BY SMEAR  . Neutrophils Relative % 03/13/2013 52  43 - 77 % Final  . Neutro Abs 03/13/2013 1.7  1.7 - 7.7 K/uL Final  . Lymphocytes Relative 03/13/2013 34  12 - 46 % Final  . Lymphs Abs 03/13/2013 1.1  0.7 - 4.0 K/uL Final  . Monocytes Relative 03/13/2013 12  3 - 12 % Final  . Monocytes Absolute 03/13/2013 0.4  0.1 - 1.0 K/uL Final  . Eosinophils Relative 03/13/2013 1  0 - 5 % Final  . Eosinophils Absolute 03/13/2013 0.0  0.0 - 0.7 K/uL Final  . Basophils Relative 03/13/2013 1  0 - 1 % Final  . Basophils Absolute 03/13/2013 0.0  0.0 - 0.1 K/uL Final  . Sodium 03/13/2013 137  135 - 145 mEq/L Final  . Potassium 03/13/2013 4.1  3.5 - 5.1 mEq/L Final  . Chloride 03/13/2013 102  96 - 112 mEq/L Final  . CO2 03/13/2013 27  19 - 32 mEq/L Final  . Glucose, Bld 03/13/2013 92  70 - 99 mg/dL Final  . BUN 41/32/4401 5* 6 - 23 mg/dL Final  . Creatinine, Ser 03/13/2013 0.64  0.50 - 1.10 mg/dL Final  . Calcium 02/72/5366 9.7  8.4 - 10.5 mg/dL Final  . Total Protein 03/13/2013 7.0  6.0 - 8.3 g/dL Final  .  Albumin 03/13/2013 3.4* 3.5 - 5.2 g/dL Final  . AST 16/01/9603 20  0 - 37 U/L Final  . ALT 03/13/2013 12  0 - 35 U/L Final  . Alkaline Phosphatase 03/13/2013 78  39 - 117 U/L Final  . Total Bilirubin 03/13/2013 0.5  0.3 - 1.2 mg/dL Final  . GFR calc non Af Amer 03/13/2013 >90  >90 mL/min Final  . GFR calc  Af Amer 03/13/2013 >90  >90 mL/min Final   Comment: (NOTE)                          The eGFR has been calculated using the CKD EPI equation.                          This calculation has not been validated in all clinical situations.                          eGFR's persistently <90 mL/min signify possible Chronic Kidney                          Disease.  Infusion on 03/12/2013  Component Date Value Range Status  . Order Confirmation 03/12/2013 ORDER PROCESSED BY BLOOD BANK   Final  Infusion on 03/09/2013  Component Date Value Range Status  . WBC 03/09/2013 2.0* 4.0 - 10.5 K/uL Final  . RBC 03/09/2013 2.50* 3.87 - 5.11 MIL/uL Final  . Hemoglobin 03/09/2013 8.0* 12.0 - 15.0 g/dL Final  . HCT 54/12/8117 24.6* 36.0 - 46.0 % Final  . MCV 03/09/2013 98.4  78.0 - 100.0 fL Final  . MCH 03/09/2013 32.0  26.0 - 34.0 pg Final  . MCHC 03/09/2013 32.5  30.0 - 36.0 g/dL Final  . RDW 14/78/2956 19.9* 11.5 - 15.5 % Final  . Platelets 03/09/2013 119* 150 - 400 K/uL Final   SPECIMEN CHECKED FOR CLOTS  . Neutrophils Relative % 03/09/2013 37* 43 - 77 % Final  . Neutro Abs 03/09/2013 0.7* 1.7 - 7.7 K/uL Final  . Lymphocytes Relative 03/09/2013 42  12 - 46 % Final  . Lymphs Abs 03/09/2013 0.8  0.7 - 4.0 K/uL Final  . Monocytes Relative 03/09/2013 18* 3 - 12 % Final  . Monocytes Absolute 03/09/2013 0.4  0.1 - 1.0 K/uL Final  . Eosinophils Relative 03/09/2013 1  0 - 5 % Final  . Eosinophils Absolute 03/09/2013 0.0  0.0 - 0.7 K/uL Final  . Basophils Relative 03/09/2013 1  0 - 1 % Final  . Basophils Absolute 03/09/2013 0.0  0.0 - 0.1 K/uL Final  . ABO/RH(D) 03/09/2013 A POS   Final  . Antibody Screen 03/09/2013 NEG   Final  . Sample Expiration 03/09/2013 03/12/2013   Final  . Unit Number 03/09/2013 [id number]   Final  . Blood Component Type 03/09/2013 RED CELLS,LR   Final  . Unit division 03/09/2013 00   Final  . Status of Unit 03/09/2013 ISSUED,FINAL   Final  . Transfusion Status 03/09/2013 OK TO  TRANSFUSE   Final  . Crossmatch Result 03/09/2013 Compatible   Final  . Unit Number 03/09/2013 [id number]   Final  . Blood Component Type 03/09/2013 RED CELLS,LR   Final  . Unit division 03/09/2013 00   Final  . Status of Unit 03/09/2013 ISSUED,FINAL   Final  . Transfusion Status 03/09/2013 OK TO TRANSFUSE   Final  .  Crossmatch Result 03/09/2013 Compatible   Final  Infusion on 02/27/2013  Component Date Value Range Status  . WBC 02/27/2013 1.8* 4.0 - 10.5 K/uL Final  . RBC 02/27/2013 2.62* 3.87 - 5.11 MIL/uL Final  . Hemoglobin 02/27/2013 8.4* 12.0 - 15.0 g/dL Final  . HCT 16/01/9603 24.5* 36.0 - 46.0 % Final  . MCV 02/27/2013 93.5  78.0 - 100.0 fL Final  . MCH 02/27/2013 32.1  26.0 - 34.0 pg Final  . MCHC 02/27/2013 34.3  30.0 - 36.0 g/dL Final  . RDW 54/12/8117 17.8* 11.5 - 15.5 % Final  . Platelets 02/27/2013 139* 150 - 400 K/uL Final  . Neutrophils Relative % 02/27/2013 61  43 - 77 % Final  . Neutro Abs 02/27/2013 1.1* 1.7 - 7.7 K/uL Final  . Lymphocytes Relative 02/27/2013 33  12 - 46 % Final  . Lymphs Abs 02/27/2013 0.6* 0.7 - 4.0 K/uL Final  . Monocytes Relative 02/27/2013 2* 3 - 12 % Final  . Monocytes Absolute 02/27/2013 0.0* 0.1 - 1.0 K/uL Final  . Eosinophils Relative 02/27/2013 3  0 - 5 % Final  . Eosinophils Absolute 02/27/2013 0.1  0.0 - 0.7 K/uL Final  . Basophils Relative 02/27/2013 1  0 - 1 % Final  . Basophils Absolute 02/27/2013 0.0  0.0 - 0.1 K/uL Final  . Sodium 02/27/2013 132* 135 - 145 mEq/L Final  . Potassium 02/27/2013 3.6  3.5 - 5.1 mEq/L Final  . Chloride 02/27/2013 94* 96 - 112 mEq/L Final  . CO2 02/27/2013 28  19 - 32 mEq/L Final  . Glucose, Bld 02/27/2013 133* 70 - 99 mg/dL Final  . BUN 14/78/2956 14  6 - 23 mg/dL Final  . Creatinine, Ser 02/27/2013 1.10  0.50 - 1.10 mg/dL Final  . Calcium 21/30/8657 9.8  8.4 - 10.5 mg/dL Final  . Total Protein 02/27/2013 6.8  6.0 - 8.3 g/dL Final  . Albumin 84/69/6295 3.5  3.5 - 5.2 g/dL Final  . AST 28/41/3244  30  0 - 37 U/L Final  . ALT 02/27/2013 23  0 - 35 U/L Final  . Alkaline Phosphatase 02/27/2013 82  39 - 117 U/L Final  . Total Bilirubin 02/27/2013 0.5  0.3 - 1.2 mg/dL Final  . GFR calc non Af Amer 02/27/2013 51* >90 mL/min Final  . GFR calc Af Amer 02/27/2013 59* >90 mL/min Final   Comment: (NOTE)                          The eGFR has been calculated using the CKD EPI equation.                          This calculation has not been validated in all clinical situations.                          eGFR's persistently <90 mL/min signify possible Chronic Kidney                          Disease.    PATHOLOGY: No new pathology.  Urinalysis    Component Value Date/Time   COLORURINE YELLOW 02/10/2013 1650   APPEARANCEUR CLOUDY* 02/10/2013 1650   LABSPEC 1.015 02/10/2013 1650   PHURINE 7.0 02/10/2013 1650   GLUCOSEU NEGATIVE 02/10/2013 1650   HGBUR TRACE* 02/10/2013 1650   BILIRUBINUR NEGATIVE 02/10/2013 1650   KETONESUR  NEGATIVE 02/10/2013 1650   PROTEINUR NEGATIVE 02/10/2013 1650   UROBILINOGEN 4.0* 02/10/2013 1650   NITRITE POSITIVE* 02/10/2013 1650   LEUKOCYTESUR NEGATIVE 02/10/2013 1650    RADIOGRAPHIC STUDIES: Nm Pet Image Restag (ps) Skull Base To Thigh  03/22/2013   CLINICAL DATA:  Subsequent treatment strategy for metastatic adenocarcinoma of unknown primary. Abdominal disease. [name]  EXAM: NUCLEAR MEDICINE PET SKULL BASE TO THIGH  FASTING BLOOD GLUCOSE:  Value: 93mg /dl  TECHNIQUE: 16.1 mCi W-96 FDG was injected intravenously. CT data was obtained and used for attenuation correction and anatomic localization only. (This was not acquired as a diagnostic CT examination.) Additional exam technical data entered on technologist worksheet.  COMPARISON:  NM PET IMAGE INITIAL (PI) SKULL BASE TO THIGH dated 10/03/2012; CT ABD - PELV W/ CM dated 01/12/2013  FINDINGS: NECK  No hypermetabolic lymph nodes in the neck.  CHEST  Interval resolution of the hypermetabolic mediastinal adenopathy. 15 mm subcarinal  lymph node remains with no metabolic activity above background blood pool activity. Mild metabolic activity associated with axillary lymph nodes (image 73 and 75) on the right left respectively is below blood pool activity. No suspicious pulmonary nodules. There is perihilar diffuse ground-glass opacities without nodularity. This finding is new from CT of 01/12/2013  ABDOMEN/PELVIS  Interval resolution of the hypermetabolic periaortic adenopathy. There are small sub cm left periaortic lymph nodes which have metabolic activity less than blood pool activity. The activity associated with the bilobed left adnexal lesions is also decreased in the interval. No significant metabolic activity remains. Large bilobed lesion in the left adnexa has decreased slightly in volume measuring measuring 6.9 x 3.7 cm compared to 8.7 x 5.3 cm on prior.  SKELETON  No focal hypermetabolic activity to suggest skeletal metastasis.  IMPRESSION: 1. No residual significant metabolic activity within the small lymph nodes of the axilla, mediastinum, and retroperitoneum to suggest active metastatic disease. Minimal residual activity is likely reactive. 2. No evidence disease progression or new disease. 3. Slight decrease in volume of bilobed lesion associated with the left adnexa. 4. New diffuse perihilar ground-glass pulmonary opacities. Differential includes pulmonary edema versus drug reaction.   Electronically Signed   By: Genevive Bi M.D.   On: 03/22/2013 14:23    ASSESSMENT: #1. Peritoneal carcinomatosis, status post 4 cycles of carboplatin and Taxol with complete clinical response by PET scan, status post inguinal lymph node biopsy to make a diagnosis. #2. Anemia secondary chemotherapy. #3. Peripheral neuropathy secondary chemotherapy, grade 3.   PLAN:  #1. Oxycodone-ER 20 mg every 12 hours with oxycodone 10 mg up to every 4 hours as needed for breakthrough pain. #2. Continue Cymbalta 30 mg at bedtime. #3. Consider Avastin  maintenance therapy to increase progression free survival. #4. Followup in 4 weeks for possible institution of Avastin therapy with further discuss. In the meantime third party approval will be sought.    All questions were answered. The patient knows to call the clinic with any problems, questions or concerns. We can certainly see the patient much sooner if necessary.   I spent 40 minutes counseling the patient face to face. The total time spent in the appointment was 55 minutes.    Maurilio Lovely, MD 03/27/2013 11:38 AM

## 2013-03-27 NOTE — Patient Instructions (Addendum)
[facility] Discharge Instructions  RECOMMENDATIONS MADE BY THE CONSULTANT AND ANY TEST RESULTS WILL BE SENT TO YOUR REFERRING PHYSICIAN.  EXAM FINDINGS BY THE PHYSICIAN TODAY AND SIGNS OR SYMPTOMS TO REPORT TO CLINIC OR PRIMARY PHYSICIAN: Exam and findings as discussed by Dr. Zigmund Daniel.  Results of PET scan discussed.  Would like to start you on maintenance therapy with Avastin in about 1 month.  Will need teaching with Rosana Berger.  She will contact you with day and time for teaching.  Will not  give you aranesp today.  Your hemoglobin is 10.1 today.  MEDICATIONS PRESCRIBED:  Oxycontin 20 mg - take every 12 hours routinely Can take Oxycodone for break throurgh pain if needed. Continue your Cymbalta  INSTRUCTIONS/FOLLOW-UP: Follow-up in 4 weeks.  Thank you for choosing [facility] to provide your oncology and hematology care.  To afford each patient quality time with our providers, please arrive at least 15 minutes before your scheduled appointment time.  With your help, our goal is to use those 15 minutes to complete the necessary work-up to ensure our physicians have the information they need to help with your evaluation and healthcare recommendations.    Effective January 1st, 2014, we ask that you re-schedule your appointment with our physicians should you arrive 10 or more minutes late for your appointment.  We strive to give you quality time with our providers, and arriving late affects you and other patients whose appointments are after yours.    Again, thank you for choosing [facility].  Our hope is that these requests will decrease the amount of time that you wait before being seen by our physicians.       _____________________________________________________________  Should you have questions after your visit to [facility], please contact our office at [phone] between the hours of 8:30 a.m. and 5:00 p.m.  Voicemails  left after 4:30 p.m. will not be returned until the following business day.  For prescription refill requests, have your pharmacy contact our office with your prescription refill request.     Bevacizumab injection What is this medicine? BEVACIZUMAB (be va SIZ yoo mab) is a chemotherapy drug. It targets a protein found in many cancer cell types, and halts cancer growth. This drug treats many cancers including non-small cell lung cancer, and colon or rectal cancer. It is usually given with other chemotherapy drugs. This medicine may be used for other purposes; ask your health care provider or pharmacist if you have questions. COMMON BRAND NAME(S): Avastin What should I tell my health care provider before I take this medicine? They need to know if you have any of these conditions: -blood clots -heart disease, including heart failure, heart attack, or chest pain (angina) -high blood pressure -infection (especially a virus infection such as chickenpox, cold sores, or herpes) -kidney disease -lung disease -prior chemotherapy with doxorubicin, daunorubicin, epirubicin, or other anthracycline type chemotherapy agents -recent or ongoing radiation therapy -recent surgery -stroke -an unusual or allergic reaction to bevacizumab, hamster proteins, mouse proteins, other medicines, foods, dyes, or preservatives -pregnant or trying to get pregnant -breast-feeding How should I use this medicine? This medicine is for infusion into a vein. It is given by a health care professional in a hospital or clinic setting. Talk to your pediatrician regarding the use of this medicine in children. Special care may be needed. Overdosage: If you think you have taken too much of this medicine contact a poison control  center or emergency room at once. NOTE: This medicine is only for you. Do not share this medicine with others. What if I miss a dose? It is important not to miss your dose. Call your doctor or health care  professional if you are unable to keep an appointment. What may interact with this medicine? Interactions are not expected. This list may not describe all possible interactions. Give your health care provider a list of all the medicines, herbs, non-prescription drugs, or dietary supplements you use. Also tell them if you smoke, drink alcohol, or use illegal drugs. Some items may interact with your medicine. What should I watch for while using this medicine? Your condition will be monitored carefully while you are receiving this medicine. You will need important blood work and urine testing done while you are taking this medicine. During your treatment, let your health care professional know if you have any unusual symptoms, such as difficulty breathing. This medicine may rarely cause 'gastrointestinal perforation' (holes in the stomach, intestines or colon), a serious side effect requiring surgery to repair. This medicine should be started at least 28 days following major surgery and the site of the surgery should be totally healed. Check with your doctor before scheduling dental work or surgery while you are receiving this treatment. Talk to your doctor if you have recently had surgery or if you have a wound that has not healed. Do not become pregnant while taking this medicine. Women should inform their doctor if they wish to become pregnant or think they might be pregnant. There is a potential for serious side effects to an unborn child. Talk to your health care professional or pharmacist for more information. Do not breast-feed an infant while taking this medicine. This medicine has caused ovarian failure in some women. This medicine may interfere with the ability to have a child. You should talk to your doctor or health care professional if you are concerned about your fertility. What side effects may I notice from receiving this medicine? Side effects that you should report to your doctor or health  care professional as soon as possible: -allergic reactions like skin rash, itching or hives, swelling of the face, lips, or tongue -signs of infection - fever or chills, cough, sore throat, pain or trouble passing urine -signs of decreased platelets or bleeding - bruising, pinpoint red spots on the skin, black, tarry stools, nosebleeds, blood in the urine -breathing problems -changes in vision -chest pain -confusion -jaw pain, especially after dental work -mouth sores -seizures -severe abdominal pain -severe headache -sudden numbness or weakness of the face, arm or leg -swelling of legs or ankles -symptoms of a stroke: change in mental awareness, inability to talk or move one side of the body (especially in patients with lung cancer) -trouble passing urine or change in the amount of urine -trouble speaking or understanding -trouble walking, dizziness, loss of balance or coordination Side effects that usually do not require medical attention (report to your doctor or health care professional if they continue or are bothersome): -constipation -diarrhea -dry skin -headache -loss of appetite -nausea, vomiting This list may not describe all possible side effects. Call your doctor for medical advice about side effects. You may report side effects to FDA at 1-800-FDA-1088. Where should I keep my medicine? This drug is given in a hospital or clinic and will not be stored at home. NOTE: This sheet is a summary. It may not cover all possible information. If you have questions about this  medicine, talk to your doctor, pharmacist, or health care provider.  2014, [employer]. (2010-02-20 16:25:37)

## 2013-04-03 ENCOUNTER — Other Ambulatory Visit (HOSPITAL_COMMUNITY): Payer: Self-pay | Admitting: *Deleted

## 2013-04-03 ENCOUNTER — Telehealth (HOSPITAL_COMMUNITY): Payer: Self-pay | Admitting: Hematology and Oncology

## 2013-04-03 ENCOUNTER — Other Ambulatory Visit (HOSPITAL_COMMUNITY): Payer: Self-pay | Admitting: Hematology and Oncology

## 2013-04-03 DIAGNOSIS — C801 Malignant (primary) neoplasm, unspecified: Secondary | ICD-10-CM

## 2013-04-03 MED ORDER — MORPHINE SULFATE ER 30 MG PO TBCR: 30.0000 mg | EXTENDED_RELEASE_TABLET | Freq: Two times a day (BID) | ORAL | Status: DC

## 2013-04-04 ENCOUNTER — Encounter (HOSPITAL_COMMUNITY): Payer: Self-pay | Admitting: Oncology

## 2013-04-04 ENCOUNTER — Encounter (HOSPITAL_BASED_OUTPATIENT_CLINIC_OR_DEPARTMENT_OTHER): Payer: PRIVATE HEALTH INSURANCE | Admitting: Oncology

## 2013-04-04 ENCOUNTER — Ambulatory Visit (HOSPITAL_COMMUNITY)
Admission: RE | Admit: 2013-04-04 | Discharge: 2013-04-04 | Disposition: A | Discharge status: Home / Self Care | Payer: PRIVATE HEALTH INSURANCE | Source: Ambulatory Visit | Attending: Oncology | Admitting: Oncology

## 2013-04-04 VITALS — BP 93/57 | HR 98 | Temp 98.5°F | Resp 16 | Wt 155.2 lb

## 2013-04-04 DIAGNOSIS — I776 Arteritis, unspecified: Secondary | ICD-10-CM

## 2013-04-04 DIAGNOSIS — C801 Malignant (primary) neoplasm, unspecified: Secondary | ICD-10-CM

## 2013-04-04 DIAGNOSIS — M7989 Other specified soft tissue disorders: Secondary | ICD-10-CM | POA: Insufficient documentation

## 2013-04-04 DIAGNOSIS — L539 Erythematous condition, unspecified: Secondary | ICD-10-CM

## 2013-04-04 DIAGNOSIS — I83893 Varicose veins of bilateral lower extremities with other complications: Secondary | ICD-10-CM | POA: Insufficient documentation

## 2013-04-04 DIAGNOSIS — I809 Phlebitis and thrombophlebitis of unspecified site: Secondary | ICD-10-CM | POA: Insufficient documentation

## 2013-04-04 DIAGNOSIS — R609 Edema, unspecified: Secondary | ICD-10-CM

## 2013-04-04 DIAGNOSIS — R600 Localized edema: Secondary | ICD-10-CM

## 2013-04-04 DIAGNOSIS — L0390 Cellulitis, unspecified: Secondary | ICD-10-CM

## 2013-04-04 DIAGNOSIS — M79609 Pain in unspecified limb: Secondary | ICD-10-CM | POA: Insufficient documentation

## 2013-04-04 MED ORDER — SODIUM CHLORIDE 0.9 % IV SOLN
Freq: Once | INTRAVENOUS | Status: AC
  Administered 2013-04-04: 12:00:00 via INTRAVENOUS

## 2013-04-04 MED ORDER — MORPHINE SULFATE ER 30 MG PO TBCR: 30.0000 mg | EXTENDED_RELEASE_TABLET | Freq: Two times a day (BID) | ORAL | Status: DC

## 2013-04-04 MED ORDER — OXYCODONE HCL ER 20 MG PO T12A: 20.0000 mg | EXTENDED_RELEASE_TABLET | Freq: Two times a day (BID) | ORAL | Status: DC

## 2013-04-04 MED ORDER — DEXTROSE 5 % IV SOLN
2.0000 g | Freq: Once | INTRAVENOUS | Status: AC
  Administered 2013-04-04: 2 g via INTRAVENOUS
  Filled 2013-04-04: qty 2

## 2013-04-04 MED ORDER — METHYLPREDNISOLONE (PAK) 4 MG PO TABS: ORAL_TABLET | ORAL | Status: DC

## 2013-04-04 MED ORDER — HEPARIN SOD (PORK) LOCK FLUSH 100 UNIT/ML IV SOLN
500.0000 [IU] | Freq: Once | INTRAVENOUS | Status: AC
  Administered 2013-04-04: 500 [IU] via INTRAVENOUS
  Filled 2013-04-04: qty 5

## 2013-04-04 MED ORDER — CEPHALEXIN 500 MG PO CAPS: 500.0000 mg | ORAL_CAPSULE | Freq: Four times a day (QID) | ORAL | Status: DC

## 2013-04-04 NOTE — Progress Notes (Signed)
Tolerated infusion well. 

## 2013-04-04 NOTE — Progress Notes (Signed)
Erika Moreno is seen as a work-in today.  She notes B/L LE swelling with pain. She reports that it started a few days ago but has progressively become more tender.  She reports that the R >> L.  She admits it is tender to palpation.  She denies any fevers or chills.  She denies any rash or trauma.  Additionally, she is having some difficulty with her long-acting pain medication, OxyContin.  She reports that she received a letter from [employer] reporting that OxyContin has been approved, but she called her insurance company this AM and they report that it is not approved.  As a result of this confusion, I have written 2 Rxs since she lives in [place].  One is for OxyContin 20 mg every 12 hours, the other is for MS Contin 30 mg every 12 hours. If OxyContin cannot be filled, she has the ability to get the MS contin filled.   Exam: BP 93/57  Pulse 98  Temp(Src) 98.5 F (36.9 C) (Oral)  Resp 16  Wt 155 lb 3.2 oz (70.398 kg) Gen: Pleasant, NAD HEENT: Normocephalic, atraumatic Skin: Warm and dry Extremities: B/L LE erythema from mid-tibia inferiorly with 1-2+ pitting edema, woody infiltration of calf.  The erythema is blanchable.  No specific site of origin of the erythema.  Tender to palpation.  No popliteal tenderness.  Positive Homan's sign.  Warm to the touch.  R> L. Neuro: A and O x 3.  Assessment: 1. B/L LE edema, with erythema, heat and tenderness.  DVT versus Cellulitis versus vasculitis.  Plan: 1. Will give 2 g of Rocephin IV now 2. [place] of B/L LE today  A. If [place] is +, will anticoagulate appropriately  B. If [place] is -, will treat for cellulitis with Keflex QID and Medrol dose Pak for vasculitis. 3. Rx for OxyContin 20 mg #68 and Rx for MS Contin 30 mg #68 due to insurance issues. 4. Return as scheduled.   Patient and plan discussed with Dr. Alla German and he is in agreement with the aforementioned.   Erika Moreno   [place] is negative: CLINICAL DATA: Superficial thrombophlebitis. Pain,  swelling. Varicose veins. EXAM: BILATERAL LOWER EXTREMITY VENOUS DOPPLER ULTRASOUND TECHNIQUE: Gray-scale sonography with compression, as well as color and duplex ultrasound, were performed to evaluate the deep venous system from the level of the common femoral vein through the popliteal and proximal calf veins. COMPARISON: 02/11/2013 FINDINGS: Normal compressibility of the common femoral, superficial femoral, and popliteal veins, as well as the proximal calf veins. No filling defects to suggest DVT on grayscale or color Doppler imaging. Doppler waveforms show normal direction of venous flow, normal respiratory phasicity and response to augmentation. Subcutaneous edema in the right calf. Varicose veins in bilateral calves are noted. IMPRESSION: No evidence of lower extremity deep vein thrombosis. Electronically Signed By: Oley Balm M.D. On: 04/04/2013 14:05  I will order Keflex QID and Medrol Dose-Pak for possible cellulitis +/- vasculitis.  Caeleigh Prohaska

## 2013-04-04 NOTE — Patient Instructions (Signed)
[facility] Discharge Instructions  RECOMMENDATIONS MADE BY THE CONSULTANT AND ANY TEST RESULTS WILL BE SENT TO YOUR REFERRING PHYSICIAN.  EXAM FINDINGS BY THE PHYSICIAN TODAY AND SIGNS OR SYMPTOMS TO REPORT TO CLINIC OR PRIMARY PHYSICIAN: Exam and findings as discussed by Dellis Anes, PA- C Will do ultrasound of your legs to make sure you don't have any clots.   MEDICATIONS PRESCRIBED:  Antibiotic - take as directed Steroid dose pack - take as directed.  INSTRUCTIONS/FOLLOW-UP: Return as scheduled.  Thank you for choosing [facility] to provide your oncology and hematology care.  To afford each patient quality time with our providers, please arrive at least 15 minutes before your scheduled appointment time.  With your help, our goal is to use those 15 minutes to complete the necessary work-up to ensure our physicians have the information they need to help with your evaluation and healthcare recommendations.    Effective January 1st, 2014, we ask that you re-schedule your appointment with our physicians should you arrive 10 or more minutes late for your appointment.  We strive to give you quality time with our providers, and arriving late affects you and other patients whose appointments are after yours.    Again, thank you for choosing [facility].  Our hope is that these requests will decrease the amount of time that you wait before being seen by our physicians.       _____________________________________________________________  Should you have questions after your visit to [facility], please contact our office at [phone] between the hours of 8:30 a.m. and 5:00 p.m.  Voicemails left after 4:30 p.m. will not be returned until the following business day.  For prescription refill requests, have your pharmacy contact our office with your prescription refill request.

## 2013-04-06 ENCOUNTER — Inpatient Hospital Stay (HOSPITAL_COMMUNITY): Payer: PRIVATE HEALTH INSURANCE

## 2013-04-16 ENCOUNTER — Encounter (HOSPITAL_BASED_OUTPATIENT_CLINIC_OR_DEPARTMENT_OTHER): Payer: PRIVATE HEALTH INSURANCE

## 2013-04-16 ENCOUNTER — Encounter (HOSPITAL_COMMUNITY): Payer: Self-pay

## 2013-04-16 ENCOUNTER — Encounter (HOSPITAL_COMMUNITY): Payer: PRIVATE HEALTH INSURANCE

## 2013-04-16 ENCOUNTER — Encounter (HOSPITAL_COMMUNITY): Payer: PRIVATE HEALTH INSURANCE | Attending: Internal Medicine

## 2013-04-16 VITALS — BP 110/69 | HR 80 | Temp 98.2°F | Resp 20

## 2013-04-16 DIAGNOSIS — C786 Secondary malignant neoplasm of retroperitoneum and peritoneum: Secondary | ICD-10-CM

## 2013-04-16 DIAGNOSIS — C801 Malignant (primary) neoplasm, unspecified: Secondary | ICD-10-CM

## 2013-04-16 DIAGNOSIS — I776 Arteritis, unspecified: Secondary | ICD-10-CM

## 2013-04-16 DIAGNOSIS — L299 Pruritus, unspecified: Secondary | ICD-10-CM | POA: Insufficient documentation

## 2013-04-16 DIAGNOSIS — R609 Edema, unspecified: Secondary | ICD-10-CM

## 2013-04-16 DIAGNOSIS — L0390 Cellulitis, unspecified: Secondary | ICD-10-CM

## 2013-04-16 DIAGNOSIS — L539 Erythematous condition, unspecified: Secondary | ICD-10-CM

## 2013-04-16 DIAGNOSIS — G609 Hereditary and idiopathic neuropathy, unspecified: Secondary | ICD-10-CM

## 2013-04-16 DIAGNOSIS — T451X5A Adverse effect of antineoplastic and immunosuppressive drugs, initial encounter: Secondary | ICD-10-CM | POA: Insufficient documentation

## 2013-04-16 DIAGNOSIS — Z5112 Encounter for antineoplastic immunotherapy: Secondary | ICD-10-CM

## 2013-04-16 DIAGNOSIS — D6481 Anemia due to antineoplastic chemotherapy: Secondary | ICD-10-CM | POA: Insufficient documentation

## 2013-04-16 LAB — COMPREHENSIVE METABOLIC PANEL
ALT: 8 U/L (ref 0–35)
AST: 14 U/L (ref 0–37)
Albumin: 3.3 g/dL — ABNORMAL LOW (ref 3.5–5.2)
Alkaline Phosphatase: 79 U/L (ref 39–117)
BILIRUBIN TOTAL: 0.3 mg/dL (ref 0.3–1.2)
BUN: 25 mg/dL — ABNORMAL HIGH (ref 6–23)
CHLORIDE: 100 meq/L (ref 96–112)
CO2: 28 meq/L (ref 19–32)
CREATININE: 1.02 mg/dL (ref 0.50–1.10)
Calcium: 9 mg/dL (ref 8.4–10.5)
GFR calc Af Amer: 64 mL/min — ABNORMAL LOW (ref >90)
GFR, EST NON AFRICAN AMERICAN: 55 mL/min — AB (ref >90)
Glucose, Bld: 99 mg/dL (ref 70–99)
POTASSIUM: 4.1 meq/L (ref 3.7–5.3)
Sodium: 139 mEq/L (ref 137–147)
Total Protein: 6.8 g/dL (ref 6.0–8.3)

## 2013-04-16 LAB — CBC WITH DIFFERENTIAL/PLATELET
BASOS PCT: 1 % (ref 0–1)
Basophils Absolute: 0 10*3/uL (ref 0.0–0.1)
Eosinophils Absolute: 0.1 10*3/uL (ref 0.0–0.7)
Eosinophils Relative: 3 % (ref 0–5)
HEMATOCRIT: 26.1 % — AB (ref 36.0–46.0)
Hemoglobin: 9.3 g/dL — ABNORMAL LOW (ref 12.0–15.0)
Lymphocytes Relative: 32 % (ref 12–46)
Lymphs Abs: 1.2 10*3/uL (ref 0.7–4.0)
MCH: 33.7 pg (ref 26.0–34.0)
MCHC: 35.6 g/dL (ref 30.0–36.0)
MCV: 94.6 fL (ref 78.0–100.0)
MONO ABS: 0.3 10*3/uL (ref 0.1–1.0)
Monocytes Relative: 9 % (ref 3–12)
Neutro Abs: 2 10*3/uL (ref 1.7–7.7)
Neutrophils Relative %: 56 % (ref 43–77)
Platelets: 125 10*3/uL — ABNORMAL LOW (ref 150–400)
RBC: 2.76 MIL/uL — ABNORMAL LOW (ref 3.87–5.11)
RDW: 16.8 % — ABNORMAL HIGH (ref 11.5–15.5)
WBC: 3.6 10*3/uL — ABNORMAL LOW (ref 4.0–10.5)

## 2013-04-16 LAB — URINALYSIS, DIPSTICK ONLY
Bilirubin Urine: NEGATIVE
Glucose, UA: NEGATIVE mg/dL
Hgb urine dipstick: NEGATIVE
Ketones, ur: NEGATIVE mg/dL
NITRITE: NEGATIVE
PROTEIN: NEGATIVE mg/dL
Specific Gravity, Urine: 1.01 (ref 1.005–1.030)
Urobilinogen, UA: 0.2 mg/dL (ref 0.0–1.0)
pH: 7.5 (ref 5.0–8.0)

## 2013-04-16 MED ORDER — SODIUM CHLORIDE 0.9 % IJ SOLN: 10.0000 mL | INTRAMUSCULAR | Status: DC | PRN

## 2013-04-16 MED ORDER — SODIUM CHLORIDE 0.9 % IV SOLN
Freq: Once | INTRAVENOUS | Status: AC
  Administered 2013-04-16: 12:00:00 via INTRAVENOUS

## 2013-04-16 MED ORDER — BEVACIZUMAB CHEMO INJECTION 400 MG/16ML
15.0000 mg/kg | Freq: Once | INTRAVENOUS | Status: AC
  Administered 2013-04-16: 1050 mg via INTRAVENOUS
  Filled 2013-04-16: qty 42

## 2013-04-16 MED ORDER — HEPARIN SOD (PORK) LOCK FLUSH 100 UNIT/ML IV SOLN
500.0000 [IU] | Freq: Once | INTRAVENOUS | Status: AC | PRN
  Administered 2013-04-16: 500 [IU]

## 2013-04-16 MED ORDER — HEPARIN SOD (PORK) LOCK FLUSH 100 UNIT/ML IV SOLN
INTRAVENOUS | Status: AC
  Filled 2013-04-16: qty 5

## 2013-04-16 NOTE — Progress Notes (Signed)
Tolerated Avastin well. 

## 2013-04-16 NOTE — Progress Notes (Signed)
[facility]  OFFICE PROGRESS NOTE  [name], MD [place] [id number]  DIAGNOSIS: Cancer of unknown origin  Leg erythema  Cellulitis  Vasculitis  Chief Complaint  Patient presents with  . 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 No definitive debulking surgery was performed. Patient was seen last week with lower extremity redness with negative Doppler study and treatment with Medrol Dosepak and Keflex following Rocephin intravenously.  INTERVAL HISTORY: Erika Moreno 68 y.o. female returns for initiation of maintenance Avastin therapy for peritoneal carcinomatosis, status post 4 cycles of carboplatin and Taxol with complete response on PET scan performed on 03/22/2013 Lower extremity swelling and redness persist along with pruritus. She is taking Keflex and did receive intravenous Rocephin one week ago. She is finishing a Medrol Dosepak. She also takes furosemide 20 mg daily. She denies a sore mouth, fever, night sweats, abdominal distention, PND, orthopnea, palpitations, upper extremity paresthesias or worsening, other skin rash, headache, or seizures.  MEDICAL HISTORY: Past Medical History  Diagnosis Date  . 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 Cervical cancer 1984  . Myocardial infarction 20 yrs ago  . Coronary artery disease   . Anxiety   . Chronic back pain   . Depression   . GERD (gastroesophageal reflux disease)   . Hypertension   . Hypothyroidism   . Anemia   . Vitamin B 12 deficiency   . Vitamin D deficiency disease     INTERIM HISTORY: has Cancer of unknown origin; Anemia due to chemotherapy; Acute sinusitis; Normocytic anemia; Antineoplastic chemotherapy induced pancytopenia; UTI (lower urinary tract  infection); Dyspnea on exertion; Neuropathy due to chemotherapeutic drug; and Thrombophlebitis leg superficial on her problem list.    ALLERGIES:  is allergic to morphine and related and sulfa antibiotics.  MEDICATIONS: has a current medication list which includes the following prescription(s): alprazolam, aspirin ec, atorvastatin, cephalexin, clopidogrel, cyclobenzaprine, dexlansoprazole, duloxetine, furosemide, isosorbide mononitrate, lidocaine-prilocaine, lisinopril-hydrochlorothiazide, methylprednisolone, metoclopramide, morphine, nitroglycerin, ondansetron, oxycodone, oxycodone hcl, oxymetazoline, potassium chloride sa, sodium chloride, venlafaxine, and oxycodone.  SURGICAL HISTORY:  Past Surgical History  Procedure Laterality Date  . Heart bypass      triple bypass with stents  . Coronary artery bypass graft      3 vessels  . Cardiac catheterization      stents x3  . Abdominal hysterectomy    . Portacath placement Right 12/06/2012    Procedure: INSERTION PORT-A-CATH;  Surgeon: Donato Heinz, MD;  Location: [facility];  Service: General;  Laterality: Right;    FAMILY HISTORY: family history includes Heart attack in her father and mother.  SOCIAL HISTORY:  reports that she quit smoking about 24 years ago. Her smoking use included Cigarettes. She has a 44 pack-year smoking history. She has never used smokeless tobacco. She reports that she does not drink alcohol or use illicit drugs.  REVIEW OF SYSTEMS:  Other than that discussed above is noncontributory.  PHYSICAL EXAMINATION: ECOG PERFORMANCE STATUS: 1 - Symptomatic but completely ambulatory  There were no vitals taken for this visit.  GENERAL:alert, no distress and comfortable SKIN: skin color, texture, turgor are normal, no rashes or significant lesions EYES: PERLA; Conjunctiva are pink and non-injected, sclera clear OROPHARYNX:no exudate, no erythema on lips, buccal mucosa, or tongue. NECK:  supple, thyroid normal size,  non-tender, without nodularity. No masses CHEST: LifePort in place. No breast masses. LYMPH:  no palpable lymphadenopathy in the cervical, axillary or inguinal LUNGS: clear to auscultation and percussion with normal breathing effort HEART: regular rate & rhythm and no murmurs. ABDOMEN:abdomen soft, non-tender and normal bowel sounds. No free fluid wave or shifting dullness. MUSCULOSKELETAL:no cyanosis of digits and no clubbing. Range of motion normal. Bilateral lower 70 swelling below the knees with slight erythema and warmth. Exfoliative changes are noted. Negative Homans sign bilaterally. NEURO: alert & oriented x 3 with fluent speech, no focal motor/sensory deficits   LABORATORY DATA: Appointment on 04/16/2013  Component Date Value Range Status  . WBC 04/16/2013 3.6* 4.0 - 10.5 K/uL Final  . RBC 04/16/2013 2.76* 3.87 - 5.11 MIL/uL Final  . Hemoglobin 04/16/2013 9.3* 12.0 - 15.0 g/dL Final  . HCT 04/16/2013 26.1* 36.0 - 46.0 % Final  . MCV 04/16/2013 94.6  78.0 - 100.0 fL Final  . MCH 04/16/2013 33.7  26.0 - 34.0 pg Final  . MCHC 04/16/2013 35.6  30.0 - 36.0 g/dL Final  . RDW 04/16/2013 16.8* 11.5 - 15.5 % Final  . Platelets 04/16/2013 125* 150 - 400 K/uL Final  . Neutrophils Relative % 04/16/2013 56  43 - 77 % Final  . Neutro Abs 04/16/2013 2.0  1.7 - 7.7 K/uL Final  . Lymphocytes Relative 04/16/2013 32  12 - 46 % Final  . Lymphs Abs 04/16/2013 1.2  0.7 - 4.0 K/uL Final  . Monocytes Relative 04/16/2013 9  3 - 12 % Final  . Monocytes Absolute 04/16/2013 0.3  0.1 - 1.0 K/uL Final  . Eosinophils Relative 04/16/2013 3  0 - 5 % Final  . Eosinophils Absolute 04/16/2013 0.1  0.0 - 0.7 K/uL Final  . Basophils Relative 04/16/2013 1  0 - 1 % Final  . Basophils Absolute 04/16/2013 0.0  0.0 - 0.1 K/uL Final  . Sodium 04/16/2013 139  137 - 147 mEq/L Final  . Potassium 04/16/2013 4.1  3.7 - 5.3 mEq/L Final  . Chloride 04/16/2013 100  96 - 112 mEq/L Final  . CO2 04/16/2013 28  19 - 32 mEq/L  Final  . Glucose, Bld 04/16/2013 99  70 - 99 mg/dL Final  . BUN 04/16/2013 25* 6 - 23 mg/dL Final  . Creatinine, Ser 04/16/2013 1.02  0.50 - 1.10 mg/dL Final  . Calcium 04/16/2013 9.0  8.4 - 10.5 mg/dL Final  . Total Protein 04/16/2013 6.8  6.0 - 8.3 g/dL Final  . Albumin 04/16/2013 3.3* 3.5 - 5.2 g/dL Final  . AST 04/16/2013 14  0 - 37 U/L Final  . ALT 04/16/2013 8  0 - 35 U/L Final  . Alkaline Phosphatase 04/16/2013 79  39 - 117 U/L Final  . Total Bilirubin 04/16/2013 0.3  0.3 - 1.2 mg/dL Final  . GFR calc non Af Amer 04/16/2013 55* >90 mL/min Final  . GFR calc Af Amer 04/16/2013 64* >90 mL/min Final   Comment: (NOTE)                          The eGFR has been calculated using the CKD EPI equation.                          This calculation has not been validated in all clinical situations.  eGFR's persistently <90 mL/min signify possible Chronic Kidney                          Disease.  [name] Specific Gravity, Urine 04/16/2013 1.010  1.005 - 1.030 Final  . pH 04/16/2013 7.5  5.0 - 8.0 Final  . Glucose, UA 04/16/2013 NEGATIVE  NEGATIVE mg/dL Final  . Hgb urine dipstick 04/16/2013 NEGATIVE  NEGATIVE Final  . Bilirubin Urine 04/16/2013 NEGATIVE  NEGATIVE Final  . Ketones, ur 04/16/2013 NEGATIVE  NEGATIVE mg/dL Final  . Protein, ur 04/16/2013 NEGATIVE  NEGATIVE mg/dL Final  . Urobilinogen, UA 04/16/2013 0.2  0.0 - 1.0 mg/dL Final  . Nitrite 04/16/2013 NEGATIVE  NEGATIVE Final  . Leukocytes, UA 04/16/2013 SMALL* NEGATIVE Final  Infusion on 03/27/2013  Component Date Value Range Status  . WBC 03/27/2013 4.3  4.0 - 10.5 K/uL Final  . RBC 03/27/2013 3.27* 3.87 - 5.11 MIL/uL Final  . Hemoglobin 03/27/2013 10.1* 12.0 - 15.0 g/dL Final  . HCT 03/27/2013 30.7* 36.0 - 46.0 % Final  . MCV 03/27/2013 93.9  78.0 - 100.0 fL Final  . MCH 03/27/2013 30.9  26.0 - 34.0 pg Final  . MCHC 03/27/2013 32.9  30.0 - 36.0 g/dL Final  . RDW 03/27/2013 17.1* 11.5 - 15.5 % Final  .  Platelets 03/27/2013 74* 150 - 400 K/uL Final   CONSISTENT WITH PREVIOUS RESULT  . Neutrophils Relative % 03/27/2013 52  43 - 77 % Final  . Neutro Abs 03/27/2013 2.2  1.7 - 7.7 K/uL Final  . Lymphocytes Relative 03/27/2013 36  12 - 46 % Final  . Lymphs Abs 03/27/2013 1.5  0.7 - 4.0 K/uL Final  . Monocytes Relative 03/27/2013 9  3 - 12 % Final  . Monocytes Absolute 03/27/2013 0.4  0.1 - 1.0 K/uL Final  . Eosinophils Relative 03/27/2013 4  0 - 5 % Final  . Eosinophils Absolute 03/27/2013 0.2  0.0 - 0.7 K/uL Final  . Basophils Relative 03/27/2013 0  0 - 1 % Final  . Basophils Absolute 03/27/2013 0.0  0.0 - 0.1 K/uL Final  . Sodium 03/27/2013 136  135 - 145 mEq/L Final  . Potassium 03/27/2013 3.7  3.5 - 5.1 mEq/L Final  . Chloride 03/27/2013 98  96 - 112 mEq/L Final  . CO2 03/27/2013 28  19 - 32 mEq/L Final  . Glucose, Bld 03/27/2013 94  70 - 99 mg/dL Final  . BUN 03/27/2013 14  6 - 23 mg/dL Final  . Creatinine, Ser 03/27/2013 0.58  0.50 - 1.10 mg/dL Final  . Calcium 03/27/2013 9.7  8.4 - 10.5 mg/dL Final  . Total Protein 03/27/2013 7.8  6.0 - 8.3 g/dL Final  . Albumin 03/27/2013 3.9  3.5 - 5.2 g/dL Final  . AST 03/27/2013 18  0 - 37 U/L Final  . ALT 03/27/2013 10  0 - 35 U/L Final  . Alkaline Phosphatase 03/27/2013 75  39 - 117 U/L Final  . Total Bilirubin 03/27/2013 0.4  0.3 - 1.2 mg/dL Final  . GFR calc non Af Amer 03/27/2013 >90  >90 mL/min Final  . GFR calc Af Amer 03/27/2013 >90  >90 mL/min Final   Comment: (NOTE)                          The eGFR has been calculated using the CKD EPI equation.  This calculation has not been validated in all clinical situations.                          eGFR's persistently <90 mL/min signify possible Chronic Kidney                          Disease.  Hospital Outpatient Visit on 03/22/2013  Component Date Value Range Status  . Glucose-Capillary 03/22/2013 93  70 - 99 mg/dL Final    PATHOLOGY: No new  pathology. Urinalysis    Component Value Date/Time   COLORURINE YELLOW 02/10/2013 1650   APPEARANCEUR CLOUDY* 02/10/2013 1650   LABSPEC 1.010 04/16/2013 1002   PHURINE 7.5 04/16/2013 1002   GLUCOSEU NEGATIVE 04/16/2013 1002   HGBUR NEGATIVE 04/16/2013 1002   BILIRUBINUR NEGATIVE 04/16/2013 1002   KETONESUR NEGATIVE 04/16/2013 1002   PROTEINUR NEGATIVE 04/16/2013 1002   UROBILINOGEN 0.2 04/16/2013 1002   NITRITE NEGATIVE 04/16/2013 1002   LEUKOCYTESUR SMALL* 04/16/2013 1002    RADIOGRAPHIC STUDIES: Nm Pet Image Restag (ps) Skull Base To Thigh  03/22/2013   CLINICAL DATA:  Subsequent treatment strategy for metastatic adenocarcinoma of unknown primary. Abdominal disease. [name]  EXAM: NUCLEAR MEDICINE PET SKULL BASE TO THIGH  FASTING BLOOD GLUCOSE:  Value: $Remov'93mg'mVIxnh$ /dl  TECHNIQUE: 19.0 mCi F-18 FDG was injected intravenously. CT data was obtained and used for attenuation correction and anatomic localization only. (This was not acquired as a diagnostic CT examination.) Additional exam technical data entered on technologist worksheet.  COMPARISON:  NM PET IMAGE INITIAL (PI) SKULL BASE TO THIGH dated 10/03/2012; CT ABD - PELV W/ CM dated 01/12/2013  FINDINGS: NECK  No hypermetabolic lymph nodes in the neck.  CHEST  Interval resolution of the hypermetabolic mediastinal adenopathy. 15 mm subcarinal lymph node remains with no metabolic activity above background blood pool activity. Mild metabolic activity associated with axillary lymph nodes (image 73 and 75) on the right left respectively is below blood pool activity. No suspicious pulmonary nodules. There is perihilar diffuse ground-glass opacities without nodularity. This finding is new from CT of 01/12/2013  ABDOMEN/PELVIS  Interval resolution of the hypermetabolic periaortic adenopathy. There are small sub cm left periaortic lymph nodes which have metabolic activity less than blood pool activity. The activity associated with the bilobed left adnexal lesions is also  decreased in the interval. No significant metabolic activity remains. Large bilobed lesion in the left adnexa has decreased slightly in volume measuring measuring 6.9 x 3.7 cm compared to 8.7 x 5.3 cm on prior.  SKELETON  No focal hypermetabolic activity to suggest skeletal metastasis.  IMPRESSION: 1. No residual significant metabolic activity within the small lymph nodes of the axilla, mediastinum, and retroperitoneum to suggest active metastatic disease. Minimal residual activity is likely reactive. 2. No evidence disease progression or new disease. 3. Slight decrease in volume of bilobed lesion associated with the left adnexa. 4. New diffuse perihilar ground-glass pulmonary opacities. Differential includes pulmonary edema versus drug reaction.   Electronically Signed   By: Suzy Bouchard M.D.   On: 03/22/2013 14:23   US Venous Img Lower Bilateral  04/04/2013   CLINICAL DATA:  Superficial thrombophlebitis. Pain, swelling. Varicose veins.  EXAM: BILATERAL LOWER EXTREMITY VENOUS DOPPLER ULTRASOUND  TECHNIQUE: Gray-scale sonography with compression, as well as color and duplex ultrasound, were performed to evaluate the deep venous system from the level of the common femoral vein through the popliteal and proximal calf veins.  COMPARISON:  02/11/2013  FINDINGS: Normal compressibility of the common femoral, superficial femoral, and popliteal veins, as well as the proximal calf veins. No filling defects to suggest DVT on grayscale or color Doppler imaging. Doppler waveforms show normal direction of venous flow, normal respiratory phasicity and response to augmentation.  Subcutaneous edema in the right calf. Varicose veins in bilateral calves are noted.  IMPRESSION: No evidence of  lower extremity deep vein thrombosis.   Electronically Signed   By: Arne Cleveland M.D.   On: 04/04/2013 14:05    ASSESSMENT:  #1. Cellulitis versus vasculitis lower extremities bilaterally with edema. #2. Unknown primary behaving  as peritoneal carcinomatosis, status post 4 cycles of carboplatin and Taxol with peripheral neuropathy, for maintenance Avastin to begin today. #3. Anemia secondary chemotherapy. #4. Peripheral neuropathy, on Cymbalta 60 mg at bedtime along with MS Contin 30 mg every 12 hours with oxycodone 10 mg up to every 4 hours as needed for breakthrough pain.   PLAN:  #1. Increase furosemide 40 mg daily and complete prescription for Keflex and Medrol Dosepak. Told to purchase generic loratadine 10 mg daily to help with itching and continue topical cortisone cream. #2. Intravenous Avastin today. #3. Followup in one week.   All questions were answered. The patient knows to call the clinic with any problems, questions or concerns. We can certainly see the patient much sooner if necessary.   I spent 25 minutes counseling the patient face to face. The total time spent in the appointment was 30 minutes.    Doroteo Bradford, MD 04/16/2013 11:23 AM

## 2013-04-16 NOTE — Patient Instructions (Addendum)
.  [facility] Discharge Instructions  RECOMMENDATIONS MADE BY THE CONSULTANT AND ANY TEST RESULTS WILL BE SENT TO YOUR REFERRING PHYSICIAN.  Increase Furosemide to 40 mg daily. Finish the Medrol dose pack. Finish the antibiotic Cephalexin for the rash on your legs. You may continue to use hydrocortisone cream to legs as needed. You may use over the counter Loratadine 10 mg daily. MD appointment in 1 week. Avastin every 21 days.  Thank you for choosing [facility] to provide your oncology and hematology care.  To afford each patient quality time with our providers, please arrive at least 15 minutes before your scheduled appointment time.  With your help, our goal is to use those 15 minutes to complete the necessary work-up to ensure our physicians have the information they need to help with your evaluation and healthcare recommendations.    Effective January 1st, 2014, we ask that you re-schedule your appointment with our physicians should you arrive 10 or more minutes late for your appointment.  We strive to give you quality time with our providers, and arriving late affects you and other patients whose appointments are after yours.    Again, thank you for choosing [facility].  Our hope is that these requests will decrease the amount of time that you wait before being seen by our physicians.       _____________________________________________________________  Should you have questions after your visit to [facility], please contact our office at [phone] between the hours of 8:30 a.m. and 5:00 p.m.  Voicemails left after 4:30 p.m. will not be returned until the following business day.  For prescription refill requests, have your pharmacy contact our office with your prescription refill request.

## 2013-04-17 ENCOUNTER — Telehealth (HOSPITAL_COMMUNITY): Payer: Self-pay | Admitting: Oncology

## 2013-04-17 NOTE — Telephone Encounter (Signed)
Erika Moreno reports tolerating Avastin well.  No complaints other than known leg swelling for which she spoke to the MD about yesterday.

## 2013-04-22 NOTE — Progress Notes (Signed)
[name], MD [address] [place] [address]  Cancer of unknown origin  Anemia due to chemotherapy - Plan: CBC with Differential ([facility]), darbepoetin alfa-polysorbate (ARANESP) injection 500 mcg  Pruritus - Plan: famotidine (PEPCID) 20 MG tablet  CURRENT THERAPY: Avastin maintenance every 21 days beginning on 04/16/2013  INTERVAL HISTORY: MILCAH DULANY 68 y.o. 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 No definitive debulking surgery was performed.  S/P 4 cycles of Carboplatin/Paclitaxel with an excellent response to therapy, but bone marrow toxicity requiring blood transfusions.  Now on maintenance Avastin beginning on 04/16/2013.    Cancer of unknown origin   11/15/2012 - 02/21/2013 Chemotherapy Carboplatin/Paclitaxel   11/15/2012 Treatment Plan Change Transfer of care to [facility] from [facility]   03/22/2013 Imaging PET scan- CR   04/16/2013 -  Chemotherapy Avastin maintenance every 21 days   I personally reviewed and went over laboratory results with the patient.  Results follow below.   I personally reviewed and went over radiographic studies with the patient.  A complete CR is noted on PET scan in December 2014.   She reports diffuse pruritis x 2 weeks.  She notes that there is not a rash on her body.  She does have dry skin.  She is using moisturizing lotion.   She is on Loratadine and I have recommended that she continue with this medication.   I will add Pepcid to her regimen to see if that helps with the pruritis.  She will call us in 3 days if not better.   Additionally, she notes that she is sleeping more.  I suspect this may be from narcotics and possibly the accumulation of long-acting narcotic.  Therefore, we will decrease this to once daily.   Oncologically,  she denies any complaints and ROS questioning is negative.  Past Medical History  Diagnosis Date  . 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 Cervical cancer 1984  . Myocardial infarction 20 yrs ago  . Coronary artery disease   . Anxiety   . Chronic back pain   . Depression   . GERD (gastroesophageal reflux disease)   . Hypertension   . Hypothyroidism   . Anemia   . Vitamin B 12 deficiency   . Vitamin D deficiency disease     has Cancer of unknown origin; Anemia due to chemotherapy; Acute sinusitis; Normocytic anemia; Antineoplastic chemotherapy induced pancytopenia; UTI (lower urinary tract infection); Dyspnea on exertion; Neuropathy due to chemotherapeutic drug; and Thrombophlebitis leg superficial on her problem list.     is allergic to morphine and related and sulfa antibiotics.  Ms. Mcdermott had no medications administered during this visit.  Past Surgical History  Procedure Laterality Date  . Heart bypass      triple bypass with stents  . Coronary artery bypass graft      3 vessels  . Cardiac catheterization      stents x3  . Abdominal hysterectomy    . Portacath placement Right 12/06/2012    Procedure: INSERTION PORT-A-CATH;  Surgeon: Donato Heinz, MD;  Location: [facility];  Service: General;  Laterality: Right;    Denies any headaches, dizziness, double vision, fevers, chills, night sweats, nausea, vomiting, diarrhea, constipation, chest pain, heart palpitations, shortness of breath, blood in  stool, black tarry stool, urinary pain, urinary burning, urinary frequency, hematuria.   PHYSICAL EXAMINATION  ECOG PERFORMANCE STATUS: 1 - Symptomatic but completely ambulatory  Filed Vitals:   04/23/13 1146  BP: 97/60  Pulse: 71  Temp: 99.2 F (37.3 C)  Resp: 16    GENERAL:alert, no distress, well nourished, well developed, comfortable, cooperative and smiling SKIN: dry skin with excoriations and erythema from itching.  She is itching her  arms, back, chest, and legs.  No obvious rash noted. HEAD: Normocephalic, No masses, lesions, tenderness or abnormalities EYES: normal, PERRLA, EOMI, Conjunctiva are pink and non-injected EARS: External ears normal OROPHARYNX:mucous membranes are moist  NECK: supple, trachea midline LYMPH:  not examined BREAST:not examined LUNGS: clear to auscultation  HEART: regular rate & rhythm, no murmurs and no gallops ABDOMEN:normal bowel sounds BACK: Back symmetric, no curvature. EXTREMITIES:no skin discoloration, no clubbing, no cyanosis, positive findings:  edema B/L 1-2+ pitting edema, much improved  NEURO: alert & oriented x 3 with fluent speech, no focal motor/sensory deficits, gait normal    LABORATORY DATA: CBC    Component Value Date/Time   WBC 3.6* 04/16/2013 1002   RBC 2.76* 04/16/2013 1002   HGB 9.3* 04/16/2013 1002   HCT 26.1* 04/16/2013 1002   PLT 125* 04/16/2013 1002   MCV 94.6 04/16/2013 1002   MCH 33.7 04/16/2013 1002   MCHC 35.6 04/16/2013 1002   RDW 16.8* 04/16/2013 1002   LYMPHSABS 1.2 04/16/2013 1002   MONOABS 0.3 04/16/2013 1002   EOSABS 0.1 04/16/2013 1002   BASOSABS 0.0 04/16/2013 1002      Chemistry      Component Value Date/Time   NA 139 04/16/2013 1002   K 4.1 04/16/2013 1002   CL 100 04/16/2013 1002   CO2 28 04/16/2013 1002   BUN 25* 04/16/2013 1002   CREATININE 1.02 04/16/2013 1002      Component Value Date/Time   CALCIUM 9.0 04/16/2013 1002   ALKPHOS 79 04/16/2013 1002   AST 14 04/16/2013 1002   ALT 8 04/16/2013 1002   BILITOT 0.3 04/16/2013 1002       RADIOGRAPHIC STUDIES:  03/22/2013  CLINICAL DATA: Subsequent treatment strategy for metastatic  adenocarcinoma of unknown primary. Abdominal disease. [name]  EXAM:  NUCLEAR MEDICINE PET SKULL BASE TO THIGH  FASTING BLOOD GLUCOSE: Value: 93mg /dl  TECHNIQUE:  19.0 mCi F-18 FDG was injected intravenously. CT data was obtained  and used for attenuation correction and anatomic localization only.  (This was not  acquired as a diagnostic CT examination.) Additional  exam technical data entered on technologist worksheet.  COMPARISON: NM PET IMAGE INITIAL (PI) SKULL BASE TO THIGH dated  10/03/2012; CT ABD - PELV W/ CM dated 01/12/2013  FINDINGS:  NECK  No hypermetabolic lymph nodes in the neck.  CHEST  Interval resolution of the hypermetabolic mediastinal adenopathy. 15  mm subcarinal lymph node remains with no metabolic activity above  background blood pool activity. Mild metabolic activity associated  with axillary lymph nodes (image 73 and 75) on the right left  respectively is below blood pool activity. No suspicious pulmonary  nodules. There is perihilar diffuse ground-glass opacities without  nodularity. This finding is new from CT of 01/12/2013  ABDOMEN/PELVIS  Interval resolution of the hypermetabolic periaortic adenopathy.  There are small sub cm left periaortic lymph nodes which have  metabolic activity less than blood pool activity. The activity  associated with the bilobed left adnexal lesions is also decreased  in the interval. No significant metabolic activity  remains. Large  bilobed lesion in the left adnexa has decreased slightly in volume  measuring measuring 6.9 x 3.7 cm compared to 8.7 x 5.3 cm on prior.  SKELETON  No focal hypermetabolic activity to suggest skeletal metastasis.  IMPRESSION:  1. No residual significant metabolic activity within the small lymph  nodes of the axilla, mediastinum, and retroperitoneum to suggest  active metastatic disease. Minimal residual activity is likely  reactive.  2. No evidence disease progression or new disease.  3. Slight decrease in volume of bilobed lesion associated with the  left adnexa.  4. New diffuse perihilar ground-glass pulmonary opacities.  Differential includes pulmonary edema versus drug reaction.  Electronically Signed  By: Suzy Bouchard M.D.  On: 03/22/2013 14:23      ASSESSMENT:  1. Peritoneal carcinomatosis  with excellent response to carboplatin and Taxol both with resultant neuropathy, PET/CT scan normal with a CR on 03/22/2013. No definitive debulking surgery was performed.  S/P 4 cycles of Carboplatin/Paclitaxel with an excellent response to therapy, but bone marrow toxicity requiring blood transfusions.  Now on maintenance Avastin beginning on 04/16/2013. 2, Cellulitis versus vasculitis lower extremities bilaterally with edema. 3. Chemotherapy-induced peripheral neuropathy, on Cymbalta 60 mg at bedtime along with MS Contin 30 mg every 12 hours with oxycodone 10 mg up to every 4 hours as needed for breakthrough pain. 4. Anemia secondary chemotherapy. 5. Diffuse pruritis, ?narcotic-induced versus dry skin versus chemotherapy induced. 6. Increased drowsiness  Patient Active Problem List   Diagnosis Date Noted  . Thrombophlebitis leg superficial 02/12/2013  . Acute sinusitis 02/10/2013  . Normocytic anemia 02/10/2013  . Antineoplastic chemotherapy induced pancytopenia 02/10/2013  . UTI (lower urinary tract infection) 02/10/2013  . Dyspnea on exertion 02/10/2013  . Neuropathy due to chemotherapeutic drug 02/10/2013  . Anemia due to chemotherapy 01/24/2013  . Cancer of unknown origin 11/14/2012     PLAN:  1. I personally reviewed and went over laboratory results with the patient. 2. I personally reviewed and went over radiographic studies with the patient. 3. Pre-chemo labs: CBC diff, CMET, UA every 21 days 4. Aranesp 500 mcg today.  Will repeat in 2 weeks the day of chemotherapy and then every 3 weeks to help consolidate appointments since she lives in [place]. 5. Pepcid 20 mg BID for pruritis 6. Continue Claritin daily 7. Call the clinic in 3 days if pruritis continues or does not improve 8. Decrease MS Contin to 1 tablet daily.  Continue with breakthrough pain medication. 9. Patient education with personalized illustration regarding pain regimen. 10. Recommended EtOH-free moisturizing  lotion 11. Return as scheduled.    THERAPY PLAN:  We will get her on an improved schedule of Aranesp 500 mcg every 3 weeks with a shorter interval between injections today and in 2 weeks (day of chemotherapy).  We will work on pruritis control.  She will also continue with Avastin maintenance therapy.   All questions were answered. The patient knows to call the clinic with any problems, questions or concerns. We can certainly see the patient much sooner if necessary.  Patient and plan discussed with Dr. Farrel Gobble and he is in agreement with the aforementioned.   Jorah Hua

## 2013-04-23 ENCOUNTER — Encounter (HOSPITAL_BASED_OUTPATIENT_CLINIC_OR_DEPARTMENT_OTHER): Payer: PRIVATE HEALTH INSURANCE | Admitting: Oncology

## 2013-04-23 ENCOUNTER — Encounter (HOSPITAL_COMMUNITY): Payer: Self-pay | Admitting: Oncology

## 2013-04-23 VITALS — BP 97/60 | HR 71 | Temp 99.2°F | Resp 16

## 2013-04-23 DIAGNOSIS — L299 Pruritus, unspecified: Secondary | ICD-10-CM

## 2013-04-23 DIAGNOSIS — D6481 Anemia due to antineoplastic chemotherapy: Secondary | ICD-10-CM

## 2013-04-23 DIAGNOSIS — T451X5A Adverse effect of antineoplastic and immunosuppressive drugs, initial encounter: Secondary | ICD-10-CM

## 2013-04-23 DIAGNOSIS — C801 Malignant (primary) neoplasm, unspecified: Secondary | ICD-10-CM

## 2013-04-23 DIAGNOSIS — C786 Secondary malignant neoplasm of retroperitoneum and peritoneum: Secondary | ICD-10-CM

## 2013-04-23 DIAGNOSIS — G609 Hereditary and idiopathic neuropathy, unspecified: Secondary | ICD-10-CM

## 2013-04-23 MED ORDER — FAMOTIDINE 20 MG PO TABS: 20.0000 mg | ORAL_TABLET | Freq: Two times a day (BID) | ORAL | Status: DC

## 2013-04-23 MED ORDER — MORPHINE SULFATE ER 30 MG PO TBCR: 30.0000 mg | EXTENDED_RELEASE_TABLET | Freq: Every day | ORAL | Status: DC

## 2013-04-23 MED ORDER — DARBEPOETIN ALFA-POLYSORBATE 500 MCG/ML IJ SOLN
500.0000 ug | INTRAMUSCULAR | Status: DC
  Administered 2013-04-23: 500 ug via SUBCUTANEOUS
  Filled 2013-04-23: qty 1

## 2013-04-23 NOTE — Patient Instructions (Signed)
[name] Discharge Instructions  RECOMMENDATIONS MADE BY THE CONSULTANT AND ANY TEST RESULTS WILL BE SENT TO YOUR REFERRING PHYSICIAN.  EXAM FINDINGS BY THE PHYSICIAN TODAY AND SIGNS OR SYMPTOMS TO REPORT TO CLINIC OR PRIMARY PHYSICIAN: Exam and findings as discussed by Robynn Pane, PA-C.  We will restart your aranesp today and if your hemoglobin is still low in 2 weeks you will get it again.  MEDICATIONS PRESCRIBED:  Decrease your MS Contin to 1 tablet daily Continue Loratidine Pepcid - take as directed.  INSTRUCTIONS/FOLLOW-UP: Follow-up as scheduled and labs and Aranesp every 3 weeks.  Thank you for choosing [facility] to provide your oncology and hematology care.  To afford each patient quality time with our providers, please arrive at least 15 minutes before your scheduled appointment time.  With your help, our goal is to use those 15 minutes to complete the necessary work-up to ensure our physicians have the information they need to help with your evaluation and healthcare recommendations.    Effective January 1st, 2014, we ask that you re-schedule your appointment with our physicians should you arrive 10 or more minutes late for your appointment.  We strive to give you quality time with our providers, and arriving late affects you and other patients whose appointments are after yours.    Again, thank you for choosing [facility].  Our hope is that these requests will decrease the amount of time that you wait before being seen by our physicians.       _____________________________________________________________  Should you have questions after your visit to [facility], please contact our office at [phone] between the hours of 8:30 a.m. and 5:00 p.m.  Voicemails left after 4:30 p.m. will not be returned until the following business day.  For prescription refill requests, have your pharmacy contact our office with  your prescription refill request.

## 2013-04-23 NOTE — Progress Notes (Signed)
Erika Moreno presents today for injection per MD orders. Aranesp 500 mcg administered SQ in right Abdomen. Administration without incident. Patient tolerated well.  

## 2013-04-24 ENCOUNTER — Ambulatory Visit (HOSPITAL_COMMUNITY): Payer: PRIVATE HEALTH INSURANCE

## 2013-04-25 ENCOUNTER — Inpatient Hospital Stay (HOSPITAL_COMMUNITY): Payer: PRIVATE HEALTH INSURANCE

## 2013-04-25 ENCOUNTER — Ambulatory Visit (HOSPITAL_COMMUNITY): Payer: PRIVATE HEALTH INSURANCE

## 2013-05-07 ENCOUNTER — Ambulatory Visit (HOSPITAL_COMMUNITY): Payer: PRIVATE HEALTH INSURANCE

## 2013-05-07 ENCOUNTER — Inpatient Hospital Stay (HOSPITAL_COMMUNITY): Payer: PRIVATE HEALTH INSURANCE

## 2013-05-07 ENCOUNTER — Encounter (HOSPITAL_BASED_OUTPATIENT_CLINIC_OR_DEPARTMENT_OTHER): Payer: PRIVATE HEALTH INSURANCE

## 2013-05-07 ENCOUNTER — Encounter (HOSPITAL_COMMUNITY): Payer: PRIVATE HEALTH INSURANCE | Attending: Internal Medicine

## 2013-05-07 ENCOUNTER — Encounter (HOSPITAL_COMMUNITY): Payer: Self-pay

## 2013-05-07 VITALS — BP 129/82 | HR 84 | Temp 97.9°F | Resp 18 | Wt 146.2 lb

## 2013-05-07 DIAGNOSIS — D6481 Anemia due to antineoplastic chemotherapy: Secondary | ICD-10-CM

## 2013-05-07 DIAGNOSIS — L299 Pruritus, unspecified: Secondary | ICD-10-CM | POA: Insufficient documentation

## 2013-05-07 DIAGNOSIS — C801 Malignant (primary) neoplasm, unspecified: Secondary | ICD-10-CM | POA: Insufficient documentation

## 2013-05-07 DIAGNOSIS — R600 Localized edema: Secondary | ICD-10-CM

## 2013-05-07 DIAGNOSIS — Z5112 Encounter for antineoplastic immunotherapy: Secondary | ICD-10-CM

## 2013-05-07 DIAGNOSIS — T451X5A Adverse effect of antineoplastic and immunosuppressive drugs, initial encounter: Secondary | ICD-10-CM | POA: Insufficient documentation

## 2013-05-07 DIAGNOSIS — C786 Secondary malignant neoplasm of retroperitoneum and peritoneum: Secondary | ICD-10-CM

## 2013-05-07 DIAGNOSIS — R609 Edema, unspecified: Secondary | ICD-10-CM

## 2013-05-07 DIAGNOSIS — G609 Hereditary and idiopathic neuropathy, unspecified: Secondary | ICD-10-CM

## 2013-05-07 LAB — CBC WITH DIFFERENTIAL/PLATELET
BASOS ABS: 0 10*3/uL (ref 0.0–0.1)
Basophils Relative: 1 % (ref 0–1)
EOS ABS: 0.1 10*3/uL (ref 0.0–0.7)
EOS PCT: 3 % (ref 0–5)
HEMATOCRIT: 33.5 % — AB (ref 36.0–46.0)
Hemoglobin: 11.1 g/dL — ABNORMAL LOW (ref 12.0–15.0)
LYMPHS ABS: 1.4 10*3/uL (ref 0.7–4.0)
LYMPHS PCT: 35 % (ref 12–46)
MCH: 31.6 pg (ref 26.0–34.0)
MCHC: 33.1 g/dL (ref 30.0–36.0)
MCV: 95.4 fL (ref 78.0–100.0)
MONO ABS: 0.3 10*3/uL (ref 0.1–1.0)
Monocytes Relative: 7 % (ref 3–12)
Neutro Abs: 2.3 10*3/uL (ref 1.7–7.7)
Neutrophils Relative %: 55 % (ref 43–77)
Platelets: 169 10*3/uL (ref 150–400)
RBC: 3.51 MIL/uL — ABNORMAL LOW (ref 3.87–5.11)
RDW: 15.8 % — AB (ref 11.5–15.5)
WBC: 4.1 10*3/uL (ref 4.0–10.5)

## 2013-05-07 LAB — URINALYSIS, DIPSTICK ONLY
Bilirubin Urine: NEGATIVE
GLUCOSE, UA: NEGATIVE mg/dL
Ketones, ur: NEGATIVE mg/dL
Nitrite: NEGATIVE
PH: 5 (ref 5.0–8.0)
Protein, ur: NEGATIVE mg/dL
Specific Gravity, Urine: 1.015 (ref 1.005–1.030)
Urobilinogen, UA: 0.2 mg/dL (ref 0.0–1.0)

## 2013-05-07 LAB — COMPREHENSIVE METABOLIC PANEL
ALT: 9 U/L (ref 0–35)
AST: 16 U/L (ref 0–37)
Albumin: 3.6 g/dL (ref 3.5–5.2)
Alkaline Phosphatase: 82 U/L (ref 39–117)
BUN: 16 mg/dL (ref 6–23)
CALCIUM: 9.5 mg/dL (ref 8.4–10.5)
CO2: 27 meq/L (ref 19–32)
CREATININE: 0.73 mg/dL (ref 0.50–1.10)
Chloride: 99 mEq/L (ref 96–112)
GFR, EST NON AFRICAN AMERICAN: 86 mL/min — AB (ref >90)
GLUCOSE: 102 mg/dL — AB (ref 70–99)
Potassium: 4.2 mEq/L (ref 3.7–5.3)
Sodium: 139 mEq/L (ref 137–147)
Total Bilirubin: 0.3 mg/dL (ref 0.3–1.2)
Total Protein: 7.6 g/dL (ref 6.0–8.3)

## 2013-05-07 MED ORDER — SODIUM CHLORIDE 0.9 % IV SOLN
Freq: Once | INTRAVENOUS | Status: AC
  Administered 2013-05-07: 09:00:00 via INTRAVENOUS

## 2013-05-07 MED ORDER — HEPARIN SOD (PORK) LOCK FLUSH 100 UNIT/ML IV SOLN
500.0000 [IU] | Freq: Once | INTRAVENOUS | Status: AC | PRN
  Administered 2013-05-07: 500 [IU]
  Filled 2013-05-07: qty 5

## 2013-05-07 MED ORDER — SODIUM CHLORIDE 0.9 % IJ SOLN: 10.0000 mL | INTRAMUSCULAR | Status: DC | PRN

## 2013-05-07 MED ORDER — SODIUM CHLORIDE 0.9 % IV SOLN
15.0000 mg/kg | Freq: Once | INTRAVENOUS | Status: AC
  Administered 2013-05-07: 1050 mg via INTRAVENOUS
  Filled 2013-05-07: qty 34

## 2013-05-07 MED ORDER — OXYCODONE HCL 10 MG PO TABS: ORAL_TABLET | ORAL | Status: DC

## 2013-05-07 NOTE — Progress Notes (Signed)
[facility]  OFFICE PROGRESS NOTE  [name], [place] [id number]  DIAGNOSIS: Peritoneal carcinomatosis  Edema of both legs  Chief Complaint  Patient presents with  . Maintenance therapy    Peritoneal carcinomatosis    CURRENT THERAPY: Maintenance Avastin every 3 weeks started on 04/16/2013  INTERVAL HISTORY: Erika Moreno 68 y.o. 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 Primary complaint is pruritus involving both lower extremities as well as upper extremities with subcutaneous "bumps." She denies any nausea, vomiting, but has been constipated. She uses glycerin suppositories and occasional enema in an effort to maintain bowel movements. She is using topical products on her upper and lower extremities and has noticed decreased swelling of the lower since institution of furosemide therapy. She denies any fever, night sweats, dysuria, hematuria, incontinence, but with occasional cough and shortness of breath on exertion. She feels her hemoglobin may be low. She denies any PND, orthopnea, palpitations, headache, or seizures.  MEDICAL HISTORY: Past Medical History  Diagnosis Date  . 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 Cervical cancer 1984  . Myocardial infarction 20 yrs ago  . Coronary artery disease   . Anxiety   . Chronic back pain   . Depression   . GERD (gastroesophageal reflux disease)   . Hypertension   . Hypothyroidism   . Anemia   . Vitamin B 12 deficiency   . Vitamin D deficiency disease     INTERIM HISTORY: has Cancer of unknown origin; Anemia due to chemotherapy; Acute sinusitis; Normocytic anemia;  Antineoplastic chemotherapy induced pancytopenia; UTI (lower urinary tract infection); Dyspnea on exertion; Neuropathy due to chemotherapeutic drug; and Thrombophlebitis leg superficial on her problem list.   Cancer of unknown origin  11/15/2012 - 02/21/2013  Chemotherapy  Carboplatin/Paclitaxel  11/15/2012  Treatment Plan Change  Transfer of care to [facility] from [facility]  03/22/2013  Imaging  PET scan- CR  04/16/2013 -  Chemotherapy  Avastin maintenance every 21 days   ALLERGIES:  is allergic to morphine and related and sulfa antibiotics.  MEDICATIONS: has a current medication list which includes the following prescription(s): alprazolam, clopidogrel, cyclobenzaprine, dexlansoprazole, duloxetine, famotidine, furosemide, isosorbide mononitrate, lidocaine-prilocaine, lisinopril-hydrochlorothiazide, loratadine, metoclopramide, morphine, nitroglycerin, ondansetron, oxycodone hcl, oxymetazoline, potassium chloride sa, sodium chloride, and venlafaxine, and the following Facility-Administered Medications: bevacizumab (AVASTIN) 1,050 mg in sodium chloride 0.9 % 100 mL chemo infusion, heparin lock flush, and sodium chloride.  SURGICAL HISTORY:  Past Surgical History  Procedure Laterality Date  . Heart bypass      triple bypass with stents  . Coronary artery bypass graft      3 vessels  . Cardiac catheterization      stents x3  . Abdominal hysterectomy    . Portacath placement Right 12/06/2012    Procedure: INSERTION PORT-A-CATH;  Surgeon: Donato Heinz, MD;  Location: [facility];  Service: General;  Laterality: Right;    FAMILY HISTORY: family history includes Heart attack in her father and mother.  SOCIAL HISTORY:  reports that she quit smoking about 24 years ago. Her smoking use included Cigarettes. She has a 44 pack-year smoking history. She has  never used smokeless tobacco. She reports that she does not drink alcohol or use illicit drugs.  REVIEW OF  SYSTEMS:  Other than that discussed above is noncontributory.  PHYSICAL EXAMINATION: ECOG PERFORMANCE STATUS: 1 - Symptomatic but completely ambulatory  There were no vitals taken for this visit.  GENERAL:alert, no distress and comfortable SKIN: Subcutaneous urticaria along both lower extremities and left upper extremity. No associated excoriations or purulence. EYES: PERLA; Conjunctiva are pink and non-injected, sclera clear OROPHARYNX:no exudate, no erythema on lips, buccal mucosa, or tongue. NECK: supple, thyroid normal size, non-tender, without nodularity. No masses CHEST: LifePort in place. No breast masses. LYMPH:  no palpable lymphadenopathy in the cervical, axillary or inguinal LUNGS: clear to auscultation and percussion with normal breathing effort HEART: regular rate & rhythm and no murmurs. ABDOMEN:abdomen soft, non-tender and normal bowel sounds. Soft no distention. Liver and spleen not enlarged. MUSCULOSKELETAL:no cyanosis of digits and no clubbing. Range of motion normal.  NEURO: alert & oriented x 3 with fluent speech, no focal motor/sensory deficits   LABORATORY DATA: Infusion on 05/07/2013  Component Date Value Range Status  . WBC 05/07/2013 4.1  4.0 - 10.5 K/uL Final  . RBC 05/07/2013 3.51* 3.87 - 5.11 MIL/uL Final  . Hemoglobin 05/07/2013 11.1* 12.0 - 15.0 g/dL Final  . HCT 05/07/2013 33.5* 36.0 - 46.0 % Final  . MCV 05/07/2013 95.4  78.0 - 100.0 fL Final  . MCH 05/07/2013 31.6  26.0 - 34.0 pg Final  . MCHC 05/07/2013 33.1  30.0 - 36.0 g/dL Final  . RDW 05/07/2013 15.8* 11.5 - 15.5 % Final  . Platelets 05/07/2013 169  150 - 400 K/uL Final  . Neutrophils Relative % 05/07/2013 55  43 - 77 % Final  . Neutro Abs 05/07/2013 2.3  1.7 - 7.7 K/uL Final  . Lymphocytes Relative 05/07/2013 35  12 - 46 % Final  . Lymphs Abs 05/07/2013 1.4  0.7 - 4.0 K/uL Final  . Monocytes Relative 05/07/2013 7  3 - 12 % Final  . Monocytes Absolute 05/07/2013 0.3  0.1 - 1.0 K/uL Final  .  Eosinophils Relative 05/07/2013 3  0 - 5 % Final  . Eosinophils Absolute 05/07/2013 0.1  0.0 - 0.7 K/uL Final  . Basophils Relative 05/07/2013 1  0 - 1 % Final  . Basophils Absolute 05/07/2013 0.0  0.0 - 0.1 K/uL Final  . Sodium 05/07/2013 139  137 - 147 mEq/L Final  . Potassium 05/07/2013 4.2  3.7 - 5.3 mEq/L Final  . Chloride 05/07/2013 99  96 - 112 mEq/L Final  . CO2 05/07/2013 27  19 - 32 mEq/L Final  . Glucose, Bld 05/07/2013 102* 70 - 99 mg/dL Final  . BUN 05/07/2013 16  6 - 23 mg/dL Final  . Creatinine, Ser 05/07/2013 0.73  0.50 - 1.10 mg/dL Final  . Calcium 05/07/2013 9.5  8.4 - 10.5 mg/dL Final  . Total Protein 05/07/2013 7.6  6.0 - 8.3 g/dL Final  . Albumin 05/07/2013 3.6  3.5 - 5.2 g/dL Final  . AST 05/07/2013 16  0 - 37 U/L Final  . ALT 05/07/2013 9  0 - 35 U/L Final  . Alkaline Phosphatase 05/07/2013 82  39 - 117 U/L Final  . Total Bilirubin 05/07/2013 0.3  0.3 - 1.2 mg/dL Final  . GFR calc non Af Amer 05/07/2013 86* >90 mL/min Final  . GFR calc Af Amer 05/07/2013 >90  >90 mL/min Final   Comment: (NOTE)  The eGFR has been calculated using the CKD EPI equation.                          This calculation has not been validated in all clinical situations.                          eGFR's persistently <90 mL/min signify possible Chronic Kidney                          Disease.  [name] Specific Gravity, Urine 05/07/2013 1.015  1.005 - 1.030 Final  . pH 05/07/2013 5.0  5.0 - 8.0 Final  . Glucose, UA 05/07/2013 NEGATIVE  NEGATIVE mg/dL Final  . Hgb urine dipstick 05/07/2013 TRACE* NEGATIVE Final  . Bilirubin Urine 05/07/2013 NEGATIVE  NEGATIVE Final  . Ketones, ur 05/07/2013 NEGATIVE  NEGATIVE mg/dL Final  . Protein, ur 05/07/2013 NEGATIVE  NEGATIVE mg/dL Final  . Urobilinogen, UA 05/07/2013 0.2  0.0 - 1.0 mg/dL Final  . Nitrite 05/07/2013 NEGATIVE  NEGATIVE Final  . Leukocytes, UA 05/07/2013 MODERATE* NEGATIVE Final  Infusion on 04/16/2013  Component Date  Value Range Status  . WBC 04/16/2013 3.6* 4.0 - 10.5 K/uL Final  . RBC 04/16/2013 2.76* 3.87 - 5.11 MIL/uL Final  . Hemoglobin 04/16/2013 9.3* 12.0 - 15.0 g/dL Final  . HCT 04/16/2013 26.1* 36.0 - 46.0 % Final  . MCV 04/16/2013 94.6  78.0 - 100.0 fL Final  . MCH 04/16/2013 33.7  26.0 - 34.0 pg Final  . MCHC 04/16/2013 35.6  30.0 - 36.0 g/dL Final  . RDW 04/16/2013 16.8* 11.5 - 15.5 % Final  . Platelets 04/16/2013 125* 150 - 400 K/uL Final  . Neutrophils Relative % 04/16/2013 56  43 - 77 % Final  . Neutro Abs 04/16/2013 2.0  1.7 - 7.7 K/uL Final  . Lymphocytes Relative 04/16/2013 32  12 - 46 % Final  . Lymphs Abs 04/16/2013 1.2  0.7 - 4.0 K/uL Final  . Monocytes Relative 04/16/2013 9  3 - 12 % Final  . Monocytes Absolute 04/16/2013 0.3  0.1 - 1.0 K/uL Final  . Eosinophils Relative 04/16/2013 3  0 - 5 % Final  . Eosinophils Absolute 04/16/2013 0.1  0.0 - 0.7 K/uL Final  . Basophils Relative 04/16/2013 1  0 - 1 % Final  . Basophils Absolute 04/16/2013 0.0  0.0 - 0.1 K/uL Final  . Sodium 04/16/2013 139  137 - 147 mEq/L Final  . Potassium 04/16/2013 4.1  3.7 - 5.3 mEq/L Final  . Chloride 04/16/2013 100  96 - 112 mEq/L Final  . CO2 04/16/2013 28  19 - 32 mEq/L Final  . Glucose, Bld 04/16/2013 99  70 - 99 mg/dL Final  . BUN 04/16/2013 25* 6 - 23 mg/dL Final  . Creatinine, Ser 04/16/2013 1.02  0.50 - 1.10 mg/dL Final  . Calcium 04/16/2013 9.0  8.4 - 10.5 mg/dL Final  . Total Protein 04/16/2013 6.8  6.0 - 8.3 g/dL Final  . Albumin 04/16/2013 3.3* 3.5 - 5.2 g/dL Final  . AST 04/16/2013 14  0 - 37 U/L Final  . ALT 04/16/2013 8  0 - 35 U/L Final  . Alkaline Phosphatase 04/16/2013 79  39 - 117 U/L Final  . Total Bilirubin 04/16/2013 0.3  0.3 - 1.2 mg/dL Final  . GFR calc non Af Amer 04/16/2013 55* >90 mL/min Final  . GFR calc  Af Amer 04/16/2013 64* >90 mL/min Final   Comment: (NOTE)                          The eGFR has been calculated using the CKD EPI equation.                           This calculation has not been validated in all clinical situations.                          eGFR's persistently <90 mL/min signify possible Chronic Kidney                          Disease.  [name] Specific Gravity, Urine 04/16/2013 1.010  1.005 - 1.030 Final  . pH 04/16/2013 7.5  5.0 - 8.0 Final  . Glucose, UA 04/16/2013 NEGATIVE  NEGATIVE mg/dL Final  . Hgb urine dipstick 04/16/2013 NEGATIVE  NEGATIVE Final  . Bilirubin Urine 04/16/2013 NEGATIVE  NEGATIVE Final  . Ketones, ur 04/16/2013 NEGATIVE  NEGATIVE mg/dL Final  . Protein, ur 04/16/2013 NEGATIVE  NEGATIVE mg/dL Final  . Urobilinogen, UA 04/16/2013 0.2  0.0 - 1.0 mg/dL Final  . Nitrite 04/16/2013 NEGATIVE  NEGATIVE Final  . Leukocytes, UA 04/16/2013 SMALL* NEGATIVE Final    PATHOLOGY: No new pathology.  Urinalysis    Component Value Date/Time   COLORURINE YELLOW 02/10/2013 1650   APPEARANCEUR CLOUDY* 02/10/2013 1650   LABSPEC 1.015 05/07/2013 0819   PHURINE 5.0 05/07/2013 0819   GLUCOSEU NEGATIVE 05/07/2013 0819   HGBUR TRACE* 05/07/2013 0819   BILIRUBINUR NEGATIVE 05/07/2013 0819   KETONESUR NEGATIVE 05/07/2013 0819   PROTEINUR NEGATIVE 05/07/2013 0819   UROBILINOGEN 0.2 05/07/2013 0819   NITRITE NEGATIVE 05/07/2013 0819   LEUKOCYTESUR MODERATE* 05/07/2013 0819    RADIOGRAPHIC STUDIES: NM PET Image Restag (PS) Skull Base To Thigh Status: Final result         PACS Images    Show images for NM PET Image Restag (PS) Skull Base To Thigh         Study Result    CLINICAL DATA: Subsequent treatment strategy for metastatic  adenocarcinoma of unknown primary. Abdominal disease. [name]  EXAM:  NUCLEAR MEDICINE PET SKULL BASE TO THIGH  FASTING BLOOD GLUCOSE: Value: $RemoveBeforeD'93mg'eYGqqkeOQuKcSf$ /dl  TECHNIQUE:  19.0 mCi F-18 FDG was injected intravenously. CT data was obtained  and used for attenuation correction and anatomic localization only.  (This was not acquired as a diagnostic CT examination.) Additional  exam technical data entered on technologist worksheet.    COMPARISON: NM PET IMAGE INITIAL (PI) SKULL BASE TO THIGH dated  10/03/2012; CT ABD - PELV W/ CM dated 01/12/2013  FINDINGS:  NECK  No hypermetabolic lymph nodes in the neck.  CHEST  Interval resolution of the hypermetabolic mediastinal adenopathy. 15  mm subcarinal lymph node remains with no metabolic activity above  background blood pool activity. Mild metabolic activity associated  with axillary lymph nodes (image 73 and 75) on the right left  respectively is below blood pool activity. No suspicious pulmonary  nodules. There is perihilar diffuse ground-glass opacities without  nodularity. This finding is new from CT of 01/12/2013  ABDOMEN/PELVIS  Interval resolution of the hypermetabolic periaortic adenopathy.  There are small sub cm left periaortic lymph nodes which have  metabolic activity less than blood pool activity. The activity  associated with  the bilobed left adnexal lesions is also decreased  in the interval. No significant metabolic activity remains. Large  bilobed lesion in the left adnexa has decreased slightly in volume  measuring measuring 6.9 x 3.7 cm compared to 8.7 x 5.3 cm on prior.  SKELETON  No focal hypermetabolic activity to suggest skeletal metastasis.  IMPRESSION:  1. No residual significant metabolic activity within the small lymph  nodes of the axilla, mediastinum, and retroperitoneum to suggest  active metastatic disease. Minimal residual activity is likely  reactive.  2. No evidence disease progression or new disease.  3. Slight decrease in volume of bilobed lesion associated with the  left adnexa.  4. New diffuse perihilar ground-glass pulmonary opacities.  Differential includes pulmonary edema versus drug reaction.  Electronically Signed  By: Suzy Bouchard M.D.  On: 03/22/2013 14:23     ASSESSMENT:  #1.Peritoneal carcinomatosis with excellent response to carboplatin and Taxol both with resultant neuropathy, PET/CT scan normal with a CR on  03/22/2013. No definitive debulking surgery was performed. S/P 4 cycles of Carboplatin/Paclitaxel with an excellent response to therapy, but bone marrow toxicity requiring blood transfusions. Now on maintenance Avastin beginning on 04/16/2013, tolerating well. #2. Lower extremity vasculitis with subcutaneous urticaria. #3. Peripheral neuropathy, better controlled on Cymbalta 30 mg at bedtime with lower analgesic requirement. #4. Anemia secondary to chemotherapy, improved from 9.3-11.1 after Aranesp was given on 04/16/2013.    PLAN:  #1. Intravenous Avastin today. #2. Hold Aranesp today. #3. Loratadine 10 mg in the morning and Benadryl 25 mg at bedtime. #4. Continue oxycodone as needed for pain and discontinue MS Contin. #5. Followup in 3 weeks with CBC, chem profile, and CA 125 with office visit and treatment.   All questions were answered. The patient knows to call the clinic with any problems, questions or concerns. We can certainly see the patient much sooner if necessary.   I spent 25 minutes counseling the patient face to face. The total time spent in the appointment was 30 minutes.    Doroteo Bradford, MD 05/07/2013 9:13 AM

## 2013-05-07 NOTE — Progress Notes (Signed)
Hgb 11.1 today. Aranesp not given, parameters not met.

## 2013-05-07 NOTE — Patient Instructions (Signed)
[facility] Discharge Instructions for Patients Receiving Chemotherapy  Today you received the following chemotherapy agents Avastin.  To treat the rash and itching on your arms and legs you will need an antihistamine. The doctor has recommended that you take generic Claritin (loratidine) in the morning and generic benadryl (diphenhydramine) at bedtime. The benadryl at bedtime will also aide with sleep. The doctor has also recommended that you take generic Dulcolax (bisacodyl) 1-2 tablets nightly to help relieve constipation. You were given a [name] prescription today for Oxycodone. You do not need the MS Contin any more. Your hemoglobin today is 11.1, you do not need the Aranesp injection today. We will continue with Avastin as scheduled. Appointment with [name] in 3 weeks when here for Avastin.   If you develop nausea and vomiting that is not controlled by your nausea medication, call the clinic. If it is after clinic hours your family physician or the after hours number for the clinic or go to the Emergency Department.   BELOW ARE SYMPTOMS THAT SHOULD BE REPORTED IMMEDIATELY:  *FEVER GREATER THAN 101.0 F  *CHILLS WITH OR WITHOUT FEVER  NAUSEA AND VOMITING THAT IS NOT CONTROLLED WITH YOUR NAUSEA MEDICATION  *UNUSUAL SHORTNESS OF BREATH  *UNUSUAL BRUISING OR BLEEDING  TENDERNESS IN MOUTH AND THROAT WITH OR WITHOUT PRESENCE OF ULCERS  *URINARY PROBLEMS  *BOWEL PROBLEMS  UNUSUAL RASH Items with * indicate a potential emergency and should be followed up as soon as possible.  One of the nurses will contact you 24 hours after your treatment. Please let the nurse know about any problems that you may have experienced. Feel free to call the clinic you have any questions or concerns. The clinic phone number is [phone].   I have been informed and understand all the instructions given to me. I know to contact the clinic, my physician, or go to the Emergency Department if  any problems should occur. I do not have any questions at this time, but understand that I may call the clinic during office hours or the Patient Navigator at [phone] should I have any questions or need assistance in obtaining follow up care.    __________________________________________  _____________  __________ Signature of Patient or Authorized Representative            Date                   Time    __________________________________________ Nurse's Signature

## 2013-05-14 ENCOUNTER — Ambulatory Visit (HOSPITAL_COMMUNITY): Payer: PRIVATE HEALTH INSURANCE

## 2013-05-14 ENCOUNTER — Other Ambulatory Visit (HOSPITAL_COMMUNITY): Payer: PRIVATE HEALTH INSURANCE

## 2013-05-14 ENCOUNTER — Other Ambulatory Visit (HOSPITAL_COMMUNITY): Payer: Self-pay | Admitting: Oncology

## 2013-05-16 ENCOUNTER — Inpatient Hospital Stay (HOSPITAL_COMMUNITY): Payer: PRIVATE HEALTH INSURANCE

## 2013-05-27 NOTE — Progress Notes (Signed)
Erika Hilding, MD [place] [id number]  Cancer of unknown origin - Plan: CT Abdomen Pelvis W Contrast, CT Chest W Contrast, Oxycodone HCl 10 MG TABS  Pruritus - Plan: hydrOXYzine (ATARAX/VISTARIL) 10 MG tablet, predniSONE (DELTASONE) 5 MG tablet  CURRENT THERAPY: Avastin 15 mg/kg every 21 days beginning on 04/16/2013  INTERVAL HISTORY: Erika Moreno 68 y.o. female returns for  regular  visit for followup of Peritoneal carcinomatosis,unknown primary, with excellent response to carboplatin and Taxol, but resulting in neuropathy, PET/CT scan demonstrates a complete remission on 03/22/2013. No definitive debulking surgery was performed. S/P 4 cycles of Carboplatin/Paclitaxel with an excellent response to therapy, but bone marrow toxicity requiring blood transfusions. Now on maintenance Avastin beginning on 04/16/2013, tolerating well.    Cancer of unknown origin   11/15/2012 - 02/21/2013 Chemotherapy Carboplatin/Paclitaxel   11/15/2012 Treatment Plan Change Transfer of care to [facility] from [facility]   03/22/2013 Remission PET scan- CR   04/16/2013 -  Chemotherapy Avastin maintenance every 21 days     I personally reviewed and went over laboratory results with the patient.  The results are noted within this dictation.  She is tolerating therapy very well. She is on Avastin maintenance. She is to complete cycle 3 of this maintenance program today.  Her only complaint is continued pruritus throughout her body. She is taking an antihistamine in the morning and Benadryl at hour of sleep. She reports that this is helpful but the pruritus remains. She reports that her primary care physician put her on a prednisone dose pack with a taper. She reports that this is beneficial for her. As a result, I put her on physiologic dose of prednisone to see if this helps manage her pruritus. Additionally, we'll give her a prescription for Atarax which she can  use every 8 hours as needed. I recommend she use it only at bedtime until she knows that if she is stress you with medication.  She requests refill her oxycodone. She realizes that she is early for this medication but she wants to save a trip to our clinic in 2 weeks or so when she is due for her refill. I've provided her a refill of her oxycodone which she can drop off in her pharmacy and I can fill it appropriately.  Otherwise, oncologically, the patient denies any complaints and ROS questioning is negative.    Past Medical History  Diagnosis Date  . 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 Cervical cancer 1984  . Myocardial infarction 20 yrs ago  . Coronary artery disease   . Anxiety   . Chronic back pain   . Depression   . GERD (gastroesophageal reflux disease)   . Hypertension   . Hypothyroidism   . Anemia   . Vitamin B 12 deficiency   . Vitamin D deficiency disease     has Cancer of unknown origin; Anemia due to chemotherapy; Acute sinusitis; Normocytic anemia; Antineoplastic chemotherapy induced pancytopenia; Dyspnea on exertion; and Neuropathy due to chemotherapeutic drug on her problem list.     is allergic to morphine and related and sulfa antibiotics.  Ms. Paterson had no medications administered during this visit.  Past Surgical History  Procedure Laterality Date  . Heart bypass      triple bypass with stents  . Coronary artery bypass graft      3 vessels  . Cardiac catheterization  stents x3  . Abdominal hysterectomy    . Portacath placement Right 12/06/2012    Procedure: INSERTION PORT-A-CATH;  Surgeon: Donato Heinz, MD;  Location: [facility];  Service: General;  Laterality: Right;    Denies any headaches, dizziness, double vision, fevers, chills, night sweats, nausea, vomiting, diarrhea, constipation, chest pain, heart palpitations, shortness of breath, blood in stool, black tarry stool, urinary pain, urinary burning, urinary  frequency, hematuria.   PHYSICAL EXAMINATION  ECOG PERFORMANCE STATUS: 1 - Symptomatic but completely ambulatory  Filed Vitals:   05/28/13 0902  BP: 106/65  Pulse: 92  Temp: 97.9 F (36.6 C)  Resp: 18    GENERAL:alert, no distress, well nourished, well developed, comfortable, cooperative, smiling and alopecic SKIN: skin color, texture, turgor are normal, no rashes or significant lesions HEAD: Normocephalic, No masses, lesions, tenderness or abnormalities EYES: normal, PERRLA, EOMI, Conjunctiva are pink and non-injected EARS: External ears normal OROPHARYNX:lips, buccal mucosa, and tongue normal and mucous membranes are moist  NECK: supple, no adenopathy, thyroid normal size, non-tender, without nodularity, no stridor, non-tender, trachea midline LYMPH:  no palpable lymphadenopathy BREAST:not examined LUNGS: clear to auscultation  HEART: regular rate & rhythm, no murmurs and no gallops ABDOMEN:abdomen soft, non-tender and normal bowel sounds BACK: Back symmetric, no curvature. EXTREMITIES:less then 2 second capillary refill, no joint deformities, effusion, or inflammation, no edema, no skin discoloration, no clubbing, no cyanosis  NEURO: alert & oriented x 3 with fluent speech, no focal motor/sensory deficits, gait normal    LABORATORY DATA: CBC    Component Value Date/Time   WBC 7.7 05/28/2013 0851   RBC 3.95 05/28/2013 0851   HGB 12.6 05/28/2013 0851   HCT 37.3 05/28/2013 0851   PLT 161 05/28/2013 0851   MCV 94.4 05/28/2013 0851   MCH 31.9 05/28/2013 0851   MCHC 33.8 05/28/2013 0851   RDW 14.7 05/28/2013 0851   LYMPHSABS 2.1 05/28/2013 0851   MONOABS 0.8 05/28/2013 0851   EOSABS 0.2 05/28/2013 0851   BASOSABS 0.0 05/28/2013 0851      Chemistry      Component Value Date/Time   NA 135* 05/28/2013 0851   K 4.6 05/28/2013 0851   CL 98 05/28/2013 0851   CO2 27 05/28/2013 0851   BUN 23 05/28/2013 0851   CREATININE 0.81 05/28/2013 0851      Component Value Date/Time   CALCIUM  9.4 05/28/2013 0851   ALKPHOS 92 05/28/2013 0851   AST 17 05/28/2013 0851   ALT 14 05/28/2013 0851   BILITOT 0.3 05/28/2013 0851       ASSESSMENT:  1. Peritoneal carcinomatosis,unknown primary, with excellent response to carboplatin and Taxol, but resulting in neuropathy, PET/CT scan demonstrates a complete remission on 03/22/2013. No definitive debulking surgery was performed. S/P 4 cycles of Carboplatin/Paclitaxel with an excellent response to therapy, but bone marrow toxicity requiring blood transfusions. Now on maintenance Avastin beginning on 04/16/2013, tolerating well. 2. Lower extremity vasculitis with subcutaneous urticaria. 3. Peripheral neuropathy, better controlled on Cymbalta 30 mg at bedtime with lower analgesic requirement. 4. Anemia secondary to chemotherapy, improved from 9.3-11.1 after Aranesp was given on 04/16/2013. 5. Subcutaneous urticaria  6. Pruritis diffuse  Patient Active Problem List   Diagnosis Date Noted  . Acute sinusitis 02/10/2013  . Normocytic anemia 02/10/2013  . Antineoplastic chemotherapy induced pancytopenia 02/10/2013  . Dyspnea on exertion 02/10/2013  . Neuropathy due to chemotherapeutic drug 02/10/2013  . Anemia due to chemotherapy 01/24/2013  . Cancer of unknown origin 11/14/2012  PLAN:  1. I personally reviewed and went over laboratory results with the patient.  The results are noted within this dictation. 2. Avastin maintenance today as planned. 3. Pre-chemo labs: CBC diff, CMET, UA, CA 125 4. Supportive therapy plan reviewed.  5. CT CAP with contrast in March.  Order placed.  This is for restaging.  6. Rx for Atarax 10 mg every 8 hours PRN pruritis 7. Refill for Oxycodone 10 mg 8. Rx for Prednisone 5 mg daily.  This is being used to see if it will help with pruritis. 9. Return in 3 weeks for follow-up   THERAPY PLAN:  We will continue with maintenance Avastin.  She will need to be restaged following this treatment prior to moving on with  cycle 4 of Avastin.  I have ordered CT CAP with contrast for March 2015 (which is 3 months from previous PET scan).   All questions were answered. The patient knows to call the clinic with any problems, questions or concerns. We can certainly see the patient much sooner if necessary.  Patient and plan discussed with Dr. Farrel Gobble and he is in agreement with the aforementioned.   Susie Pousson

## 2013-05-28 ENCOUNTER — Encounter (HOSPITAL_BASED_OUTPATIENT_CLINIC_OR_DEPARTMENT_OTHER): Payer: PRIVATE HEALTH INSURANCE

## 2013-05-28 ENCOUNTER — Encounter (HOSPITAL_COMMUNITY): Payer: Self-pay | Admitting: Oncology

## 2013-05-28 ENCOUNTER — Encounter (HOSPITAL_BASED_OUTPATIENT_CLINIC_OR_DEPARTMENT_OTHER): Payer: PRIVATE HEALTH INSURANCE | Admitting: Oncology

## 2013-05-28 ENCOUNTER — Encounter (HOSPITAL_COMMUNITY): Payer: PRIVATE HEALTH INSURANCE

## 2013-05-28 VITALS — BP 106/65 | HR 92 | Temp 97.9°F | Resp 18 | Wt 144.8 lb

## 2013-05-28 DIAGNOSIS — C801 Malignant (primary) neoplasm, unspecified: Secondary | ICD-10-CM

## 2013-05-28 DIAGNOSIS — C786 Secondary malignant neoplasm of retroperitoneum and peritoneum: Secondary | ICD-10-CM

## 2013-05-28 DIAGNOSIS — L299 Pruritus, unspecified: Secondary | ICD-10-CM

## 2013-05-28 DIAGNOSIS — I776 Arteritis, unspecified: Secondary | ICD-10-CM

## 2013-05-28 DIAGNOSIS — D6481 Anemia due to antineoplastic chemotherapy: Secondary | ICD-10-CM

## 2013-05-28 DIAGNOSIS — G609 Hereditary and idiopathic neuropathy, unspecified: Secondary | ICD-10-CM

## 2013-05-28 DIAGNOSIS — T451X5A Adverse effect of antineoplastic and immunosuppressive drugs, initial encounter: Secondary | ICD-10-CM

## 2013-05-28 DIAGNOSIS — L298 Other pruritus: Secondary | ICD-10-CM

## 2013-05-28 DIAGNOSIS — Z5112 Encounter for antineoplastic immunotherapy: Secondary | ICD-10-CM

## 2013-05-28 DIAGNOSIS — L509 Urticaria, unspecified: Secondary | ICD-10-CM

## 2013-05-28 LAB — CBC WITH DIFFERENTIAL/PLATELET
Basophils Absolute: 0 10*3/uL (ref 0.0–0.1)
Basophils Relative: 0 % (ref 0–1)
EOS PCT: 3 % (ref 0–5)
Eosinophils Absolute: 0.2 10*3/uL (ref 0.0–0.7)
HEMATOCRIT: 37.3 % (ref 36.0–46.0)
Hemoglobin: 12.6 g/dL (ref 12.0–15.0)
LYMPHS ABS: 2.1 10*3/uL (ref 0.7–4.0)
Lymphocytes Relative: 28 % (ref 12–46)
MCH: 31.9 pg (ref 26.0–34.0)
MCHC: 33.8 g/dL (ref 30.0–36.0)
MCV: 94.4 fL (ref 78.0–100.0)
MONO ABS: 0.8 10*3/uL (ref 0.1–1.0)
Monocytes Relative: 10 % (ref 3–12)
Neutro Abs: 4.6 10*3/uL (ref 1.7–7.7)
Neutrophils Relative %: 60 % (ref 43–77)
Platelets: 161 10*3/uL (ref 150–400)
RBC: 3.95 MIL/uL (ref 3.87–5.11)
RDW: 14.7 % (ref 11.5–15.5)
WBC: 7.7 10*3/uL (ref 4.0–10.5)

## 2013-05-28 LAB — COMPREHENSIVE METABOLIC PANEL
ALT: 14 U/L (ref 0–35)
AST: 17 U/L (ref 0–37)
Albumin: 3.8 g/dL (ref 3.5–5.2)
Alkaline Phosphatase: 92 U/L (ref 39–117)
BUN: 23 mg/dL (ref 6–23)
CALCIUM: 9.4 mg/dL (ref 8.4–10.5)
CO2: 27 meq/L (ref 19–32)
CREATININE: 0.81 mg/dL (ref 0.50–1.10)
Chloride: 98 mEq/L (ref 96–112)
GFR calc Af Amer: 85 mL/min — ABNORMAL LOW (ref >90)
GFR, EST NON AFRICAN AMERICAN: 73 mL/min — AB (ref >90)
GLUCOSE: 108 mg/dL — AB (ref 70–99)
Potassium: 4.6 mEq/L (ref 3.7–5.3)
Sodium: 135 mEq/L — ABNORMAL LOW (ref 137–147)
Total Bilirubin: 0.3 mg/dL (ref 0.3–1.2)
Total Protein: 7.5 g/dL (ref 6.0–8.3)

## 2013-05-28 LAB — URINALYSIS, DIPSTICK ONLY
Bilirubin Urine: NEGATIVE
GLUCOSE, UA: NEGATIVE mg/dL
Ketones, ur: NEGATIVE mg/dL
Nitrite: NEGATIVE
PH: 5.5 (ref 5.0–8.0)
PROTEIN: NEGATIVE mg/dL
Specific Gravity, Urine: 1.01 (ref 1.005–1.030)
Urobilinogen, UA: 0.2 mg/dL (ref 0.0–1.0)

## 2013-05-28 MED ORDER — HEPARIN SOD (PORK) LOCK FLUSH 100 UNIT/ML IV SOLN
500.0000 [IU] | Freq: Once | INTRAVENOUS | Status: AC | PRN
  Administered 2013-05-28: 500 [IU]
  Filled 2013-05-28: qty 5

## 2013-05-28 MED ORDER — SODIUM CHLORIDE 0.9 % IJ SOLN: 10.0000 mL | INTRAMUSCULAR | Status: DC | PRN

## 2013-05-28 MED ORDER — SODIUM CHLORIDE 0.9 % IV SOLN
15.0000 mg/kg | Freq: Once | INTRAVENOUS | Status: AC
  Administered 2013-05-28: 1050 mg via INTRAVENOUS
  Filled 2013-05-28: qty 12

## 2013-05-28 MED ORDER — SODIUM CHLORIDE 0.9 % IV SOLN
Freq: Once | INTRAVENOUS | Status: AC
  Administered 2013-05-28: 11:00:00 via INTRAVENOUS

## 2013-05-28 MED ORDER — PREDNISONE 5 MG PO TABS: 5.0000 mg | ORAL_TABLET | Freq: Every day | ORAL | Status: DC

## 2013-05-28 MED ORDER — OXYCODONE HCL 10 MG PO TABS: ORAL_TABLET | ORAL | Status: DC

## 2013-05-28 MED ORDER — HYDROXYZINE HCL 10 MG PO TABS: 10.0000 mg | ORAL_TABLET | Freq: Three times a day (TID) | ORAL | Status: DC | PRN

## 2013-05-28 NOTE — Patient Instructions (Addendum)
.  [facility] Discharge Instructions  RECOMMENDATIONS MADE BY THE CONSULTANT AND ANY TEST RESULTS WILL BE SENT TO YOUR REFERRING PHYSICIAN.  EXAM FINDINGS BY THE PHYSICIAN TODAY AND SIGNS OR SYMPTOMS TO REPORT TO CLINIC OR PRIMARY PHYSICIAN:  Exam and findings as discussed by T. Sheldon Silvan PA.  MEDICATIONS PRESCRIBED:   Prednisone Take 1 tablet (5 mg total) by mouth daily with breakfast. Atarax Take 1 tablet (10 mg total) by mouth every 8 (eight) hours as needed.    INSTRUCTIONS/FOLLOW-UP: CT scans before next treatment Dr visit with next chemo  Thank you for choosing [facility] to provide your oncology and hematology care.  To afford each patient quality time with our providers, please arrive at least 15 minutes before your scheduled appointment time.  With your help, our goal is to use those 15 minutes to complete the necessary work-up to ensure our physicians have the information they need to help with your evaluation and healthcare recommendations.    Effective January 1st, 2014, we ask that you re-schedule your appointment with our physicians should you arrive 10 or more minutes late for your appointment.  We strive to give you quality time with our providers, and arriving late affects you and other patients whose appointments are after yours.    Again, thank you for choosing [facility].  Our hope is that these requests will decrease the amount of time that you wait before being seen by our physicians.       _____________________________________________________________  Should you have questions after your visit to [facility], please contact our office at [phone] between the hours of 8:30 a.m. and 5:00 p.m.  Voicemails left after 4:30 p.m. will not be returned until the following business day.  For prescription refill requests, have your pharmacy contact our office with your prescription refill request.

## 2013-05-29 LAB — CA 125: CA 125: 46.4 U/mL — ABNORMAL HIGH (ref 0.0–30.2)

## 2013-06-06 ENCOUNTER — Inpatient Hospital Stay (HOSPITAL_COMMUNITY): Payer: PRIVATE HEALTH INSURANCE

## 2013-06-12 ENCOUNTER — Ambulatory Visit (HOSPITAL_COMMUNITY)
Admission: RE | Admit: 2013-06-12 | Discharge: 2013-06-12 | Disposition: A | Discharge status: Home / Self Care | Payer: PRIVATE HEALTH INSURANCE | Source: Ambulatory Visit | Attending: Oncology | Admitting: Oncology

## 2013-06-12 DIAGNOSIS — K5730 Diverticulosis of large intestine without perforation or abscess without bleeding: Secondary | ICD-10-CM | POA: Insufficient documentation

## 2013-06-12 DIAGNOSIS — C801 Malignant (primary) neoplasm, unspecified: Secondary | ICD-10-CM | POA: Insufficient documentation

## 2013-06-12 DIAGNOSIS — N839 Noninflammatory disorder of ovary, fallopian tube and broad ligament, unspecified: Secondary | ICD-10-CM | POA: Insufficient documentation

## 2013-06-12 DIAGNOSIS — I10 Essential (primary) hypertension: Secondary | ICD-10-CM | POA: Insufficient documentation

## 2013-06-12 DIAGNOSIS — R599 Enlarged lymph nodes, unspecified: Secondary | ICD-10-CM | POA: Insufficient documentation

## 2013-06-12 MED ORDER — IOHEXOL 300 MG/ML  SOLN
100.0000 mL | Freq: Once | INTRAMUSCULAR | Status: AC | PRN
  Administered 2013-06-12: 100 mL via INTRAVENOUS

## 2013-06-14 ENCOUNTER — Encounter (HOSPITAL_COMMUNITY): Payer: Self-pay

## 2013-06-18 ENCOUNTER — Ambulatory Visit (HOSPITAL_COMMUNITY): Payer: PRIVATE HEALTH INSURANCE

## 2013-06-18 ENCOUNTER — Encounter (HOSPITAL_BASED_OUTPATIENT_CLINIC_OR_DEPARTMENT_OTHER): Payer: PRIVATE HEALTH INSURANCE

## 2013-06-18 ENCOUNTER — Encounter (HOSPITAL_COMMUNITY): Payer: PRIVATE HEALTH INSURANCE | Attending: Internal Medicine

## 2013-06-18 VITALS — BP 135/77 | HR 82 | Temp 97.3°F | Resp 18 | Wt 141.4 lb

## 2013-06-18 DIAGNOSIS — C801 Malignant (primary) neoplasm, unspecified: Secondary | ICD-10-CM

## 2013-06-18 DIAGNOSIS — G609 Hereditary and idiopathic neuropathy, unspecified: Secondary | ICD-10-CM

## 2013-06-18 DIAGNOSIS — T451X5A Adverse effect of antineoplastic and immunosuppressive drugs, initial encounter: Secondary | ICD-10-CM

## 2013-06-18 DIAGNOSIS — Z5112 Encounter for antineoplastic immunotherapy: Secondary | ICD-10-CM

## 2013-06-18 DIAGNOSIS — C786 Secondary malignant neoplasm of retroperitoneum and peritoneum: Secondary | ICD-10-CM

## 2013-06-18 DIAGNOSIS — D6481 Anemia due to antineoplastic chemotherapy: Secondary | ICD-10-CM

## 2013-06-18 DIAGNOSIS — L299 Pruritus, unspecified: Secondary | ICD-10-CM

## 2013-06-18 DIAGNOSIS — L298 Other pruritus: Secondary | ICD-10-CM

## 2013-06-18 LAB — COMPREHENSIVE METABOLIC PANEL
ALT: 13 U/L (ref 0–35)
AST: 16 U/L (ref 0–37)
Albumin: 3.7 g/dL (ref 3.5–5.2)
Alkaline Phosphatase: 73 U/L (ref 39–117)
BILIRUBIN TOTAL: 0.2 mg/dL — AB (ref 0.3–1.2)
BUN: 22 mg/dL (ref 6–23)
CHLORIDE: 103 meq/L (ref 96–112)
CO2: 26 meq/L (ref 19–32)
Calcium: 9.9 mg/dL (ref 8.4–10.5)
Creatinine, Ser: 0.64 mg/dL (ref 0.50–1.10)
GFR calc Af Amer: >90 mL/min (ref >90)
GFR calc non Af Amer: 90 mL/min — ABNORMAL LOW (ref >90)
Glucose, Bld: 111 mg/dL — ABNORMAL HIGH (ref 70–99)
Potassium: 4.7 mEq/L (ref 3.7–5.3)
SODIUM: 138 meq/L (ref 137–147)
Total Protein: 7.4 g/dL (ref 6.0–8.3)

## 2013-06-18 LAB — CBC WITH DIFFERENTIAL/PLATELET
BASOS ABS: 0 10*3/uL (ref 0.0–0.1)
Basophils Relative: 0 % (ref 0–1)
Eosinophils Absolute: 0 10*3/uL (ref 0.0–0.7)
Eosinophils Relative: 1 % (ref 0–5)
HCT: 33.5 % — ABNORMAL LOW (ref 36.0–46.0)
Hemoglobin: 11.5 g/dL — ABNORMAL LOW (ref 12.0–15.0)
LYMPHS PCT: 21 % (ref 12–46)
Lymphs Abs: 1 10*3/uL (ref 0.7–4.0)
MCH: 32.2 pg (ref 26.0–34.0)
MCHC: 34.3 g/dL (ref 30.0–36.0)
MCV: 93.8 fL (ref 78.0–100.0)
Monocytes Absolute: 0.3 10*3/uL (ref 0.1–1.0)
Monocytes Relative: 7 % (ref 3–12)
NEUTROS ABS: 3.4 10*3/uL (ref 1.7–7.7)
NEUTROS PCT: 71 % (ref 43–77)
PLATELETS: 150 10*3/uL (ref 150–400)
RBC: 3.57 MIL/uL — ABNORMAL LOW (ref 3.87–5.11)
RDW: 14.5 % (ref 11.5–15.5)
WBC: 4.7 10*3/uL (ref 4.0–10.5)

## 2013-06-18 LAB — URINALYSIS, DIPSTICK ONLY
BILIRUBIN URINE: NEGATIVE
GLUCOSE, UA: NEGATIVE mg/dL
Hgb urine dipstick: NEGATIVE
Ketones, ur: NEGATIVE mg/dL
Leukocytes, UA: NEGATIVE
Nitrite: NEGATIVE
PH: 6.5 (ref 5.0–8.0)
PROTEIN: NEGATIVE mg/dL
Specific Gravity, Urine: 1.02 (ref 1.005–1.030)
Urobilinogen, UA: 0.2 mg/dL (ref 0.0–1.0)

## 2013-06-18 MED ORDER — SODIUM CHLORIDE 0.9 % IV SOLN
Freq: Once | INTRAVENOUS | Status: AC
  Administered 2013-06-18: 10:00:00 via INTRAVENOUS

## 2013-06-18 MED ORDER — SODIUM CHLORIDE 0.9 % IJ SOLN: 10.0000 mL | INTRAMUSCULAR | Status: DC | PRN

## 2013-06-18 MED ORDER — PREDNISONE 5 MG PO TABS: ORAL_TABLET | ORAL | Status: DC

## 2013-06-18 MED ORDER — SODIUM CHLORIDE 0.9 % IV SOLN
10.0000 mg | Freq: Once | INTRAVENOUS | Status: AC
  Administered 2013-06-18: 10 mg via INTRAVENOUS
  Filled 2013-06-18: qty 1

## 2013-06-18 MED ORDER — BEVACIZUMAB CHEMO INJECTION 400 MG/16ML
15.0000 mg/kg | Freq: Once | INTRAVENOUS | Status: AC
  Administered 2013-06-18: 1050 mg via INTRAVENOUS
  Filled 2013-06-18: qty 32

## 2013-06-18 MED ORDER — HYDROXYZINE HCL 10 MG PO TABS: ORAL_TABLET | ORAL | Status: DC

## 2013-06-18 MED ORDER — FAMOTIDINE 20 MG PO TABS: ORAL_TABLET | ORAL | Status: DC

## 2013-06-18 MED ORDER — HEPARIN SOD (PORK) LOCK FLUSH 100 UNIT/ML IV SOLN
500.0000 [IU] | Freq: Once | INTRAVENOUS | Status: AC | PRN
  Administered 2013-06-18: 500 [IU]
  Filled 2013-06-18: qty 5

## 2013-06-18 MED ORDER — DEXAMETHASONE SODIUM PHOSPHATE 10 MG/ML IJ SOLN: 10.0000 mg | Freq: Once | INTRAMUSCULAR | Status: DC

## 2013-06-18 NOTE — Patient Instructions (Addendum)
We gave you a steroid(dexamethasone) in your premed today to try to lessen the itching  Check with your pharmacy on cost of atarax without insurance  No aranesp needed today. Hemoglobin is 11.7  Return in 3 weeks for treatment

## 2013-06-18 NOTE — Progress Notes (Signed)
[place]  OFFICE PROGRESS NOTE  [name], MD [address] [place] [address]  DIAGNOSIS: Pruritus - Plan: predniSONE (DELTASONE) 5 MG tablet, hydrOXYzine (ATARAX/VISTARIL) 10 MG tablet  Chief Complaint  Patient presents with  . Peritoneal carcinomatosis    Maintenance Avastin    CURRENT THERAPY: Maintenance Avastin every 3 weeks at 15 mg per kilogram.  INTERVAL HISTORY: Erika Moreno 68 y.o. 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  Patient still experiences pruritus after each treatment with Avastin. It lasts for about a week and then gradually lessens until the time of next infusion. She denies abdominal pain, diarrhea, constipation, melena, or easy, hematuria, urinary hesitancy, peripheral paresthesias, sore throat, cough, wheezing, nasal drip, skin rash that is visible, headache, or seizures.  MEDICAL HISTORY: Past Medical History  Diagnosis Date  . 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 Cervical cancer 1984  . Myocardial infarction 20 yrs ago  . Coronary artery disease   . Anxiety   . Chronic back pain   . Depression   . GERD (gastroesophageal reflux disease)   . Hypertension   . Hypothyroidism   . Anemia   . Vitamin B 12 deficiency   . Vitamin D deficiency disease     INTERIM HISTORY: has Cancer of unknown origin; Anemia due to chemotherapy; Normocytic anemia; Antineoplastic chemotherapy induced pancytopenia; Dyspnea on exertion; and Neuropathy due to chemotherapeutic drug on her problem list.    Cancer of unknown origin (peritoneal carcinomatosis) 11/15/2012 - 02/21/2013  Chemotherapy  Carboplatin/Paclitaxel  11/15/2012  Treatment Plan Change  Transfer of care to [facility] from [facility]  03/22/2013 -Remission  PET scan- CR  04/16/2013 -   Avastin maintenance every 21 days   ALLERGIES:  is allergic to morphine and related and sulfa antibiotics.  MEDICATIONS: has a current medication list which includes the following prescription(s): alprazolam, bisacodyl, clopidogrel, cyclobenzaprine, dexlansoprazole, duloxetine, famotidine, famotidine, furosemide, hydroxyzine, isosorbide mononitrate, lidocaine-prilocaine, lisinopril-hydrochlorothiazide, loratadine, metoclopramide, nitroglycerin, ondansetron, oxycodone hcl, oxymetazoline, potassium chloride sa, prednisone, sodium chloride, triamcinolone cream, and venlafaxine, and the following Facility-Administered Medications: bevacizumab (AVASTIN) 1,050 mg in sodium chloride 0.9 % 100 mL chemo infusion, dexamethasone (DECADRON) 10 mg in sodium chloride 0.9 % 50 mL IVPB, heparin lock flush, and sodium chloride.  SURGICAL HISTORY:  Past Surgical History  Procedure Laterality Date  . Heart bypass      triple bypass with stents  . Coronary artery bypass graft      3 vessels  . Cardiac catheterization      stents x3  . Abdominal hysterectomy    . Portacath placement Right 12/06/2012    Procedure: INSERTION PORT-A-CATH;  Surgeon: Donato Heinz, MD;  Location: [facility];  Service: General;  Laterality: Right;    FAMILY HISTORY: family history includes Heart attack in her father and mother.  SOCIAL HISTORY:  reports that she quit smoking about 24 years ago. Her smoking use included Cigarettes. She has a 44 pack-year smoking history. She has never used smokeless tobacco. She reports that she does not drink alcohol or use illicit drugs.  REVIEW OF SYSTEMS:  Other than that discussed above is noncontributory.  PHYSICAL EXAMINATION: ECOG PERFORMANCE STATUS: 1 - Symptomatic but completely ambulatory  There were no vitals taken for this visit.  GENERAL:alert, no distress and  comfortable SKIN: skin color, texture, turgor are normal, no rashes or significant lesions EYES: PERLA; Conjunctiva are pink  and non-injected, sclera clear OROPHARYNX:no exudate, no erythema on lips, buccal mucosa, or tongue. NECK: supple, thyroid normal size, non-tender, without nodularity. No masses CHEST: Normal AP diameter with light port in place. LYMPH:  no palpable lymphadenopathy in the cervical, axillary or inguinal LUNGS: clear to auscultation and percussion with normal breathing effort HEART: regular rate & rhythm and no murmurs. ABDOMEN:abdomen soft, non-tender and normal bowel sounds. No hepatomegaly, ascites, or CVA tenderness. MUSCULOSKELETAL:no cyanosis of digits and no clubbing. Range of motion normal.  NEURO: alert & oriented x 3 with fluent speech, no focal motor/sensory deficits   LABORATORY DATA: Infusion on 06/18/2013  Component Date Value Ref Range Status  . WBC 06/18/2013 4.7  4.0 - 10.5 K/uL Final  . RBC 06/18/2013 3.57* 3.87 - 5.11 MIL/uL Final  . Hemoglobin 06/18/2013 11.5* 12.0 - 15.0 g/dL Final  . HCT 64/61/4012 33.5* 36.0 - 46.0 % Final  . MCV 06/18/2013 93.8  78.0 - 100.0 fL Final  . MCH 06/18/2013 32.2  26.0 - 34.0 pg Final  . MCHC 06/18/2013 34.3  30.0 - 36.0 g/dL Final  . RDW 03/58/1362 14.5  11.5 - 15.5 % Final  . Platelets 06/18/2013 150  150 - 400 K/uL Final  . Neutrophils Relative % 06/18/2013 71  43 - 77 % Final  . Neutro Abs 06/18/2013 3.4  1.7 - 7.7 K/uL Final  . Lymphocytes Relative 06/18/2013 21  12 - 46 % Final  . Lymphs Abs 06/18/2013 1.0  0.7 - 4.0 K/uL Final  . Monocytes Relative 06/18/2013 7  3 - 12 % Final  . Monocytes Absolute 06/18/2013 0.3  0.1 - 1.0 K/uL Final  . Eosinophils Relative 06/18/2013 1  0 - 5 % Final  . Eosinophils Absolute 06/18/2013 0.0  0.0 - 0.7 K/uL Final  . Basophils Relative 06/18/2013 0  0 - 1 % Final  . Basophils Absolute 06/18/2013 0.0  0.0 - 0.1 K/uL Final  . Specific Gravity, Urine 06/18/2013 1.020  1.005 - 1.030 Final  . pH 06/18/2013 6.5  5.0 - 8.0 Final  . Glucose, UA 06/18/2013 NEGATIVE  NEGATIVE mg/dL Final  . Hgb urine  dipstick 06/18/2013 NEGATIVE  NEGATIVE Final  . Bilirubin Urine 06/18/2013 NEGATIVE  NEGATIVE Final  . Ketones, ur 06/18/2013 NEGATIVE  NEGATIVE mg/dL Final  . Protein, ur 52/26/1674 NEGATIVE  NEGATIVE mg/dL Final  . Urobilinogen, UA 06/18/2013 0.2  0.0 - 1.0 mg/dL Final  . Nitrite 62/82/8668 NEGATIVE  NEGATIVE Final  . Leukocytes, UA 06/18/2013 NEGATIVE  NEGATIVE Final  Infusion on 05/28/2013  Component Date Value Ref Range Status  . WBC 05/28/2013 7.7  4.0 - 10.5 K/uL Final  . RBC 05/28/2013 3.95  3.87 - 5.11 MIL/uL Final  . Hemoglobin 05/28/2013 12.6  12.0 - 15.0 g/dL Final  . HCT 93/75/9197 37.3  36.0 - 46.0 % Final  . MCV 05/28/2013 94.4  78.0 - 100.0 fL Final  . MCH 05/28/2013 31.9  26.0 - 34.0 pg Final  . MCHC 05/28/2013 33.8  30.0 - 36.0 g/dL Final  . RDW 94/39/1299 14.7  11.5 - 15.5 % Final  . Platelets 05/28/2013 161  150 - 400 K/uL Final  . Neutrophils Relative % 05/28/2013 60  43 - 77 % Final  . Neutro Abs 05/28/2013 4.6  1.7 - 7.7 K/uL Final  . Lymphocytes Relative 05/28/2013 28  12 - 46 % Final  .  Lymphs Abs 05/28/2013 2.1  0.7 - 4.0 K/uL Final  . Monocytes Relative 05/28/2013 10  3 - 12 % Final  . Monocytes Absolute 05/28/2013 0.8  0.1 - 1.0 K/uL Final  . Eosinophils Relative 05/28/2013 3  0 - 5 % Final  . Eosinophils Absolute 05/28/2013 0.2  0.0 - 0.7 K/uL Final  . Basophils Relative 05/28/2013 0  0 - 1 % Final  . Basophils Absolute 05/28/2013 0.0  0.0 - 0.1 K/uL Final  . Sodium 05/28/2013 135* 137 - 147 mEq/L Final  . Potassium 05/28/2013 4.6  3.7 - 5.3 mEq/L Final  . Chloride 05/28/2013 98  96 - 112 mEq/L Final  . CO2 05/28/2013 27  19 - 32 mEq/L Final  . Glucose, Bld 05/28/2013 108* 70 - 99 mg/dL Final  . BUN 05/28/2013 23  6 - 23 mg/dL Final  . Creatinine, Ser 05/28/2013 0.81  0.50 - 1.10 mg/dL Final  . Calcium 05/28/2013 9.4  8.4 - 10.5 mg/dL Final  . Total Protein 05/28/2013 7.5  6.0 - 8.3 g/dL Final  . Albumin 05/28/2013 3.8  3.5 - 5.2 g/dL Final  . AST  05/28/2013 17  0 - 37 U/L Final  . ALT 05/28/2013 14  0 - 35 U/L Final  . Alkaline Phosphatase 05/28/2013 92  39 - 117 U/L Final  . Total Bilirubin 05/28/2013 0.3  0.3 - 1.2 mg/dL Final  . GFR calc non Af Amer 05/28/2013 73* >90 mL/min Final  . GFR calc Af Amer 05/28/2013 85* >90 mL/min Final   Comment: (NOTE)                          The eGFR has been calculated using the CKD EPI equation.                          This calculation has not been validated in all clinical situations.                          eGFR's persistently <90 mL/min signify possible Chronic Kidney                          Disease.  [name] Specific Gravity, Urine 05/28/2013 1.010  1.005 - 1.030 Final  . pH 05/28/2013 5.5  5.0 - 8.0 Final  . Glucose, UA 05/28/2013 NEGATIVE  NEGATIVE mg/dL Final  . Hgb urine dipstick 05/28/2013 TRACE* NEGATIVE Final  . Bilirubin Urine 05/28/2013 NEGATIVE  NEGATIVE Final  . Ketones, ur 05/28/2013 NEGATIVE  NEGATIVE mg/dL Final  . Protein, ur 05/28/2013 NEGATIVE  NEGATIVE mg/dL Final  . Urobilinogen, UA 05/28/2013 0.2  0.0 - 1.0 mg/dL Final  . Nitrite 05/28/2013 NEGATIVE  NEGATIVE Final  . Leukocytes, UA 05/28/2013 SMALL* NEGATIVE Final  . CA 125 05/28/2013 46.4* 0.0 - 30.2 U/mL Final   Performed at [facility]: No new pathology.  Urinalysis    Component Value Date/Time   COLORURINE YELLOW 02/10/2013 1650   APPEARANCEUR CLOUDY* 02/10/2013 1650   LABSPEC 1.020 06/18/2013 0825   PHURINE 6.5 06/18/2013 0825   GLUCOSEU NEGATIVE 06/18/2013 0825   HGBUR NEGATIVE 06/18/2013 0825   BILIRUBINUR NEGATIVE 06/18/2013 0825   KETONESUR NEGATIVE 06/18/2013 0825   PROTEINUR NEGATIVE 06/18/2013 0825   UROBILINOGEN 0.2 06/18/2013 0825   NITRITE NEGATIVE 06/18/2013 0825   LEUKOCYTESUR NEGATIVE 06/18/2013 0825  RADIOGRAPHIC STUDIES: Ct Chest W Contrast  06/12/2013   CLINICAL DATA Cervical cancer 1984, restaging for metastatic disease of unknown primary, post chemotherapy, history  coronary artery disease, hypertension  EXAM CT CHEST, ABDOMEN, AND PELVIS WITH CONTRAST  TECHNIQUE Multidetector CT imaging of the chest, abdomen and pelvis was performed following the standard protocol during bolus administration of intravenous contrast. Sagittal and coronal MPR images reconstructed from axial data set.  CONTRAST 17mL OMNIPAQUE IOHEXOL 300 MG/ML  SOLN.  Dilute oral contrast.  COMPARISON PET-CT 03/22/2013  FINDINGS CT CHEST FINDINGS  Scattered atherosclerotic calcifications aorta and coronary arteries.  Minimally enlarged precarinal lymph node 11 mm short axis image 21, 10 mm previously.  No additional thoracic adenopathy.  Lungs clear.  No infiltrate, pleural effusion, pneumothorax or pulmonary mass/nodule.  Perihilar ground-glass opacities seen on prior PET-CT resolved.  No acute osseous findings.  CT ABDOMEN AND PELVIS FINDINGS  Liver, spleen, pancreas, kidneys, and adrenal glands normal appearance.  Scattered atherosclerotic calcifications.  Uterus surgically absent.  Large cystic structure in left pelvis, 7.4 x 4.6 cm, previously 7.6 x 4.8 cm.  Right ovary and appendix not visualized.  Few uncomplicated descending colonic diverticula.  Stomach and bowel loops otherwise normal appearance.  Normal size retroperitoneal and mesenteric lymph nodes.  Unremarkable bladder in ureters.  No additional mass, adenopathy, free fluid or inflammatory process.  Bones demineralized with scattered degenerative disc and facet disease changes of the lumbar spine.  IMPRESSION Minimally enlarged precarinal lymph node 11 mm diameter, 10 mm on previous PET-CT.  Little change in size of cystic left adnexal mass.  No new intra thoracic lower intra-abdominal abnormalities identified.  SIGNATURE  Electronically Signed   By: Lavonia Dana M.D.   On: 06/12/2013 15:30   Ct Abdomen Pelvis W Contrast  06/12/2013   CLINICAL DATA Cervical cancer 1984, restaging for metastatic disease of unknown primary, post chemotherapy,  history coronary artery disease, hypertension  EXAM CT CHEST, ABDOMEN, AND PELVIS WITH CONTRAST  TECHNIQUE Multidetector CT imaging of the chest, abdomen and pelvis was performed following the standard protocol during bolus administration of intravenous contrast. Sagittal and coronal MPR images reconstructed from axial data set.  CONTRAST 154mL OMNIPAQUE IOHEXOL 300 MG/ML  SOLN.  Dilute oral contrast.  COMPARISON PET-CT 03/22/2013  FINDINGS CT CHEST FINDINGS  Scattered atherosclerotic calcifications aorta and coronary arteries.  Minimally enlarged precarinal lymph node 11 mm short axis image 21, 10 mm previously.  No additional thoracic adenopathy.  Lungs clear.  No infiltrate, pleural effusion, pneumothorax or pulmonary mass/nodule.  Perihilar ground-glass opacities seen on prior PET-CT resolved.  No acute osseous findings.  CT ABDOMEN AND PELVIS FINDINGS  Liver, spleen, pancreas, kidneys, and adrenal glands normal appearance.  Scattered atherosclerotic calcifications.  Uterus surgically absent.  Large cystic structure in left pelvis, 7.4 x 4.6 cm, previously 7.6 x 4.8 cm.  Right ovary and appendix not visualized.  Few uncomplicated descending colonic diverticula.  Stomach and bowel loops otherwise normal appearance.  Normal size retroperitoneal and mesenteric lymph nodes.  Unremarkable bladder in ureters.  No additional mass, adenopathy, free fluid or inflammatory process.  Bones demineralized with scattered degenerative disc and facet disease changes of the lumbar spine.  IMPRESSION Minimally enlarged precarinal lymph node 11 mm diameter, 10 mm on previous PET-CT.  Little change in size of cystic left adnexal mass.  No new intra thoracic lower intra-abdominal abnormalities identified.  SIGNATURE  Electronically Signed   By: Lavonia Dana M.D.   On: 06/12/2013 15:30  ASSESSMENT: 1. Peritoneal carcinomatosis,unknown primary, with excellent response to carboplatin and Taxol, but resulting in neuropathy, PET/CT  scan demonstrates a complete remission on 03/22/2013. No definitive debulking surgery was performed. S/P 4 cycles of Carboplatin/Paclitaxel with an excellent response to therapy, but bone marrow toxicity requiring blood transfusions. Now on maintenance Avastin beginning on 04/16/2013, tolerating well.  2. Lower extremity vasculitis with subcutaneous urticaria, improved on prednisone.  3. Peripheral neuropathy, better controlled on Cymbalta 30 mg at bedtime with lower analgesic requirement.  4. Anemia secondary to chemotherapy, improved from 9.3-11.1 after Aranesp was given on 04/16/2013.  5. Pruritus following treatment, it by Atarax, prednisone, and famotidine.  PLAN:  #1. Intravenous Avastin at 15 mg per kilogram today. #2. Dexamethasone 10 mg IV prior to Avastin today and with each cycle. #3. Prednisone 20 mg day after Avastin and continue on 5 mg daily chronically. #4. Atarax 10 mg up to every 8 hours as needed for itching. #5. Continue famotidine 20 mg twice a day. #6. Followup in 3 weeks for continuation of treatment.   All questions were answered. The patient knows to call the clinic with any problems, questions or concerns. We can certainly see the patient much sooner if necessary.   I spent 25 minutes counseling the patient face to face. The total time spent in the appointment was 30 minutes.    Doroteo Bradford, MD 06/18/2013 10:21 AM

## 2013-06-18 NOTE — Progress Notes (Signed)
Tolerated well

## 2013-06-25 ENCOUNTER — Other Ambulatory Visit (HOSPITAL_COMMUNITY): Payer: Self-pay | Admitting: Hematology and Oncology

## 2013-06-25 MED ORDER — POTASSIUM CHLORIDE CRYS ER 20 MEQ PO TBCR: 20.0000 meq | EXTENDED_RELEASE_TABLET | Freq: Two times a day (BID) | ORAL | Status: DC

## 2013-06-27 ENCOUNTER — Inpatient Hospital Stay (HOSPITAL_COMMUNITY): Payer: PRIVATE HEALTH INSURANCE

## 2013-07-03 ENCOUNTER — Other Ambulatory Visit (HOSPITAL_COMMUNITY): Payer: Self-pay | Admitting: Oncology

## 2013-07-09 ENCOUNTER — Encounter (HOSPITAL_BASED_OUTPATIENT_CLINIC_OR_DEPARTMENT_OTHER): Payer: PRIVATE HEALTH INSURANCE

## 2013-07-09 ENCOUNTER — Encounter (HOSPITAL_COMMUNITY): Payer: Self-pay

## 2013-07-09 ENCOUNTER — Encounter (HOSPITAL_COMMUNITY): Payer: PRIVATE HEALTH INSURANCE | Attending: Internal Medicine

## 2013-07-09 ENCOUNTER — Encounter (HOSPITAL_COMMUNITY): Payer: PRIVATE HEALTH INSURANCE

## 2013-07-09 VITALS — BP 123/71 | HR 68

## 2013-07-09 DIAGNOSIS — L299 Pruritus, unspecified: Secondary | ICD-10-CM

## 2013-07-09 DIAGNOSIS — C801 Malignant (primary) neoplasm, unspecified: Secondary | ICD-10-CM | POA: Insufficient documentation

## 2013-07-09 DIAGNOSIS — T451X5A Adverse effect of antineoplastic and immunosuppressive drugs, initial encounter: Secondary | ICD-10-CM | POA: Insufficient documentation

## 2013-07-09 DIAGNOSIS — Z5112 Encounter for antineoplastic immunotherapy: Secondary | ICD-10-CM

## 2013-07-09 DIAGNOSIS — C786 Secondary malignant neoplasm of retroperitoneum and peritoneum: Secondary | ICD-10-CM

## 2013-07-09 LAB — URINALYSIS, DIPSTICK ONLY
BILIRUBIN URINE: NEGATIVE
Glucose, UA: NEGATIVE mg/dL
Hgb urine dipstick: NEGATIVE
KETONES UR: NEGATIVE mg/dL
Nitrite: NEGATIVE
PH: 6.5 (ref 5.0–8.0)
Protein, ur: NEGATIVE mg/dL
Specific Gravity, Urine: 1.01 (ref 1.005–1.030)
UROBILINOGEN UA: 0.2 mg/dL (ref 0.0–1.0)

## 2013-07-09 LAB — COMPREHENSIVE METABOLIC PANEL
ALBUMIN: 3.6 g/dL (ref 3.5–5.2)
ALK PHOS: 70 U/L (ref 39–117)
ALT: 16 U/L (ref 0–35)
AST: 18 U/L (ref 0–37)
BUN: 28 mg/dL — ABNORMAL HIGH (ref 6–23)
CHLORIDE: 101 meq/L (ref 96–112)
CO2: 25 mEq/L (ref 19–32)
Calcium: 9.5 mg/dL (ref 8.4–10.5)
Creatinine, Ser: 0.74 mg/dL (ref 0.50–1.10)
GFR calc Af Amer: >90 mL/min (ref >90)
GFR calc non Af Amer: 85 mL/min — ABNORMAL LOW (ref >90)
Glucose, Bld: 88 mg/dL (ref 70–99)
POTASSIUM: 5.2 meq/L (ref 3.7–5.3)
Sodium: 136 mEq/L — ABNORMAL LOW (ref 137–147)
TOTAL PROTEIN: 7.2 g/dL (ref 6.0–8.3)
Total Bilirubin: 0.2 mg/dL — ABNORMAL LOW (ref 0.3–1.2)

## 2013-07-09 LAB — CBC WITH DIFFERENTIAL/PLATELET
BASOS ABS: 0 10*3/uL (ref 0.0–0.1)
BASOS PCT: 0 % (ref 0–1)
EOS ABS: 0 10*3/uL (ref 0.0–0.7)
Eosinophils Relative: 0 % (ref 0–5)
HCT: 33.5 % — ABNORMAL LOW (ref 36.0–46.0)
Hemoglobin: 11.4 g/dL — ABNORMAL LOW (ref 12.0–15.0)
Lymphocytes Relative: 14 % (ref 12–46)
Lymphs Abs: 0.7 10*3/uL (ref 0.7–4.0)
MCH: 32 pg (ref 26.0–34.0)
MCHC: 34 g/dL (ref 30.0–36.0)
MCV: 94.1 fL (ref 78.0–100.0)
Monocytes Absolute: 0.2 10*3/uL (ref 0.1–1.0)
Monocytes Relative: 5 % (ref 3–12)
NEUTROS ABS: 4 10*3/uL (ref 1.7–7.7)
NEUTROS PCT: 81 % — AB (ref 43–77)
PLATELETS: 147 10*3/uL — AB (ref 150–400)
RBC: 3.56 MIL/uL — ABNORMAL LOW (ref 3.87–5.11)
RDW: 14.6 % (ref 11.5–15.5)
WBC: 4.9 10*3/uL (ref 4.0–10.5)

## 2013-07-09 MED ORDER — PREDNISONE 5 MG PO TABS: ORAL_TABLET | ORAL | Status: DC

## 2013-07-09 MED ORDER — SODIUM CHLORIDE 0.9 % IJ SOLN: 10.0000 mL | INTRAMUSCULAR | Status: DC | PRN

## 2013-07-09 MED ORDER — SODIUM CHLORIDE 0.9 % IV SOLN
15.0000 mg/kg | Freq: Once | INTRAVENOUS | Status: AC
  Administered 2013-07-09: 1050 mg via INTRAVENOUS
  Filled 2013-07-09: qty 32

## 2013-07-09 MED ORDER — SODIUM CHLORIDE 0.9 % IV SOLN
Freq: Once | INTRAVENOUS | Status: AC
  Administered 2013-07-09: 09:00:00 via INTRAVENOUS

## 2013-07-09 MED ORDER — SODIUM CHLORIDE 0.9 % IV SOLN
10.0000 mg | Freq: Once | INTRAVENOUS | Status: AC
  Administered 2013-07-09: 10 mg via INTRAVENOUS
  Filled 2013-07-09: qty 1

## 2013-07-09 MED ORDER — HEPARIN SOD (PORK) LOCK FLUSH 100 UNIT/ML IV SOLN
500.0000 [IU] | Freq: Once | INTRAVENOUS | Status: AC | PRN
  Administered 2013-07-09: 500 [IU]
  Filled 2013-07-09: qty 5

## 2013-07-09 MED ORDER — OXYCODONE HCL 10 MG PO TABS: ORAL_TABLET | ORAL | Status: DC

## 2013-07-09 MED ORDER — DEXAMETHASONE SODIUM PHOSPHATE 10 MG/ML IJ SOLN: 10.0000 mg | Freq: Once | INTRAMUSCULAR | Status: DC

## 2013-07-09 NOTE — Progress Notes (Signed)
Tolerated well

## 2013-07-09 NOTE — Progress Notes (Signed)
[facility]  OFFICE PROGRESS NOTE  [name], MD [place] [address]  DIAGNOSIS: Peritoneal carcinomatosis  Pruritus - Plan: predniSONE (DELTASONE) 5 MG tablet  Cancer of unknown origin - Plan: Oxycodone HCl 10 MG TABS  Chief Complaint  Patient presents with  . Peritoneal carcinomatosis    CURRENT THERAPY: Maintenance Avastin 15 mg per kilogram every 3 weeks started on 04/16/2013 preceded by 6 cycles of carboplatin and Taxol, the first 2 cycles given on a weekly schedule followed by 4 cycles given at full dose every 3 weeks.  INTERVAL HISTORY: Erika Moreno 68 y.o. female returns for followup and continuation of maintenance therapy for primary peritoneal carcinomatosis with maintenance Avastin every 3 weeks started on 04/16/2013. Most recent CT scan done 06/12/2013 showed stable disease except for a precarinal lymph node which appeared to be 1 mm larger than the finding on previous PET scan done on 03/22/2013 at which time there was no demonstrated hypermetabolic activity. She is concerned about a posterior cervical lymph node that appears to be enlarged. It is asymptomatic. She's had posterior nasal drip as well as anterior right nose with UTI's. She denies any fever, night sweats, abdominal pain, earache, but has had hoarseness of voice without worsening diarrhea, or lower extremity swelling or redness. She denies any chest pain, PND, orthopnea, palpitations, skin rash, and pruritus has improved dramatically since being on low dose prednisone plus bolus the day after each treatment with Avastin.  MEDICAL HISTORY: Past Medical History  Diagnosis Date  . 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 Cervical cancer 1984  . Myocardial infarction 20 yrs ago  . Coronary artery disease   . Anxiety   . Chronic back pain   . Depression   . GERD (gastroesophageal reflux disease)   . Hypertension   .  Hypothyroidism   . Anemia   . Vitamin B 12 deficiency   . Vitamin D deficiency disease     INTERIM HISTORY: has Cancer of unknown origin; Anemia due to chemotherapy; Normocytic anemia; Antineoplastic chemotherapy induced pancytopenia; Dyspnea on exertion; and Neuropathy due to chemotherapeutic drug on her problem list.   Cancer of unknown origin (peritoneal carcinomatosis)  11/15/2012 - 02/21/2013( left cervical lymph node biopsy to make the diagnosis) Chemotherapy  Carboplatin/Paclitaxel weekly x2 cycles followed by full dose for 4 cycles resulting in a complete response  11/15/2012  Treatment Plan Change  Transfer of care to [facility] from [facility]  03/22/2013 -Remission  PET scan- CR  04/16/2013 - Avastin maintenance every 21 days   ALLERGIES:  is allergic to morphine and related and sulfa antibiotics.  MEDICATIONS: has a current medication list which includes the following prescription(s): alprazolam, bisacodyl, clopidogrel, cyclobenzaprine, dexlansoprazole, duloxetine, famotidine, furosemide, hydroxyzine, isosorbide mononitrate, lidocaine-prilocaine, lisinopril-hydrochlorothiazide, loratadine, metoclopramide, nitroglycerin, ondansetron, oxycodone hcl, oxymetazoline, potassium chloride sa, prednisone, sodium chloride, triamcinolone cream, and venlafaxine, and the following Facility-Administered Medications: bevacizumab (AVASTIN) 1,050 mg in sodium chloride 0.9 % 100 mL chemo infusion, dexamethasone (DECADRON) 10 mg in sodium chloride 0.9 % 50 mL IVPB, heparin lock flush, and sodium chloride.  SURGICAL HISTORY:  Past Surgical History  Procedure Laterality Date  . Heart bypass      triple bypass with stents  . Coronary artery bypass graft      3 vessels  . Cardiac catheterization      stents x3  .  Abdominal hysterectomy    . Portacath placement Right 12/06/2012    Procedure: INSERTION PORT-A-CATH;  Surgeon: Donato Heinz, MD;  Location: [facility];   Service: General;  Laterality: Right;    FAMILY HISTORY: family history includes Heart attack in her father and mother.  SOCIAL HISTORY:  reports that she quit smoking about 24 years ago. Her smoking use included Cigarettes. She has a 44 pack-year smoking history. She has never used smokeless tobacco. She reports that she does not drink alcohol or use illicit drugs.  REVIEW OF SYSTEMS:  Other than that discussed above is noncontributory.  PHYSICAL EXAMINATION: ECOG PERFORMANCE STATUS: 1 - Symptomatic but completely ambulatory  There were no vitals taken for this visit.  GENERAL:alert, no distress and comfortable SKIN: skin color, texture, turgor are normal, no rashes or significant lesions EYES: PERLA; Conjunctiva are pink and non-injected, sclera clear SINUSES: No redness or tenderness over maxillary or ethmoid sinuses. Bilateral rhinorrhea OROPHARYNX:no exudate, no erythema on lips, buccal mucosa, or tongue. NECK: supple, thyroid normal size, non-tender, without nodularity. No masses CHEST: Normal AP diameter with no gynecomastia. LifePort in place. LYMPH:  Small posterior cervical lymph node it is nontender and not fixed. Measures 1 cm in size. LUNGS: clear to auscultation and percussion with normal breathing effort HEART: regular rate & rhythm and no murmurs. ABDOMEN:abdomen soft, non-tender and normal bowel sounds MUSCULOSKELETAL:no cyanosis of digits and no clubbing. Range of motion normal.  NEURO: alert & oriented x 3 with fluent speech, no focal motor/sensory deficits   LABORATORY DATA: Infusion on 07/09/2013  Component Date Value Ref Range Status  . Specific Gravity, Urine 07/09/2013 1.010  1.005 - 1.030 Final  . pH 07/09/2013 6.5  5.0 - 8.0 Final  . Glucose, UA 07/09/2013 NEGATIVE  NEGATIVE mg/dL Final  . Hgb urine dipstick 07/09/2013 NEGATIVE  NEGATIVE Final  . Bilirubin Urine 07/09/2013 NEGATIVE  NEGATIVE Final  . Ketones, ur 07/09/2013 NEGATIVE  NEGATIVE mg/dL Final    . Protein, ur 07/09/2013 NEGATIVE  NEGATIVE mg/dL Final  . Urobilinogen, UA 07/09/2013 0.2  0.0 - 1.0 mg/dL Final  . Nitrite 07/09/2013 NEGATIVE  NEGATIVE Final  . Leukocytes, UA 07/09/2013 SMALL* NEGATIVE Final  . WBC 07/09/2013 4.9  4.0 - 10.5 K/uL Final  . RBC 07/09/2013 3.56* 3.87 - 5.11 MIL/uL Final  . Hemoglobin 07/09/2013 11.4* 12.0 - 15.0 g/dL Final  . HCT 07/09/2013 33.5* 36.0 - 46.0 % Final  . MCV 07/09/2013 94.1  78.0 - 100.0 fL Final  . MCH 07/09/2013 32.0  26.0 - 34.0 pg Final  . MCHC 07/09/2013 34.0  30.0 - 36.0 g/dL Final  . RDW 07/09/2013 14.6  11.5 - 15.5 % Final  . Platelets 07/09/2013 147* 150 - 400 K/uL Final  . Neutrophils Relative % 07/09/2013 81* 43 - 77 % Final  . Neutro Abs 07/09/2013 4.0  1.7 - 7.7 K/uL Final  . Lymphocytes Relative 07/09/2013 14  12 - 46 % Final  . Lymphs Abs 07/09/2013 0.7  0.7 - 4.0 K/uL Final  . Monocytes Relative 07/09/2013 5  3 - 12 % Final  . Monocytes Absolute 07/09/2013 0.2  0.1 - 1.0 K/uL Final  . Eosinophils Relative 07/09/2013 0  0 - 5 % Final  . Eosinophils Absolute 07/09/2013 0.0  0.0 - 0.7 K/uL Final  . Basophils Relative 07/09/2013 0  0 - 1 % Final  . Basophils Absolute 07/09/2013 0.0  0.0 - 0.1 K/uL Final  . Sodium 07/09/2013 136* 137 - 147 mEq/L  Final  . Potassium 07/09/2013 5.2  3.7 - 5.3 mEq/L Final  . Chloride 07/09/2013 101  96 - 112 mEq/L Final  . CO2 07/09/2013 25  19 - 32 mEq/L Final  . Glucose, Bld 07/09/2013 88  70 - 99 mg/dL Final  . BUN 07/09/2013 28* 6 - 23 mg/dL Final  . Creatinine, Ser 07/09/2013 0.74  0.50 - 1.10 mg/dL Final  . Calcium 07/09/2013 9.5  8.4 - 10.5 mg/dL Final  . Total Protein 07/09/2013 7.2  6.0 - 8.3 g/dL Final  . Albumin 07/09/2013 3.6  3.5 - 5.2 g/dL Final  . AST 07/09/2013 18  0 - 37 U/L Final  . ALT 07/09/2013 16  0 - 35 U/L Final  . Alkaline Phosphatase 07/09/2013 70  39 - 117 U/L Final  . Total Bilirubin 07/09/2013 0.2* 0.3 - 1.2 mg/dL Final  . GFR calc non Af Amer 07/09/2013 85*  >90 mL/min Final  . GFR calc Af Amer 07/09/2013 >90  >90 mL/min Final   Comment: (NOTE)                          The eGFR has been calculated using the CKD EPI equation.                          This calculation has not been validated in all clinical situations.                          eGFR's persistently <90 mL/min signify possible Chronic Kidney                          Disease.  Infusion on 06/18/2013  Component Date Value Ref Range Status  . WBC 06/18/2013 4.7  4.0 - 10.5 K/uL Final  . RBC 06/18/2013 3.57* 3.87 - 5.11 MIL/uL Final  . Hemoglobin 06/18/2013 11.5* 12.0 - 15.0 g/dL Final  . HCT 06/18/2013 33.5* 36.0 - 46.0 % Final  . MCV 06/18/2013 93.8  78.0 - 100.0 fL Final  . MCH 06/18/2013 32.2  26.0 - 34.0 pg Final  . MCHC 06/18/2013 34.3  30.0 - 36.0 g/dL Final  . RDW 06/18/2013 14.5  11.5 - 15.5 % Final  . Platelets 06/18/2013 150  150 - 400 K/uL Final  . Neutrophils Relative % 06/18/2013 71  43 - 77 % Final  . Neutro Abs 06/18/2013 3.4  1.7 - 7.7 K/uL Final  . Lymphocytes Relative 06/18/2013 21  12 - 46 % Final  . Lymphs Abs 06/18/2013 1.0  0.7 - 4.0 K/uL Final  . Monocytes Relative 06/18/2013 7  3 - 12 % Final  . Monocytes Absolute 06/18/2013 0.3  0.1 - 1.0 K/uL Final  . Eosinophils Relative 06/18/2013 1  0 - 5 % Final  . Eosinophils Absolute 06/18/2013 0.0  0.0 - 0.7 K/uL Final  . Basophils Relative 06/18/2013 0  0 - 1 % Final  . Basophils Absolute 06/18/2013 0.0  0.0 - 0.1 K/uL Final  . Sodium 06/18/2013 138  137 - 147 mEq/L Final  . Potassium 06/18/2013 4.7  3.7 - 5.3 mEq/L Final  . Chloride 06/18/2013 103  96 - 112 mEq/L Final  . CO2 06/18/2013 26  19 - 32 mEq/L Final  . Glucose, Bld 06/18/2013 111* 70 - 99 mg/dL Final  . BUN 06/18/2013 22  6 - 23 mg/dL Final  .  Creatinine, Ser 06/18/2013 0.64  0.50 - 1.10 mg/dL Final  . Calcium 06/18/2013 9.9  8.4 - 10.5 mg/dL Final  . Total Protein 06/18/2013 7.4  6.0 - 8.3 g/dL Final  . Albumin 06/18/2013 3.7  3.5 - 5.2 g/dL  Final  . AST 06/18/2013 16  0 - 37 U/L Final  . ALT 06/18/2013 13  0 - 35 U/L Final  . Alkaline Phosphatase 06/18/2013 73  39 - 117 U/L Final  . Total Bilirubin 06/18/2013 0.2* 0.3 - 1.2 mg/dL Final  . GFR calc non Af Amer 06/18/2013 90* >90 mL/min Final  . GFR calc Af Amer 06/18/2013 >90  >90 mL/min Final   Comment: (NOTE)                          The eGFR has been calculated using the CKD EPI equation.                          This calculation has not been validated in all clinical situations.                          eGFR's persistently <90 mL/min signify possible Chronic Kidney                          Disease.  [name] Specific Gravity, Urine 06/18/2013 1.020  1.005 - 1.030 Final  . pH 06/18/2013 6.5  5.0 - 8.0 Final  . Glucose, UA 06/18/2013 NEGATIVE  NEGATIVE mg/dL Final  . Hgb urine dipstick 06/18/2013 NEGATIVE  NEGATIVE Final  . Bilirubin Urine 06/18/2013 NEGATIVE  NEGATIVE Final  . Ketones, ur 06/18/2013 NEGATIVE  NEGATIVE mg/dL Final  . Protein, ur 06/18/2013 NEGATIVE  NEGATIVE mg/dL Final  . Urobilinogen, UA 06/18/2013 0.2  0.0 - 1.0 mg/dL Final  . Nitrite 06/18/2013 NEGATIVE  NEGATIVE Final  . Leukocytes, UA 06/18/2013 NEGATIVE  NEGATIVE Final    PATHOLOGY: No new pathology.  Urinalysis    Component Value Date/Time   COLORURINE YELLOW 02/10/2013 1650   APPEARANCEUR CLOUDY* 02/10/2013 1650   LABSPEC 1.010 07/09/2013 0826   PHURINE 6.5 07/09/2013 0826   GLUCOSEU NEGATIVE 07/09/2013 0826   HGBUR NEGATIVE 07/09/2013 0826   BILIRUBINUR NEGATIVE 07/09/2013 0826   KETONESUR NEGATIVE 07/09/2013 0826   PROTEINUR NEGATIVE 07/09/2013 0826   UROBILINOGEN 0.2 07/09/2013 0826   NITRITE NEGATIVE 07/09/2013 0826   LEUKOCYTESUR SMALL* 07/09/2013 0826    RADIOGRAPHIC STUDIES: Ct Chest W Contrast  06/12/2013   CLINICAL DATA Cervical cancer 1984, restaging for metastatic disease of unknown primary, post chemotherapy, history coronary artery disease, hypertension  EXAM CT CHEST, ABDOMEN, AND PELVIS WITH  CONTRAST  TECHNIQUE Multidetector CT imaging of the chest, abdomen and pelvis was performed following the standard protocol during bolus administration of intravenous contrast. Sagittal and coronal MPR images reconstructed from axial data set.  CONTRAST 176mL OMNIPAQUE IOHEXOL 300 MG/ML  SOLN.  Dilute oral contrast.  COMPARISON PET-CT 03/22/2013  FINDINGS CT CHEST FINDINGS  Scattered atherosclerotic calcifications aorta and coronary arteries.  Minimally enlarged precarinal lymph node 11 mm short axis image 21, 10 mm previously.  No additional thoracic adenopathy.  Lungs clear.  No infiltrate, pleural effusion, pneumothorax or pulmonary mass/nodule.  Perihilar ground-glass opacities seen on prior PET-CT resolved.  No acute osseous findings.  CT ABDOMEN AND PELVIS FINDINGS  Liver, spleen, pancreas, kidneys, and adrenal glands normal  appearance.  Scattered atherosclerotic calcifications.  Uterus surgically absent.  Large cystic structure in left pelvis, 7.4 x 4.6 cm, previously 7.6 x 4.8 cm.  Right ovary and appendix not visualized.  Few uncomplicated descending colonic diverticula.  Stomach and bowel loops otherwise normal appearance.  Normal size retroperitoneal and mesenteric lymph nodes.  Unremarkable bladder in ureters.  No additional mass, adenopathy, free fluid or inflammatory process.  Bones demineralized with scattered degenerative disc and facet disease changes of the lumbar spine.  IMPRESSION Minimally enlarged precarinal lymph node 11 mm diameter, 10 mm on previous PET-CT.  Little change in size of cystic left adnexal mass.  No new intra thoracic lower intra-abdominal abnormalities identified.  SIGNATURE  Electronically Signed   By: Lavonia Dana M.D.   On: 06/12/2013 15:30   Ct Abdomen Pelvis W Contrast  06/12/2013   CLINICAL DATA Cervical cancer 1984, restaging for metastatic disease of unknown primary, post chemotherapy, history coronary artery disease, hypertension  EXAM CT CHEST, ABDOMEN, AND PELVIS  WITH CONTRAST  TECHNIQUE Multidetector CT imaging of the chest, abdomen and pelvis was performed following the standard protocol during bolus administration of intravenous contrast. Sagittal and coronal MPR images reconstructed from axial data set.  CONTRAST 17mL OMNIPAQUE IOHEXOL 300 MG/ML  SOLN.  Dilute oral contrast.  COMPARISON PET-CT 03/22/2013  FINDINGS CT CHEST FINDINGS  Scattered atherosclerotic calcifications aorta and coronary arteries.  Minimally enlarged precarinal lymph node 11 mm short axis image 21, 10 mm previously.  No additional thoracic adenopathy.  Lungs clear.  No infiltrate, pleural effusion, pneumothorax or pulmonary mass/nodule.  Perihilar ground-glass opacities seen on prior PET-CT resolved.  No acute osseous findings.  CT ABDOMEN AND PELVIS FINDINGS  Liver, spleen, pancreas, kidneys, and adrenal glands normal appearance.  Scattered atherosclerotic calcifications.  Uterus surgically absent.  Large cystic structure in left pelvis, 7.4 x 4.6 cm, previously 7.6 x 4.8 cm.  Right ovary and appendix not visualized.  Few uncomplicated descending colonic diverticula.  Stomach and bowel loops otherwise normal appearance.  Normal size retroperitoneal and mesenteric lymph nodes.  Unremarkable bladder in ureters.  No additional mass, adenopathy, free fluid or inflammatory process.  Bones demineralized with scattered degenerative disc and facet disease changes of the lumbar spine.  IMPRESSION Minimally enlarged precarinal lymph node 11 mm diameter, 10 mm on previous PET-CT.  Little change in size of cystic left adnexal mass.  No new intra thoracic lower intra-abdominal abnormalities identified.  SIGNATURE  Electronically Signed   By: Lavonia Dana M.D.   On: 06/12/2013 15:30    ASSESSMENT:  1. Peritoneal carcinomatosis,unknown primary, with excellent response to carboplatin and Taxol, but resulting in neuropathy, PET/CT scan demonstrates a complete remission on 03/22/2013. No definitive debulking  surgery was performed. S/P 4 cycles of Carboplatin/Paclitaxel with an excellent response to therapy, but bone marrow toxicity requiring blood transfusions. Now on maintenance Avastin beginning on 04/16/2013, tolerating well except for pruritus which is now better controlled using low dose prednisone. 2. Lower extremity vasculitis with subcutaneous urticaria, improved on prednisone.  3. Peripheral neuropathy, better controlled on Cymbalta 30 mg at bedtime with lower analgesic requirement.  4. Anemia secondary to chemotherapy, improved from 9.3-11.1 after Aranesp was given on 04/16/2013.  5. Pruritus following treatment, it by Atarax, prednisone, and famotidine 6. Symptomatic allergic rhinitis with reactive left posterior cervical lymphadenopathy.    PLAN:  #1. Intravenous Avastin at 15 mg per kilogram today.  #2. Dexamethasone 10 mg IV prior to Avastin today and with each cycle.  #3.  Prednisone 20 mg day after Avastin and continue on 5 mg daily chronically.  #4. Atarax 10 mg up to every 8 hours as needed for itching. Continue nasal spray which she has at home for symptomatic rhinitis. #5. Continue famotidine 20 mg twice a day.  #6. Followup in 3 weeks for continuation of treatment    All questions were answered. The patient knows to call the clinic with any problems, questions or concerns. We can certainly see the patient much sooner if necessary.   I spent 25 minutes counseling the patient face to face. The total time spent in the appointment was 30 minutes.    Doroteo Bradford, MD 07/09/2013 9:28 AM

## 2013-07-09 NOTE — Patient Instructions (Signed)
[facility] Discharge Instructions for Patients Receiving Chemotherapy  Today you received the following chemotherapy agents Avastin. Take Prednisone 20 mg tomorrow, then continue Prednisone 5 mg daily until next visit here. A refill for Prednisone has been sent to your pharmacy. A Jyren Cerasoli prescription for Oxycodone was given to you today. Take Imodium as needed for diarrhea. Take Atarax as needed for itching. The "knot" you feel in your neck is a cervical node that is inflamed from your post nasal drip. Return to clinic in 3 weeks for Avastin and to see MD again. Report any issues/concerns to clinic as needed prior to appointments.   If you develop nausea and vomiting that is not controlled by your nausea medication, call the clinic. If it is after clinic hours your family physician or the after hours number for the clinic or go to the Emergency Department.   BELOW ARE SYMPTOMS THAT SHOULD BE REPORTED IMMEDIATELY:  *FEVER GREATER THAN 101.0 F  *CHILLS WITH OR WITHOUT FEVER  NAUSEA AND VOMITING THAT IS NOT CONTROLLED WITH YOUR NAUSEA MEDICATION  *UNUSUAL SHORTNESS OF BREATH  *UNUSUAL BRUISING OR BLEEDING  TENDERNESS IN MOUTH AND THROAT WITH OR WITHOUT PRESENCE OF ULCERS  *URINARY PROBLEMS  *BOWEL PROBLEMS  UNUSUAL RASH Items with * indicate a potential emergency and should be followed up as soon as possible.  One of the nurses will contact you 24 hours after your treatment. Please let the nurse know about any problems that you may have experienced. Feel free to call the clinic you have any questions or concerns. The clinic phone number is [phone].   I have been informed and understand all the instructions given to me. I know to contact the clinic, my physician, or go to the Emergency Department if any problems should occur. I do not have any questions at this time, but understand that I may call the clinic during office hours or the Patient Navigator at [phone] should I have any questions or need assistance in obtaining follow up care.    __________________________________________  _____________  __________ Signature of Patient or Authorized Representative            Date                   Time    __________________________________________ Nurse's Signature

## 2013-07-18 ENCOUNTER — Other Ambulatory Visit (HOSPITAL_COMMUNITY): Payer: Self-pay | Admitting: Hematology and Oncology

## 2013-07-18 MED ORDER — METOCLOPRAMIDE HCL 5 MG PO TABS: 5.0000 mg | ORAL_TABLET | Freq: Four times a day (QID) | ORAL | Status: DC

## 2013-07-30 ENCOUNTER — Encounter (HOSPITAL_BASED_OUTPATIENT_CLINIC_OR_DEPARTMENT_OTHER): Payer: PRIVATE HEALTH INSURANCE

## 2013-07-30 ENCOUNTER — Encounter (HOSPITAL_COMMUNITY): Payer: Self-pay

## 2013-07-30 ENCOUNTER — Other Ambulatory Visit (HOSPITAL_COMMUNITY): Payer: Self-pay | Admitting: Hematology and Oncology

## 2013-07-30 VITALS — BP 129/67 | HR 75 | Temp 98.4°F | Resp 18 | Wt 144.1 lb

## 2013-07-30 DIAGNOSIS — T451X5A Adverse effect of antineoplastic and immunosuppressive drugs, initial encounter: Secondary | ICD-10-CM

## 2013-07-30 DIAGNOSIS — Z5112 Encounter for antineoplastic immunotherapy: Secondary | ICD-10-CM

## 2013-07-30 DIAGNOSIS — C801 Malignant (primary) neoplasm, unspecified: Secondary | ICD-10-CM

## 2013-07-30 DIAGNOSIS — R251 Tremor, unspecified: Secondary | ICD-10-CM

## 2013-07-30 DIAGNOSIS — C786 Secondary malignant neoplasm of retroperitoneum and peritoneum: Secondary | ICD-10-CM

## 2013-07-30 DIAGNOSIS — D6481 Anemia due to antineoplastic chemotherapy: Secondary | ICD-10-CM

## 2013-07-30 LAB — URINALYSIS, DIPSTICK ONLY
Bilirubin Urine: NEGATIVE
Glucose, UA: NEGATIVE mg/dL
HGB URINE DIPSTICK: NEGATIVE
Ketones, ur: NEGATIVE mg/dL
Nitrite: NEGATIVE
PH: 6 (ref 5.0–8.0)
Protein, ur: NEGATIVE mg/dL
SPECIFIC GRAVITY, URINE: 1.015 (ref 1.005–1.030)
Urobilinogen, UA: 0.2 mg/dL (ref 0.0–1.0)

## 2013-07-30 LAB — CBC WITH DIFFERENTIAL/PLATELET
Basophils Absolute: 0 10*3/uL (ref 0.0–0.1)
Basophils Relative: 0 % (ref 0–1)
EOS ABS: 0 10*3/uL (ref 0.0–0.7)
EOS PCT: 1 % (ref 0–5)
HCT: 31.7 % — ABNORMAL LOW (ref 36.0–46.0)
HEMOGLOBIN: 10.9 g/dL — AB (ref 12.0–15.0)
LYMPHS ABS: 1.1 10*3/uL (ref 0.7–4.0)
Lymphocytes Relative: 25 % (ref 12–46)
MCH: 32.5 pg (ref 26.0–34.0)
MCHC: 34.4 g/dL (ref 30.0–36.0)
MCV: 94.6 fL (ref 78.0–100.0)
MONOS PCT: 7 % (ref 3–12)
Monocytes Absolute: 0.3 10*3/uL (ref 0.1–1.0)
NEUTROS PCT: 67 % (ref 43–77)
Neutro Abs: 3.1 10*3/uL (ref 1.7–7.7)
Platelets: 176 10*3/uL (ref 150–400)
RBC: 3.35 MIL/uL — AB (ref 3.87–5.11)
RDW: 14.9 % (ref 11.5–15.5)
WBC: 4.6 10*3/uL (ref 4.0–10.5)

## 2013-07-30 LAB — COMPREHENSIVE METABOLIC PANEL
ALT: 13 U/L (ref 0–35)
AST: 18 U/L (ref 0–37)
Albumin: 3.7 g/dL (ref 3.5–5.2)
Alkaline Phosphatase: 65 U/L (ref 39–117)
BUN: 24 mg/dL — ABNORMAL HIGH (ref 6–23)
CO2: 27 meq/L (ref 19–32)
Calcium: 9.4 mg/dL (ref 8.4–10.5)
Chloride: 99 mEq/L (ref 96–112)
Creatinine, Ser: 0.98 mg/dL (ref 0.50–1.10)
GFR calc Af Amer: 67 mL/min — ABNORMAL LOW (ref >90)
GFR calc non Af Amer: 58 mL/min — ABNORMAL LOW (ref >90)
GLUCOSE: 90 mg/dL (ref 70–99)
POTASSIUM: 5.2 meq/L (ref 3.7–5.3)
Sodium: 137 mEq/L (ref 137–147)
TOTAL PROTEIN: 7 g/dL (ref 6.0–8.3)
Total Bilirubin: 0.2 mg/dL — ABNORMAL LOW (ref 0.3–1.2)

## 2013-07-30 MED ORDER — DEXAMETHASONE SODIUM PHOSPHATE 10 MG/ML IJ SOLN: 10.0000 mg | Freq: Once | INTRAMUSCULAR | Status: DC

## 2013-07-30 MED ORDER — HEPARIN SOD (PORK) LOCK FLUSH 100 UNIT/ML IV SOLN
500.0000 [IU] | Freq: Once | INTRAVENOUS | Status: AC | PRN
  Administered 2013-07-30: 500 [IU]

## 2013-07-30 MED ORDER — SODIUM CHLORIDE 0.9 % IV SOLN
Freq: Once | INTRAVENOUS | Status: AC
Start: 2013-07-30 — End: 2013-07-30
  Administered 2013-07-30: 10:00:00 via INTRAVENOUS

## 2013-07-30 MED ORDER — HEPARIN SOD (PORK) LOCK FLUSH 100 UNIT/ML IV SOLN
INTRAVENOUS | Status: AC
  Filled 2013-07-30: qty 5

## 2013-07-30 MED ORDER — DULOXETINE HCL 30 MG PO CPEP: 30.0000 mg | ORAL_CAPSULE | Freq: Every day | ORAL | Status: AC

## 2013-07-30 MED ORDER — SODIUM CHLORIDE 0.9 % IV SOLN
15.0000 mg/kg | Freq: Once | INTRAVENOUS | Status: AC
  Administered 2013-07-30: 1050 mg via INTRAVENOUS
  Filled 2013-07-30: qty 42

## 2013-07-30 MED ORDER — SODIUM CHLORIDE 0.9 % IV SOLN
10.0000 mg | Freq: Once | INTRAVENOUS | Status: AC
  Administered 2013-07-30: 10 mg via INTRAVENOUS
  Filled 2013-07-30: qty 1

## 2013-07-30 MED ORDER — SODIUM CHLORIDE 0.9 % IJ SOLN: 10.0000 mL | INTRAMUSCULAR | Status: DC | PRN

## 2013-07-30 NOTE — Patient Instructions (Signed)
[facility]  Discharge Instructions  RECOMMENDATIONS MADE BY THE CONSULTANT AND ANY TEST RESULTS WILL BE SENT TO YOUR REFERRING PHYSICIAN.  Stop taking the Reglan (Metaclopramide) it is causing the tremors in your hands.  You may take Alka-Seltzer for your heartburn.  You will see the doctor in 3 weeks and we will do the same labs at that time.  Thank you for choosing [facility] to provide your oncology and hematology care. To afford each patient quality time with our providers, please arrive at least 15 minutes before your scheduled appointment time. With your help, our goal is to use those 15 minutes to complete the necessary work-up to ensure our physicians have the information they need to help with your evaluation and healthcare recommendations.  Effective January 1st, 2014, we ask that you re-schedule your appointment with our physicians should you arrive 10 or more minutes late for your appointment. We strive to give you quality time with our providers, and arriving late affects you and other patients whose appointments are after yours.  Again, thank you for choosing [facility]. Our hope is that these requests will decrease the amount of time that you wait before being seen by our physicians.  _____________________________________________________________  Should you have questions after your visit to [facility], please contact our office at [phone] between the hours of 8:30 a.m. and 5:00 p.m. Voicemails left after 4:30 p.m. will not be returned until the following business day. For prescription refill requests, have your pharmacy contact our office with your prescription refill request.

## 2013-07-30 NOTE — Progress Notes (Signed)
Tolerated well

## 2013-07-30 NOTE — Progress Notes (Signed)
[place]  OFFICE PROGRESS NOTE  [name], MD [place] [id number]  DIAGNOSIS: Peritoneal carcinomatosis  Occasional tremors  Chief Complaint  Patient presents with  . Ovarian Cancer    CURRENT THERAPY: Avastin maintenance start 04/16/2013  INTERVAL HISTORY: Erika Moreno 68 y.o. female returns for continuation of maintenance Avastin every 3 weeks begun on 04/16/2013.  She is also increasing shortness of breath on mild exertion. She has also noticed intermittent tremors. After eating she does develop midepigastric discomfort which is remarkably relieved by cells. She continues on famotidine twice a day and is taking Reglan 4 times a day. She denies any fever, night sweats, and does not get hives following each treatment since premedication and taking prednisone the day after. She denies any fever, melena, hematochezia, sore mouth, dysuria, vaginal discharge or itching, lower 70 swelling or redness, skin rash, headache, or seizures. She intends to visit her grandchildren in [place] tomorrow.  MEDICAL HISTORY: Past Medical History  Diagnosis Date  . 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 Cervical cancer 1984  . Myocardial infarction 20 yrs ago  . Coronary artery disease   . Anxiety   . Chronic back pain   . Depression   . GERD (gastroesophageal reflux disease)   . Hypertension   . Hypothyroidism   . Anemia   . Vitamin B 12 deficiency   . Vitamin D deficiency disease     INTERIM HISTORY: has Cancer of unknown origin; Anemia due to chemotherapy; Normocytic anemia; Antineoplastic chemotherapy induced pancytopenia; Dyspnea on exertion; and Neuropathy due to chemotherapeutic drug on her problem list.   Cancer of unknown origin (peritoneal carcinomatosis)  11/15/2012 - 02/21/2013( left cervical lymph node biopsy to make the diagnosis)  Chemotherapy  Carboplatin/Paclitaxel weekly x2  cycles followed by full dose for 4 cycles resulting in a complete response  11/15/2012  Treatment Plan Change  Transfer of care to [facility] from [facility]  03/22/2013 -Remission  PET scan- CR  04/16/2013 - Avastin maintenance every 21 days   ALLERGIES:  is allergic to morphine and related and sulfa antibiotics.  MEDICATIONS: has a current medication list which includes the following prescription(s): alprazolam, bisacodyl, clopidogrel, cyclobenzaprine, dexlansoprazole, duloxetine, famotidine, furosemide, hydroxyzine, isosorbide mononitrate, lidocaine-prilocaine, lisinopril-hydrochlorothiazide, loratadine, nitroglycerin, ondansetron, oxycodone hcl, oxymetazoline, potassium chloride sa, potassium chloride sa, prednisone, sodium chloride, triamcinolone cream, and venlafaxine.  SURGICAL HISTORY:  Past Surgical History  Procedure Laterality Date  . Heart bypass      triple bypass with stents  . Coronary artery bypass graft      3 vessels  . Cardiac catheterization      stents x3  . Abdominal hysterectomy    . Portacath placement Right 12/06/2012    Procedure: INSERTION PORT-A-CATH;  Surgeon: Donato Heinz, MD;  Location: [facility];  Service: General;  Laterality: Right;    FAMILY HISTORY: family history includes Heart attack in her father and mother.  SOCIAL HISTORY:  reports that she quit smoking about 24 years ago. Her smoking use included Cigarettes. She has a 44 pack-year smoking history. She has never used smokeless tobacco. She reports that she does not drink alcohol or use illicit drugs.  REVIEW OF SYSTEMS:  Other than that discussed above is noncontributory.  PHYSICAL EXAMINATION: ECOG PERFORMANCE STATUS: 1 - Symptomatic but completely ambulatory  Blood pressure 129/67, pulse 75, temperature 98.4  F (36.9 C), temperature source Oral, resp. rate 18, weight 144 lb 1.6 oz (65.363 kg), SpO2 98.00%.  GENERAL:alert, no distress and comfortable SKIN:  skin color, texture, turgor are normal, no rashes or significant lesions EYES: PERLA; Conjunctiva are pink and non-injected, sclera clear SINUSES: No redness or tenderness over maxillary or ethmoid sinuses OROPHARYNX:no exudate, no erythema on lips, buccal mucosa, or tongue. NECK: supple, thyroid normal size, non-tender, without nodularity. No masses CHEST: Normal AP diameter with no breast masses. LifePort in place. LYMPH:  no palpable lymphadenopathy in the cervical, axillary or inguinal LUNGS: clear to auscultation and percussion with normal breathing effort HEART: regular rate & rhythm and no murmurs. ABDOMEN:abdomen soft, non-tender and normal bowel sounds. No distention. No free fluid wave or shifting dullness. No CVA tenderness. MUSCULOSKELETAL:no cyanosis of digits and no clubbing. Range of motion normal.  NEURO: alert & oriented x 3 with fluent speech, no focal motor/sensory deficits. No tremors noted at rest.   LABORATORY DATA: Infusion on 07/30/2013  Component Date Value Ref Range Status  . Specific Gravity, Urine 07/30/2013 1.015  1.005 - 1.030 Final  . pH 07/30/2013 6.0  5.0 - 8.0 Final  . Glucose, UA 07/30/2013 NEGATIVE  NEGATIVE mg/dL Final  . Hgb urine dipstick 07/30/2013 NEGATIVE  NEGATIVE Final  . Bilirubin Urine 07/30/2013 NEGATIVE  NEGATIVE Final  . Ketones, ur 07/30/2013 NEGATIVE  NEGATIVE mg/dL Final  . Protein, ur 07/30/2013 NEGATIVE  NEGATIVE mg/dL Final  . Urobilinogen, UA 07/30/2013 0.2  0.0 - 1.0 mg/dL Final  . Nitrite 07/30/2013 NEGATIVE  NEGATIVE Final  . Leukocytes, UA 07/30/2013 SMALL* NEGATIVE Final  Infusion on 07/09/2013  Component Date Value Ref Range Status  . Specific Gravity, Urine 07/09/2013 1.010  1.005 - 1.030 Final  . pH 07/09/2013 6.5  5.0 - 8.0 Final  . Glucose, UA 07/09/2013 NEGATIVE  NEGATIVE mg/dL Final  . Hgb urine dipstick 07/09/2013 NEGATIVE  NEGATIVE Final  . Bilirubin Urine 07/09/2013 NEGATIVE  NEGATIVE Final  . Ketones, ur  07/09/2013 NEGATIVE  NEGATIVE mg/dL Final  . Protein, ur 07/09/2013 NEGATIVE  NEGATIVE mg/dL Final  . Urobilinogen, UA 07/09/2013 0.2  0.0 - 1.0 mg/dL Final  . Nitrite 07/09/2013 NEGATIVE  NEGATIVE Final  . Leukocytes, UA 07/09/2013 SMALL* NEGATIVE Final  . WBC 07/09/2013 4.9  4.0 - 10.5 K/uL Final  . RBC 07/09/2013 3.56* 3.87 - 5.11 MIL/uL Final  . Hemoglobin 07/09/2013 11.4* 12.0 - 15.0 g/dL Final  . HCT 07/09/2013 33.5* 36.0 - 46.0 % Final  . MCV 07/09/2013 94.1  78.0 - 100.0 fL Final  . MCH 07/09/2013 32.0  26.0 - 34.0 pg Final  . MCHC 07/09/2013 34.0  30.0 - 36.0 g/dL Final  . RDW 07/09/2013 14.6  11.5 - 15.5 % Final  . Platelets 07/09/2013 147* 150 - 400 K/uL Final  . Neutrophils Relative % 07/09/2013 81* 43 - 77 % Final  . Neutro Abs 07/09/2013 4.0  1.7 - 7.7 K/uL Final  . Lymphocytes Relative 07/09/2013 14  12 - 46 % Final  . Lymphs Abs 07/09/2013 0.7  0.7 - 4.0 K/uL Final  . Monocytes Relative 07/09/2013 5  3 - 12 % Final  . Monocytes Absolute 07/09/2013 0.2  0.1 - 1.0 K/uL Final  . Eosinophils Relative 07/09/2013 0  0 - 5 % Final  . Eosinophils Absolute 07/09/2013 0.0  0.0 - 0.7 K/uL Final  . Basophils Relative 07/09/2013 0  0 - 1 % Final  . Basophils Absolute 07/09/2013 0.0  0.0 -  0.1 K/uL Final  . Sodium 07/09/2013 136* 137 - 147 mEq/L Final  . Potassium 07/09/2013 5.2  3.7 - 5.3 mEq/L Final  . Chloride 07/09/2013 101  96 - 112 mEq/L Final  . CO2 07/09/2013 25  19 - 32 mEq/L Final  . Glucose, Bld 07/09/2013 88  70 - 99 mg/dL Final  . BUN 60/47/9987 28* 6 - 23 mg/dL Final  . Creatinine, Ser 07/09/2013 0.74  0.50 - 1.10 mg/dL Final  . Calcium 21/58/7276 9.5  8.4 - 10.5 mg/dL Final  . Total Protein 07/09/2013 7.2  6.0 - 8.3 g/dL Final  . Albumin 18/48/5927 3.6  3.5 - 5.2 g/dL Final  . AST 63/94/3200 18  0 - 37 U/L Final  . ALT 07/09/2013 16  0 - 35 U/L Final  . Alkaline Phosphatase 07/09/2013 70  39 - 117 U/L Final  . Total Bilirubin 07/09/2013 0.2* 0.3 - 1.2 mg/dL Final   . GFR calc non Af Amer 07/09/2013 85* >90 mL/min Final  . GFR calc Af Amer 07/09/2013 >90  >90 mL/min Final   Comment: (NOTE)                          The eGFR has been calculated using the CKD EPI equation.                          This calculation has not been validated in all clinical situations.                          eGFR's persistently <90 mL/min signify possible Chronic Kidney                          Disease.    PATHOLOGY: Adenocarcinoma.  Urinalysis    Component Value Date/Time   COLORURINE YELLOW 02/10/2013 1650   APPEARANCEUR CLOUDY* 02/10/2013 1650   LABSPEC 1.015 07/30/2013 0900   PHURINE 6.0 07/30/2013 0900   GLUCOSEU NEGATIVE 07/30/2013 0900   HGBUR NEGATIVE 07/30/2013 0900   BILIRUBINUR NEGATIVE 07/30/2013 0900   KETONESUR NEGATIVE 07/30/2013 0900   PROTEINUR NEGATIVE 07/30/2013 0900   UROBILINOGEN 0.2 07/30/2013 0900   NITRITE NEGATIVE 07/30/2013 0900   LEUKOCYTESUR SMALL* 07/30/2013 0900    RADIOGRAPHIC STUDIES: Contrast Status: Final result         PACS Images    Show images for CT Abdomen Pelvis W Contrast         Study Result    CLINICAL DATA  Cervical cancer 1984, restaging for metastatic disease of unknown  primary, post chemotherapy, history coronary artery disease,  hypertension  EXAM  CT CHEST, ABDOMEN, AND PELVIS WITH CONTRAST  TECHNIQUE  Multidetector CT imaging of the chest, abdomen and pelvis was  performed following the standard protocol during bolus  administration of intravenous contrast. Sagittal and coronal MPR  images reconstructed from axial data set.  CONTRAST  OMNIPAQUE IOHEXOL 300 MG/ML SOLN. Dilute oral contrast.  COMPARISON  PET-CT 03/22/2013  FINDINGS  CT CHEST FINDINGS  Scattered atherosclerotic calcifications aorta and coronary  arteries.  Minimally enlarged precarinal lymph node 11 mm short axis image 21,  10 mm previously.  No additional thoracic adenopathy.  Lungs clear.  No infiltrate, pleural effusion,  pneumothorax or pulmonary  mass/nodule.  Perihilar ground-glass opacities seen on prior PET-CT resolved.  No acute osseous findings.  CT ABDOMEN AND PELVIS  FINDINGS  Liver, spleen, pancreas, kidneys, and adrenal glands normal  appearance.  Scattered atherosclerotic calcifications.  Uterus surgically absent.  Large cystic structure in left pelvis, 7.4 x 4.6 cm, previously 7.6  x 4.8 cm.  Right ovary and appendix not visualized.  Few uncomplicated descending colonic diverticula.  Stomach and bowel loops otherwise normal appearance.  Normal size retroperitoneal and mesenteric lymph nodes.  Unremarkable bladder in ureters.  No additional mass, adenopathy, free fluid or inflammatory process.  Bones demineralized with scattered degenerative disc and facet  disease changes of the lumbar spine.  IMPRESSION  Minimally enlarged precarinal lymph node 11 mm diameter, 10 mm on  previous PET-CT.  Little change in size of cystic left adnexal mass.  No new intra thoracic lower intra-abdominal abnormalities  identified.  SIGNATURE  Electronically Signed  By: Lavonia Dana M.D.  On: 06/12/2013 15     ASSESSMENT:  1. Peritoneal carcinomatosis,unknown primary, with excellent response to carboplatin and Taxol, but resulting in neuropathy, PET/CT scan demonstrates a complete remission on 03/22/2013. No definitive debulking surgery was performed. S/P 4 cycles of Carboplatin/Paclitaxel with an excellent response to therapy, but bone marrow toxicity requiring blood transfusions. Now on maintenance Avastin beginning on 04/16/2013, tolerating well except for pruritus which is now better controlled using low dose prednisone.  2. Lower extremity vasculitis with subcutaneous urticaria, improved on prednisone.  3. Peripheral neuropathy, better controlled on Cymbalta 30 mg at bedtime with lower analgesic requirement.  4. Anemia secondary to chemotherapy, improved from 9.3-11.1 after Aranesp was given on 04/16/2013.    5. Pruritus following treatment, it by Atarax, prednisone, and famotidine  6. Symptomatic allergic rhinitis with reactive left posterior cervical lymphadenopathy. 7. Shortness of breath on exertion probably due to anemia. 8. Tremors secondary to metoclopramide.      PLAN:  #1. Avastin intravenously today. Prednisone tomorrow. #2. Discontinue metoclopramide. #3. Aranesp if hemoglobin less than 10. #4. Followup in 3 weeks with a additional Avastin. Next CT scan to be done in June 2015.   All questions were answered. The patient knows to call the clinic with any problems, questions or concerns. We can certainly see the patient much sooner if necessary.   I spent 25 minutes counseling the patient face to face. The total time spent in the appointment was 30 minutes.    Farrel Gobble, MD 07/30/2013 9:30 AM  DISCLAIMER:  This note was dictated with voice recognition software.  Similar sounding words can inadvertently be transcribed inaccurately and may not be corrected upon review.

## 2013-07-30 NOTE — Patient Instructions (Signed)
[facility]  Discharge Instructions  RECOMMENDATIONS MADE BY THE CONSULTANT AND ANY TEST RESULTS WILL BE SENT TO YOUR REFERRING PHYSICIAN.  Stop taking the Reglan (Metaclopramide) it is causing the tremors in your hands.  You may take Alka-Seltzer for your heartburn.  You will see the doctor in 3 weeks and we will do the same labs at that time.  Thank you for choosing [facility] to provide your oncology and hematology care. To afford each patient quality time with our providers, please arrive at least 15 minutes before your scheduled appointment time. With your help, our goal is to use those 15 minutes to complete the necessary work-up to ensure our physicians have the information they need to help with your evaluation and healthcare recommendations.  Effective January 1st, 2014, we ask that you re-schedule your appointment with our physicians should you arrive 10 or more minutes late for your appointment. We strive to give you quality time with our providers, and arriving late affects you and other patients whose appointments are after yours.  Again, thank you for choosing [facility]. Our hope is that these requests will decrease the amount of time that you wait before being seen by our physicians.  _____________________________________________________________  Should you have questions after your visit to [facility], please contact our office at [phone] between the hours of 8:30 a.m. and 5:00 p.m. Voicemails left after 4:30 p.m. will not be returned until the following business day. For prescription refill requests, have your pharmacy contact our office with your prescription refill request.   

## 2013-08-15 ENCOUNTER — Telehealth (HOSPITAL_COMMUNITY): Payer: Self-pay

## 2013-08-15 ENCOUNTER — Other Ambulatory Visit (HOSPITAL_COMMUNITY): Payer: Self-pay | Admitting: Oncology

## 2013-08-15 ENCOUNTER — Telehealth (HOSPITAL_COMMUNITY): Payer: Self-pay | Admitting: Oncology

## 2013-08-15 DIAGNOSIS — C801 Malignant (primary) neoplasm, unspecified: Secondary | ICD-10-CM

## 2013-08-15 MED ORDER — OXYCODONE HCL 10 MG PO TABS: ORAL_TABLET | ORAL | Status: DC

## 2013-08-15 NOTE — Telephone Encounter (Signed)
Rx is ready

## 2013-08-20 ENCOUNTER — Encounter (HOSPITAL_COMMUNITY): Payer: Self-pay

## 2013-08-20 ENCOUNTER — Encounter (HOSPITAL_COMMUNITY): Payer: PRIVATE HEALTH INSURANCE | Attending: Internal Medicine

## 2013-08-20 ENCOUNTER — Encounter (HOSPITAL_BASED_OUTPATIENT_CLINIC_OR_DEPARTMENT_OTHER): Payer: PRIVATE HEALTH INSURANCE

## 2013-08-20 VITALS — BP 110/70 | HR 80 | Temp 97.0°F | Resp 18 | Wt 142.0 lb

## 2013-08-20 DIAGNOSIS — C786 Secondary malignant neoplasm of retroperitoneum and peritoneum: Secondary | ICD-10-CM | POA: Insufficient documentation

## 2013-08-20 DIAGNOSIS — D649 Anemia, unspecified: Secondary | ICD-10-CM

## 2013-08-20 DIAGNOSIS — I776 Arteritis, unspecified: Secondary | ICD-10-CM

## 2013-08-20 DIAGNOSIS — T451X5A Adverse effect of antineoplastic and immunosuppressive drugs, initial encounter: Secondary | ICD-10-CM | POA: Insufficient documentation

## 2013-08-20 DIAGNOSIS — R0989 Other specified symptoms and signs involving the circulatory and respiratory systems: Secondary | ICD-10-CM

## 2013-08-20 DIAGNOSIS — C801 Malignant (primary) neoplasm, unspecified: Secondary | ICD-10-CM

## 2013-08-20 DIAGNOSIS — R0609 Other forms of dyspnea: Secondary | ICD-10-CM

## 2013-08-20 DIAGNOSIS — R059 Cough, unspecified: Secondary | ICD-10-CM

## 2013-08-20 DIAGNOSIS — R05 Cough: Secondary | ICD-10-CM

## 2013-08-20 DIAGNOSIS — G609 Hereditary and idiopathic neuropathy, unspecified: Secondary | ICD-10-CM

## 2013-08-20 DIAGNOSIS — D6481 Anemia due to antineoplastic chemotherapy: Secondary | ICD-10-CM

## 2013-08-20 DIAGNOSIS — Z5112 Encounter for antineoplastic immunotherapy: Secondary | ICD-10-CM

## 2013-08-20 DIAGNOSIS — R259 Unspecified abnormal involuntary movements: Secondary | ICD-10-CM

## 2013-08-20 LAB — URINALYSIS, DIPSTICK ONLY
BILIRUBIN URINE: NEGATIVE
Glucose, UA: NEGATIVE mg/dL
Hgb urine dipstick: NEGATIVE
Ketones, ur: NEGATIVE mg/dL
Leukocytes, UA: NEGATIVE
NITRITE: NEGATIVE
Protein, ur: NEGATIVE mg/dL
UROBILINOGEN UA: 0.2 mg/dL (ref 0.0–1.0)
pH: 5.5 (ref 5.0–8.0)

## 2013-08-20 LAB — CBC WITH DIFFERENTIAL/PLATELET
BASOS ABS: 0 10*3/uL (ref 0.0–0.1)
Basophils Relative: 0 % (ref 0–1)
EOS ABS: 0.1 10*3/uL (ref 0.0–0.7)
Eosinophils Relative: 1 % (ref 0–5)
HCT: 29.1 % — ABNORMAL LOW (ref 36.0–46.0)
Hemoglobin: 9.6 g/dL — ABNORMAL LOW (ref 12.0–15.0)
LYMPHS ABS: 1.6 10*3/uL (ref 0.7–4.0)
Lymphocytes Relative: 24 % (ref 12–46)
MCH: 31.7 pg (ref 26.0–34.0)
MCHC: 33 g/dL (ref 30.0–36.0)
MCV: 96 fL (ref 78.0–100.0)
Monocytes Absolute: 0.4 10*3/uL (ref 0.1–1.0)
Monocytes Relative: 6 % (ref 3–12)
NEUTROS PCT: 69 % (ref 43–77)
Neutro Abs: 4.4 10*3/uL (ref 1.7–7.7)
PLATELETS: 232 10*3/uL (ref 150–400)
RBC: 3.03 MIL/uL — AB (ref 3.87–5.11)
RDW: 15.1 % (ref 11.5–15.5)
WBC: 6.5 10*3/uL (ref 4.0–10.5)

## 2013-08-20 LAB — COMPREHENSIVE METABOLIC PANEL
ALBUMIN: 3.3 g/dL — AB (ref 3.5–5.2)
ALK PHOS: 70 U/L (ref 39–117)
ALT: 10 U/L (ref 0–35)
AST: 15 U/L (ref 0–37)
BUN: 36 mg/dL — ABNORMAL HIGH (ref 6–23)
CO2: 24 mEq/L (ref 19–32)
Calcium: 9.9 mg/dL (ref 8.4–10.5)
Chloride: 100 mEq/L (ref 96–112)
Creatinine, Ser: 1.37 mg/dL — ABNORMAL HIGH (ref 0.50–1.10)
GFR calc Af Amer: 45 mL/min — ABNORMAL LOW (ref >90)
GFR calc non Af Amer: 39 mL/min — ABNORMAL LOW (ref >90)
Glucose, Bld: 116 mg/dL — ABNORMAL HIGH (ref 70–99)
POTASSIUM: 4.9 meq/L (ref 3.7–5.3)
SODIUM: 137 meq/L (ref 137–147)
TOTAL PROTEIN: 7.1 g/dL (ref 6.0–8.3)
Total Bilirubin: <0.2 mg/dL — ABNORMAL LOW (ref 0.3–1.2)

## 2013-08-20 MED ORDER — SODIUM CHLORIDE 0.9 % IJ SOLN
10.0000 mL | INTRAMUSCULAR | Status: DC | PRN
  Administered 2013-08-20: 10 mL

## 2013-08-20 MED ORDER — FUROSEMIDE 20 MG PO TABS: ORAL_TABLET | ORAL | Status: DC

## 2013-08-20 MED ORDER — SODIUM CHLORIDE 0.9 % IV SOLN
10.0000 mg | Freq: Once | INTRAVENOUS | Status: AC
  Administered 2013-08-20: 10 mg via INTRAVENOUS
  Filled 2013-08-20: qty 1

## 2013-08-20 MED ORDER — SODIUM CHLORIDE 0.9 % IV SOLN
15.0000 mg/kg | Freq: Once | INTRAVENOUS | Status: AC
  Administered 2013-08-20: 1050 mg via INTRAVENOUS
  Filled 2013-08-20: qty 32

## 2013-08-20 MED ORDER — SODIUM CHLORIDE 0.9 % IV SOLN
Freq: Once | INTRAVENOUS | Status: AC
  Administered 2013-08-20: 14:00:00 via INTRAVENOUS

## 2013-08-20 MED ORDER — HEPARIN SOD (PORK) LOCK FLUSH 100 UNIT/ML IV SOLN
500.0000 [IU] | Freq: Once | INTRAVENOUS | Status: AC | PRN
  Administered 2013-08-20: 500 [IU]

## 2013-08-20 MED ORDER — HEPARIN SOD (PORK) LOCK FLUSH 100 UNIT/ML IV SOLN
INTRAVENOUS | Status: AC
  Filled 2013-08-20: qty 5

## 2013-08-20 MED ORDER — DEXAMETHASONE SODIUM PHOSPHATE 10 MG/ML IJ SOLN: 10.0000 mg | Freq: Once | INTRAMUSCULAR | Status: DC

## 2013-08-20 NOTE — Progress Notes (Signed)
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 Peritoneal carcinomatosis    CURRENT THERAPY: Maintenance Avastin every 3 weeks begun on 04/16/2013  INTERVAL HISTORY: Erika Moreno 68 y.o. female returns for followup a receiving maintenance Avastin for peritoneal carcinomatosis status post carboplatin Taxol chemotherapy for 6 cycles ending on 11/15/2012 with a complete clinical response by PET scan documented on 03/22/2013. Avastin maintenance was started on 04/16/2013 an effort to prolong progression free survival. Last CA 125 determination on 05/28/2013 was 46.4. The patient's been more short of breath on mild exertion. She continues to have postnasal drip with occasional wheezing but no episodes of PND, orthopnea, or palpitations. She's had lower 70 swelling as well without headache. She does cough more than usual and denies any epistaxis, hemoptysis, hematuria, melena, or hematochezia. She also denies any vaginal bleeding. Appetite is good with no nausea, vomiting, diarrhea, or constipation. She denies a worsening joint pain, headache, or seizures.  MEDICAL HISTORY: Past Medical History  Diagnosis Date  . 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 Cervical cancer 1984  . Myocardial infarction 20 yrs ago  . Coronary artery disease   . Anxiety   . Chronic back pain   . Depression   . GERD (gastroesophageal reflux disease)   . Hypertension   . Hypothyroidism   . Anemia   . Vitamin B 12 deficiency   . Vitamin D deficiency disease     INTERIM HISTORY: has Cancer of unknown origin; Anemia due to chemotherapy; Normocytic anemia; Antineoplastic chemotherapy induced pancytopenia; Dyspnea on exertion; and Neuropathy due to chemotherapeutic drug on her problem  list.   Cancer of unknown origin (peritoneal carcinomatosis)  11/15/2012 - 02/21/2013( left cervical lymph node biopsy to make the diagnosis)  Chemotherapy  Carboplatin/Paclitaxel weekly x2 cycles followed by full dose for 4 cycles resulting in a complete response  11/15/2012  Treatment Plan Change  Transfer of care to [facility] from [facility]  03/22/2013 -Remission  PET scan- CR  04/16/2013 - Avastin maintenance every 21 days     ALLERGIES:  is allergic to morphine and related and sulfa antibiotics.  MEDICATIONS: has a current medication list which includes the following prescription(s): alprazolam, bisacodyl, clopidogrel, cyclobenzaprine, dexlansoprazole, duloxetine, famotidine, furosemide, hydroxyzine, isosorbide mononitrate, lidocaine-prilocaine, lisinopril-hydrochlorothiazide, loratadine, nitroglycerin, ondansetron, oxycodone hcl, oxymetazoline, potassium chloride sa, potassium chloride sa, prednisone, sodium chloride, triamcinolone cream, and venlafaxine, and the following Facility-Administered Medications: sodium chloride.  SURGICAL HISTORY:  Past Surgical History  Procedure Laterality Date  . Heart bypass      triple bypass with stents  . Coronary artery bypass graft      3 vessels  . Cardiac catheterization      stents x3  . Abdominal hysterectomy    . Portacath placement Right 12/06/2012    Procedure: INSERTION PORT-A-CATH;  Surgeon: Donato Heinz, MD;  Location: [facility];  Service: General;  Laterality: Right;    FAMILY HISTORY: family history includes Heart attack in her father and mother.  SOCIAL HISTORY:  reports that she quit smoking about 24 years ago. Her smoking use included Cigarettes. She has a 44 pack-year smoking history. She has never used smokeless tobacco. She reports that she does not drink alcohol or  use illicit drugs.  REVIEW OF SYSTEMS:  Other than that discussed above is noncontributory.  PHYSICAL EXAMINATION: ECOG  PERFORMANCE STATUS: 1 - Symptomatic but completely ambulatory  There were no vitals taken for this visit.  GENERAL:alert, no distress and comfortable SKIN: skin color, texture, turgor are normal, no rashes or significant lesions EYES: PERLA; Conjunctiva are pink and non-injected, sclera clear SINUSES: No redness or tenderness over maxillary or ethmoid sinuses OROPHARYNX:no exudate, no erythema on lips, buccal mucosa, or tongue. NECK: supple, thyroid normal size, non-tender, without nodularity. No masses CHEST: LifePort in place. No breast masses. Midline scar well healed. LYMPH:  no palpable lymphadenopathy in the cervical, axillary or inguinal LUNGS: clear to auscultation and percussion with normal breathing effort HEART: regular rate & rhythm and no murmurs. No S3. ABDOMEN:abdomen soft, non-tender and normal bowel sounds MUSCULOSKELETAL:no cyanosis of digits and no clubbing. Range of motion normal. Pulse 1 bilateral lower extremity edema. NEURO: alert & oriented x 3 with fluent speech, no focal motor/sensory deficits   LABORATORY DATA: Infusion on 08/20/2013  Component Date Value Ref Range Status  . Specific Gravity, Urine 08/20/2013 >1.030* 1.005 - 1.030 Final  . pH 08/20/2013 5.5  5.0 - 8.0 Final  . Glucose, UA 08/20/2013 NEGATIVE  NEGATIVE mg/dL Final  . Hgb urine dipstick 08/20/2013 NEGATIVE  NEGATIVE Final  . Bilirubin Urine 08/20/2013 NEGATIVE  NEGATIVE Final  . Ketones, ur 08/20/2013 NEGATIVE  NEGATIVE mg/dL Final  . Protein, ur 29/93/7169 NEGATIVE  NEGATIVE mg/dL Final  . Urobilinogen, UA 08/20/2013 0.2  0.0 - 1.0 mg/dL Final  . Nitrite 67/89/3810 NEGATIVE  NEGATIVE Final  . Leukocytes, UA 08/20/2013 NEGATIVE  NEGATIVE Final  . WBC 08/20/2013 6.5  4.0 - 10.5 K/uL Final  . RBC 08/20/2013 3.03* 3.87 - 5.11 MIL/uL Final  . Hemoglobin 08/20/2013 9.6* 12.0 - 15.0 g/dL Final  . HCT 17/51/0258 29.1* 36.0 - 46.0 % Final  . MCV 08/20/2013 96.0  78.0 - 100.0 fL Final  . MCH  08/20/2013 31.7  26.0 - 34.0 pg Final  . MCHC 08/20/2013 33.0  30.0 - 36.0 g/dL Final  . RDW 52/77/8242 15.1  11.5 - 15.5 % Final  . Platelets 08/20/2013 232  150 - 400 K/uL Final  . Neutrophils Relative % 08/20/2013 69  43 - 77 % Final  . Neutro Abs 08/20/2013 4.4  1.7 - 7.7 K/uL Final  . Lymphocytes Relative 08/20/2013 24  12 - 46 % Final  . Lymphs Abs 08/20/2013 1.6  0.7 - 4.0 K/uL Final  . Monocytes Relative 08/20/2013 6  3 - 12 % Final  . Monocytes Absolute 08/20/2013 0.4  0.1 - 1.0 K/uL Final  . Eosinophils Relative 08/20/2013 1  0 - 5 % Final  . Eosinophils Absolute 08/20/2013 0.1  0.0 - 0.7 K/uL Final  . Basophils Relative 08/20/2013 0  0 - 1 % Final  . Basophils Absolute 08/20/2013 0.0  0.0 - 0.1 K/uL Final  . Sodium 08/20/2013 137  137 - 147 mEq/L Final  . Potassium 08/20/2013 4.9  3.7 - 5.3 mEq/L Final  . Chloride 08/20/2013 100  96 - 112 mEq/L Final  . CO2 08/20/2013 24  19 - 32 mEq/L Final  . Glucose, Bld 08/20/2013 116* 70 - 99 mg/dL Final  . BUN 35/36/1443 36* 6 - 23 mg/dL Final  . Creatinine, Ser 08/20/2013 1.37* 0.50 - 1.10 mg/dL Final  . Calcium 15/40/0867 9.9  8.4 - 10.5 mg/dL Final  . Total Protein 08/20/2013 7.1  6.0 -  8.3 g/dL Final  . Albumin 08/20/2013 3.3* 3.5 - 5.2 g/dL Final  . AST 08/20/2013 15  0 - 37 U/L Final  . ALT 08/20/2013 10  0 - 35 U/L Final  . Alkaline Phosphatase 08/20/2013 70  39 - 117 U/L Final  . Total Bilirubin 08/20/2013 <0.2* 0.3 - 1.2 mg/dL Final  . GFR calc non Af Amer 08/20/2013 39* >90 mL/min Final  . GFR calc Af Amer 08/20/2013 45* >90 mL/min Final   Comment: (NOTE)                          The eGFR has been calculated using the CKD EPI equation.                          This calculation has not been validated in all clinical situations.                          eGFR's persistently <90 mL/min signify possible Chronic Kidney                          Disease.  Infusion on 07/30/2013  Component Date Value Ref Range Status  . Specific  Gravity, Urine 07/30/2013 1.015  1.005 - 1.030 Final  . pH 07/30/2013 6.0  5.0 - 8.0 Final  . Glucose, UA 07/30/2013 NEGATIVE  NEGATIVE mg/dL Final  . Hgb urine dipstick 07/30/2013 NEGATIVE  NEGATIVE Final  . Bilirubin Urine 07/30/2013 NEGATIVE  NEGATIVE Final  . Ketones, ur 07/30/2013 NEGATIVE  NEGATIVE mg/dL Final  . Protein, ur 07/30/2013 NEGATIVE  NEGATIVE mg/dL Final  . Urobilinogen, UA 07/30/2013 0.2  0.0 - 1.0 mg/dL Final  . Nitrite 07/30/2013 NEGATIVE  NEGATIVE Final  . Leukocytes, UA 07/30/2013 SMALL* NEGATIVE Final  . WBC 07/30/2013 4.6  4.0 - 10.5 K/uL Final  . RBC 07/30/2013 3.35* 3.87 - 5.11 MIL/uL Final  . Hemoglobin 07/30/2013 10.9* 12.0 - 15.0 g/dL Final  . HCT 07/30/2013 31.7* 36.0 - 46.0 % Final  . MCV 07/30/2013 94.6  78.0 - 100.0 fL Final  . MCH 07/30/2013 32.5  26.0 - 34.0 pg Final  . MCHC 07/30/2013 34.4  30.0 - 36.0 g/dL Final  . RDW 07/30/2013 14.9  11.5 - 15.5 % Final  . Platelets 07/30/2013 176  150 - 400 K/uL Final  . Neutrophils Relative % 07/30/2013 67  43 - 77 % Final  . Neutro Abs 07/30/2013 3.1  1.7 - 7.7 K/uL Final  . Lymphocytes Relative 07/30/2013 25  12 - 46 % Final  . Lymphs Abs 07/30/2013 1.1  0.7 - 4.0 K/uL Final  . Monocytes Relative 07/30/2013 7  3 - 12 % Final  . Monocytes Absolute 07/30/2013 0.3  0.1 - 1.0 K/uL Final  . Eosinophils Relative 07/30/2013 1  0 - 5 % Final  . Eosinophils Absolute 07/30/2013 0.0  0.0 - 0.7 K/uL Final  . Basophils Relative 07/30/2013 0  0 - 1 % Final  . Basophils Absolute 07/30/2013 0.0  0.0 - 0.1 K/uL Final  . Sodium 07/30/2013 137  137 - 147 mEq/L Final  . Potassium 07/30/2013 5.2  3.7 - 5.3 mEq/L Final  . Chloride 07/30/2013 99  96 - 112 mEq/L Final  . CO2 07/30/2013 27  19 - 32 mEq/L Final  . Glucose, Bld 07/30/2013 90  70 - 99 mg/dL Final  .  BUN 07/30/2013 24* 6 - 23 mg/dL Final  . Creatinine, Ser 07/30/2013 0.98  0.50 - 1.10 mg/dL Final  . Calcium 07/30/2013 9.4  8.4 - 10.5 mg/dL Final  . Total Protein  07/30/2013 7.0  6.0 - 8.3 g/dL Final  . Albumin 07/30/2013 3.7  3.5 - 5.2 g/dL Final  . AST 07/30/2013 18  0 - 37 U/L Final  . ALT 07/30/2013 13  0 - 35 U/L Final  . Alkaline Phosphatase 07/30/2013 65  39 - 117 U/L Final  . Total Bilirubin 07/30/2013 0.2* 0.3 - 1.2 mg/dL Final  . GFR calc non Af Amer 07/30/2013 58* >90 mL/min Final  . GFR calc Af Amer 07/30/2013 67* >90 mL/min Final   Comment: (NOTE)                          The eGFR has been calculated using the CKD EPI equation.                          This calculation has not been validated in all clinical situations.                          eGFR's persistently <90 mL/min signify possible Chronic Kidney                          Disease.    PATHOLOGY: No new pathology.  Urinalysis    Component Value Date/Time   COLORURINE YELLOW 02/10/2013 1650   APPEARANCEUR CLOUDY* 02/10/2013 1650   LABSPEC >1.030* 08/20/2013 1339   PHURINE 5.5 08/20/2013 1339   GLUCOSEU NEGATIVE 08/20/2013 1339   HGBUR NEGATIVE 08/20/2013 1339   BILIRUBINUR NEGATIVE 08/20/2013 1339   KETONESUR NEGATIVE 08/20/2013 1339   PROTEINUR NEGATIVE 08/20/2013 1339   UROBILINOGEN 0.2 08/20/2013 1339   NITRITE NEGATIVE 08/20/2013 1339   LEUKOCYTESUR NEGATIVE 08/20/2013 1339    RADIOGRAPHIC STUDIES: No results found.  ASSESSMENT:  1. Peritoneal carcinomatosis,unknown primary, with excellent response to carboplatin and Taxol, but resulting in neuropathy, PET/CT scan demonstrates a complete remission on 03/22/2013. No definitive debulking surgery was performed. S/P 4 cycles of Carboplatin/Paclitaxel with an excellent response to therapy, but bone marrow toxicity requiring blood transfusions. Now on maintenance Avastin beginning on 04/16/2013, tolerating well except for pruritus which is now better controlled using low dose prednisone.  2. Lower extremity vasculitis with subcutaneous urticaria, improved on prednisone.  3. Peripheral neuropathy, better controlled on Cymbalta 30 mg  at bedtime with lower analgesic requirement.  4. Anemia secondary to chemotherapy, improved from 9.3-11.1 after Aranesp was given on 04/16/2013.  5. Pruritus following treatment, it by Atarax, prednisone, and famotidine  6. Symptomatic allergic rhinitis with reactive left posterior cervical lymphadenopathy.  7. Shortness of breath on exertion probably due to anemia and possible fluid overload. 8. Tremors secondary to metoclopramide.    [name]   PLAN:  #1. Increase furosemide 40 mg daily for about 4 days monitoring body weight and discontinuing after loss of 3-4 pounds. She should then resume 20 mg daily. #2. Continue prednisone around the time of Avastin administration to lessen pruritus. #3. IV Avastin today along with Aranesp 500 mcg. #4. Followup in 3 weeks with CBC, chem profile, and CA 125.   All questions were answered. The patient knows to call the clinic with any problems, questions or concerns. We can certainly see the patient  much sooner if necessary.   I spent 25 minutes counseling the patient face to face. The total time spent in the appointment was 30 minutes.    Farrel Gobble, MD 08/20/2013 3:51 PM  DISCLAIMER:  This note was dictated with voice recognition software.  Similar sounding words can inadvertently be transcribed inaccurately and may not be corrected upon review.

## 2013-08-20 NOTE — Patient Instructions (Signed)
[place] Discharge Instructions  RECOMMENDATIONS MADE BY THE CONSULTANT AND ANY TEST RESULTS WILL BE SENT TO YOUR REFERRING PHYSICIAN.  PLAN:  Increase furosemide 40 mg daily for about 4 days monitoring body weight and discontinuing after loss of 3-4 pounds. You should then resume 20 mg daily.  Continue prednisone around the time of Avastin administration to lessen pruritus.  Followup in 3 weeks with CBC, chem profile, and CA 125.     Thank you for choosing [facility] to provide your oncology and hematology care.  To afford each patient quality time with our providers, please arrive at least 15 minutes before your scheduled appointment time.  With your help, our goal is to use those 15 minutes to complete the necessary work-up to ensure our physicians have the information they need to help with your evaluation and healthcare recommendations.    Effective January 1st, 2014, we ask that you re-schedule your appointment with our physicians should you arrive 10 or more minutes late for your appointment.  We strive to give you quality time with our providers, and arriving late affects you and other patients whose appointments are after yours.    Again, thank you for choosing [facility].  Our hope is that these requests will decrease the amount of time that you wait before being seen by our physicians.       _____________________________________________________________  Should you have questions after your visit to [facility], please contact our office at [phone] between the hours of 8:30 a.m. and 5:00 p.m.  Voicemails left after 4:30 p.m. will not be returned until the following business day.  For prescription refill requests, have your pharmacy contact our office with your prescription refill request.

## 2013-08-21 ENCOUNTER — Other Ambulatory Visit (HOSPITAL_COMMUNITY): Payer: Self-pay | Admitting: Hematology and Oncology

## 2013-08-21 ENCOUNTER — Telehealth (HOSPITAL_COMMUNITY): Payer: Self-pay

## 2013-08-21 DIAGNOSIS — C801 Malignant (primary) neoplasm, unspecified: Principal | ICD-10-CM

## 2013-08-21 DIAGNOSIS — C786 Secondary malignant neoplasm of retroperitoneum and peritoneum: Secondary | ICD-10-CM

## 2013-08-21 LAB — CA 125: CA 125: 100.4 U/mL — ABNORMAL HIGH (ref 0.0–30.2)

## 2013-08-21 NOTE — Telephone Encounter (Signed)
Message copied by Mellissa Kohut on Tue Aug 21, 2013  9:24 AM ------      Message from: Farrel Gobble A      Created: Tue Aug 21, 2013  8:06 AM       Have ordered PET for 08/30/2013 due to increased CA125.  Suspect operable disease in pelvis.  Patient has never had definitive abdominal surgery (Bx was supraclavicular node).  Left patient a VM this AM because no one answered the phone. Please schedule her for a followup appointment soon after PET finally scheduled.  Thanks.  ------

## 2013-08-21 NOTE — Telephone Encounter (Signed)
Message left for patient to call office to discuss lab results and PET scan.

## 2013-08-22 ENCOUNTER — Telehealth (HOSPITAL_COMMUNITY): Payer: Self-pay

## 2013-08-22 ENCOUNTER — Other Ambulatory Visit (HOSPITAL_COMMUNITY): Payer: Self-pay | Admitting: Hematology and Oncology

## 2013-08-22 NOTE — Telephone Encounter (Signed)
Call back from Ms. Kipp.  Discussed lab results and reason for PET scan.  Verbalized understanding of instructions.

## 2013-08-22 NOTE — Telephone Encounter (Signed)
Unable to reach Erika Moreno by phone and spoke with daughter Erika Moreno and she will let Erika Moreno know that a PET scan has been ordered for 5/27 and office visit on 5/29.  Erika Moreno to call back with any questions.

## 2013-08-29 ENCOUNTER — Ambulatory Visit (HOSPITAL_COMMUNITY)
Admission: RE | Admit: 2013-08-29 | Discharge: 2013-08-29 | Disposition: A | Discharge status: Home / Self Care | Payer: PRIVATE HEALTH INSURANCE | Source: Ambulatory Visit | Attending: Hematology and Oncology | Admitting: Hematology and Oncology

## 2013-08-29 DIAGNOSIS — J3489 Other specified disorders of nose and nasal sinuses: Secondary | ICD-10-CM | POA: Insufficient documentation

## 2013-08-29 DIAGNOSIS — C786 Secondary malignant neoplasm of retroperitoneum and peritoneum: Secondary | ICD-10-CM | POA: Insufficient documentation

## 2013-08-29 DIAGNOSIS — J320 Chronic maxillary sinusitis: Secondary | ICD-10-CM | POA: Insufficient documentation

## 2013-08-29 DIAGNOSIS — N200 Calculus of kidney: Secondary | ICD-10-CM | POA: Insufficient documentation

## 2013-08-29 DIAGNOSIS — C569 Malignant neoplasm of unspecified ovary: Secondary | ICD-10-CM | POA: Insufficient documentation

## 2013-08-29 DIAGNOSIS — R599 Enlarged lymph nodes, unspecified: Secondary | ICD-10-CM | POA: Insufficient documentation

## 2013-08-29 DIAGNOSIS — I517 Cardiomegaly: Secondary | ICD-10-CM | POA: Insufficient documentation

## 2013-08-29 DIAGNOSIS — C801 Malignant (primary) neoplasm, unspecified: Secondary | ICD-10-CM

## 2013-08-29 DIAGNOSIS — C50919 Malignant neoplasm of unspecified site of unspecified female breast: Secondary | ICD-10-CM | POA: Insufficient documentation

## 2013-08-29 LAB — GLUCOSE, CAPILLARY: GLUCOSE-CAPILLARY: 89 mg/dL (ref 70–99)

## 2013-08-29 MED ORDER — FLUDEOXYGLUCOSE F - 18 (FDG) INJECTION
7.3000 | Freq: Once | INTRAVENOUS | Status: AC | PRN
  Administered 2013-08-29: 7.3 via INTRAVENOUS

## 2013-08-31 ENCOUNTER — Encounter (HOSPITAL_BASED_OUTPATIENT_CLINIC_OR_DEPARTMENT_OTHER): Payer: PRIVATE HEALTH INSURANCE

## 2013-08-31 ENCOUNTER — Encounter (HOSPITAL_COMMUNITY): Payer: Self-pay

## 2013-08-31 VITALS — BP 106/60 | HR 60 | Temp 98.2°F | Resp 18 | Wt 143.1 lb

## 2013-08-31 DIAGNOSIS — C772 Secondary and unspecified malignant neoplasm of intra-abdominal lymph nodes: Secondary | ICD-10-CM

## 2013-08-31 DIAGNOSIS — C50919 Malignant neoplasm of unspecified site of unspecified female breast: Secondary | ICD-10-CM

## 2013-08-31 DIAGNOSIS — C801 Malignant (primary) neoplasm, unspecified: Secondary | ICD-10-CM

## 2013-08-31 DIAGNOSIS — C779 Secondary and unspecified malignant neoplasm of lymph node, unspecified: Secondary | ICD-10-CM

## 2013-08-31 DIAGNOSIS — C786 Secondary malignant neoplasm of retroperitoneum and peritoneum: Secondary | ICD-10-CM

## 2013-08-31 DIAGNOSIS — C482 Malignant neoplasm of peritoneum, unspecified: Secondary | ICD-10-CM

## 2013-08-31 MED ORDER — OXYCODONE HCL 10 MG PO TABS: ORAL_TABLET | ORAL | Status: DC

## 2013-08-31 NOTE — Patient Instructions (Addendum)
[facility] Discharge Instructions  RECOMMENDATIONS MADE BY THE CONSULTANT AND ANY TEST RESULTS WILL BE SENT TO YOUR REFERRING PHYSICIAN.  Discontinue Avastin.   Continue prednisone twice a day.   Baseline MUGA scan. June 3rd at [facility] salvage chemotherapy with Doxil 40 mg per meter squared monthly on 09/10/2013 treating platinum resistant recurrent disease. Hildred Alamin will call you for an appointment time for education on the new medication.  Doxil which will start June 8th.   Thank you for choosing [facility] to provide your oncology and hematology care.  To afford each patient quality time with our providers, please arrive at least 15 minutes before your scheduled appointment time.  With your help, our goal is to use those 15 minutes to complete the necessary work-up to ensure our physicians have the information they need to help with your evaluation and healthcare recommendations.    Effective January 1st, 2014, we ask that you re-schedule your appointment with our physicians should you arrive 10 or more minutes late for your appointment.  We strive to give you quality time with our providers, and arriving late affects you and other patients whose appointments are after yours.    Again, thank you for choosing [facility].  Our hope is that these requests will decrease the amount of time that you wait before being seen by our physicians.       _____________________________________________________________  Should you have questions after your visit to [facility], please contact our office at [phone] between the hours of 8:30 a.m. and 5:00 p.m.  Voicemails left after 4:30 p.m. will not be returned until the following business day.  For prescription refill requests, have your pharmacy contact our office with your prescription refill request.

## 2013-08-31 NOTE — Progress Notes (Signed)
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 Followup after PET scan    CURRENT THERAPY: Maintenance Avastin since January of 2015  INTERVAL HISTORY: Erika Moreno 68 y.o. female returns for followup after PET scan was performed because of rising CA 125.  She had a prescription filled for 120 oxycodone tablets 10 mg on 08/15/2013 and has run out of them. She is not sure whether or not the precise number was dispensed. She did call pharmacy and a shorter that the number was correct. We'll call this morning as well to verify that fact and the pharmacist who fill the prescription was layer and definitely said that 120 tablets were dispensed. The patient continues to have some achiness of lower 70s with rash that has improved on prednisone now taking it twice a day. She denies abdominal pain, nausea, vomiting, diarrhea, constipation, melena, hematochezia, hematuria, or vaginal bleeding. She also denies a dysphagia but has had nasal drip.  MEDICAL HISTORY: Past Medical History  Diagnosis Date  . 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 Cervical cancer 1984  . Myocardial infarction 20 yrs ago  . Coronary artery disease   . Anxiety   . Chronic back pain   . Depression   . GERD (gastroesophageal reflux disease)   . Hypertension   . Hypothyroidism   . Anemia   . Vitamin B 12 deficiency   . Vitamin D deficiency disease     INTERIM HISTORY: has Cancer of unknown origin; Anemia due to chemotherapy; Normocytic anemia; Antineoplastic chemotherapy induced pancytopenia; Dyspnea on exertion; and Neuropathy due to chemotherapeutic drug on her problem list.   Cancer of unknown origin  (peritoneal carcinomatosis)  11/15/2012 - 02/21/2013( left cervical lymph node biopsy to make the diagnosis)  Chemotherapy  Carboplatin/Paclitaxel weekly x2 cycles followed by full dose for 4 cycles resulting in a complete response  11/15/2012  Treatment Plan Change  Transfer of care to [facility]  03/22/2013 -Remission  PET scan- CR  04/16/2013 - Avastin maintenance every 21 days  08/29/2013--PET scan shows progression in lymph nodes in the neck, chest, abdomen, and pelvis without peritoneal spread.  ALLERGIES:  is allergic to morphine and related and sulfa antibiotics.  MEDICATIONS: has a current medication list which includes the following prescription(s): alprazolam, bisacodyl, clopidogrel, cyclobenzaprine, dexlansoprazole, duloxetine, famotidine, furosemide, hydroxyzine, isosorbide mononitrate, lidocaine-prilocaine, lisinopril-hydrochlorothiazide, loratadine, nitroglycerin, ondansetron, oxymetazoline, potassium chloride sa, potassium chloride sa, prednisone, sodium chloride, triamcinolone cream, venlafaxine, and oxycodone hcl.  SURGICAL HISTORY:  Past Surgical History  Procedure Laterality Date  . Heart bypass      triple bypass with stents  . Coronary artery bypass graft      3 vessels  . Cardiac catheterization      stents x3  . Abdominal hysterectomy    . Portacath placement Right 12/06/2012    Procedure: INSERTION PORT-A-CATH;  Surgeon: Donato Heinz, MD;  Location: [facility];  Service: General;  Laterality: Right;    FAMILY HISTORY: family history includes Heart attack in her father and mother.  SOCIAL HISTORY:  reports that she quit smoking about 24 years ago.  Her smoking use included Cigarettes. She has a 44 pack-year smoking history. She has never used smokeless tobacco. She reports that she does not drink alcohol or use illicit drugs.  REVIEW OF SYSTEMS:  Other than that discussed above is noncontributory.  PHYSICAL  EXAMINATION: ECOG PERFORMANCE STATUS: 1 - Symptomatic but completely ambulatory  Blood pressure 106/60, pulse 60, temperature 98.2 F (36.8 C), temperature source Oral, resp. rate 18, weight 143 lb 1.6 oz (64.91 kg).  GENERAL:alert, no distress and comfortable SKIN: skin color, texture, turgor are normal, no rashes or significant lesions EYES: PERLA; Conjunctiva are pink and non-injected, sclera clear SINUSES: No redness or tenderness over maxillary or ethmoid sinuses OROPHARYNX:no exudate, no erythema on lips, buccal mucosa, or tongue. NECK: supple, thyroid normal size, non-tender, without nodularity. No masses CHEST:  normal AP diameter with no breast masses. LifePort in place. LYMPH:  no palpable lymphadenopathy in the cervical, axillary or inguinal LUNGS: clear to auscultation and percussion with normal breathing effort HEART: regular rate & rhythm and no murmurs. ABDOMEN:abdomen soft, non-tender and normal bowel sounds. No masses. No free fluid wave or shifting dullness.  MUSCULOSKELETAL:no cyanosis of digits and no clubbing. Range of motion normal. +1 edema both lower extremities.   NEURO: alert & oriented x 3 with fluent speech, no focal motor/sensory deficits   LABORATORY DATA: Hospital Outpatient Visit on 08/29/2013  Component Date Value Ref Range Status  . Glucose-Capillary 08/29/2013 89  70 - 99 mg/dL Final  Infusion on 08/20/2013  Component Date Value Ref Range Status  . Specific Gravity, Urine 08/20/2013 >1.030* 1.005 - 1.030 Final  . pH 08/20/2013 5.5  5.0 - 8.0 Final  . Glucose, UA 08/20/2013 NEGATIVE  NEGATIVE mg/dL Final  . Hgb urine dipstick 08/20/2013 NEGATIVE  NEGATIVE Final  . Bilirubin Urine 08/20/2013 NEGATIVE  NEGATIVE Final  . Ketones, ur 08/20/2013 NEGATIVE  NEGATIVE mg/dL Final  . Protein, ur 08/20/2013 NEGATIVE  NEGATIVE mg/dL Final  . Urobilinogen, UA 08/20/2013 0.2  0.0 - 1.0 mg/dL Final  . Nitrite 08/20/2013 NEGATIVE  NEGATIVE Final  . Leukocytes, UA  08/20/2013 NEGATIVE  NEGATIVE Final  . WBC 08/20/2013 6.5  4.0 - 10.5 K/uL Final  . RBC 08/20/2013 3.03* 3.87 - 5.11 MIL/uL Final  . Hemoglobin 08/20/2013 9.6* 12.0 - 15.0 g/dL Final  . HCT 08/20/2013 29.1* 36.0 - 46.0 % Final  . MCV 08/20/2013 96.0  78.0 - 100.0 fL Final  . MCH 08/20/2013 31.7  26.0 - 34.0 pg Final  . MCHC 08/20/2013 33.0  30.0 - 36.0 g/dL Final  . RDW 08/20/2013 15.1  11.5 - 15.5 % Final  . Platelets 08/20/2013 232  150 - 400 K/uL Final  . Neutrophils Relative % 08/20/2013 69  43 - 77 % Final  . Neutro Abs 08/20/2013 4.4  1.7 - 7.7 K/uL Final  . Lymphocytes Relative 08/20/2013 24  12 - 46 % Final  . Lymphs Abs 08/20/2013 1.6  0.7 - 4.0 K/uL Final  . Monocytes Relative 08/20/2013 6  3 - 12 % Final  . Monocytes Absolute 08/20/2013 0.4  0.1 - 1.0 K/uL Final  . Eosinophils Relative 08/20/2013 1  0 - 5 % Final  . Eosinophils Absolute 08/20/2013 0.1  0.0 - 0.7 K/uL Final  . Basophils Relative 08/20/2013 0  0 - 1 % Final  . Basophils Absolute 08/20/2013 0.0  0.0 - 0.1 K/uL Final  . Sodium 08/20/2013 137  137 - 147 mEq/L Final  . Potassium 08/20/2013 4.9  3.7 - 5.3  mEq/L Final  . Chloride 08/20/2013 100  96 - 112 mEq/L Final  . CO2 08/20/2013 24  19 - 32 mEq/L Final  . Glucose, Bld 08/20/2013 116* 70 - 99 mg/dL Final  . BUN 08/20/2013 36* 6 - 23 mg/dL Final  . Creatinine, Ser 08/20/2013 1.37* 0.50 - 1.10 mg/dL Final  . Calcium 08/20/2013 9.9  8.4 - 10.5 mg/dL Final  . Total Protein 08/20/2013 7.1  6.0 - 8.3 g/dL Final  . Albumin 08/20/2013 3.3* 3.5 - 5.2 g/dL Final  . AST 08/20/2013 15  0 - 37 U/L Final  . ALT 08/20/2013 10  0 - 35 U/L Final  . Alkaline Phosphatase 08/20/2013 70  39 - 117 U/L Final  . Total Bilirubin 08/20/2013 <0.2* 0.3 - 1.2 mg/dL Final  . GFR calc non Af Amer 08/20/2013 39* >90 mL/min Final  . GFR calc Af Amer 08/20/2013 45* >90 mL/min Final   Comment: (NOTE)                          The eGFR has been calculated using the CKD EPI equation.                           This calculation has not been validated in all clinical situations.                          eGFR's persistently <90 mL/min signify possible Chronic Kidney                          Disease.  . CA 125 08/20/2013 100.4* 0.0 - 30.2 U/mL Final   Performed at [facility]: adenocarcinoma.   Urinalysis    Component Value Date/Time   COLORURINE YELLOW 02/10/2013 1650   APPEARANCEUR CLOUDY* 02/10/2013 1650   LABSPEC >1.030* 08/20/2013 1339   PHURINE 5.5 08/20/2013 1339   GLUCOSEU NEGATIVE 08/20/2013 1339   HGBUR NEGATIVE 08/20/2013 1339   BILIRUBINUR NEGATIVE 08/20/2013 1339   KETONESUR NEGATIVE 08/20/2013 1339   PROTEINUR NEGATIVE 08/20/2013 1339   UROBILINOGEN 0.2 08/20/2013 1339   NITRITE NEGATIVE 08/20/2013 1339   LEUKOCYTESUR NEGATIVE 08/20/2013 1339    RADIOGRAPHIC STUDIES: Nm Pet Image Restag (ps) Skull Base To Thigh  08/29/2013   CLINICAL DATA:  Subsequent treatment strategy for ovarian cancer. Breast cancer. Peritoneal carcinomatosis.  EXAM: NUCLEAR MEDICINE PET SKULL BASE TO THIGH  TECHNIQUE: 7.3 mCi F-18 FDG was injected intravenously. Full-ring PET imaging was performed from the skull base to thigh after the radiotracer. CT data was obtained and used for attenuation correction and anatomic localization.  FASTING BLOOD GLUCOSE:  Value: 89 mg/dl  COMPARISON:  Multiple exams, including 06/12/2013 and 03/22/2013  FINDINGS: NECK  Right station 3 lymph node, 0.7 cm in short axis, maximum standard uptake value 10.1. Focally high activity corresponding to a small lymph node in the left supraclavicular adipose tissue, 0.6 cm in short axis, maximum standard uptake value 6.6.  Incidental chronic left maxillary sinusitis with a large mucous retention cyst.  CHEST  Scattered axillary adenopathy with a lateral left axillary node, 0.8 cm in short axis, maximum standard uptake value 10.2. Hypermetabolic right subpectoral node along the subclavian vessels, 0.5 cm in short  axis, maximum standard uptake value 4.3.  Hypermetabolic precarinal, subcarinal, and right infrahilar adenopathy. Precarinal lymph node maximum standard uptake value 13.9,  short axis diameter 1.3 cm.  Faint hypermetabolism in a bandlike density in the lingula, maximum standard uptake value 2.9.  Cardiomegaly noted.  ABDOMEN/PELVIS  The gastrohepatic ligament lymph node maximum standard uptake value 4.6, short axis diameter 0.8 cm. Left lower periaortic adenopathy, short axis diameter 0.8 cm, maximum standard uptake value 11.6. Small left common iliac lymph nodes, maximum standard uptake value 6.6, short axis diameter 0.7 cm. Hypermetabolic focus above the cystic left adnexal lesion, just along its superior margin, maximum standard uptake value 8.5. The cystic lesion itself measures 7.3 x 4.0 cm, with higher density posterior component.  Scattered colonic and small bowel activity, likely physiologic.  Small foci of hypermetabolic activity in the transverse colon are somewhat focal and may represent hypermetabolic polyps, but are poorly correlated on the CT data.  Nonobstructive right nephrolithiasis. New mesenteric stranding and scattered small lymph nodes observed in the central mesentery, but without central mesenteric hypermetabolic activity.  SKELETON  No focal hypermetabolic activity to suggest skeletal metastasis.  IMPRESSION: 1. Scattered increasingly hypermetabolic lymph nodes in the neck, chest, abdomen, and pelvis indicative of nodal spread of cancer. No compelling pattern of peritoneal spread of the cancer. 2. New central mesenteric stranding with scattered mesenteric lymph nodes but without abnormal high activity within these lymph nodes. The cause of the mesenteric stranding is uncertain; pancreatitis is not excluded. 3. Chronic left maxillary sinusitis.  Cardiomegaly. 4. Scattered foci of increased activity in the transverse colon could represent polyps, although were not hypermetabolic previously and  accordingly are more likely physiologic. 5. Nonobstructive right nephrolithiasis. 6. Complex cystic left adnexal lesion has a hypermetabolic focus along its upper margin. This is new and I am uncertain whether it represents a new lymph node along the upper margin, or true nodularity within the lesion itself.   Electronically Signed   By: Sherryl Barters M.D.   On: 08/29/2013 09:56    ASSESSMENT:  #1. Progressive primary peritoneal carcinomatosis with involvement of cervical, thoracic, and abdominal lymph nodes without any reoccurrence of abdominal masses. 2. Lower extremity vasculitis with subcutaneous urticaria, improved on prednisone.  3. Peripheral neuropathy, better controlled on Cymbalta 30 mg at bedtime with lower analgesic requirement.  4. Anemia secondary to chemotherapy, improved from 9.3-11.1 after Aranesp was given on 04/16/2013.  5. Pruritus following treatment, it by Atarax, prednisone, and famotidine  6. Symptomatic allergic rhinitis with reactive left posterior cervical lymphadenopathy.  7. Shortness of breath on exertion probably due to anemia and possible fluid overload.  8. Tremors secondary to metoclopramide 9. Discrepancy regarding opioid prescription.    PLAN:  #1. Discontinue Avastin. #2. Continue prednisone twice a day. #3. In the presence of the patient, her husband, or daughter, and son-in-law, alternative treatments were discussed. #4. Baseline MUGA scan. #5. Begin salvage chemotherapy with Doxil 40 mg per meter squared monthly on 09/10/2013 treating platinum resistant recurrent disease.   All questions were answered. The patient knows to call the clinic with any problems, questions or concerns. We can certainly see the patient much sooner if necessary.   I spent 40 minutes counseling the patient face to face. The total time spent in the appointment was 55 minutes.    Farrel Gobble, MD 08/31/2013 10:00 AM  DISCLAIMER:  This note was dictated with voice  recognition software.  Similar sounding words can inadvertently be transcribed inaccurately and may not be corrected upon review.

## 2013-09-04 ENCOUNTER — Encounter (HOSPITAL_COMMUNITY): Payer: PRIVATE HEALTH INSURANCE | Attending: Internal Medicine

## 2013-09-04 DIAGNOSIS — Z951 Presence of aortocoronary bypass graft: Secondary | ICD-10-CM | POA: Insufficient documentation

## 2013-09-04 DIAGNOSIS — C786 Secondary malignant neoplasm of retroperitoneum and peritoneum: Secondary | ICD-10-CM | POA: Insufficient documentation

## 2013-09-04 DIAGNOSIS — I252 Old myocardial infarction: Secondary | ICD-10-CM | POA: Insufficient documentation

## 2013-09-04 DIAGNOSIS — T451X5A Adverse effect of antineoplastic and immunosuppressive drugs, initial encounter: Secondary | ICD-10-CM | POA: Insufficient documentation

## 2013-09-04 DIAGNOSIS — E039 Hypothyroidism, unspecified: Secondary | ICD-10-CM | POA: Insufficient documentation

## 2013-09-04 DIAGNOSIS — G8929 Other chronic pain: Secondary | ICD-10-CM | POA: Insufficient documentation

## 2013-09-04 DIAGNOSIS — R110 Nausea: Secondary | ICD-10-CM | POA: Insufficient documentation

## 2013-09-04 DIAGNOSIS — I2510 Atherosclerotic heart disease of native coronary artery without angina pectoris: Secondary | ICD-10-CM | POA: Insufficient documentation

## 2013-09-04 DIAGNOSIS — C801 Malignant (primary) neoplasm, unspecified: Secondary | ICD-10-CM

## 2013-09-04 DIAGNOSIS — J069 Acute upper respiratory infection, unspecified: Secondary | ICD-10-CM | POA: Insufficient documentation

## 2013-09-04 NOTE — Progress Notes (Signed)
Teaching done over the phone. Teaching papers to be picked up tomorrow 09/05/13 by patient when patient comes for MUGA scan. Allergy list updated. Consent to be signed tomorrow 6/3 or on day of chemo 6/8. Distress screening to be completed in 1 week also.

## 2013-09-04 NOTE — Patient Instructions (Addendum)
[facility]   CHEMOTHERAPY INSTRUCTIONS  Doxil - bone marrow suppression (lowers white blood cells (fight infection), lowers red blood cells (make up your blood), lowers platelets (help blood to clot). Nausea/Vomiting, Cardiotoxicity (can weaken the left ventricle - pumping muscle of the heart) therefore we will have to do MUGA scans to see if your heart muscle is weakening prior to treatment as well as periodically during treatment with Doxil, Arrhythmia, Hair Loss, hyperuricemia (increase uric acid levels in blood), radiation recall (turn the area red where you previously had radiation), hand-foot syndrome (your palms of hands and soles of feet can get bright red, burn, peel, blister, hurt) - avoid friction to your hands/feet, lotion them twice a day, avoid warm/hot temps to your hands/feet, do not apply Vaseline to your hands or feet if they are burning. Vaseline/petroleum traps heat. Avoid hot showers. This medication can make your eyes sensitive to sunlight. Wear sunglasses.  This drug may turn your urine pink/red for a few voids after receiving it. Your urine should begin to turn back to a yellow or light yellow color after a few voids.    POTENTIAL SIDE EFFECTS OF TREATMENT: Constipation, Diarrhea, Hiccups, Heartburn, Mouth Sores, Changes in Character of Skin and Nails (brittleness, dryness,etc.)    EDUCATIONAL MATERIALS GIVEN AND REVIEWED: Specific Instructions Sheets: Doxil   SELF CARE ACTIVITIES WHILE ON CHEMOTHERAPY: Increase your fluid intake 48 hours prior to treatment and drink at least 2 quarts per day after treatment., No alcohol intake., No aspirin or other medications unless approved by your oncologist., Eat foods that are light and easy to digest., Eat foods at cold or room temperature., No fried, fatty, or spicy foods immediately before or after treatment., Have teeth cleaned professionally before starting treatment. Keep dentures and partial  plates clean., Use soft toothbrush and do not use mouthwashes that contain alcohol. Biotene is a good mouthwash. Use warm salt water gargles (1 teaspoon salt per 1 quart warm water) before and after meals and at bedtime. Or you may rinse with 2 tablespoons of three -percent hydrogen peroxide mixed in eight ounces of water., Always use sunscreen with SPF (Sun Protection Factor) of 30 or higher., Use your nausea medication as directed to prevent nausea., Use your stool softener or laxative as directed to prevent constipation. and Use your anti-diarrheal medication as directed to stop diarrhea.   Please wash your hands for at least 30 seconds using warm soapy water. Handwashing is the #1 way to prevent the spread of germs. Stay away from sick people or people who are getting over a cold. If you develop respiratory systems such as green/yellow mucus production or productive cough or persistent cough let us know and we will see if you need an antibiotic. It is a good idea to keep a pair of gloves on when going into grocery stores/[employer] to decrease your risk of coming into contact with germs on the carts, etc. Carry alcohol hand gel with you at all times and use it frequently if out in public. All foods need to be cooked thoroughly. No raw foods. No medium or undercooked meats, eggs. If your food is cooked medium well, it does not need to be hot pink or saturated with bloody liquid at all. Vegetables and fruits need to be washed/rinsed under the faucet with a dish detergent before being consumed. You can eat raw fruits and vegetables unless we tell you otherwise but it would be best if you cooked them or  bought frozen. Do not eat off of salad bars or hot bars unless you really trust the cleanliness of the restaurant. If you need dental work, please let us know before you go for your appointment so that we can coordinate the best possible time for you in regards to your chemo regimen. You need to also let your dentist  know that you are actively taking chemo. We may need to do labs prior to your dental appointment. We also want your bowels moving at least every other day. If this is not happening, we need to know so that we can get you on a bowel regimen to help you go.    MEDICATIONS: You have been given prescriptions for the following medications:  Zofran $Remov'8mg'HqyyRS$  tablet. Take 1 tablet every 8 hours if needed for nausea/vomiting.     Over-the-Counter Meds:   Colace - this is a stool softener. Take $RemoveBefore'100mg'EnBOFDaqAzMnW$  capsule 2-6 times a day as needed. If you have to take more than 6 capsules of Colace a day call the [facility].  Senna - this is a mild laxative used to treat mild constipation. May take 2 tabs by mouth daily or up to twice a day as needed for mild constipation.  Milk of Magnesia - this is a laxative used to treat moderate to severe constipation. May take 2-4 tablespoons every 8 hours as needed. May increase to 8 tablespoons x 1 dose and if no bowel movement call the [facility].  Imodium - this is for diarrhea. Take 2 tabs after 1st loose stool and then 1 tab after each loose stool until you go a total of 12 hours without a loose stool. Call [facility] if loose stools continue.   SYMPTOMS TO REPORT AS SOON AS POSSIBLE AFTER TREATMENT:  FEVER GREATER THAN 100.5 F  CHILLS WITH OR WITHOUT FEVER  NAUSEA AND VOMITING THAT IS NOT CONTROLLED WITH YOUR NAUSEA MEDICATION  UNUSUAL SHORTNESS OF BREATH  UNUSUAL BRUISING OR BLEEDING  TENDERNESS IN MOUTH AND THROAT WITH OR WITHOUT PRESENCE OF ULCERS  URINARY PROBLEMS  BOWEL PROBLEMS  UNUSUAL RASH    Wear comfortable clothing and clothing appropriate for easy access to any Portacath or PICC line. Let us know if there is anything that we can do to make your therapy better!      I have been informed and understand all of the instructions given to me and have received a copy. I have been instructed to call the clinic [phone] or my family  physician as soon as possible for continued medical care, if indicated. I do not have any more questions at this time but understand that I may call the [facility] or the Patient Navigator at [phone] during office hours should I have questions or need assistance in obtaining follow-up care.      _________________________________________      _______________     __________ Signature of Patient or Authorized Representative        Date                            Time      _________________________________________ Nurse's Signature      Doxorubicin Liposomal injection What is this medicine? LIPOSOMAL DOXORUBICIN (LIP oh som al dox oh ROO bi sin) is a chemotherapy drug. This medicine is used to treat many kinds of cancer like Kaposi's sarcoma, multiple myeloma, and ovarian cancer. This medicine may be used  for other purposes; ask your health care provider or pharmacist if you have questions. COMMON BRAND NAME(S): Doxil, Lipodox What should I tell my health care provider before I take this medicine? They need to know if you have any of these conditions: -blood disorders -heart disease -infection (especially a virus infection such as chickenpox, cold sores, or herpes) -liver disease -recent or ongoing radiation therapy -an unusual or allergic reaction to doxorubicin, other chemotherapy agents, soybeans, other medicines, foods, dyes, or preservatives -pregnant or trying to get pregnant -breast-feeding How should I use this medicine? This drug is given as an infusion into a vein. It is administered in a hospital or clinic by a specially trained health care professional. If you have pain, swelling, burning or any unusual feeling around the site of your injection, tell your health care professional right away. Talk to your pediatrician regarding the use of this medicine in children. Special care may be needed. Overdosage: If you think you have taken too much of this medicine  contact a poison control center or emergency room at once. NOTE: This medicine is only for you. Do not share this medicine with others. What if I miss a dose? It is important not to miss your dose. Call your doctor or health care professional if you are unable to keep an appointment. What may interact with this medicine? Do not take this medicine with any of the following medications: -zidovudine This medicine may also interact with the following medications: -medicines to increase blood counts like filgrastim, pegfilgrastim, sargramostim -vaccines Talk to your doctor or health care professional before taking any of these medicines: -acetaminophen -aspirin -ibuprofen -ketoprofen -naproxen This list may not describe all possible interactions. Give your health care provider a list of all the medicines, herbs, non-prescription drugs, or dietary supplements you use. Also tell them if you smoke, drink alcohol, or use illegal drugs. Some items may interact with your medicine. What should I watch for while using this medicine? Your condition will be monitored carefully while you are receiving this medicine. You will need important blood work done while you are taking this medicine. This drug may make you feel generally unwell. This is not uncommon, as chemotherapy can affect healthy cells as well as cancer cells. Report any side effects. Continue your course of treatment even though you feel ill unless your doctor tells you to stop. Your urine may turn orange-red for a few days after your dose. This is not blood. If your urine is dark or brown, call your doctor. In some cases, you may be given additional medicines to help with side effects. Follow all directions for their use. Call your doctor or health care professional for advice if you get a fever (100.5 degrees F or higher), chills or sore throat, or other symptoms of a cold or flu. Do not treat yourself. This drug decreases your body's ability to  fight infections. Try to avoid being around people who are sick. This medicine may increase your risk to bruise or bleed. Call your doctor or health care professional if you notice any unusual bleeding. Be careful brushing and flossing your teeth or using a toothpick because you may get an infection or bleed more easily. If you have any dental work done, tell your dentist you are receiving this medicine. Avoid taking products that contain aspirin, acetaminophen, ibuprofen, naproxen, or ketoprofen unless instructed by your doctor. These medicines may hide a fever. Men and women of childbearing age should use effective birth control methods  while using taking this medicine. Do not become pregnant while taking this medicine. There is a potential for serious side effects to an unborn child. Talk to your health care professional or pharmacist for more information. Do not breast-feed an infant while taking this medicine. Talk to your doctor about your risk of cancer. You may be more at risk for certain types of cancers if you take this medicine. What side effects may I notice from receiving this medicine? Side effects that you should report to your doctor or health care professional as soon as possible: -allergic reactions like skin rash, itching or hives, swelling of the face, lips, or tongue -low blood counts - this medicine may decrease the number of white blood cells, red blood cells and platelets. You may be at increased risk for infections and bleeding. -signs of hand-foot syndrome - tingling or burning, redness, flaking, swelling, small blisters, or small sores on the palms of your hands or the soles of your feet -signs of infection - fever or chills, cough, sore throat, pain or difficulty passing urine -signs of decreased platelets or bleeding - bruising, pinpoint red spots on the skin, black, tarry stools, blood in the urine -signs of decreased red blood cells - unusually weak or tired, fainting  spells, lightheadedness -back pain, chills, facial flushing, fever, headache, tightness in the chest or throat during the infusion -breathing problems -chest pain -fast, irregular heartbeat -mouth pain, redness, sores -pain, swelling, redness at site where injected -pain, tingling, numbness in the hands or feet -swelling of ankles, feet, or hands -vomiting Side effects that usually do not require medical attention (report to your doctor or health care professional if they continue or are bothersome): -diarrhea -hair loss -loss of appetite -nail discoloration or damage -nausea -red or watery eyes -red colored urine -stomach upset This list may not describe all possible side effects. Call your doctor for medical advice about side effects. You may report side effects to FDA at 1-800-FDA-1088. Where should I keep my medicine? This drug is given in a hospital or clinic and will not be stored at home. NOTE: This sheet is a summary. It may not cover all possible information. If you have questions about this medicine, talk to your doctor, pharmacist, or health care provider.  2014, [employer]. (2011-12-10 10:12:56)

## 2013-09-05 ENCOUNTER — Encounter (HOSPITAL_COMMUNITY)
Admission: RE | Admit: 2013-09-05 | Discharge: 2013-09-05 | Disposition: A | Discharge status: Home / Self Care | Payer: PRIVATE HEALTH INSURANCE | Source: Ambulatory Visit | Attending: Hematology and Oncology | Admitting: Hematology and Oncology

## 2013-09-05 ENCOUNTER — Encounter (HOSPITAL_COMMUNITY): Payer: Self-pay

## 2013-09-05 DIAGNOSIS — C801 Malignant (primary) neoplasm, unspecified: Secondary | ICD-10-CM

## 2013-09-05 DIAGNOSIS — C786 Secondary malignant neoplasm of retroperitoneum and peritoneum: Secondary | ICD-10-CM | POA: Insufficient documentation

## 2013-09-05 DIAGNOSIS — Z9221 Personal history of antineoplastic chemotherapy: Secondary | ICD-10-CM | POA: Insufficient documentation

## 2013-09-05 MED ORDER — TECHNETIUM TC 99M-LABELED RED BLOOD CELLS IV KIT
25.0000 | PACK | Freq: Once | INTRAVENOUS | Status: AC | PRN
  Administered 2013-09-05: 25 via INTRAVENOUS

## 2013-09-05 MED ORDER — HEPARIN SOD (PORK) LOCK FLUSH 100 UNIT/ML IV SOLN
INTRAVENOUS | Status: AC
  Administered 2013-09-05: 500 [IU]
  Filled 2013-09-05: qty 5

## 2013-09-05 NOTE — Progress Notes (Signed)
Consent signed for Doxil, chemo calendar given to patient, and distress screening done.

## 2013-09-09 NOTE — Progress Notes (Signed)
Erika Hilding, MD [place] [address]  Cancer of unknown origin - Plan: aspirin 81 MG tablet, Probiotic Product (PROBIOTIC DAILY PO), ondansetron (ZOFRAN) 8 MG tablet  Chemotherapy-induced nausea - Plan: aspirin 81 MG tablet, Probiotic Product (PROBIOTIC DAILY PO), ondansetron (ZOFRAN) 8 MG tablet  URI (upper respiratory infection) - Plan: amoxicillin-clavulanate (AUGMENTIN) 875-125 MG per tablet  CURRENT THERAPY: Starting Doxil salvage therapy today (09/10/2013)  INTERVAL HISTORY: Erika Moreno 68 y.o. 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 Avastin maintenance was started on 04/16/2013 an effort to prolong progression free survival.  PET scan on 08/29/2013 demonstrated progression of disease and therefore, Avastin was discontinued and last given on 08/20/2013.  She will start salvage Doxil every 28 days for platinum resistant recurrent disease.    Cancer of unknown origin   11/15/2012 - 02/21/2013 Chemotherapy Carboplatin/Paclitaxel   11/15/2012 Treatment Plan Change Transfer of care to [facility] from [place]   03/22/2013 Remission PET scan- CR   04/16/2013 - 08/20/2013 Chemotherapy Avastin maintenance every 21 days   08/29/2013 Progression PET- progression of disease   09/05/2013 Imaging MUGA demonstrates EF of 63%   09/10/2013 -  Chemotherapy Doxil every 28 days   I personally reviewed and went over laboratory results with the patient.  The results are noted within this dictation.  I personally reviewed and went over radiographic studies with the patient.  The results are noted within this dictation.  She wishes to go over the images and this was done with her today.  Understandably, Anahli is anxious about starting her new chemotherapy regimen, Doxil.  She  has read the side effects of this medication beforehand.  I provided her education regarding the medication, its risks, benefits, alternatives, and side effects.  Doxil is typically well tolerated.  She is educated regarding hand-foot syndrome.   She notes a "scrtachy" throat at night.  She reports that she is able to clear phlegm from her nose and it is yellow in nature.  PET scan confirms a left maxillary sinusitis.  She denies any fevers or chills.  As a result, I will treat this with an antibiotic.  Oncologically, she otherwise denies any complaints and ROS questioning is negative.    Past Medical History  Diagnosis Date  . 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 Cervical cancer 1984  . Myocardial infarction 20 yrs ago  . Coronary artery disease   . Anxiety   . Chronic back pain   . Depression   . GERD (gastroesophageal reflux disease)   . Hypertension   . Hypothyroidism   . Anemia   . Vitamin B 12 deficiency   . Vitamin D deficiency disease     has Cancer of unknown origin; Anemia due to chemotherapy; Normocytic anemia; Antineoplastic chemotherapy induced pancytopenia; Dyspnea on exertion; and Neuropathy due to chemotherapeutic drug on her problem list.     is allergic to avastin; morphine and related; reglan; and sulfa antibiotics.  Ms. Soule had no medications administered during this visit.  Past Surgical History  Procedure Laterality Date  . Heart bypass      triple bypass with stents  . Coronary artery bypass graft      3 vessels  . Cardiac catheterization  stents x3  . Abdominal hysterectomy    . Portacath placement Right 12/06/2012    Procedure: INSERTION PORT-A-CATH;  Surgeon: Donato Heinz, MD;  Location: [facility];  Service: General;  Laterality: Right;    Denies any headaches, dizziness, double vision, fevers, chills, night sweats, nausea, vomiting, diarrhea, constipation, chest pain, heart palpitations, shortness of breath,  blood in stool, black tarry stool, urinary pain, urinary burning, urinary frequency, hematuria.   PHYSICAL EXAMINATION  ECOG PERFORMANCE STATUS: 1 - Symptomatic but completely ambulatory  There were no vitals filed for this visit.  GENERAL:alert, no distress, well nourished, well developed, comfortable, cooperative and smiling SKIN: skin color, texture, turgor are normal, no rashes or significant lesions HEAD: Normocephalic, No masses, lesions, tenderness or abnormalities EYES: normal, PERRLA, EOMI, Conjunctiva are pink and non-injected EARS: External ears normal OROPHARYNX:posterior pharynx erythema  NECK: supple, trachea midline LYMPH:  not examined BREAST:not examined LUNGS: not examined HEART: not examined ABDOMEN:not examined BACK: Back symmetric, no curvature. EXTREMITIES:no skin discoloration, no cyanosis  NEURO: alert & oriented x 3 with fluent speech, no focal motor/sensory deficits, gait normal   LABORATORY DATA: CBC    Component Value Date/Time   WBC 4.1 09/10/2013 0830   RBC 3.27* 09/10/2013 0830   HGB 10.3* 09/10/2013 0830   HCT 31.0* 09/10/2013 0830   PLT 142* 09/10/2013 0830   MCV 94.8 09/10/2013 0830   MCH 31.5 09/10/2013 0830   MCHC 33.2 09/10/2013 0830   RDW 14.7 09/10/2013 0830   LYMPHSABS 1.1 09/10/2013 0830   MONOABS 0.3 09/10/2013 0830   EOSABS 0.1 09/10/2013 0830   BASOSABS 0.0 09/10/2013 0830      Chemistry      Component Value Date/Time   NA 139 09/10/2013 0830   K 4.5 09/10/2013 0830   CL 98 09/10/2013 0830   CO2 28 09/10/2013 0830   BUN 39* 09/10/2013 0830   CREATININE 1.18* 09/10/2013 0830      Component Value Date/Time   CALCIUM 9.7 09/10/2013 0830   ALKPHOS 63 09/10/2013 0830   AST 14 09/10/2013 0830   ALT 10 09/10/2013 0830   BILITOT 0.2* 09/10/2013 0830     Lab Results  Component Value Date   CA125 100.4* 08/20/2013     RADIOGRAPHIC STUDIES:  08/29/2013  CLINICAL DATA: Subsequent treatment strategy for ovarian cancer.  Breast cancer. Peritoneal carcinomatosis.    EXAM:  NUCLEAR MEDICINE PET SKULL BASE TO THIGH  TECHNIQUE:  7.3 mCi F-18 FDG was injected intravenously. Full-ring PET imaging  was performed from the skull base to thigh after the radiotracer. CT  data was obtained and used for attenuation correction and anatomic  localization.  FASTING BLOOD GLUCOSE: Value: 89 mg/dl  COMPARISON: Multiple exams, including 06/12/2013 and 03/22/2013  FINDINGS:  NECK  Right station 3 lymph node, 0.7 cm in short axis, maximum standard  uptake value 10.1. Focally high activity corresponding to a small  lymph node in the left supraclavicular adipose tissue, 0.6 cm in  short axis, maximum standard uptake value 6.6.  Incidental chronic left maxillary sinusitis with a large mucous  retention cyst.  CHEST  Scattered axillary adenopathy with a lateral left axillary node, 0.8  cm in short axis, maximum standard uptake value 10.2. Hypermetabolic  right subpectoral node along the subclavian vessels, 0.5 cm in short  axis, maximum standard uptake value 4.3.  Hypermetabolic precarinal, subcarinal, and right infrahilar  adenopathy. Precarinal lymph node maximum standard uptake value  13.9, short axis diameter 1.3 cm.  Faint hypermetabolism  in a bandlike density in the lingula, maximum  standard uptake value 2.9.  Cardiomegaly noted.  ABDOMEN/PELVIS  The gastrohepatic ligament lymph node maximum standard uptake value  4.6, short axis diameter 0.8 cm. Left lower periaortic adenopathy,  short axis diameter 0.8 cm, maximum standard uptake value 11.6.  Small left common iliac lymph nodes, maximum standard uptake value  6.6, short axis diameter 0.7 cm. Hypermetabolic focus above the  cystic left adnexal lesion, just along its superior margin, maximum  standard uptake value 8.5. The cystic lesion itself measures 7.3 x  4.0 cm, with higher density posterior component.  Scattered colonic and small bowel activity, likely physiologic.  Small foci of hypermetabolic  activity in the transverse colon are  somewhat focal and may represent hypermetabolic polyps, but are  poorly correlated on the CT data.  Nonobstructive right nephrolithiasis. New mesenteric stranding and  scattered small lymph nodes observed in the central mesentery, but  without central mesenteric hypermetabolic activity.  SKELETON  No focal hypermetabolic activity to suggest skeletal metastasis.  IMPRESSION:  1. Scattered increasingly hypermetabolic lymph nodes in the neck,  chest, abdomen, and pelvis indicative of nodal spread of cancer. No  compelling pattern of peritoneal spread of the cancer.  2. New central mesenteric stranding with scattered mesenteric lymph  nodes but without abnormal high activity within these lymph nodes.  The cause of the mesenteric stranding is uncertain; pancreatitis is  not excluded.  3. Chronic left maxillary sinusitis. Cardiomegaly.  4. Scattered foci of increased activity in the transverse colon  could represent polyps, although were not hypermetabolic previously  and accordingly are more likely physiologic.  5. Nonobstructive right nephrolithiasis.  6. Complex cystic left adnexal lesion has a hypermetabolic focus  along its upper margin. This is new and I am uncertain whether it  represents a new lymph node along the upper margin, or true  nodularity within the lesion itself.  Electronically Signed  By: Sherryl Barters M.D.  On: 08/29/2013 09:56    ASSESSMENT: 1. Progressive primary peritoneal carcinomatosis with involvement of cervical, thoracic, and abdominal lymph nodes without any reoccurrence of abdominal masses. 2. Lower extremity vasculitis with subcutaneous urticaria, improved on prednisone.  3. Peripheral neuropathy, better controlled on Cymbalta 30 mg at bedtime with lower analgesic requirement.  4. Anemia secondary to chemotherapy, improved from 9.3-11.1 after Aranesp was given on 04/16/2013.  5. Pruritus following treatment, it by  Atarax, prednisone, and famotidine  6. Symptomatic allergic rhinitis with reactive left posterior cervical lymphadenopathy.  7. Shortness of breath on exertion probably due to anemia and possible fluid overload.  8. Tremors secondary to metoclopramide  9. Discrepancy regarding opioid prescription. 10. Chronic sinusitis of left maxilla 11. Post-nasal drip  Patient Active Problem List   Diagnosis Date Noted  . Normocytic anemia 02/10/2013  . Antineoplastic chemotherapy induced pancytopenia 02/10/2013  . Dyspnea on exertion 02/10/2013  . Neuropathy due to chemotherapeutic drug 02/10/2013  . Anemia due to chemotherapy 01/24/2013  . Cancer of unknown origin 11/14/2012     PLAN:  1. I personally reviewed and went over laboratory results with the patient.  The results are noted within this dictation. 2. I personally reviewed and went over radiographic studies with the patient.  The results are noted within this dictation.  She was given a copy of her PET scan report.   3. Pre-chemo labs: CBC diff, CMET, CA 125 4. Refill on Zofran 8 mg PO #45 with 2 refills 5. Rx for Augmentin x 7 days for post-nasal  drip and "scratchy" throat" 6. Discussion regarding Doxil salvage therapy 7. Patient education regarding hand-foot syndrome 8. Return in 1 and 4 weeks for follow-up    THERAPY PLAN:  Anjelika's recent restaging PET scan demonstrated progression of disease.  Therefore, maintenance Avastin was discontinued in preparation to move on will salvage Doxil for progressive, recurrent platinum resistant disease.  All questions were answered. The patient knows to call the clinic with any problems, questions or concerns. We can certainly see the patient much sooner if necessary.  Patient and plan discussed with Dr. Farrel Gobble and he is in agreement with the aforementioned.   [facility] 09/10/2013

## 2013-09-10 ENCOUNTER — Encounter (HOSPITAL_BASED_OUTPATIENT_CLINIC_OR_DEPARTMENT_OTHER): Payer: PRIVATE HEALTH INSURANCE | Admitting: Oncology

## 2013-09-10 ENCOUNTER — Encounter (HOSPITAL_BASED_OUTPATIENT_CLINIC_OR_DEPARTMENT_OTHER): Payer: PRIVATE HEALTH INSURANCE

## 2013-09-10 ENCOUNTER — Encounter (HOSPITAL_COMMUNITY): Payer: Self-pay | Admitting: Oncology

## 2013-09-10 VITALS — BP 117/72 | HR 62 | Temp 97.7°F | Resp 16 | Wt 140.6 lb

## 2013-09-10 DIAGNOSIS — R0602 Shortness of breath: Secondary | ICD-10-CM

## 2013-09-10 DIAGNOSIS — C801 Malignant (primary) neoplasm, unspecified: Secondary | ICD-10-CM

## 2013-09-10 DIAGNOSIS — R259 Unspecified abnormal involuntary movements: Secondary | ICD-10-CM

## 2013-09-10 DIAGNOSIS — J069 Acute upper respiratory infection, unspecified: Secondary | ICD-10-CM

## 2013-09-10 DIAGNOSIS — C482 Malignant neoplasm of peritoneum, unspecified: Secondary | ICD-10-CM

## 2013-09-10 DIAGNOSIS — G609 Hereditary and idiopathic neuropathy, unspecified: Secondary | ICD-10-CM

## 2013-09-10 DIAGNOSIS — C786 Secondary malignant neoplasm of retroperitoneum and peritoneum: Secondary | ICD-10-CM

## 2013-09-10 DIAGNOSIS — C778 Secondary and unspecified malignant neoplasm of lymph nodes of multiple regions: Secondary | ICD-10-CM

## 2013-09-10 DIAGNOSIS — J329 Chronic sinusitis, unspecified: Secondary | ICD-10-CM

## 2013-09-10 DIAGNOSIS — D6481 Anemia due to antineoplastic chemotherapy: Secondary | ICD-10-CM

## 2013-09-10 DIAGNOSIS — T451X5A Adverse effect of antineoplastic and immunosuppressive drugs, initial encounter: Secondary | ICD-10-CM

## 2013-09-10 DIAGNOSIS — R110 Nausea: Secondary | ICD-10-CM

## 2013-09-10 DIAGNOSIS — Z5111 Encounter for antineoplastic chemotherapy: Secondary | ICD-10-CM

## 2013-09-10 LAB — CBC WITH DIFFERENTIAL/PLATELET
BASOS ABS: 0 10*3/uL (ref 0.0–0.1)
Basophils Relative: 0 % (ref 0–1)
EOS ABS: 0.1 10*3/uL (ref 0.0–0.7)
EOS PCT: 1 % (ref 0–5)
HCT: 31 % — ABNORMAL LOW (ref 36.0–46.0)
Hemoglobin: 10.3 g/dL — ABNORMAL LOW (ref 12.0–15.0)
LYMPHS ABS: 1.1 10*3/uL (ref 0.7–4.0)
Lymphocytes Relative: 26 % (ref 12–46)
MCH: 31.5 pg (ref 26.0–34.0)
MCHC: 33.2 g/dL (ref 30.0–36.0)
MCV: 94.8 fL (ref 78.0–100.0)
Monocytes Absolute: 0.3 10*3/uL (ref 0.1–1.0)
Monocytes Relative: 6 % (ref 3–12)
NEUTROS PCT: 67 % (ref 43–77)
Neutro Abs: 2.7 10*3/uL (ref 1.7–7.7)
PLATELETS: 142 10*3/uL — AB (ref 150–400)
RBC: 3.27 MIL/uL — ABNORMAL LOW (ref 3.87–5.11)
RDW: 14.7 % (ref 11.5–15.5)
WBC: 4.1 10*3/uL (ref 4.0–10.5)

## 2013-09-10 LAB — COMPREHENSIVE METABOLIC PANEL
ALBUMIN: 3.8 g/dL (ref 3.5–5.2)
ALT: 10 U/L (ref 0–35)
AST: 14 U/L (ref 0–37)
Alkaline Phosphatase: 63 U/L (ref 39–117)
BUN: 39 mg/dL — ABNORMAL HIGH (ref 6–23)
CALCIUM: 9.7 mg/dL (ref 8.4–10.5)
CO2: 28 mEq/L (ref 19–32)
Chloride: 98 mEq/L (ref 96–112)
Creatinine, Ser: 1.18 mg/dL — ABNORMAL HIGH (ref 0.50–1.10)
GFR calc non Af Amer: 46 mL/min — ABNORMAL LOW (ref >90)
GFR, EST AFRICAN AMERICAN: 54 mL/min — AB (ref >90)
GLUCOSE: 141 mg/dL — AB (ref 70–99)
Potassium: 4.5 mEq/L (ref 3.7–5.3)
Sodium: 139 mEq/L (ref 137–147)
Total Bilirubin: 0.2 mg/dL — ABNORMAL LOW (ref 0.3–1.2)
Total Protein: 7.2 g/dL (ref 6.0–8.3)

## 2013-09-10 MED ORDER — SODIUM CHLORIDE 0.9 % IJ SOLN
10.0000 mL | INTRAMUSCULAR | Status: DC | PRN
  Administered 2013-09-10: 10 mL

## 2013-09-10 MED ORDER — HEPARIN SOD (PORK) LOCK FLUSH 100 UNIT/ML IV SOLN
500.0000 [IU] | Freq: Once | INTRAVENOUS | Status: AC | PRN
  Administered 2013-09-10: 500 [IU]
  Filled 2013-09-10: qty 5

## 2013-09-10 MED ORDER — DEXAMETHASONE SODIUM PHOSPHATE 10 MG/ML IJ SOLN: 10.0000 mg | Freq: Once | INTRAMUSCULAR | Status: DC

## 2013-09-10 MED ORDER — DOXORUBICIN HCL LIPOSOMAL CHEMO INJECTION 2 MG/ML
40.0000 mg/m2 | Freq: Once | INTRAVENOUS | Status: AC
  Administered 2013-09-10: 70 mg via INTRAVENOUS
  Filled 2013-09-10: qty 35

## 2013-09-10 MED ORDER — ONDANSETRON HCL 40 MG/20ML IJ SOLN: 8.0000 mg | Freq: Once | INTRAMUSCULAR | Status: DC

## 2013-09-10 MED ORDER — SODIUM CHLORIDE 0.9 % IV SOLN
Freq: Once | INTRAVENOUS | Status: AC
  Administered 2013-09-10: 09:00:00 via INTRAVENOUS

## 2013-09-10 MED ORDER — AMOXICILLIN-POT CLAVULANATE 875-125 MG PO TABS: 1.0000 | ORAL_TABLET | Freq: Two times a day (BID) | ORAL | Status: AC

## 2013-09-10 MED ORDER — ONDANSETRON HCL 8 MG PO TABS: 8.0000 mg | ORAL_TABLET | Freq: Three times a day (TID) | ORAL | Status: DC | PRN

## 2013-09-10 MED ORDER — SODIUM CHLORIDE 0.9 % IV SOLN
Freq: Once | INTRAVENOUS | Status: AC
  Administered 2013-09-10: 8 mg via INTRAVENOUS
  Filled 2013-09-10: qty 4

## 2013-09-10 NOTE — Patient Instructions (Signed)
[facility] Discharge Instructions  RECOMMENDATIONS MADE BY THE CONSULTANT AND ANY TEST RESULTS WILL BE SENT TO YOUR REFERRING PHYSICIAN.  EXAM FINDINGS BY THE PHYSICIAN TODAY AND SIGNS OR SYMPTOMS TO REPORT TO CLINIC OR PRIMARY PHYSICIAN: Exam and findings as discussed by Robynn Pane, PA-C.   Report fevers, chills, uncontrolled nausea, vomiting, increased pain or shortness of breath or other concerns.[name]  MEDICATIONS PRESCRIBED:  Ondansetron - e-scribed to your pharmacy. Augmentin - take as directed for 7 days.  INSTRUCTIONS/FOLLOW-UP: Return as scheduled.  Thank you for choosing [facility] to provide your oncology and hematology care.  To afford each patient quality time with our providers, please arrive at least 15 minutes before your scheduled appointment time.  With your help, our goal is to use those 15 minutes to complete the necessary work-up to ensure our physicians have the information they need to help with your evaluation and healthcare recommendations.    Effective January 1st, 2014, we ask that you re-schedule your appointment with our physicians should you arrive 10 or more minutes late for your appointment.  We strive to give you quality time with our providers, and arriving late affects you and other patients whose appointments are after yours.    Again, thank you for choosing [facility].  Our hope is that these requests will decrease the amount of time that you wait before being seen by our physicians.       _____________________________________________________________  Should you have questions after your visit to [facility], please contact our office at [phone] between the hours of 8:30 a.m. and 5:00 p.m.  Voicemails left after 4:30 p.m. will not be returned until the following business day.  For prescription refill requests, have your pharmacy contact our office with your prescription refill request.

## 2013-09-10 NOTE — Progress Notes (Signed)
Tolerated well

## 2013-09-11 LAB — CA 125: CA 125: 118.3 U/mL — ABNORMAL HIGH (ref 0.0–30.2)

## 2013-09-17 ENCOUNTER — Encounter (HOSPITAL_BASED_OUTPATIENT_CLINIC_OR_DEPARTMENT_OTHER): Payer: PRIVATE HEALTH INSURANCE

## 2013-09-17 ENCOUNTER — Ambulatory Visit (HOSPITAL_COMMUNITY): Payer: PRIVATE HEALTH INSURANCE

## 2013-09-17 ENCOUNTER — Encounter (HOSPITAL_COMMUNITY): Payer: Self-pay

## 2013-09-17 ENCOUNTER — Encounter (HOSPITAL_COMMUNITY): Payer: PRIVATE HEALTH INSURANCE

## 2013-09-17 ENCOUNTER — Other Ambulatory Visit (HOSPITAL_COMMUNITY): Payer: PRIVATE HEALTH INSURANCE

## 2013-09-17 VITALS — BP 88/46 | HR 92 | Temp 97.9°F | Resp 16 | Wt 141.4 lb

## 2013-09-17 DIAGNOSIS — C778 Secondary and unspecified malignant neoplasm of lymph nodes of multiple regions: Secondary | ICD-10-CM

## 2013-09-17 DIAGNOSIS — G609 Hereditary and idiopathic neuropathy, unspecified: Secondary | ICD-10-CM

## 2013-09-17 DIAGNOSIS — D6481 Anemia due to antineoplastic chemotherapy: Secondary | ICD-10-CM

## 2013-09-17 DIAGNOSIS — C801 Malignant (primary) neoplasm, unspecified: Secondary | ICD-10-CM

## 2013-09-17 DIAGNOSIS — C786 Secondary malignant neoplasm of retroperitoneum and peritoneum: Secondary | ICD-10-CM

## 2013-09-17 DIAGNOSIS — T451X5A Adverse effect of antineoplastic and immunosuppressive drugs, initial encounter: Secondary | ICD-10-CM

## 2013-09-17 LAB — CBC WITH DIFFERENTIAL/PLATELET
Basophils Absolute: 0 10*3/uL (ref 0.0–0.1)
Basophils Relative: 0 % (ref 0–1)
EOS PCT: 1 % (ref 0–5)
Eosinophils Absolute: 0.1 10*3/uL (ref 0.0–0.7)
HCT: 32.3 % — ABNORMAL LOW (ref 36.0–46.0)
HEMOGLOBIN: 10.8 g/dL — AB (ref 12.0–15.0)
Lymphocytes Relative: 13 % (ref 12–46)
Lymphs Abs: 0.8 10*3/uL (ref 0.7–4.0)
MCH: 31.7 pg (ref 26.0–34.0)
MCHC: 33.4 g/dL (ref 30.0–36.0)
MCV: 94.7 fL (ref 78.0–100.0)
MONO ABS: 0.2 10*3/uL (ref 0.1–1.0)
Monocytes Relative: 3 % (ref 3–12)
NEUTROS ABS: 5.1 10*3/uL (ref 1.7–7.7)
Neutrophils Relative %: 83 % — ABNORMAL HIGH (ref 43–77)
Platelets: 160 10*3/uL (ref 150–400)
RBC: 3.41 MIL/uL — ABNORMAL LOW (ref 3.87–5.11)
RDW: 15.2 % (ref 11.5–15.5)
WBC: 6.1 10*3/uL (ref 4.0–10.5)

## 2013-09-17 MED ORDER — OXYCODONE HCL 10 MG PO TABS: ORAL_TABLET | ORAL | Status: DC

## 2013-09-17 NOTE — Progress Notes (Signed)
[facility] presented for labwork. Labs per MD order drawn via Peripheral Line 23 gauge needle inserted in left AC  Good blood return present. Procedure without incident.  Needle removed intact. Patient tolerated procedure well.

## 2013-09-17 NOTE — Patient Instructions (Signed)
[facility] Discharge Instructions  RECOMMENDATIONS MADE BY THE CONSULTANT AND ANY TEST RESULTS WILL BE SENT TO YOUR REFERRING PHYSICIAN.  EXAM FINDINGS BY THE PHYSICIAN TODAY AND SIGNS OR SYMPTOMS TO REPORT TO CLINIC OR PRIMARY PHYSICIAN: Exam and findings as discussed by Dr. Barnet Glasgow.  Stop the lisinopril for now.  Will check some blood work today to see how you are doing.  Report fevers, chills, uncontrolled nausea or vomiting.  MEDICATIONS PRESCRIBED:  none  INSTRUCTIONS/FOLLOW-UP: Follow-up in 3 weeks.  Thank you for choosing [facility] to provide your oncology and hematology care.  To afford each patient quality time with our providers, please arrive at least 15 minutes before your scheduled appointment time.  With your help, our goal is to use those 15 minutes to complete the necessary work-up to ensure our physicians have the information they need to help with your evaluation and healthcare recommendations.    Effective January 1st, 2014, we ask that you re-schedule your appointment with our physicians should you arrive 10 or more minutes late for your appointment.  We strive to give you quality time with our providers, and arriving late affects you and other patients whose appointments are after yours.    Again, thank you for choosing [facility].  Our hope is that these requests will decrease the amount of time that you wait before being seen by our physicians.       _____________________________________________________________  Should you have questions after your visit to [facility], please contact our office at [phone] between the hours of 8:30 a.m. and 5:00 p.m.  Voicemails left after 4:30 p.m. will not be returned until the following business day.  For prescription refill requests, have your pharmacy contact our office with your prescription refill request.

## 2013-09-17 NOTE — Progress Notes (Signed)
[facility]  OFFICE PROGRESS NOTE  [name], MD [place] [address]  DIAGNOSIS: Peritoneal carcinomatosis - Plan: CBC with Differential, CBC with Differential  Cancer of unknown origin - Plan: Oxycodone HCl 10 MG TABS  No chief complaint on file.   CURRENT THERAPY: Doxil 40 mg per meter squared begun one week ago on 09/10/2013.   INTERVAL HISTORY: Erika Moreno 68 y.o. female returns for followup after initiation of salvage therapy with Doxil one week ago with plans to deliver treatment monthly.  She has been dizzy when  assuming an upright position since a week ago. She's had minimal mouth tenderness without worsening peripheral paresthesias. She denies any diarrhea, constipation, melena, hematochezia, vaginal bleeding, or hematuria. She tentatively get a prescription for Xanax refills and was shorted 60 tablets by the same pharmacy who apparently gave her less oxycodone and was appropriate a month ago. She denies any fever, night sweats, but has had a cough with Elby Beck cost of the probably from posterior nasal drip. She denies any earache, abdominal distention I nausea, or vomiting.  MEDICAL HISTORY: Past Medical History  Diagnosis Date  . 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 Cervical cancer 1984  . Myocardial infarction 20 yrs ago  . Coronary artery disease   . Anxiety   . Chronic back pain   . Depression   . GERD (gastroesophageal reflux disease)   . Hypertension   . Hypothyroidism   . Anemia   . Vitamin B 12 deficiency   . Vitamin D deficiency disease     INTERIM HISTORY: has Cancer of unknown origin; Anemia due to chemotherapy; Normocytic anemia; Antineoplastic chemotherapy induced pancytopenia; Dyspnea on exertion; and Neuropathy due to chemotherapeutic drug on her problem list.   Cancer of unknown origin    11/15/2012 - 02/21/2013  Chemotherapy  Carboplatin/Paclitaxel    11/15/2012  Treatment Plan Change  Transfer of care to [facility] from [facility]    03/22/2013  Remission  PET scan- CR    04/16/2013 - 08/20/2013  Chemotherapy  Avastin maintenance every 21 days    08/29/2013  Progression  PET- progression of disease    09/05/2013  Imaging  MUGA demonstrates EF of 63%    09/10/2013 -  Chemotherapy  Doxil every 28 days      ALLERGIES:  is allergic to avastin; morphine and related; reglan; and sulfa antibiotics.  MEDICATIONS: has a current medication list which includes the following prescription(s): alprazolam, amoxicillin-clavulanate, aspirin, bisacodyl, clopidogrel, cyclobenzaprine, dexlansoprazole, duloxetine, famotidine, furosemide, hydroxyzine, isosorbide mononitrate, lidocaine-prilocaine, loratadine, nitroglycerin, ondansetron, oxycodone hcl, oxymetazoline, potassium chloride sa, prednisone, probiotic product, sodium chloride, triamcinolone cream, venlafaxine, and doxorubicin hcl liposomal.  SURGICAL HISTORY:  Past Surgical History  Procedure Laterality Date  . Heart bypass      triple bypass with stents  . Coronary artery bypass graft      3 vessels  . Cardiac catheterization      stents x3  . Abdominal hysterectomy    . Portacath placement Right 12/06/2012    Procedure: INSERTION PORT-A-CATH;  Surgeon: Donato Heinz, MD;  Location: [facility];  Service: General;  Laterality: Right;    FAMILY HISTORY: family history includes Heart attack in her father and mother.  SOCIAL HISTORY:  reports that she quit smoking about 24 years ago. Her smoking use included Cigarettes. She has a 44 pack-year  smoking history. She has never used smokeless tobacco. She reports that she does not drink alcohol or use illicit drugs.  REVIEW OF SYSTEMS:  Other than that discussed above is noncontributory.  PHYSICAL EXAMINATION: ECOG PERFORMANCE STATUS: 1 - Symptomatic but completely ambulatory  Blood pressure 88/46, pulse 92, temperature 97.9 F  (36.6 C), temperature source Oral, resp. rate 16, weight 141 lb 6.4 oz (64.139 kg).  GENERAL:alert, no distress and comfortable SKIN: skin color, texture, turgor are normal, no rashes or significant lesions EYES: PERLA; Conjunctiva are pink and non-injected, sclera clear SINUSES: No redness or tenderness over maxillary or ethmoid sinuses OROPHARYNX:no exudate, no erythema on lips, buccal mucosa, or tongue. NECK: supple, thyroid normal size, non-tender, without nodularity. No masses CHEST: LifePort in place. No breast masses. LYMPH:  no palpable lymphadenopathy in the cervical, axillary or inguinal LUNGS: clear to auscultation and percussion with normal breathing effort HEART: regular rate & rhythm and no murmurs. ABDOMEN:abdomen soft, non-tender and normal bowel sounds. Normoactive bowel sounds with no rebound. MUSCULOSKELETAL:no cyanosis of digits and no clubbing. Range of motion normal.  NEURO: alert & oriented x 3 with fluent speech, no focal motor/sensory deficits   LABORATORY DATA: Office Visit on 09/17/2013  Component Date Value Ref Range Status  . WBC 09/17/2013 6.1  4.0 - 10.5 K/uL Final  . RBC 09/17/2013 3.41* 3.87 - 5.11 MIL/uL Final  . Hemoglobin 09/17/2013 10.8* 12.0 - 15.0 g/dL Final  . HCT 09/17/2013 32.3* 36.0 - 46.0 % Final  . MCV 09/17/2013 94.7  78.0 - 100.0 fL Final  . MCH 09/17/2013 31.7  26.0 - 34.0 pg Final  . MCHC 09/17/2013 33.4  30.0 - 36.0 g/dL Final  . RDW 09/17/2013 15.2  11.5 - 15.5 % Final  . Platelets 09/17/2013 160  150 - 400 K/uL Final  . Neutrophils Relative % 09/17/2013 83* 43 - 77 % Final  . Neutro Abs 09/17/2013 5.1  1.7 - 7.7 K/uL Final  . Lymphocytes Relative 09/17/2013 13  12 - 46 % Final  . Lymphs Abs 09/17/2013 0.8  0.7 - 4.0 K/uL Final  . Monocytes Relative 09/17/2013 3  3 - 12 % Final  . Monocytes Absolute 09/17/2013 0.2  0.1 - 1.0 K/uL Final  . Eosinophils Relative 09/17/2013 1  0 - 5 % Final  . Eosinophils Absolute 09/17/2013 0.1  0.0  - 0.7 K/uL Final  . Basophils Relative 09/17/2013 0  0 - 1 % Final  . Basophils Absolute 09/17/2013 0.0  0.0 - 0.1 K/uL Final  Infusion on 09/10/2013  Component Date Value Ref Range Status  . CA 125 09/10/2013 118.3* 0.0 - 30.2 U/mL Final   Performed at [facility]  . WBC 09/10/2013 4.1  4.0 - 10.5 K/uL Final  . RBC 09/10/2013 3.27* 3.87 - 5.11 MIL/uL Final  . Hemoglobin 09/10/2013 10.3* 12.0 - 15.0 g/dL Final  . HCT 09/10/2013 31.0* 36.0 - 46.0 % Final  . MCV 09/10/2013 94.8  78.0 - 100.0 fL Final  . MCH 09/10/2013 31.5  26.0 - 34.0 pg Final  . MCHC 09/10/2013 33.2  30.0 - 36.0 g/dL Final  . RDW 09/10/2013 14.7  11.5 - 15.5 % Final  . Platelets 09/10/2013 142* 150 - 400 K/uL Final  . Neutrophils Relative % 09/10/2013 67  43 - 77 % Final  . Neutro Abs 09/10/2013 2.7  1.7 - 7.7 K/uL Final  . Lymphocytes Relative 09/10/2013 26  12 - 46 % Final  . Lymphs Abs 09/10/2013 1.1  0.7 - 4.0 K/uL Final  . Monocytes Relative 09/10/2013 6  3 - 12 % Final  . Monocytes Absolute 09/10/2013 0.3  0.1 - 1.0 K/uL Final  . Eosinophils Relative 09/10/2013 1  0 - 5 % Final  . Eosinophils Absolute 09/10/2013 0.1  0.0 - 0.7 K/uL Final  . Basophils Relative 09/10/2013 0  0 - 1 % Final  . Basophils Absolute 09/10/2013 0.0  0.0 - 0.1 K/uL Final  . Sodium 09/10/2013 139  137 - 147 mEq/L Final  . Potassium 09/10/2013 4.5  3.7 - 5.3 mEq/L Final  . Chloride 09/10/2013 98  96 - 112 mEq/L Final  . CO2 09/10/2013 28  19 - 32 mEq/L Final  . Glucose, Bld 09/10/2013 141* 70 - 99 mg/dL Final  . BUN 09/10/2013 39* 6 - 23 mg/dL Final  . Creatinine, Ser 09/10/2013 1.18* 0.50 - 1.10 mg/dL Final  . Calcium 09/10/2013 9.7  8.4 - 10.5 mg/dL Final  . Total Protein 09/10/2013 7.2  6.0 - 8.3 g/dL Final  . Albumin 09/10/2013 3.8  3.5 - 5.2 g/dL Final  . AST 09/10/2013 14  0 - 37 U/L Final  . ALT 09/10/2013 10  0 - 35 U/L Final  . Alkaline Phosphatase 09/10/2013 63  39 - 117 U/L Final  . Total Bilirubin 09/10/2013 0.2*  0.3 - 1.2 mg/dL Final  . GFR calc non Af Amer 09/10/2013 46* >90 mL/min Final  . GFR calc Af Amer 09/10/2013 54* >90 mL/min Final   Comment: (NOTE)                          The eGFR has been calculated using the CKD EPI equation.                          This calculation has not been validated in all clinical situations.                          eGFR's persistently <90 mL/min signify possible Chronic Kidney                          Disease.  Hospital Outpatient Visit on 08/29/2013  Component Date Value Ref Range Status  . Glucose-Capillary 08/29/2013 89  70 - 99 mg/dL Final  Infusion on 08/20/2013  Component Date Value Ref Range Status  . Specific Gravity, Urine 08/20/2013 >1.030* 1.005 - 1.030 Final  . pH 08/20/2013 5.5  5.0 - 8.0 Final  . Glucose, UA 08/20/2013 NEGATIVE  NEGATIVE mg/dL Final  . Hgb urine dipstick 08/20/2013 NEGATIVE  NEGATIVE Final  . Bilirubin Urine 08/20/2013 NEGATIVE  NEGATIVE Final  . Ketones, ur 08/20/2013 NEGATIVE  NEGATIVE mg/dL Final  . Protein, ur 08/20/2013 NEGATIVE  NEGATIVE mg/dL Final  . Urobilinogen, UA 08/20/2013 0.2  0.0 - 1.0 mg/dL Final  . Nitrite 08/20/2013 NEGATIVE  NEGATIVE Final  . Leukocytes, UA 08/20/2013 NEGATIVE  NEGATIVE Final  . WBC 08/20/2013 6.5  4.0 - 10.5 K/uL Final  . RBC 08/20/2013 3.03* 3.87 - 5.11 MIL/uL Final  . Hemoglobin 08/20/2013 9.6* 12.0 - 15.0 g/dL Final  . HCT 08/20/2013 29.1* 36.0 - 46.0 % Final  . MCV 08/20/2013 96.0  78.0 - 100.0 fL Final  . MCH 08/20/2013 31.7  26.0 - 34.0 pg Final  . MCHC 08/20/2013 33.0  30.0 - 36.0 g/dL Final  . RDW  08/20/2013 15.1  11.5 - 15.5 % Final  . Platelets 08/20/2013 232  150 - 400 K/uL Final  . Neutrophils Relative % 08/20/2013 69  43 - 77 % Final  . Neutro Abs 08/20/2013 4.4  1.7 - 7.7 K/uL Final  . Lymphocytes Relative 08/20/2013 24  12 - 46 % Final  . Lymphs Abs 08/20/2013 1.6  0.7 - 4.0 K/uL Final  . Monocytes Relative 08/20/2013 6  3 - 12 % Final  . Monocytes Absolute 08/20/2013  0.4  0.1 - 1.0 K/uL Final  . Eosinophils Relative 08/20/2013 1  0 - 5 % Final  . Eosinophils Absolute 08/20/2013 0.1  0.0 - 0.7 K/uL Final  . Basophils Relative 08/20/2013 0  0 - 1 % Final  . Basophils Absolute 08/20/2013 0.0  0.0 - 0.1 K/uL Final  . Sodium 08/20/2013 137  137 - 147 mEq/L Final  . Potassium 08/20/2013 4.9  3.7 - 5.3 mEq/L Final  . Chloride 08/20/2013 100  96 - 112 mEq/L Final  . CO2 08/20/2013 24  19 - 32 mEq/L Final  . Glucose, Bld 08/20/2013 116* 70 - 99 mg/dL Final  . BUN 42/87/6811 36* 6 - 23 mg/dL Final  . Creatinine, Ser 08/20/2013 1.37* 0.50 - 1.10 mg/dL Final  . Calcium 57/26/2035 9.9  8.4 - 10.5 mg/dL Final  . Total Protein 08/20/2013 7.1  6.0 - 8.3 g/dL Final  . Albumin 59/74/1638 3.3* 3.5 - 5.2 g/dL Final  . AST 45/36/4680 15  0 - 37 U/L Final  . ALT 08/20/2013 10  0 - 35 U/L Final  . Alkaline Phosphatase 08/20/2013 70  39 - 117 U/L Final  . Total Bilirubin 08/20/2013 <0.2* 0.3 - 1.2 mg/dL Final  . GFR calc non Af Amer 08/20/2013 39* >90 mL/min Final  . GFR calc Af Amer 08/20/2013 45* >90 mL/min Final   Comment: (NOTE)                          The eGFR has been calculated using the CKD EPI equation.                          This calculation has not been validated in all clinical situations.                          eGFR's persistently <90 mL/min signify possible Chronic Kidney                          Disease.  . CA 125 08/20/2013 100.4* 0.0 - 30.2 U/mL Final   Performed at [facility]    PATHOLOGY: No new pathology.  Urinalysis    Component Value Date/Time   COLORURINE YELLOW 02/10/2013 1650   APPEARANCEUR CLOUDY* 02/10/2013 1650   LABSPEC >1.030* 08/20/2013 1339   PHURINE 5.5 08/20/2013 1339   GLUCOSEU NEGATIVE 08/20/2013 1339   HGBUR NEGATIVE 08/20/2013 1339   BILIRUBINUR NEGATIVE 08/20/2013 1339   KETONESUR NEGATIVE 08/20/2013 1339   PROTEINUR NEGATIVE 08/20/2013 1339   UROBILINOGEN 0.2 08/20/2013 1339   NITRITE NEGATIVE 08/20/2013 1339    LEUKOCYTESUR NEGATIVE 08/20/2013 1339    RADIOGRAPHIC STUDIES: Nm Cardiac Muga Rest  09/05/2013   CLINICAL DATA:  Peritoneal carcinomatosis. Cardiotoxic chemotherapy.  EXAM: NUCLEAR MEDICINE CARDIAC BLOOD POOL IMAGING (MUGA)  TECHNIQUE: Cardiac multi-gated acquisition was performed at rest following intravenous injection of Tc-20m  labeled red blood cells.  RADIOPHARMACEUTICALS:  25.0 mCi MCiTc-65m in-vitro labeled red blood cells.  COMPARISON:  PET scan 03/22/2013  FINDINGS: No left ventricular wall motion abnormality.  Left ventricular ejection fraction equals 63%  IMPRESSION: Left ventricular ejection fraction equals 63%   Electronically Signed   By: Suzy Bouchard M.D.   On: 09/05/2013 09:24   Nm Pet Image Restag (ps) Skull Base To Thigh  08/29/2013   CLINICAL DATA:  Subsequent treatment strategy for ovarian cancer. Breast cancer. Peritoneal carcinomatosis.  EXAM: NUCLEAR MEDICINE PET SKULL BASE TO THIGH  TECHNIQUE: 7.3 mCi F-18 FDG was injected intravenously. Full-ring PET imaging was performed from the skull base to thigh after the radiotracer. CT data was obtained and used for attenuation correction and anatomic localization.  FASTING BLOOD GLUCOSE:  Value: 89 mg/dl  COMPARISON:  Multiple exams, including 06/12/2013 and 03/22/2013  FINDINGS: NECK  Right station 3 lymph node, 0.7 cm in short axis, maximum standard uptake value 10.1. Focally high activity corresponding to a small lymph node in the left supraclavicular adipose tissue, 0.6 cm in short axis, maximum standard uptake value 6.6.  Incidental chronic left maxillary sinusitis with a large mucous retention cyst.  CHEST  Scattered axillary adenopathy with a lateral left axillary node, 0.8 cm in short axis, maximum standard uptake value 10.2. Hypermetabolic right subpectoral node along the subclavian vessels, 0.5 cm in short axis, maximum standard uptake value 4.3.  Hypermetabolic precarinal, subcarinal, and right infrahilar adenopathy. Precarinal  lymph node maximum standard uptake value 13.9, short axis diameter 1.3 cm.  Faint hypermetabolism in a bandlike density in the lingula, maximum standard uptake value 2.9.  Cardiomegaly noted.  ABDOMEN/PELVIS  The gastrohepatic ligament lymph node maximum standard uptake value 4.6, short axis diameter 0.8 cm. Left lower periaortic adenopathy, short axis diameter 0.8 cm, maximum standard uptake value 11.6. Small left common iliac lymph nodes, maximum standard uptake value 6.6, short axis diameter 0.7 cm. Hypermetabolic focus above the cystic left adnexal lesion, just along its superior margin, maximum standard uptake value 8.5. The cystic lesion itself measures 7.3 x 4.0 cm, with higher density posterior component.  Scattered colonic and small bowel activity, likely physiologic.  Small foci of hypermetabolic activity in the transverse colon are somewhat focal and may represent hypermetabolic polyps, but are poorly correlated on the CT data.  Nonobstructive right nephrolithiasis. New mesenteric stranding and scattered small lymph nodes observed in the central mesentery, but without central mesenteric hypermetabolic activity.  SKELETON  No focal hypermetabolic activity to suggest skeletal metastasis.  IMPRESSION: 1. Scattered increasingly hypermetabolic lymph nodes in the neck, chest, abdomen, and pelvis indicative of nodal spread of cancer. No compelling pattern of peritoneal spread of the cancer. 2. New central mesenteric stranding with scattered mesenteric lymph nodes but without abnormal high activity within these lymph nodes. The cause of the mesenteric stranding is uncertain; pancreatitis is not excluded. 3. Chronic left maxillary sinusitis.  Cardiomegaly. 4. Scattered foci of increased activity in the transverse colon could represent polyps, although were not hypermetabolic previously and accordingly are more likely physiologic. 5. Nonobstructive right nephrolithiasis. 6. Complex cystic left adnexal lesion has a  hypermetabolic focus along its upper margin. This is new and I am uncertain whether it represents a new lymph node along the upper margin, or true nodularity within the lesion itself.   Electronically Signed   By: Sherryl Barters M.D.   On: 08/29/2013 09:56    ASSESSMENT:  . Progressive primary peritoneal carcinomatosis with involvement of cervical,  thoracic, and abdominal lymph nodes without any reoccurrence of abdominal masses, good tolerance of initial Doxil treatment.  2. Lower extremity vasculitis with subcutaneous urticaria, improved on prednisone.  3. Peripheral neuropathy, better controlled on Cymbalta 30 mg at bedtime with lower analgesic requirement.  4. Anemia secondary to chemotherapy, improved from 9.3-11.1 after Aranesp was given on 04/16/2013.  5. Pruritus following treatment, it by Atarax, prednisone, and famotidine  6. Symptomatic allergic rhinitis with reactive left posterior cervical lymphadenopathy.  7. Shortness of breath on exertion probably due to anemia and possible fluid overload.  8. Tremors secondary to metoclopramide  9. Discrepancy regarding opioid and benzodiazepine prescription dispensing.  10. Postnasal drip. 11. Orthostatic hypotension.                                        PLAN:  #1. Discontinue lisinopril/HCTZ. #2. Change pharmacies. #3. Followup in 3 weeks with CBC, chem profile, and CA 125. Cycle #2 of Doxil will be administered at that time.   All questions were answered. The patient knows to call the clinic with any problems, questions or concerns. We can certainly see the patient much sooner if necessary.   I spent 25 minutes counseling the patient face to face. The total time spent in the appointment was 30 minutes.    Doroteo Bradford, MD 09/17/2013 10:50 AM  DISCLAIMER:  This note was dictated with voice recognition software.  Similar sounding words can inadvertently be transcribed inaccurately and may not be corrected upon  review.

## 2013-09-24 ENCOUNTER — Other Ambulatory Visit (HOSPITAL_COMMUNITY): Payer: Self-pay | Admitting: Oncology

## 2013-09-24 DIAGNOSIS — L299 Pruritus, unspecified: Secondary | ICD-10-CM

## 2013-09-24 MED ORDER — HYDROXYZINE HCL 10 MG PO TABS: ORAL_TABLET | ORAL | Status: DC

## 2013-09-28 ENCOUNTER — Telehealth (HOSPITAL_COMMUNITY): Payer: Self-pay | Admitting: Oncology

## 2013-09-28 DIAGNOSIS — B029 Zoster without complications: Secondary | ICD-10-CM

## 2013-09-28 MED ORDER — FAMCICLOVIR 500 MG PO TABS: 500.0000 mg | ORAL_TABLET | Freq: Three times a day (TID) | ORAL | Status: DC

## 2013-09-28 NOTE — Telephone Encounter (Signed)
Patient called complaining of right sided "dried moles" that has scabbed over.  Additionally, she has 1 mole on her right thigh, with 1 lesion on her right thigh that was a water blister.  She has popped the water blister lesion and has been using triple antibiotic ointment.  She thinks they are getting better.  From a chemotherapy standpoint, this is not a side effect of Doxil.    I discussed this with Dr. Barnet Glasgow, and he recommended covering her for shingles outbreak as the lumbar dermatomes follow that pattern, she is immunocompromised, and therefore at risk for shingles.  He recommended covering her with Famvir 500 mg TID x 10 days.    I called the patient back with this information and e-scribed her anti-viral medication as described above.  Patient and plan discussed with Dr. Farrel Gobble and he is in agreement with the aforementioned.   KEFALAS,THOMAS 09/28/2013

## 2013-10-05 NOTE — Progress Notes (Addendum)
Erika Hilding, MD [place] [address]  Cancer of unknown origin - Plan: Oxycodone HCl 10 MG TABS  Herpes zoster - Plan: famciclovir (FAMVIR) 500 MG tablet  Insomnia  Abdominal pain, unspecified site  CURRENT THERAPY: Doxil beginning on 09/10/2013  INTERVAL HISTORY: Erika Moreno 68 y.o. 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    Cancer of unknown origin   11/15/2012 - 02/21/2013 Chemotherapy Carboplatin/Paclitaxel   11/15/2012 Treatment Plan Change Transfer of care to [facility] from [place]   03/22/2013 Remission PET scan- CR   04/16/2013 - 08/20/2013 Chemotherapy Avastin maintenance every 21 days   08/29/2013 Progression PET- progression of disease   09/05/2013 Imaging MUGA demonstrates EF of 63%   09/10/2013 -  Chemotherapy Doxil every 28 days   I personally reviewed and went over laboratory results with the patient.  The results are noted within this dictation.  She contacted the clinic the other day with a rash that sounded like herpes zoster.  Since she lives in [place] and was unable to get to the clinic, she was treated empirically with Famvir.  Today, I have seen the rash and it does appear to be Zoster that is healing.  She admits to pain and discomfort in this area.  These lesions are crusted over.  I will refill her Famvir medication.  She notes some difficulty with sleeping.  I recommended OTC Benadryl as she has had difficulty in the past with prescription sleeping medication.  She notes nonspecific abdominal pain and I wonder if that is secondary to her zoster outbreak.  It is intermittent and resolves spontaneously.  Additionally, she notes increased anxiety due to "family issues."  She has taken Xanax in the past and I think it is reasonable to refill this  medication given her difficulty with sleeping due to this anxiety.    She has switched her pharmacy to [facility] in [place] and she notes that they are very expensive.  Therefore, she wishes I print her Rx so she can shop around.   Oncologically, she denies any complaints and ROS questioning is negative.   Past Medical History  Diagnosis Date  . 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 Cervical cancer 1984  . Myocardial infarction 20 yrs ago  . Coronary artery disease   . Anxiety   . Chronic back pain   . Depression   . GERD (gastroesophageal reflux disease)   . Hypertension   . Hypothyroidism   . Anemia   . Vitamin B 12 deficiency   . Vitamin D deficiency disease     has Cancer of unknown origin; Anemia due to chemotherapy; Normocytic anemia; Antineoplastic chemotherapy induced pancytopenia; Dyspnea on exertion; and Neuropathy due to chemotherapeutic drug on her problem list.     is allergic to avastin; morphine and related; reglan; and sulfa antibiotics.  Erika Moreno had no medications administered during this visit.  Past Surgical History  Procedure Laterality Date  . Heart bypass      triple bypass with stents  . Coronary artery bypass graft      3 vessels  . Cardiac catheterization      stents x3  . Abdominal hysterectomy    . Portacath placement Right 12/06/2012    Procedure: INSERTION PORT-A-CATH;  Surgeon: Donato Heinz, MD;  Location: [facility];  Service: General;  Laterality: Right;    Denies any headaches, dizziness, double vision, fevers, chills, night sweats, nausea, vomiting, diarrhea, constipation, chest pain, heart palpitations, shortness of breath, blood in stool, black tarry stool, urinary pain, urinary burning, urinary frequency, hematuria.   PHYSICAL EXAMINATION  ECOG PERFORMANCE STATUS: 1 - Symptomatic but completely ambulatory  There were no vitals filed for this visit.  GENERAL:alert, no distress, well nourished, well developed,  comfortable, cooperative and smiling SKIN: skin color, texture, turgor are normal, positive for: right lateral lumbar zoster rash that is scabbed over. HEAD: Normocephalic, No masses, lesions, tenderness or abnormalities EYES: normal, PERRLA, EOMI, Conjunctiva are pink and non-injected EARS: External ears normal OROPHARYNX:mucous membranes are moist  NECK: supple, trachea midline LYMPH:  no palpable lymphadenopathy BREAST:not examined LUNGS: clear to auscultation  HEART: regular rate & rhythm ABDOMEN:abdomen soft, non-tender and normal bowel sounds BACK: Back symmetric, no curvature. EXTREMITIES:less then 2 second capillary refill, no joint deformities, effusion, or inflammation, no edema, no skin discoloration, no clubbing, no cyanosis  NEURO: alert & oriented x 3 with fluent speech, no focal motor/sensory deficits, gait normal   LABORATORY DATA: CBC    Component Value Date/Time   WBC 4.3 10/08/2013 0910   RBC 3.47* 10/08/2013 0910   HGB 11.0* 10/08/2013 0910   HCT 33.3* 10/08/2013 0910   PLT 233 10/08/2013 0910   MCV 96.0 10/08/2013 0910   MCH 31.7 10/08/2013 0910   MCHC 33.0 10/08/2013 0910   RDW 16.7* 10/08/2013 0910   LYMPHSABS 1.1 10/08/2013 0910   MONOABS 0.4 10/08/2013 0910   EOSABS 0.0 10/08/2013 0910   BASOSABS 0.0 10/08/2013 0910      Chemistry      Component Value Date/Time   NA 139 09/10/2013 0830   K 4.5 09/10/2013 0830   CL 98 09/10/2013 0830   CO2 28 09/10/2013 0830   BUN 39* 09/10/2013 0830   CREATININE 1.18* 09/10/2013 0830      Component Value Date/Time   CALCIUM 9.7 09/10/2013 0830   ALKPHOS 63 09/10/2013 0830   AST 14 09/10/2013 0830   ALT 10 09/10/2013 0830   BILITOT 0.2* 09/10/2013 0830     Lab Results  Component Value Date   CA125 118.3* 09/10/2013     ASSESSMENT:  1. 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  2. Right  T12-L1 dermatome herpes zoster outbreak. 3. Nonspecific abdominal pain, intermittent. 4. Insomnia 5. Anxiety  Patient Active Problem List   Diagnosis Date Noted  . Normocytic anemia 02/10/2013  . Antineoplastic chemotherapy induced pancytopenia 02/10/2013  . Dyspnea on exertion 02/10/2013  . Neuropathy due to chemotherapeutic drug 02/10/2013  . Anemia due to chemotherapy 01/24/2013  . Cancer of unknown origin 11/14/2012     PLAN:  1. I personally reviewed and went over laboratory results with the patient.  The results are noted within this dictation. 2. Pre-chemo labs 3. Doxil today for cycle 2 4. Refill on Famvir 5. Refill on Oxycodone 6. Recommend 1-2 tablets of Benadryl at HS for insomnia 7. Patient education regarding herpes zoster. 8. Refill on Xanax 9. Return as scheduled in 4 weeks for follow-up.   THERAPY PLAN:  We will plan on providing her 3 cycles of Doxil in a salvage fashion unless there is frank progression.  Following 3 cycles, we will re-evaluate and consider further  therapy.   All questions were answered. The patient knows to call the clinic with any problems, questions or concerns. We can certainly see the patient much sooner if necessary.  Patient and plan discussed with Dr. Farrel Gobble and he is in agreement with the aforementioned.   Hartleigh Edmonston 10/08/2013

## 2013-10-08 ENCOUNTER — Encounter (HOSPITAL_BASED_OUTPATIENT_CLINIC_OR_DEPARTMENT_OTHER): Payer: PRIVATE HEALTH INSURANCE | Admitting: Oncology

## 2013-10-08 ENCOUNTER — Encounter (HOSPITAL_COMMUNITY): Payer: PRIVATE HEALTH INSURANCE | Attending: Internal Medicine

## 2013-10-08 VITALS — BP 159/91 | HR 78 | Temp 98.5°F | Resp 16 | Wt 138.6 lb

## 2013-10-08 DIAGNOSIS — Z5111 Encounter for antineoplastic chemotherapy: Secondary | ICD-10-CM

## 2013-10-08 DIAGNOSIS — C801 Malignant (primary) neoplasm, unspecified: Secondary | ICD-10-CM | POA: Insufficient documentation

## 2013-10-08 DIAGNOSIS — C778 Secondary and unspecified malignant neoplasm of lymph nodes of multiple regions: Secondary | ICD-10-CM

## 2013-10-08 DIAGNOSIS — F419 Anxiety disorder, unspecified: Secondary | ICD-10-CM

## 2013-10-08 DIAGNOSIS — B029 Zoster without complications: Secondary | ICD-10-CM | POA: Insufficient documentation

## 2013-10-08 DIAGNOSIS — C482 Malignant neoplasm of peritoneum, unspecified: Secondary | ICD-10-CM

## 2013-10-08 DIAGNOSIS — G4700 Insomnia, unspecified: Secondary | ICD-10-CM

## 2013-10-08 DIAGNOSIS — R109 Unspecified abdominal pain: Secondary | ICD-10-CM

## 2013-10-08 DIAGNOSIS — F411 Generalized anxiety disorder: Secondary | ICD-10-CM

## 2013-10-08 DIAGNOSIS — T451X5A Adverse effect of antineoplastic and immunosuppressive drugs, initial encounter: Secondary | ICD-10-CM | POA: Insufficient documentation

## 2013-10-08 LAB — COMPREHENSIVE METABOLIC PANEL
ALBUMIN: 3.7 g/dL (ref 3.5–5.2)
ALT: 13 U/L (ref 0–35)
ANION GAP: 12 (ref 5–15)
AST: 17 U/L (ref 0–37)
Alkaline Phosphatase: 58 U/L (ref 39–117)
BUN: 28 mg/dL — AB (ref 6–23)
CALCIUM: 9.8 mg/dL (ref 8.4–10.5)
CO2: 27 mEq/L (ref 19–32)
CREATININE: 1 mg/dL (ref 0.50–1.10)
Chloride: 101 mEq/L (ref 96–112)
GFR calc Af Amer: 66 mL/min — ABNORMAL LOW (ref >90)
GFR calc non Af Amer: 57 mL/min — ABNORMAL LOW (ref >90)
Glucose, Bld: 90 mg/dL (ref 70–99)
Potassium: 4.4 mEq/L (ref 3.7–5.3)
Sodium: 140 mEq/L (ref 137–147)
TOTAL PROTEIN: 7.2 g/dL (ref 6.0–8.3)
Total Bilirubin: 0.2 mg/dL — ABNORMAL LOW (ref 0.3–1.2)

## 2013-10-08 LAB — CBC WITH DIFFERENTIAL/PLATELET
BASOS PCT: 1 % (ref 0–1)
Basophils Absolute: 0 10*3/uL (ref 0.0–0.1)
EOS PCT: 0 % (ref 0–5)
Eosinophils Absolute: 0 10*3/uL (ref 0.0–0.7)
HEMATOCRIT: 33.3 % — AB (ref 36.0–46.0)
HEMOGLOBIN: 11 g/dL — AB (ref 12.0–15.0)
Lymphocytes Relative: 25 % (ref 12–46)
Lymphs Abs: 1.1 10*3/uL (ref 0.7–4.0)
MCH: 31.7 pg (ref 26.0–34.0)
MCHC: 33 g/dL (ref 30.0–36.0)
MCV: 96 fL (ref 78.0–100.0)
MONO ABS: 0.4 10*3/uL (ref 0.1–1.0)
MONOS PCT: 9 % (ref 3–12)
Neutro Abs: 2.8 10*3/uL (ref 1.7–7.7)
Neutrophils Relative %: 65 % (ref 43–77)
Platelets: 233 10*3/uL (ref 150–400)
RBC: 3.47 MIL/uL — ABNORMAL LOW (ref 3.87–5.11)
RDW: 16.7 % — ABNORMAL HIGH (ref 11.5–15.5)
WBC: 4.3 10*3/uL (ref 4.0–10.5)

## 2013-10-08 LAB — CA 125: CA 125: 121.2 U/mL — AB (ref 0.0–30.2)

## 2013-10-08 MED ORDER — HEPARIN SOD (PORK) LOCK FLUSH 100 UNIT/ML IV SOLN
500.0000 [IU] | Freq: Once | INTRAVENOUS | Status: AC | PRN
Start: 2013-10-08 — End: 2013-10-08
  Administered 2013-10-08: 500 [IU]

## 2013-10-08 MED ORDER — SODIUM CHLORIDE 0.9 % IV SOLN
Freq: Once | INTRAVENOUS | Status: AC
  Administered 2013-10-08: 8 mg via INTRAVENOUS
  Filled 2013-10-08: qty 4

## 2013-10-08 MED ORDER — FAMCICLOVIR 500 MG PO TABS: 500.0000 mg | ORAL_TABLET | Freq: Three times a day (TID) | ORAL | Status: DC

## 2013-10-08 MED ORDER — DOXORUBICIN HCL LIPOSOMAL CHEMO INJECTION 2 MG/ML
40.0000 mg/m2 | Freq: Once | INTRAVENOUS | Status: AC
  Administered 2013-10-08: 70 mg via INTRAVENOUS
  Filled 2013-10-08: qty 35

## 2013-10-08 MED ORDER — ALPRAZOLAM 0.25 MG PO TABS: 0.2500 mg | ORAL_TABLET | Freq: Four times a day (QID) | ORAL | Status: DC | PRN

## 2013-10-08 MED ORDER — SODIUM CHLORIDE 0.9 % IV SOLN
Freq: Once | INTRAVENOUS | Status: AC
  Administered 2013-10-08: 10:00:00 via INTRAVENOUS

## 2013-10-08 MED ORDER — SODIUM CHLORIDE 0.9 % IJ SOLN
10.0000 mL | INTRAMUSCULAR | Status: DC | PRN
  Administered 2013-10-08: 10 mL

## 2013-10-08 MED ORDER — OXYCODONE HCL 10 MG PO TABS: ORAL_TABLET | ORAL | Status: DC

## 2013-10-08 NOTE — Patient Instructions (Signed)
[facility] Discharge Instructions  RECOMMENDATIONS MADE BY THE CONSULTANT AND ANY TEST RESULTS WILL BE SENT TO YOUR REFERRING PHYSICIAN.  EXAM FINDINGS BY THE PHYSICIAN TODAY AND SIGNS OR SYMPTOMS TO REPORT TO CLINIC OR PRIMARY PHYSICIAN: You saw Erika Moreno today  MEDICATIONS PRESCRIBED:  Refilling Famvir 500mg  for shingles outbreak.  Refilling pain medication today.  And when you are having trouble sleeping take 1-2 benadyl before bedtime  INSTRUCTIONS GIVEN AND DISCUSSED: Chemo cycle II today.  Follow doctors appt with cycle III   Thank you for choosing [facility] to provide your oncology and hematology care.  To afford each patient quality time with our providers, please arrive at least 15 minutes before your scheduled appointment time.  With your help, our goal is to use those 15 minutes to complete the necessary work-up to ensure our physicians have the information they need to help with your evaluation and healthcare recommendations.    Effective January 1st, 2014, we ask that you re-schedule your appointment with our physicians should you arrive 10 or more minutes late for your appointment.  We strive to give you quality time with our providers, and arriving late affects you and other patients whose appointments are after yours.    Again, thank you for choosing [facility].  Our hope is that these requests will decrease the amount of time that you wait before being seen by our physicians.       _____________________________________________________________  Should you have questions after your visit to [facility], please contact our office at [phone] between the hours of 8:30 a.m. and 5:00 p.m.  Voicemails left after 4:30 p.m. will not be returned until the following business day.  For prescription refill requests, have your pharmacy contact our office with your prescription refill request.

## 2013-10-08 NOTE — Addendum Note (Signed)
Addended by: Baird Cancer on: 10/08/2013 10:17 AM   Modules accepted: Level of Service

## 2013-10-08 NOTE — Progress Notes (Signed)
Tolerated well

## 2013-11-05 ENCOUNTER — Encounter (HOSPITAL_COMMUNITY): Payer: PRIVATE HEALTH INSURANCE | Attending: Internal Medicine

## 2013-11-05 ENCOUNTER — Encounter (HOSPITAL_COMMUNITY): Payer: Self-pay

## 2013-11-05 ENCOUNTER — Encounter (HOSPITAL_BASED_OUTPATIENT_CLINIC_OR_DEPARTMENT_OTHER): Payer: PRIVATE HEALTH INSURANCE

## 2013-11-05 VITALS — BP 171/85 | HR 61 | Temp 98.1°F | Resp 16 | Wt 140.3 lb

## 2013-11-05 DIAGNOSIS — C772 Secondary and unspecified malignant neoplasm of intra-abdominal lymph nodes: Secondary | ICD-10-CM

## 2013-11-05 DIAGNOSIS — B029 Zoster without complications: Secondary | ICD-10-CM

## 2013-11-05 DIAGNOSIS — T451X5A Adverse effect of antineoplastic and immunosuppressive drugs, initial encounter: Secondary | ICD-10-CM | POA: Diagnosis not present

## 2013-11-05 DIAGNOSIS — C482 Malignant neoplasm of peritoneum, unspecified: Secondary | ICD-10-CM

## 2013-11-05 DIAGNOSIS — C801 Malignant (primary) neoplasm, unspecified: Secondary | ICD-10-CM

## 2013-11-05 DIAGNOSIS — C771 Secondary and unspecified malignant neoplasm of intrathoracic lymph nodes: Secondary | ICD-10-CM

## 2013-11-05 DIAGNOSIS — Z5111 Encounter for antineoplastic chemotherapy: Secondary | ICD-10-CM

## 2013-11-05 DIAGNOSIS — C786 Secondary malignant neoplasm of retroperitoneum and peritoneum: Secondary | ICD-10-CM

## 2013-11-05 DIAGNOSIS — C770 Secondary and unspecified malignant neoplasm of lymph nodes of head, face and neck: Secondary | ICD-10-CM

## 2013-11-05 LAB — CBC WITH DIFFERENTIAL/PLATELET
BASOS PCT: 0 % (ref 0–1)
Basophils Absolute: 0 10*3/uL (ref 0.0–0.1)
Eosinophils Absolute: 0 10*3/uL (ref 0.0–0.7)
Eosinophils Relative: 0 % (ref 0–5)
HCT: 31.9 % — ABNORMAL LOW (ref 36.0–46.0)
HEMOGLOBIN: 10.4 g/dL — AB (ref 12.0–15.0)
Lymphocytes Relative: 18 % (ref 12–46)
Lymphs Abs: 0.9 10*3/uL (ref 0.7–4.0)
MCH: 31.8 pg (ref 26.0–34.0)
MCHC: 32.6 g/dL (ref 30.0–36.0)
MCV: 97.6 fL (ref 78.0–100.0)
MONOS PCT: 11 % (ref 3–12)
Monocytes Absolute: 0.6 10*3/uL (ref 0.1–1.0)
NEUTROS ABS: 3.6 10*3/uL (ref 1.7–7.7)
NEUTROS PCT: 71 % (ref 43–77)
Platelets: 206 10*3/uL (ref 150–400)
RBC: 3.27 MIL/uL — AB (ref 3.87–5.11)
RDW: 18.1 % — ABNORMAL HIGH (ref 11.5–15.5)
WBC: 5.2 10*3/uL (ref 4.0–10.5)

## 2013-11-05 LAB — COMPREHENSIVE METABOLIC PANEL
ALBUMIN: 3.6 g/dL (ref 3.5–5.2)
ALK PHOS: 61 U/L (ref 39–117)
ALT: 17 U/L (ref 0–35)
AST: 22 U/L (ref 0–37)
Anion gap: 10 (ref 5–15)
BUN: 19 mg/dL (ref 6–23)
CHLORIDE: 98 meq/L (ref 96–112)
CO2: 31 mEq/L (ref 19–32)
Calcium: 9.7 mg/dL (ref 8.4–10.5)
Creatinine, Ser: 0.83 mg/dL (ref 0.50–1.10)
GFR calc Af Amer: 82 mL/min — ABNORMAL LOW (ref >90)
GFR calc non Af Amer: 71 mL/min — ABNORMAL LOW (ref >90)
Glucose, Bld: 74 mg/dL (ref 70–99)
POTASSIUM: 4.4 meq/L (ref 3.7–5.3)
Sodium: 139 mEq/L (ref 137–147)
Total Bilirubin: 0.2 mg/dL — ABNORMAL LOW (ref 0.3–1.2)
Total Protein: 7.3 g/dL (ref 6.0–8.3)

## 2013-11-05 LAB — CA 125: CA 125: 103.7 U/mL — ABNORMAL HIGH (ref 0.0–30.2)

## 2013-11-05 MED ORDER — OXYCODONE HCL 10 MG PO TABS: ORAL_TABLET | ORAL | Status: DC

## 2013-11-05 MED ORDER — DOXORUBICIN HCL LIPOSOMAL CHEMO INJECTION 2 MG/ML
40.0000 mg/m2 | Freq: Once | INTRAVENOUS | Status: AC
  Administered 2013-11-05: 70 mg via INTRAVENOUS
  Filled 2013-11-05: qty 35

## 2013-11-05 MED ORDER — SODIUM CHLORIDE 0.9 % IV SOLN
Freq: Once | INTRAVENOUS | Status: AC
  Administered 2013-11-05: 8 mg via INTRAVENOUS
  Filled 2013-11-05: qty 4

## 2013-11-05 MED ORDER — SODIUM CHLORIDE 0.9 % IV SOLN
Freq: Once | INTRAVENOUS | Status: AC
  Administered 2013-11-05: 09:00:00 via INTRAVENOUS

## 2013-11-05 MED ORDER — SODIUM CHLORIDE 0.9 % IJ SOLN
10.0000 mL | INTRAMUSCULAR | Status: DC | PRN
  Administered 2013-11-05: 10 mL

## 2013-11-05 MED ORDER — HEPARIN SOD (PORK) LOCK FLUSH 100 UNIT/ML IV SOLN
500.0000 [IU] | Freq: Once | INTRAVENOUS | Status: AC | PRN
  Administered 2013-11-05: 500 [IU]
  Filled 2013-11-05: qty 5

## 2013-11-05 NOTE — Patient Instructions (Addendum)
Erika Moreno Discharge Instructions  RECOMMENDATIONS MADE BY THE CONSULTANT AND ANY TEST RESULTS WILL BE SENT TO YOUR REFERRING PHYSICIAN.  EXAM FINDINGS BY THE PHYSICIAN TODAY AND SIGNS OR SYMPTOMS TO REPORT TO CLINIC OR PRIMARY PHYSICIAN: Exam and findings as discussed by Dr.Formanek.  MEDICATIONS PRESCRIBED:  Continue all as prescribed. A [name] RX was given to you today for Oxycodone. Take Dulcolax 2 or more tablets daily to keep bowels moving.  INSTRUCTIONS/FOLLOW-UP: Continue current chemotherapy as scheduled. CT scan in 2 weeks. Return to clinic in 3 weeks for MD follow up, review scans. Report any issues/concerns to clinic as needed prior to appointments.  Thank you for choosing [facility] to provide your oncology and hematology care.  To afford each patient quality time with our providers, please arrive at least 15 minutes before your scheduled appointment time.  With your help, our goal is to use those 15 minutes to complete the necessary work-up to ensure our physicians have the information they need to help with your evaluation and healthcare recommendations.    Effective January 1st, 2014, we ask that you re-schedule your appointment with our physicians should you arrive 10 or more minutes late for your appointment.  We strive to give you quality time with our providers, and arriving late affects you and other patients whose appointments are after yours.    Again, thank you for choosing [facility].  Our hope is that these requests will decrease the amount of time that you wait before being seen by our physicians.       _____________________________________________________________  Should you have questions after your visit to [facility], please contact our office at [phone] between the hours of 8:30 a.m. and 4:30 p.m.  Voicemails left after 4:30 p.m. will not be returned until the following business day.  For  prescription refill requests, have your pharmacy contact our office with your prescription refill request.    _______________________________________________________________  We hope that we have given you very good care.  You may receive a patient satisfaction survey in the mail, please complete it and return it as soon as possible.  We value your feedback!  _______________________________________________________________  Have you asked about our STAR program?  STAR stands for Survivorship Training and Rehabilitation, and this is a nationally recognized cancer care program that focuses on survivorship and rehabilitation.  Cancer and cancer treatments may cause problems, such as, pain, making you feel tired and keeping you from doing the things that you need or want to do. Cancer rehabilitation can help. Our goal is to reduce these troubling effects and help you have the best quality of life possible.  You may receive a survey from a nurse that asks questions about your current state of health.  Based on the survey results, all eligible patients will be referred to the [facility] program for an evaluation so we can better serve you!  A frequently asked questions sheet is available upon request.

## 2013-11-05 NOTE — Progress Notes (Signed)
[facility]  OFFICE PROGRESS NOTE  [name], MD [address] [place] [address]  DIAGNOSIS: Peritoneal carcinomatosis - Plan: CT Abdomen Pelvis W Contrast, CT Chest W Contrast  Shingles outbreak  Cancer of unknown origin - Plan: Oxycodone HCl 10 MG TABS  Chief Complaint  Patient presents with  . Peritoneal carcinomatosis    CURRENT THERAPY: Doxil monthly beginning 09/10/2013  INTERVAL HISTORY: Erika Moreno 68 y.o. female returns for followup and continuation of therapy for stage IV ovarian carcinoma presenting with peritoneal carcinomatosis with cervical, thoracic, and abdominal lymph node involvement currently receiving Doxil after documentation of progressive disease in June of 2015.  She has developed changes in the nailbeds both in the toes and fingertips. Previously noted area of zoster infection has improved but with residual pain. She denies any sore throat but does have nasal drip with bilateral cervical lymphadenopathy which appears to be worsening. She denies abdominal distention and is chronically constipated. She is taking about 40 mg of oxycodone per day. She denies any nausea, vomiting, lower extremity swelling or redness, cough, wheezing, diarrhea, melena, hematochezia, hematuria, vaginal bleeding, headache, or seizures.  MEDICAL HISTORY: Past Medical History  Diagnosis Date  . 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 Cervical cancer 1984  . Myocardial infarction 20 yrs ago  . Coronary artery disease   . Anxiety   . Chronic back pain   . Depression   . GERD (gastroesophageal reflux disease)   . Hypertension   . Hypothyroidism   . Anemia   . Vitamin B 12 deficiency   . Vitamin D deficiency disease     INTERIM HISTORY: has Cancer of unknown origin; Anemia due to chemotherapy; Normocytic anemia; Antineoplastic chemotherapy induced pancytopenia; Dyspnea on exertion; and Neuropathy  due to chemotherapeutic drug on her problem list.   Cancer of unknown origin  11/15/2012 - 02/21/2013  Chemotherapy  Carboplatin/Paclitaxel  11/15/2012  Treatment Plan Change  Transfer of care to [facility] from [facility]  03/22/2013  Remission  PET scan- CR  04/16/2013 - 08/20/2013  Chemotherapy  Avastin maintenance every 21 days  08/29/2013  Progression  PET- progression of disease  09/05/2013  Imaging  MUGA demonstrates EF of 63%  09/10/2013 -  Chemotherapy  Doxil every 28 days   ALLERGIES:  is allergic to avastin; morphine and related; reglan; and sulfa antibiotics.  MEDICATIONS: has a current medication list which includes the following prescription(s): alprazolam, aspirin, bisacodyl, clopidogrel, cyclobenzaprine, dexlansoprazole, doxorubicin hcl liposomal, duloxetine, famotidine, furosemide, hydroxyzine, isosorbide mononitrate, lidocaine-prilocaine, loratadine, ondansetron, oxycodone hcl, oxymetazoline, potassium chloride sa, prednisone, probiotic product, sodium chloride, triamcinolone cream, venlafaxine, and nitroglycerin, and the following Facility-Administered Medications: DOXOrubicin HCL LIPOSOMAL (DOXIL) 70 mg in dextrose 5 % 250 mL chemo infusion, heparin lock flush, and sodium chloride.  SURGICAL HISTORY:  Past Surgical History  Procedure Laterality Date  . Heart bypass      triple bypass with stents  . Coronary artery bypass graft      3 vessels  . Cardiac catheterization      stents x3  . Abdominal hysterectomy    . Portacath placement Right 12/06/2012    Procedure: INSERTION PORT-A-CATH;  Surgeon: Donato Heinz, MD;  Location: [facility];  Service: General;  Laterality: Right;    FAMILY HISTORY: family history includes Heart attack in her father and mother.  SOCIAL HISTORY:  reports that  she quit smoking about 24 years ago. Her smoking use included Cigarettes. She has a 44 pack-year smoking history. She has never used smokeless  tobacco. She reports that she does not drink alcohol or use illicit drugs.  REVIEW OF SYSTEMS:  Other than that discussed above is noncontributory.  PHYSICAL EXAMINATION: ECOG PERFORMANCE STATUS: 1 - Symptomatic but completely ambulatory  Blood pressure 171/85, pulse 61, temperature 98.1 F (36.7 C), temperature source Oral, resp. rate 16, weight 140 lb 4.8 oz (63.64 kg).  GENERAL:alert, no distress and comfortable SKIN: skin color, texture, turgor are normal, no rashes or significant lesions EYES: PERLA; Conjunctiva are pink and non-injected, sclera clear SINUSES: No redness or tenderness over maxillary or ethmoid sinuses. No sinus tenderness. OROPHARYNX:no exudate, no erythema on lips, buccal mucosa, or tongue. NECK: supple, thyroid normal size, non-tender, without nodularity. No masses CHEST: LifePort in place with no breast masses. LYMPH: Bilateral cervical lymph nodes measuring about 2 cm in size LUNGS: clear to auscultation and percussion with normal breathing effort HEART: regular rate & rhythm and no murmurs. ABDOMEN:abdomen soft, non-tender and normal bowel sounds MUSCULOSKELETAL:no cyanosis of digits and no clubbing. Range of motion normal. Nailbeds loose with no hand-foot syndrome.  NEURO: alert & oriented x 3 with fluent speech, no focal motor/sensory deficits   LABORATORY DATA:  Results for Erika Moreno, Erika Moreno (MRN [id number]) as of 11/05/2013 09:00  Ref. Range 08/20/2013 14:00 09/10/2013 08:30 10/08/2013 09:10  CA 125 Latest Range: 0.0-30.2 U/mL 100.4 (H) 118.3 (H) 121.2 (H)     Infusion on 11/05/2013  Component Date Value Ref Range Status  . WBC 11/05/2013 5.2  4.0 - 10.5 K/uL Final  . RBC 11/05/2013 3.27* 3.87 - 5.11 MIL/uL Final  . Hemoglobin 11/05/2013 10.4* 12.0 - 15.0 g/dL Final  . HCT 11/05/2013 31.9* 36.0 - 46.0 % Final  . MCV 11/05/2013 97.6  78.0 - 100.0 fL Final  . MCH 11/05/2013 31.8  26.0 - 34.0 pg Final  . MCHC 11/05/2013 32.6  30.0 - 36.0 g/dL Final  . RDW  11/05/2013 18.1* 11.5 - 15.5 % Final  . Platelets 11/05/2013 206  150 - 400 K/uL Final  . Neutrophils Relative % 11/05/2013 71  43 - 77 % Final  . Neutro Abs 11/05/2013 3.6  1.7 - 7.7 K/uL Final  . Lymphocytes Relative 11/05/2013 18  12 - 46 % Final  . Lymphs Abs 11/05/2013 0.9  0.7 - 4.0 K/uL Final  . Monocytes Relative 11/05/2013 11  3 - 12 % Final  . Monocytes Absolute 11/05/2013 0.6  0.1 - 1.0 K/uL Final  . Eosinophils Relative 11/05/2013 0  0 - 5 % Final  . Eosinophils Absolute 11/05/2013 0.0  0.0 - 0.7 K/uL Final  . Basophils Relative 11/05/2013 0  0 - 1 % Final  . Basophils Absolute 11/05/2013 0.0  0.0 - 0.1 K/uL Final  . Sodium 11/05/2013 139  137 - 147 mEq/L Final  . Potassium 11/05/2013 4.4  3.7 - 5.3 mEq/L Final  . Chloride 11/05/2013 98  96 - 112 mEq/L Final  . CO2 11/05/2013 31  19 - 32 mEq/L Final  . Glucose, Bld 11/05/2013 74  70 - 99 mg/dL Final  . BUN 11/05/2013 19  6 - 23 mg/dL Final  . Creatinine, Ser 11/05/2013 0.83  0.50 - 1.10 mg/dL Final  . Calcium 11/05/2013 9.7  8.4 - 10.5 mg/dL Final  . Total Protein 11/05/2013 7.3  6.0 - 8.3 g/dL Final  . Albumin 11/05/2013 3.6  3.5 -  5.2 g/dL Final  . AST 11/05/2013 22  0 - 37 U/L Final  . ALT 11/05/2013 17  0 - 35 U/L Final  . Alkaline Phosphatase 11/05/2013 61  39 - 117 U/L Final  . Total Bilirubin 11/05/2013 0.2* 0.3 - 1.2 mg/dL Final  . GFR calc non Af Amer 11/05/2013 71* >90 mL/min Final  . GFR calc Af Amer 11/05/2013 82* >90 mL/min Final   Comment: (NOTE)                          The eGFR has been calculated using the CKD EPI equation.                          This calculation has not been validated in all clinical situations.                          eGFR's persistently <90 mL/min signify possible Chronic Kidney                          Disease.  . Anion gap 11/05/2013 10  5 - 15 Final  Infusion on 10/08/2013  Component Date Value Ref Range Status  . WBC 10/08/2013 4.3  4.0 - 10.5 K/uL Final  . RBC 10/08/2013  3.47* 3.87 - 5.11 MIL/uL Final  . Hemoglobin 10/08/2013 11.0* 12.0 - 15.0 g/dL Final  . HCT 10/08/2013 33.3* 36.0 - 46.0 % Final  . MCV 10/08/2013 96.0  78.0 - 100.0 fL Final  . MCH 10/08/2013 31.7  26.0 - 34.0 pg Final  . MCHC 10/08/2013 33.0  30.0 - 36.0 g/dL Final  . RDW 10/08/2013 16.7* 11.5 - 15.5 % Final  . Platelets 10/08/2013 233  150 - 400 K/uL Final  . Neutrophils Relative % 10/08/2013 65  43 - 77 % Final  . Neutro Abs 10/08/2013 2.8  1.7 - 7.7 K/uL Final  . Lymphocytes Relative 10/08/2013 25  12 - 46 % Final  . Lymphs Abs 10/08/2013 1.1  0.7 - 4.0 K/uL Final  . Monocytes Relative 10/08/2013 9  3 - 12 % Final  . Monocytes Absolute 10/08/2013 0.4  0.1 - 1.0 K/uL Final  . Eosinophils Relative 10/08/2013 0  0 - 5 % Final  . Eosinophils Absolute 10/08/2013 0.0  0.0 - 0.7 K/uL Final  . Basophils Relative 10/08/2013 1  0 - 1 % Final  . Basophils Absolute 10/08/2013 0.0  0.0 - 0.1 K/uL Final  . Sodium 10/08/2013 140  137 - 147 mEq/L Final  . Potassium 10/08/2013 4.4  3.7 - 5.3 mEq/L Final  . Chloride 10/08/2013 101  96 - 112 mEq/L Final  . CO2 10/08/2013 27  19 - 32 mEq/L Final  . Glucose, Bld 10/08/2013 90  70 - 99 mg/dL Final  . BUN 10/08/2013 28* 6 - 23 mg/dL Final  . Creatinine, Ser 10/08/2013 1.00  0.50 - 1.10 mg/dL Final  . Calcium 10/08/2013 9.8  8.4 - 10.5 mg/dL Final  . Total Protein 10/08/2013 7.2  6.0 - 8.3 g/dL Final  . Albumin 10/08/2013 3.7  3.5 - 5.2 g/dL Final  . AST 10/08/2013 17  0 - 37 U/L Final  . ALT 10/08/2013 13  0 - 35 U/L Final  . Alkaline Phosphatase 10/08/2013 58  39 - 117 U/L Final  . Total Bilirubin 10/08/2013 0.2* 0.3 - 1.2 mg/dL Final  .  GFR calc non Af Amer 10/08/2013 57* >90 mL/min Final  . GFR calc Af Amer 10/08/2013 66* >90 mL/min Final   Comment: (NOTE)                          The eGFR has been calculated using the CKD EPI equation.                          This calculation has not been validated in all clinical situations.                           eGFR's persistently <90 mL/min signify possible Chronic Kidney                          Disease.  . Anion gap 10/08/2013 12  5 - 15 Final  . CA 125 10/08/2013 121.2* 0.0 - 30.2 U/mL Final   Performed at [place]: No new pathology.  Urinalysis    Component Value Date/Time   COLORURINE YELLOW 02/10/2013 1650   APPEARANCEUR CLOUDY* 02/10/2013 1650   LABSPEC >1.030* 08/20/2013 1339   PHURINE 5.5 08/20/2013 1339   GLUCOSEU NEGATIVE 08/20/2013 1339   HGBUR NEGATIVE 08/20/2013 1339   BILIRUBINUR NEGATIVE 08/20/2013 1339   KETONESUR NEGATIVE 08/20/2013 1339   PROTEINUR NEGATIVE 08/20/2013 1339   UROBILINOGEN 0.2 08/20/2013 1339   NITRITE NEGATIVE 08/20/2013 1339   LEUKOCYTESUR NEGATIVE 08/20/2013 1339    RADIOGRAPHIC STUDIES: No results found.  ASSESSMENT:  1. Progressive primary peritoneal carcinomatosis with involvement of cervical, thoracic, and abdominal lymph nodes without any reoccurrence of abdominal masses, good tolerance of initial Doxil treatment but with hand-foot syndrome, for cycle #3 today.  2. Right T12-L1 dermatome herpes zoster outbreak, improved with residual neuritis.    PLAN:  #1. Cycle #3 of Doxil today. #2. Repeat CT chest abdomen and pelvis along with CT of the neck in 2 weeks. #3. Followup in 4 weeks with CBC, chem profile, CEA 125.   All questions were answered. The patient knows to call the clinic with any problems, questions or concerns. We can certainly see the patient much sooner if necessary.   I spent 25 minutes counseling the patient face to face. The total time spent in the appointment was 30 minutes.    Doroteo Bradford, MD 11/05/2013 10:21 AM  DISCLAIMER:  This note was dictated with voice recognition software.  Similar sounding words can inadvertently be transcribed inaccurately and may not be corrected upon review.

## 2013-11-05 NOTE — Addendum Note (Signed)
Addended by: Berneta Levins on: 11/05/2013 11:20 AM   Modules accepted: Orders

## 2013-11-05 NOTE — Progress Notes (Signed)
..  STAR Program Physical Impairment and Functional Assessment Screening Tool  1. Are you having any pain, including headaches, joint pain, or muscle pain (upper body = OT; lower body = PT)?  Yes, this started after my diagnosis and is still a problem.  2. Do your hands and/or feet feel numb or tingle (PT)?  Yes, this started after my diagnosis and is still a problem.  3. Does any part of your body feel swollen or larger than usual (upper body = OT; lower body = PT)?  Yes, this started after my diagnosis and is still a problem.  4. Are you so tired that you cannot do the things you want or need to do (PT or OT)?  Yes, this started after my diagnosis and is still a problem.  5. Are you feeling weak or are you having trouble moving any part of your body (PT/OT)?  Yes, this started after my diagnosis and is still a problem.  6. Are you having trouble concentrating, thinking, or remembering things (OT/ST)?  Yes, this started after my diagnosis and is still a problem.  7. Are you having trouble moving around or feel like you might trip or fall (PT)?  Yes, this started after my diagnosis and is still a problem.  8. Are you having trouble swallowing (ST)?  No  9. Are you having trouble speaking (ST)?  No  10. Are you having trouble with going or getting to the bathroom (OT)?  No  11. Are you having trouble with your sexual function (OT)?  No  12. Are you having trouble lifting things, even just your arms (OT/PT)?  Yes, this started after my diagnosis and is still a problem.  82. Are you having trouble taking care of yourself as in dressing or bathing (OT)?  No  14. Are you having trouble with daily tasks like chores or shopping (OT)?  Yes, this started after my diagnosis and is still a problem.  15. Are you having trouble driving (OT)?  No  16. Are you having trouble returning to work or completing your tasks at work (OT)?  No  Other concerns: " Any kind of  exertion exhausts me. I stay busy all the time but I am so slow it takes forever to finish any project".  Legend: OT = Occupational Therapy PT = Physical Therapy ST = Speech Therapy

## 2013-11-05 NOTE — Progress Notes (Signed)
Tolerated well

## 2013-11-19 ENCOUNTER — Ambulatory Visit (HOSPITAL_COMMUNITY)
Admission: RE | Admit: 2013-11-19 | Discharge: 2013-11-19 | Disposition: A | Discharge status: Home / Self Care | Payer: PRIVATE HEALTH INSURANCE | Source: Ambulatory Visit | Attending: Hematology and Oncology | Admitting: Hematology and Oncology

## 2013-11-19 DIAGNOSIS — C801 Malignant (primary) neoplasm, unspecified: Secondary | ICD-10-CM

## 2013-11-19 DIAGNOSIS — R599 Enlarged lymph nodes, unspecified: Secondary | ICD-10-CM | POA: Insufficient documentation

## 2013-11-19 DIAGNOSIS — R1904 Left lower quadrant abdominal swelling, mass and lump: Secondary | ICD-10-CM | POA: Diagnosis not present

## 2013-11-19 DIAGNOSIS — C786 Secondary malignant neoplasm of retroperitoneum and peritoneum: Secondary | ICD-10-CM | POA: Diagnosis present

## 2013-11-19 MED ORDER — IOHEXOL 300 MG/ML  SOLN
100.0000 mL | Freq: Once | INTRAMUSCULAR | Status: AC | PRN
  Administered 2013-11-19: 100 mL via INTRAVENOUS

## 2013-11-19 MED ORDER — HEPARIN SOD (PORK) LOCK FLUSH 100 UNIT/ML IV SOLN
INTRAVENOUS | Status: AC
  Administered 2013-11-19: 500 [IU] via INTRAVENOUS
  Filled 2013-11-19: qty 5

## 2013-11-20 MED ORDER — HEPARIN SOD (PORK) LOCK FLUSH 100 UNIT/ML IV SOLN
500.0000 [IU] | Freq: Once | INTRAVENOUS | Status: AC
  Administered 2013-11-19: 500 [IU] via INTRAVENOUS

## 2013-11-21 ENCOUNTER — Other Ambulatory Visit (HOSPITAL_COMMUNITY): Payer: Self-pay | Admitting: Oncology

## 2013-11-25 NOTE — Progress Notes (Signed)
[name], MD [place] [id number]  Cancer of unknown origin - Plan: Oxycodone HCl 10 MG TABS, CT Biopsy, ALPRAZolam (XANAX) 0.25 MG tablet  Anemia due to chemotherapy  Muscle strain - Plan: cyclobenzaprine (FLEXERIL) 10 MG tablet  CURRENT THERAPY: Doxil monthly beginning 09/10/2013  INTERVAL HISTORY: Erika Moreno 68 y.o. 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     Cancer of unknown origin   11/15/2012 - 02/21/2013 Chemotherapy Carboplatin/Paclitaxel   11/15/2012 Treatment Plan Change Transfer of care to [facility] from [place]   03/22/2013 Remission PET scan- CR   04/16/2013 - 08/20/2013 Chemotherapy Avastin maintenance every 21 days   08/29/2013 Progression PET- progression of disease   09/05/2013 Imaging [facility] demonstrates EF of 63%   09/10/2013 -  Chemotherapy Doxil every 28 days   I personally reviewed and went over laboratory results with the patient.  The results are noted within this dictation.  I personally reviewed and went over radiographic studies with the patient.  The results are noted within this dictation.  CT CAP on 8/17 demonstrates: 1. Worsening adenopathy in the chest. Mild increase in size of upper  normal sized lymph nodes in the abdomen which were formerly  hypermetabolic.  2. Stable cystic mass in the left anatomic pelvis, which previously  had a hypermetabolic focus along its upper margin.  She reports "I don't feel like the chemotherapy is working."  This is partly true in the sense that she is demonstrating minimal signs of progression, but Doxil is slowing her progression of disease which is evident by the miniscule progression noted on CT imaging. However, with her fear, now is a  good time to re-biopsy and send tissue to [facility] testing.  The left axilla will likely be the best option.  She is agreeable to this plan.  As a result, we will defer chemotherapy x 1 week to accomadate this procedure.  We will then restart Doxil until we get results from [place] at which time future chemotherapeutic options can be discussed.   She notes a few complaints: 1. Fall last week while moving a rock when she was mowing.  She describes the situation.  She denies hitting her head.  She denies LOC.  She denies loss of bowel and bladder control.   2. Secondary to #1, she notes back pain in her middle back and right side.  Exam shows an ecchymosis of the right thoracic area lateral to the spine. 3. Rash on the thoracic area- she actually has more of an excoriation, likely from her fall.  She reports that it is tender and burning.  I do not see any pustular formation.  Otherwise, she denies any complaints and ROS questioning is negative.  Past Medical History  Diagnosis Date  . 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 Cervical cancer 1984  . Myocardial infarction 20 yrs ago  . Coronary artery disease   . Anxiety   . Chronic back pain   . Depression   . GERD (gastroesophageal reflux disease)   . Hypertension   . Hypothyroidism   . Anemia   . Vitamin B 12 deficiency   . Vitamin D deficiency disease     has Cancer of  unknown origin; Anemia due to chemotherapy; Dyspnea on exertion; and Neuropathy due to chemotherapeutic drug on her problem list.     is allergic to avastin; morphine and related; reglan; and sulfa antibiotics.  Ms. Nevins had no medications administered during this visit.  Past Surgical History  Procedure Laterality Date  . Heart bypass      triple bypass with stents  . Coronary artery bypass graft      3 vessels  . Cardiac catheterization      stents x3  . Abdominal hysterectomy    . Portacath placement Right  12/06/2012    Procedure: INSERTION PORT-A-CATH;  Surgeon: Donato Heinz, MD;  Location: [facility];  Service: General;  Laterality: Right;    Denies any headaches, dizziness, double vision, fevers, chills, night sweats, nausea, vomiting, diarrhea, constipation, chest pain, heart palpitations, shortness of breath, blood in stool, black tarry stool, urinary pain, urinary burning, urinary frequency, hematuria.   PHYSICAL EXAMINATION  ECOG PERFORMANCE STATUS: 1 - Symptomatic but completely ambulatory  Filed Vitals:   11/26/13 1000  Pulse: 67  Temp: 98.3 F (36.8 C)  Resp: 16    GENERAL:alert, no distress, comfortable, cooperative and smiling SKIN: skin color, texture, turgor are normal, no rashes or significant lesions HEAD: Normocephalic, No masses, lesions, tenderness or abnormalities EYES: normal, PERRLA, EOMI, Conjunctiva are pink and non-injected EARS: External ears normal OROPHARYNX:mucous membranes are moist  NECK: supple, no adenopathy, thyroid normal size, non-tender, without nodularity, no stridor, non-tender, trachea midline LYMPH:  Left axillary lymph node anterior clavicular line measuring about 1 cm. BREAST:not examined LUNGS: clear to auscultation and percussion HEART: regular rate & rhythm, no murmurs, no gallops, S1 normal and S2 normal ABDOMEN:abdomen soft, non-tender, normal bowel sounds and no masses or organomegaly BACK: Back symmetric, no curvature. Right upper thoracic ecchymosis measuring a little larger than a quarter and a right lateral thoracic excoriated rash. EXTREMITIES:less then 2 second capillary refill, no joint deformities, effusion, or inflammation, no edema, no skin discoloration, no clubbing, no cyanosis  NEURO: alert & oriented x 3 with fluent speech, no focal motor/sensory deficits, gait normal   LABORATORY DATA: CBC    Component Value Date/Time   WBC 5.2 11/05/2013 0912   RBC 3.27* 11/05/2013 0912   HGB 10.4* 11/05/2013 0912   HCT 31.9* 11/05/2013  0912   PLT 206 11/05/2013 0912   MCV 97.6 11/05/2013 0912   MCH 31.8 11/05/2013 0912   MCHC 32.6 11/05/2013 0912   RDW 18.1* 11/05/2013 0912   LYMPHSABS 0.9 11/05/2013 0912   MONOABS 0.6 11/05/2013 0912   EOSABS 0.0 11/05/2013 0912   BASOSABS 0.0 11/05/2013 0912      Chemistry      Component Value Date/Time   NA 139 11/05/2013 0912   K 4.4 11/05/2013 0912   CL 98 11/05/2013 0912   CO2 31 11/05/2013 0912   BUN 19 11/05/2013 0912   CREATININE 0.83 11/05/2013 0912      Component Value Date/Time   CALCIUM 9.7 11/05/2013 0912   ALKPHOS 61 11/05/2013 0912   AST 22 11/05/2013 0912   ALT 17 11/05/2013 0912   BILITOT 0.2* 11/05/2013 0912     Lab Results  Component Value Date   CA125 103.7* 11/05/2013     RADIOGRAPHIC STUDIES:  11/19/2013  CLINICAL DATA: Peritoneal carcinomatosis.  EXAM:  CT CHEST, ABDOMEN, AND PELVIS WITH CONTRAST  TECHNIQUE:  Multidetector CT imaging of the chest, abdomen and pelvis was  performed following the standard protocol during bolus  administration of intravenous contrast.  CONTRAST: 138mL OMNIPAQUE IOHEXOL 300 MG/ML SOLN  COMPARISON: Multiple exams, including 08/29/2013  FINDINGS:  CT CHEST FINDINGS  Enlarging left axillary adenopathy. Index left axillary node 1.3 cm  on image 10 of series 2, formerly 0.8 cm. These lymph nodes were  formerly hypermetabolic. Mildly enlarged subpectoral lymph nodes on  the right, formerly hypermetabolic.  Right lower paratracheal heterogeneous lymph node, short axis  diameter 1.3 cm, formerly 1.1 cm. Scattered additional small AP  window and paratracheal nodes observed. Right hilar lymph node 1.4  cm in short axis and subcarinal lymph node 1.4 cm in short axis,  both previously hypermetabolic.  Considerable atherosclerotic vascular disease involving the coronary  arteries, with prior CABG noted. Right infrahilar lymph node 1.0 cm  in short axis.  CT ABDOMEN AND PELVIS FINDINGS  The liver, spleen, pancreas, and adrenal glands appear unremarkable.   The gallbladder is mildly contracted. No biliary dilatation.  Kidneys and proximal ureters unremarkable. Aortoiliac  atherosclerotic vascular disease. Scattered relatively small but  previously hypermetabolic mesenteric and retroperitoneal adenopathy.  An index left posterior periaortic lymph node measures 0.8 cm in  short axis, formerly 0.7 cm. A mesenteric lymph node on image 70 of  series 2 measures 0.9 cm in short axis, formerly 0.7 cm.  No nodularity along the liver margin or definite mass along the  omentum. Primarily cystic lesion in the left anatomic pelvis, 7.1 x  4.0 cm (essentially stable) with higher density posterior component.  There was previously a hypermetabolic nodule along the upper margin  of this structure, indicating malignancy.  7 mm of anterolisthesis at L4-5 with posterior element fusion and  mild associated central stenosis. Facet spurring causes bilateral  foraminal stenosis at L5-S1.  IMPRESSION:  1. Worsening adenopathy in the chest. Mild increase in size of upper  normal sized lymph nodes in the abdomen which were formerly  hypermetabolic.  2. Stable cystic mass in the left anatomic pelvis, which previously  had a hypermetabolic focus along its upper margin.  Electronically Signed  By: Sherryl Barters M.D.  On: 11/19/2013 09:33    ASSESSMENT:  1. Progressive primary peritoneal carcinomatosis with involvement of cervical, thoracic, and abdominal lymph nodes with slight progression of disease on most recent restaging CT imaging on 11/29/2013. 2. Right T12-L1 dermatome herpes zoster outbreak, improved with residual neuritis. 3. Fall last week 4. Excoriated area on right lateral thoracic area 5. Back pain with ecchymosis on right thoracic back  Patient Active Problem List   Diagnosis Date Noted  . Dyspnea on exertion 02/10/2013  . Neuropathy due to chemotherapeutic drug 02/10/2013  . Anemia due to chemotherapy 01/24/2013  . Cancer of unknown origin  11/14/2012     PLAN:  1. I personally reviewed and went over laboratory results with the patient.  The results are noted within this dictation. 2. I personally reviewed and went over radiographic studies with the patient.  The results are noted within this dictation.   3. Refill on Oxycodone 4. Rx for Flexeril 10 mg TID 5. Refill on Xanax 6. CT-guided biopsy of left axilla, requesting [employer] testing of sample to guide treatment options 7. Defer chemotherapy x 1 week to allow for biopsy. 8. Return with next cycle of chemotherapy for follow-up  THERAPY PLAN:  Given her slight progression of disease and her concern about that, we will re-biopsy and send tissue for [employer] testing to help guide therapy.  She appears to be tolerating Doxil, so we will defer chemotherapy  x 1 week to allow for a biopsy and provide [place] more time to get results from [place].  All questions were answered. The patient knows to call the clinic with any problems, questions or concerns. We can certainly see the patient much sooner if necessary.  Patient and plan discussed with Dr. Farrel Gobble and he is in agreement with the aforementioned.   Cortavius Montesinos 11/26/2013

## 2013-11-26 ENCOUNTER — Encounter (HOSPITAL_BASED_OUTPATIENT_CLINIC_OR_DEPARTMENT_OTHER): Payer: PRIVATE HEALTH INSURANCE | Admitting: Oncology

## 2013-11-26 ENCOUNTER — Encounter (HOSPITAL_COMMUNITY): Payer: Self-pay | Admitting: Oncology

## 2013-11-26 VITALS — HR 67 | Temp 98.3°F | Resp 16 | Wt 141.5 lb

## 2013-11-26 DIAGNOSIS — C801 Malignant (primary) neoplasm, unspecified: Secondary | ICD-10-CM

## 2013-11-26 DIAGNOSIS — T148XXA Other injury of unspecified body region, initial encounter: Secondary | ICD-10-CM

## 2013-11-26 DIAGNOSIS — T451X5A Adverse effect of antineoplastic and immunosuppressive drugs, initial encounter: Secondary | ICD-10-CM

## 2013-11-26 DIAGNOSIS — D6481 Anemia due to antineoplastic chemotherapy: Secondary | ICD-10-CM

## 2013-11-26 MED ORDER — ALPRAZOLAM 0.25 MG PO TABS: 0.2500 mg | ORAL_TABLET | Freq: Four times a day (QID) | ORAL | Status: DC | PRN

## 2013-11-26 MED ORDER — OXYCODONE HCL 10 MG PO TABS: ORAL_TABLET | ORAL | Status: DC

## 2013-11-26 MED ORDER — CYCLOBENZAPRINE HCL 10 MG PO TABS: 10.0000 mg | ORAL_TABLET | Freq: Three times a day (TID) | ORAL | Status: DC | PRN

## 2013-11-26 NOTE — Patient Instructions (Signed)
[facility] Discharge Instructions  RECOMMENDATIONS MADE BY THE CONSULTANT AND ANY TEST RESULTS WILL BE SENT TO YOUR REFERRING PHYSICIAN.  EXAM FINDINGS BY THE PHYSICIAN TODAY AND SIGNS OR SYMPTOMS TO REPORT TO CLINIC OR PRIMARY PHYSICIAN: Exam and findings as discussed by Robynn Pane, PA-C.  Will get Interventional Radiology in [place] to do a biopsy of one for your lymph nodes and have some special testing performed.  They will call you with the date and time of the test. Will delay chemotherapy until 12/11/13.    MEDICATIONS PRESCRIBED:  Refill for oxycodone Alprazolam - should still have 2 refills left Use hydrocortisone cream to the area on right posterior axillary area - can alternate it with antibiotic ointment   INSTRUCTIONS/FOLLOW-UP: CT guided biopsy Chemotherapy and follow-up on 12/11/13  Thank you for choosing [facility] to provide your oncology and hematology care.  To afford each patient quality time with our providers, please arrive at least 15 minutes before your scheduled appointment time.  With your help, our goal is to use those 15 minutes to complete the necessary work-up to ensure our physicians have the information they need to help with your evaluation and healthcare recommendations.    Effective January 1st, 2014, we ask that you re-schedule your appointment with our physicians should you arrive 10 or more minutes late for your appointment.  We strive to give you quality time with our providers, and arriving late affects you and other patients whose appointments are after yours.    Again, thank you for choosing [facility].  Our hope is that these requests will decrease the amount of time that you wait before being seen by our physicians.       _____________________________________________________________  Should you have questions after your visit to [facility], please contact our office at [phone]  [phone] between the hours of 8:30 a.m. and 4:30 p.m.  Voicemails left after 4:30 p.m. will not be returned until the following business day.  For prescription refill requests, have your pharmacy contact our office with your prescription refill request.    _______________________________________________________________  We hope that we have given you very good care.  You may receive a patient satisfaction survey in the mail, please complete it and return it as soon as possible.  We value your feedback!  _______________________________________________________________  Have you asked about our STAR program?  STAR stands for Survivorship Training and Rehabilitation, and this is a nationally recognized cancer care program that focuses on survivorship and rehabilitation.  Cancer and cancer treatments may cause problems, such as, pain, making you feel tired and keeping you from doing the things that you need or want to do. Cancer rehabilitation can help. Our goal is to reduce these troubling effects and help you have the best quality of life possible.  You may receive a survey from a nurse that asks questions about your current state of health.  Based on the survey results, all eligible patients will be referred to the [facility] program for an evaluation so we can better serve you!  A frequently asked questions sheet is available upon request.

## 2013-11-27 ENCOUNTER — Ambulatory Visit (HOSPITAL_COMMUNITY): Payer: PRIVATE HEALTH INSURANCE | Admitting: Oncology

## 2013-11-28 ENCOUNTER — Other Ambulatory Visit (HOSPITAL_COMMUNITY): Payer: Self-pay | Admitting: Oncology

## 2013-11-28 ENCOUNTER — Telehealth (HOSPITAL_COMMUNITY): Payer: Self-pay | Admitting: Oncology

## 2013-11-28 NOTE — Telephone Encounter (Signed)
Patient called.  Her biopsy appt is 9/9.  Therefore, we will defer chemotherapy an additional week to accommodate this procedure.  She is agreeable about this, but hesitant about another week off chemotherapy.  Erika Moreno 11/28/2013

## 2013-12-03 ENCOUNTER — Inpatient Hospital Stay (HOSPITAL_COMMUNITY): Payer: PRIVATE HEALTH INSURANCE

## 2013-12-03 ENCOUNTER — Ambulatory Visit (HOSPITAL_COMMUNITY): Payer: PRIVATE HEALTH INSURANCE

## 2013-12-04 ENCOUNTER — Other Ambulatory Visit: Payer: Self-pay | Admitting: Radiology

## 2013-12-05 ENCOUNTER — Encounter (HOSPITAL_COMMUNITY): Payer: Self-pay | Admitting: Pharmacy Technician

## 2013-12-05 ENCOUNTER — Other Ambulatory Visit: Payer: Self-pay | Admitting: Radiology

## 2013-12-11 ENCOUNTER — Inpatient Hospital Stay (HOSPITAL_COMMUNITY): Payer: PRIVATE HEALTH INSURANCE

## 2013-12-11 ENCOUNTER — Ambulatory Visit (HOSPITAL_COMMUNITY): Payer: PRIVATE HEALTH INSURANCE | Admitting: Oncology

## 2013-12-12 ENCOUNTER — Ambulatory Visit (HOSPITAL_COMMUNITY)
Admission: RE | Admit: 2013-12-12 | Discharge: 2013-12-12 | Disposition: A | Discharge status: Home / Self Care | Payer: PRIVATE HEALTH INSURANCE | Source: Ambulatory Visit | Attending: Oncology | Admitting: Oncology

## 2013-12-12 ENCOUNTER — Encounter (HOSPITAL_COMMUNITY): Payer: Self-pay

## 2013-12-12 DIAGNOSIS — C569 Malignant neoplasm of unspecified ovary: Secondary | ICD-10-CM | POA: Insufficient documentation

## 2013-12-12 DIAGNOSIS — C786 Secondary malignant neoplasm of retroperitoneum and peritoneum: Secondary | ICD-10-CM | POA: Insufficient documentation

## 2013-12-12 DIAGNOSIS — C801 Malignant (primary) neoplasm, unspecified: Secondary | ICD-10-CM

## 2013-12-12 LAB — CBC
HCT: 28.5 % — ABNORMAL LOW (ref 36.0–46.0)
Hemoglobin: 9 g/dL — ABNORMAL LOW (ref 12.0–15.0)
MCH: 29.8 pg (ref 26.0–34.0)
MCHC: 31.6 g/dL (ref 30.0–36.0)
MCV: 94.4 fL (ref 78.0–100.0)
Platelets: 189 10*3/uL (ref 150–400)
RBC: 3.02 MIL/uL — ABNORMAL LOW (ref 3.87–5.11)
RDW: 17.8 % — AB (ref 11.5–15.5)
WBC: 4.7 10*3/uL (ref 4.0–10.5)

## 2013-12-12 LAB — PROTIME-INR
INR: 0.99 (ref 0.00–1.49)
Prothrombin Time: 13.1 seconds (ref 11.6–15.2)

## 2013-12-12 LAB — APTT: aPTT: 30 seconds (ref 24–37)

## 2013-12-12 MED ORDER — SODIUM CHLORIDE 0.9 % IV SOLN
INTRAVENOUS | Status: DC
  Administered 2013-12-12: 50 mL/h via INTRAVENOUS

## 2013-12-12 MED ORDER — FENTANYL CITRATE 0.05 MG/ML IJ SOLN
INTRAMUSCULAR | Status: AC | PRN
  Administered 2013-12-12 (×2): 25 ug via INTRAVENOUS

## 2013-12-12 MED ORDER — FENTANYL CITRATE 0.05 MG/ML IJ SOLN
INTRAMUSCULAR | Status: AC
  Filled 2013-12-12: qty 2

## 2013-12-12 MED ORDER — MIDAZOLAM HCL 2 MG/2ML IJ SOLN
INTRAMUSCULAR | Status: AC
  Filled 2013-12-12: qty 2

## 2013-12-12 MED ORDER — MIDAZOLAM HCL 2 MG/2ML IJ SOLN
INTRAMUSCULAR | Status: AC | PRN
  Administered 2013-12-12 (×2): 1 mg via INTRAVENOUS

## 2013-12-12 MED ORDER — LIDOCAINE HCL (PF) 1 % IJ SOLN
INTRAMUSCULAR | Status: AC
  Filled 2013-12-12: qty 10

## 2013-12-12 NOTE — H&P (Signed)
Erika Moreno is an 68 y.o. female.   Chief Complaint: Pt with diagnosis of peritoneal carcinomatosis Approx 1 yr ago she noted a "knot" in L neck Biopsy proved to be cancerous presumed from Breast All breast studies and imaging were negative for breast ca 10/2012 PET + for diffuse lymphadenopathy Chemo therapy was initiated 11/2012 for few months 03/2013 PET neg for activity Follow up 08/2013 PET + for diffuse hypermetabolic LAN Has had new chemotherapy x 3 treatments Last dose 6 weeks ago Now scheduled for biopsy of L axillary LN   OFF Plavix x 5 days  HPI: cancer of unknown origin Cervical ca hx; CAD/MI--stents; HTN; hypothyroid  Past Medical History  Diagnosis Date  . 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 Cervical cancer 1984  . Myocardial infarction 20 yrs ago  . Coronary artery disease   . Anxiety   . Chronic back pain   . Depression   . GERD (gastroesophageal reflux disease)   . Hypertension   . Hypothyroidism   . Anemia   . Vitamin B 12 deficiency   . Vitamin D deficiency disease     Past Surgical History  Procedure Laterality Date  . Heart bypass      triple bypass with stents  . Coronary artery bypass graft      3 vessels  . Cardiac catheterization      stents x3  . Abdominal hysterectomy    . Portacath placement Right 12/06/2012    Procedure: INSERTION PORT-A-CATH;  Surgeon: Donato Heinz, MD;  Location: [facility];  Service: General;  Laterality: Right;    Family History  Problem Relation Age of Onset  . Heart attack Mother   . Heart attack Father    Social History:  reports that she quit smoking about 25 years ago. Her smoking use included Cigarettes. She has a 44 pack-year smoking history. She has never used smokeless tobacco. She reports that she does not drink alcohol or use illicit drugs.  Allergies:  Allergies  Allergen Reactions  . Avastin [Bevacizumab] Hives and Other (See Comments)    Took Prednisone along  with Avastin to decrease hives  . Morphine And Related Other (See Comments)    Hallucinates with large IV doses.  . Reglan [Metoclopramide] Other (See Comments)    Hands shaking  . Sulfa Antibiotics Itching     (Not in a hospital admission)  No results found for this or any previous visit (from the past 48 hour(s)). No results found.  Review of Systems  Constitutional: Negative for fever and weight loss.  Respiratory: Negative for shortness of breath.   Gastrointestinal: Negative for nausea, vomiting and abdominal pain.  Musculoskeletal: Negative for neck pain.  Neurological: Negative for weakness and headaches.  Psychiatric/Behavioral: Negative for substance abuse.    Blood pressure 147/82, pulse 61, temperature 98.4 F (36.9 C), temperature source Oral, resp. rate 20, height 5\' 4"  (1.626 m), weight 63.957 kg (141 lb), SpO2 100.00%. Physical Exam  Constitutional: She is oriented to person, place, and time. She appears well-nourished.  Cardiovascular: Normal rate, regular rhythm and normal heart sounds.   No murmur heard. Respiratory: Effort normal and breath sounds normal. She has no wheezes.  GI: Soft. Bowel sounds are normal. There is no tenderness.  Musculoskeletal: Normal range of motion.  Neurological: She is alert and oriented to person, place, and time.  Skin: Skin is warm and dry.  Psychiatric: She has a normal mood and  affect. Her behavior is normal. Judgment and thought content normal.     Assessment/Plan Cancer of unknown origin Diffuse +PET LAN Has had chemo 2014- still with new +PET now New chemo x 3 txs; last dose 6 weeks ago Now scheduled for L axillary lymph node biopsy Pt aware of procedure benefits and risks and agreeable to proceed Consent signed andin chart   Bebe Moncure A 12/12/2013, 9:41 AM

## 2013-12-12 NOTE — Discharge Instructions (Signed)
Needle Biopsy °Care After °These instructions give you information on caring for yourself after your procedure. Your doctor may also give you more specific instructions. Call your doctor if you have any problems or questions after your procedure. °HOME CARE °· Rest for 4 hours after your biopsy, except for getting up to go to the bathroom or as told. °· Keep the places where the needles were put in clean and dry. °¨ Do not put powder or lotion on the sites. °¨ Do not shower until 24 hours after the test. Remove all bandages (dressings) before showering. °¨ Remove all bandages at least once every day. Gently clean the sites with soap and water. Keep putting a new bandage on until the skin is closed. °Finding out the results of your test °Ask your doctor when your test results will be ready. Make sure you follow up and get the test results. °GET HELP RIGHT AWAY IF:  °· You have shortness of breath or trouble breathing. °· You have pain or cramping in your belly (abdomen). °· You feel sick to your stomach (nauseous) or throw up (vomit). °· Any of the places where the needles were put in: °¨ Are puffy (swollen) or red. °¨ Are sore or hot to the touch. °¨ Are draining yellowish-white fluid (pus). °¨ Are bleeding after 10 minutes of pressing down on the site. Have someone keep pressing on any place that is bleeding until you see a doctor. °· You have any unusual pain that will not stop. °· You have a fever. °If you go to the emergency room, tell the nurse that you had a biopsy. Take this paper with you to show the nurse. °MAKE SURE YOU:  °· Understand these instructions. °· Will watch your condition. °· Will get help right away if you are not doing well or get worse. °Document Released: 03/04/2008 Document Revised: 06/14/2011 Document Reviewed: 03/04/2008 °ExitCare® Patient Information ©2015 [employer]. This information is not intended to replace advice given to you by your health care provider. Make sure you discuss  any questions you have with your health care provider. ° °

## 2013-12-12 NOTE — Procedures (Signed)
US guided biopsy of left axillary lymph node.  6 cores obtained.  No immediate complication.

## 2013-12-13 ENCOUNTER — Other Ambulatory Visit (HOSPITAL_COMMUNITY): Payer: Self-pay | Admitting: Oncology

## 2013-12-18 ENCOUNTER — Inpatient Hospital Stay (HOSPITAL_COMMUNITY): Payer: PRIVATE HEALTH INSURANCE

## 2013-12-19 ENCOUNTER — Encounter (HOSPITAL_COMMUNITY): Payer: Self-pay

## 2013-12-19 ENCOUNTER — Encounter (HOSPITAL_COMMUNITY): Payer: PRIVATE HEALTH INSURANCE | Attending: Internal Medicine

## 2013-12-19 ENCOUNTER — Ambulatory Visit (HOSPITAL_COMMUNITY): Payer: PRIVATE HEALTH INSURANCE | Admitting: Oncology

## 2013-12-19 VITALS — BP 136/79 | HR 72 | Temp 98.4°F | Resp 16 | Wt 140.6 lb

## 2013-12-19 DIAGNOSIS — C482 Malignant neoplasm of peritoneum, unspecified: Secondary | ICD-10-CM

## 2013-12-19 DIAGNOSIS — B029 Zoster without complications: Secondary | ICD-10-CM | POA: Diagnosis present

## 2013-12-19 DIAGNOSIS — C801 Malignant (primary) neoplasm, unspecified: Secondary | ICD-10-CM | POA: Insufficient documentation

## 2013-12-19 DIAGNOSIS — D6481 Anemia due to antineoplastic chemotherapy: Secondary | ICD-10-CM

## 2013-12-19 DIAGNOSIS — T451X5A Adverse effect of antineoplastic and immunosuppressive drugs, initial encounter: Secondary | ICD-10-CM

## 2013-12-19 DIAGNOSIS — C778 Secondary and unspecified malignant neoplasm of lymph nodes of multiple regions: Secondary | ICD-10-CM

## 2013-12-19 DIAGNOSIS — Z5111 Encounter for antineoplastic chemotherapy: Secondary | ICD-10-CM

## 2013-12-19 LAB — CBC WITH DIFFERENTIAL/PLATELET
BASOS ABS: 0 10*3/uL (ref 0.0–0.1)
BASOS PCT: 0 % (ref 0–1)
EOS ABS: 0.1 10*3/uL (ref 0.0–0.7)
Eosinophils Relative: 1 % (ref 0–5)
HCT: 23.2 % — ABNORMAL LOW (ref 36.0–46.0)
HEMOGLOBIN: 7.3 g/dL — AB (ref 12.0–15.0)
Lymphocytes Relative: 14 % (ref 12–46)
Lymphs Abs: 1 10*3/uL (ref 0.7–4.0)
MCH: 31.1 pg (ref 26.0–34.0)
MCHC: 31.5 g/dL (ref 30.0–36.0)
MCV: 98.7 fL (ref 78.0–100.0)
MONOS PCT: 9 % (ref 3–12)
Monocytes Absolute: 0.6 10*3/uL (ref 0.1–1.0)
NEUTROS PCT: 76 % (ref 43–77)
Neutro Abs: 5.3 10*3/uL (ref 1.7–7.7)
Platelets: 233 10*3/uL (ref 150–400)
RBC: 2.35 MIL/uL — ABNORMAL LOW (ref 3.87–5.11)
RDW: 17.3 % — AB (ref 11.5–15.5)
WBC: 7 10*3/uL (ref 4.0–10.5)

## 2013-12-19 LAB — PREPARE RBC (CROSSMATCH)

## 2013-12-19 LAB — COMPREHENSIVE METABOLIC PANEL
ALBUMIN: 3.2 g/dL — AB (ref 3.5–5.2)
ALK PHOS: 74 U/L (ref 39–117)
ALT: 11 U/L (ref 0–35)
ANION GAP: 12 (ref 5–15)
AST: 17 U/L (ref 0–37)
BUN: 23 mg/dL (ref 6–23)
CO2: 29 mEq/L (ref 19–32)
CREATININE: 0.94 mg/dL (ref 0.50–1.10)
Calcium: 9.4 mg/dL (ref 8.4–10.5)
Chloride: 94 mEq/L — ABNORMAL LOW (ref 96–112)
GFR calc Af Amer: 71 mL/min — ABNORMAL LOW (ref >90)
GFR calc non Af Amer: 61 mL/min — ABNORMAL LOW (ref >90)
Glucose, Bld: 112 mg/dL — ABNORMAL HIGH (ref 70–99)
Potassium: 4.3 mEq/L (ref 3.7–5.3)
Sodium: 135 mEq/L — ABNORMAL LOW (ref 137–147)
Total Bilirubin: 0.2 mg/dL — ABNORMAL LOW (ref 0.3–1.2)
Total Protein: 7.3 g/dL (ref 6.0–8.3)

## 2013-12-19 MED ORDER — DIPHENHYDRAMINE HCL 25 MG PO CAPS
25.0000 mg | ORAL_CAPSULE | Freq: Once | ORAL | Status: AC
  Administered 2013-12-19: 25 mg via ORAL
  Filled 2013-12-19: qty 1

## 2013-12-19 MED ORDER — ACETAMINOPHEN 325 MG PO TABS
650.0000 mg | ORAL_TABLET | Freq: Once | ORAL | Status: AC
  Administered 2013-12-19: 650 mg via ORAL
  Filled 2013-12-19: qty 2

## 2013-12-19 MED ORDER — SODIUM CHLORIDE 0.9 % IV SOLN
Freq: Once | INTRAVENOUS | Status: AC
  Administered 2013-12-19: 8 mg via INTRAVENOUS
  Filled 2013-12-19: qty 4

## 2013-12-19 MED ORDER — DEXTROSE 5 % IV SOLN
40.0000 mg/m2 | Freq: Once | INTRAVENOUS | Status: AC
  Administered 2013-12-19: 70 mg via INTRAVENOUS
  Filled 2013-12-19: qty 35

## 2013-12-19 MED ORDER — SODIUM CHLORIDE 0.9 % IJ SOLN
10.0000 mL | INTRAMUSCULAR | Status: DC | PRN
  Administered 2013-12-19: 10 mL

## 2013-12-19 MED ORDER — PROCHLORPERAZINE MALEATE 10 MG PO TABS
10.0000 mg | ORAL_TABLET | Freq: Four times a day (QID) | ORAL | Status: AC | PRN
Start: 2013-12-19 — End: ?

## 2013-12-19 MED ORDER — SODIUM CHLORIDE 0.9 % IV SOLN
Freq: Once | INTRAVENOUS | Status: AC
  Administered 2013-12-19: 10:00:00 via INTRAVENOUS

## 2013-12-19 MED ORDER — HEPARIN SOD (PORK) LOCK FLUSH 100 UNIT/ML IV SOLN
500.0000 [IU] | Freq: Once | INTRAVENOUS | Status: AC | PRN
  Administered 2013-12-19: 500 [IU]
  Filled 2013-12-19: qty 5

## 2013-12-19 NOTE — Patient Instructions (Addendum)
[facility] Discharge Instructions for Patients Receiving Chemotherapy  Today you received the following chemotherapy agents doxcil and 2 units and of blood   To help prevent nausea and vomiting after your treatment, we encourage you to take your nausea medication: Zofran (ondansetron) can cause CONSTIPATION! Alternate Zofran with Compazine (prochlorperazine) for nausea. We called in the Compazine to [facility].   Continue taking Pepcid twice a day for stomach acid.   For constipation, Milk of Magnesia 27ml or 1 tablespoon twice a day until you start having a BM. This overtime could potentially make your bowels loose. Once you have a good bowel movement, stop taking. Repeat as needed. Call us if there is still no Bowel Movement.      BELOW ARE SYMPTOMS THAT SHOULD BE REPORTED IMMEDIATELY:  *FEVER GREATER THAN 100.5 F  *CHILLS WITH OR WITHOUT FEVER  NAUSEA AND VOMITING THAT IS NOT CONTROLLED WITH YOUR NAUSEA MEDICATION  *UNUSUAL SHORTNESS OF BREATH  *UNUSUAL BRUISING OR BLEEDING  TENDERNESS IN MOUTH AND THROAT WITH OR WITHOUT PRESENCE OF ULCERS  *URINARY PROBLEMS  *BOWEL PROBLEMS  UNUSUAL RASH Items with * indicate a potential emergency and should be followed up as soon as possible.  Feel free to call the clinic you have any questions or concerns. The clinic phone number is [phone].

## 2013-12-19 NOTE — Progress Notes (Signed)
Erika Moreno tolerated chemotherapy without incident today.  Also received 2 units of blood

## 2013-12-20 LAB — TYPE AND SCREEN
ABO/RH(D): A POS
Antibody Screen: NEGATIVE
Unit division: 0
Unit division: 0

## 2013-12-26 ENCOUNTER — Encounter (HOSPITAL_COMMUNITY): Payer: Self-pay

## 2013-12-26 ENCOUNTER — Telehealth (HOSPITAL_COMMUNITY): Payer: Self-pay | Admitting: Emergency Medicine

## 2013-12-26 ENCOUNTER — Other Ambulatory Visit (HOSPITAL_COMMUNITY): Payer: Self-pay | Admitting: Hematology and Oncology

## 2013-12-26 MED ORDER — POTASSIUM CHLORIDE CRYS ER 20 MEQ PO TBCR: 20.0000 meq | EXTENDED_RELEASE_TABLET | Freq: Two times a day (BID) | ORAL | Status: AC

## 2013-12-26 NOTE — Telephone Encounter (Signed)
Telephone encounter from [facility] Pathology stating insufficient tissure sample

## 2014-01-01 ENCOUNTER — Other Ambulatory Visit (HOSPITAL_COMMUNITY): Payer: Self-pay

## 2014-01-02 ENCOUNTER — Encounter (HOSPITAL_COMMUNITY): Payer: Self-pay

## 2014-01-02 ENCOUNTER — Encounter (HOSPITAL_BASED_OUTPATIENT_CLINIC_OR_DEPARTMENT_OTHER): Payer: PRIVATE HEALTH INSURANCE

## 2014-01-02 ENCOUNTER — Ambulatory Visit (HOSPITAL_COMMUNITY): Payer: PRIVATE HEALTH INSURANCE

## 2014-01-02 VITALS — BP 128/62 | HR 82 | Temp 98.4°F | Resp 18 | Wt 140.8 lb

## 2014-01-02 DIAGNOSIS — C778 Secondary and unspecified malignant neoplasm of lymph nodes of multiple regions: Secondary | ICD-10-CM

## 2014-01-02 DIAGNOSIS — D6481 Anemia due to antineoplastic chemotherapy: Secondary | ICD-10-CM

## 2014-01-02 DIAGNOSIS — B029 Zoster without complications: Secondary | ICD-10-CM

## 2014-01-02 DIAGNOSIS — T451X5A Adverse effect of antineoplastic and immunosuppressive drugs, initial encounter: Principal | ICD-10-CM

## 2014-01-02 DIAGNOSIS — C786 Secondary malignant neoplasm of retroperitoneum and peritoneum: Secondary | ICD-10-CM

## 2014-01-02 DIAGNOSIS — G609 Hereditary and idiopathic neuropathy, unspecified: Secondary | ICD-10-CM

## 2014-01-02 DIAGNOSIS — C482 Malignant neoplasm of peritoneum, unspecified: Secondary | ICD-10-CM

## 2014-01-02 DIAGNOSIS — M79609 Pain in unspecified limb: Secondary | ICD-10-CM

## 2014-01-02 MED ORDER — EVEROLIMUS 5 MG PO TABS: 5.0000 mg | ORAL_TABLET | Freq: Every day | ORAL | Status: DC

## 2014-01-02 MED ORDER — DARBEPOETIN ALFA-POLYSORBATE 500 MCG/ML IJ SOLN
500.0000 ug | INTRAMUSCULAR | Status: DC
  Administered 2014-01-02: 500 ug via SUBCUTANEOUS

## 2014-01-02 MED ORDER — DARBEPOETIN ALFA-POLYSORBATE 500 MCG/ML IJ SOLN
INTRAMUSCULAR | Status: AC
  Filled 2014-01-02: qty 1

## 2014-01-02 MED ORDER — OXYCODONE HCL 10 MG PO TABS: ORAL_TABLET | ORAL | Status: DC

## 2014-01-02 MED ORDER — METOCLOPRAMIDE HCL 5 MG PO TABS: ORAL_TABLET | ORAL | Status: AC

## 2014-01-02 NOTE — Progress Notes (Signed)
Erika Moreno presents today for injection per MD orders. Aranesp 500 mcg administered SQ in right Abdomen. Administration without incident. Patient tolerated well.

## 2014-01-02 NOTE — Progress Notes (Signed)
[place]  OFFICE PROGRESS NOTE  [name], [place] [id number]  DIAGNOSIS: Anemia due to chemotherapy - Plan: darbepoetin alfa-polysorbate (ARANESP) injection 500 mcg  Chief Complaint  Patient presents with   peritoneal carcinomatosis    CURRENT THERAPY: Doxil intravenously monthly last treatment on 12/19/2013 and received 4 cycles.  INTERVAL HISTORY: Erika Moreno 68 y.o. 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 She had fallen about 3-4 weeks ago and sustained an injury to the left hip. Her PCP did an x-ray of the pelvis and left hip yesterday at [facility] and in the temple we may to get that result. She has noticed some cutaneous nodules on the right lateral abdominal wall with abdominal distention and bloating after eating. She's also had intermittent nausea which was controlled with Zofran. She is taking about 3 or 4 oxycodone per day in order to relieve left hip pain. She denies any cough, wheezing, sore throat, and has had chronic lower 70 swelling that is not worse as the day goes on. She denies any fever, night sweats, and has persistent left axillary and cervical adenopathy.    MEDICAL HISTORY: Past Medical History  Diagnosis Date   Cancer of unknown origin 11/14/2012    Favor breast versus ovarian   Cervical cancer 1984   Myocardial infarction 20 yrs ago   Coronary artery disease    Anxiety    Chronic back pain    Depression    GERD (gastroesophageal reflux disease)    Hypertension    Hypothyroidism    Anemia    Vitamin B 12 deficiency     Vitamin D deficiency disease     INTERIM HISTORY: has Cancer of unknown origin; Anemia due to chemotherapy; Dyspnea on exertion; and Neuropathy due to chemotherapeutic drug on her problem list.     Cancer of unknown origin   11/15/2012 - 02/21/2013 Chemotherapy Carboplatin/Paclitaxel   11/15/2012 Treatment Plan Change Transfer of care to [facility] from [place]   03/22/2013 Remission PET scan- CR   04/16/2013 - 08/20/2013 Chemotherapy Avastin maintenance every 21 days   08/29/2013 Progression PET- progression of disease   09/05/2013 Imaging MUGA demonstrates EF of 63%   09/10/2013 - 12/19/2013 Chemotherapy Doxil every 28 days   12/12/2013 Surgery Biopsy axillary lymph node with foundation One analysis revealing possible sensitivity to mTOR inhibitor.      ALLERGIES:  is allergic to avastin; morphine and related; reglan; and sulfa antibiotics.  MEDICATIONS: has a current medication list which includes the following prescription(s): alprazolam, aspirin, bacitracin, clopidogrel, diphenhydramine, duloxetine, famotidine, hydroxyzine, isosorbide mononitrate, lisinopril-hydrochlorothiazide, magnesium hydroxide, nitroglycerin, ondansetron, oxycodone hcl, oxymetazoline, prednisone, prochlorperazine, sodium chloride, triamcinolone cream, bisacodyl, doxorubicin hcl liposomal, everolimus, furosemide, metoclopramide, OVER THE COUNTER MEDICATION, potassium chloride sa, senna-docusate, and venlafaxine, and the following Facility-Administered Medications: darbepoetin alfa-polysorbate.  SURGICAL HISTORY:  Past Surgical History  Procedure Laterality Date   Heart bypass      triple bypass with stents   Coronary artery bypass graft      3 vessels   Cardiac catheterization      stents x3  Abdominal hysterectomy     Portacath placement Right 12/06/2012    Procedure: INSERTION PORT-A-CATH;  Surgeon: Donato Heinz, MD;  Location: [facility];  Service: General;  Laterality: Right;      FAMILY HISTORY: family history includes Heart attack in her father and mother.  SOCIAL HISTORY:  reports that she quit smoking about 25 years ago. Her smoking use included Cigarettes. She has a 44 pack-year smoking history. She has never used smokeless tobacco. She reports that she does not drink alcohol or use illicit drugs.  REVIEW OF SYSTEMS:  Other than that discussed above is noncontributory.  PHYSICAL EXAMINATION: ECOG PERFORMANCE STATUS: 2 - Symptomatic, <50% confined to bed  Blood pressure 128/62, pulse 82, temperature 98.4 F (36.9 C), temperature source Oral, resp. rate 18, weight 140 lb 12.8 oz (63.866 kg), SpO2 100.00%.  GENERAL:alert, no distress and comfortable SKIN: skin color, texture, turgor are normal, no rashes or significant lesions. Subcutaneous nodule right lateral lower abdominal area posteriorly. EYES: PERLA; Conjunctiva are pink and non-injected, sclera clear SINUSES: No redness or tenderness over maxillary or ethmoid sinuses OROPHARYNX:no exudate, no erythema on lips, buccal mucosa, or tongue. NECK: supple, thyroid normal size, non-tender, without nodularity. No masses CHEST: LifePort in place. No breast masses.Midline sternotomy scar well healed. LYMPH: Left axillary adenopathy. LUNGS: clear to auscultation and percussion with normal breathing effort HEART: regular rate & rhythm and no murmurs. ABDOMEN:abdomen soft, non-tender and normal bowel sounds MUSCULOSKELETAL:no cyanosis of digits and no clubbing. Range of motion normal.  NEURO: alert & oriented x 3 with fluent speech, no focal motor/sensory deficits   LABORATORY DATA: Infusion on 12/19/2013  Component Date Value Ref Range Status   WBC 12/19/2013 7.0  4.0 - 10.5 K/uL Final   RBC 12/19/2013 2.35* 3.87 - 5.11 MIL/uL Final   Hemoglobin 12/19/2013 7.3* 12.0 - 15.0 g/dL Final   HCT 12/19/2013 23.2* 36.0 - 46.0 % Final   MCV 12/19/2013 98.7  78.0 - 100.0 fL Final   MCH 12/19/2013 31.1  26.0 -  34.0 pg Final   MCHC 12/19/2013 31.5  30.0 - 36.0 g/dL Final   RDW 12/19/2013 17.3* 11.5 - 15.5 % Final   Platelets 12/19/2013 233  150 - 400 K/uL Final   Neutrophils Relative % 12/19/2013 76  43 - 77 % Final   Neutro Abs 12/19/2013 5.3  1.7 - 7.7 K/uL Final   Lymphocytes Relative 12/19/2013 14  12 - 46 % Final   Lymphs Abs 12/19/2013 1.0  0.7 - 4.0 K/uL Final   Monocytes Relative 12/19/2013 9  3 - 12 % Final   Monocytes Absolute 12/19/2013 0.6  0.1 - 1.0 K/uL Final   Eosinophils Relative 12/19/2013 1  0 - 5 % Final   Eosinophils Absolute 12/19/2013 0.1  0.0 - 0.7 K/uL Final   Basophils Relative 12/19/2013 0  0 - 1 % Final   Basophils Absolute 12/19/2013 0.0  0.0 - 0.1 K/uL Final   Sodium 12/19/2013 135* 137 - 147 mEq/L Final   Potassium 12/19/2013 4.3  3.7 - 5.3 mEq/L Final   Chloride 12/19/2013 94* 96 - 112 mEq/L Final   CO2 12/19/2013 29  19 - 32 mEq/L Final   Glucose, Bld 12/19/2013 112* 70 - 99 mg/dL Final   BUN 12/19/2013 23  6 - 23 mg/dL Final   Creatinine, Ser 12/19/2013 0.94  0.50 - 1.10 mg/dL Final   Calcium 12/19/2013 9.4  8.4 - 10.5 mg/dL Final   Total Protein 12/19/2013 7.3  6.0 -  8.3 g/dL Final   Albumin 12/19/2013 3.2* 3.5 - 5.2 g/dL Final   AST 12/19/2013 17  0 - 37 U/L Final   ALT 12/19/2013 11  0 - 35 U/L Final   Alkaline Phosphatase 12/19/2013 74  39 - 117 U/L Final   Total Bilirubin 12/19/2013 0.2* 0.3 - 1.2 mg/dL Final   GFR calc non Af Amer 12/19/2013 61* >90 mL/min Final   GFR calc Af Amer 12/19/2013 71* >90 mL/min Final   Comment: (NOTE)                          The eGFR has been calculated using the CKD EPI equation.                          This calculation has not been validated in all clinical situations.                          eGFR's persistently <90 mL/min signify possible Chronic Kidney                          Disease.   Anion gap 12/19/2013 12  5 - 15 Final   CA 125 12/19/2013 SEE SEPARATE REPORT  0.0 - 30.2 U/mL  Final   Order Confirmation 12/19/2013 ORDER PROCESSED BY BLOOD BANK   Final   ABO/RH(D) 12/19/2013 A POS   Final   Antibody Screen 12/19/2013 NEG   Final   Sample Expiration 12/19/2013 12/22/2013   Final   Unit Number 12/19/2013 [id number]   Final   Blood Component Type 12/19/2013 RED CELLS,LR   Final   Unit division 12/19/2013 00   Final   Status of Unit 12/19/2013 ISSUED,FINAL   Final   Transfusion Status 12/19/2013 OK TO TRANSFUSE   Final   Crossmatch Result 12/19/2013 Compatible   Final   Unit Number 12/19/2013 [id number]   Final   Blood Component Type 12/19/2013 RBC LR PHER2   Final   Unit division 12/19/2013 00   Final   Status of Unit 12/19/2013 ISSUED,FINAL   Final   Transfusion Status 12/19/2013 OK TO TRANSFUSE   Final   Crossmatch Result 12/19/2013 Compatible   Final  Hospital Outpatient Visit on 12/12/2013  Component Date Value Ref Range Status   aPTT 12/12/2013 30  24 - 37 seconds Final   WBC 12/12/2013 4.7  4.0 - 10.5 K/uL Final   RBC 12/12/2013 3.02* 3.87 - 5.11 MIL/uL Final   Hemoglobin 12/12/2013 9.0* 12.0 - 15.0 g/dL Final   HCT 12/12/2013 28.5* 36.0 - 46.0 % Final   MCV 12/12/2013 94.4  78.0 - 100.0 fL Final   MCH 12/12/2013 29.8  26.0 - 34.0 pg Final   MCHC 12/12/2013 31.6  30.0 - 36.0 g/dL Final   RDW 12/12/2013 17.8* 11.5 - 15.5 % Final   Platelets 12/12/2013 189  150 - 400 K/uL Final   Prothrombin Time 12/12/2013 13.1  11.6 - 15.2 seconds Final   INR 12/12/2013 0.99  0.00 - 1.49 Final    PATHOLOGY:  FINAL for KYRRA, PRADA ([id number]) Patient: Tilden Dome Collected: 12/12/2013 Client: [facility] Accession: [id number] Received: 12/12/2013 Markus Daft DOB: [birth year] Age: 36 Gender: F Reported: 12/14/2013 [address] Patient Ph: [phone] MRN #: [id number] [place], [place] [address] Visit #: [id number].[place]-[facility]0 Chart #: Phone:  Fax: CC: Robynn Pane,  PA-C REPORT OF SURGICAL PATHOLOGY FINAL  DIAGNOSIS Diagnosis Lymph node, needle/core biopsy, Left axillary - METASTATIC CARCINOMA. - SEE COMMENT. Microscopic Comment The malignant cells are positive for cytokeratin 7, estrogen receptor, p53, and WT-1. They are negative for GCDFP. This immunohistochemical profile favors a gynecologic/peritoneal primary. (JBK:gt, 12/14/13) Enid Cutter MD Pathologist, Electronic Signature (Case signed 12/14/2013) Specimen Gross and Clinical Information Specimen(s) Obtained: Lymph node, needle/core biopsy, Left axillary Specimen Clinical Information Metastatic disease (tl) Gross Received in saline are a few cores and irregular pieces of gray white to red soft tissue, 0.5 x 0.3 x 0.1 cm in aggregate, and entirely submitted in one block for routine histology. (SW:ds 12/12/13) Stain(s) used in Diagnosis: The following stain(s) were used in diagnosing the case: GCDFP, P53, Wilms' Tumor -1, ER - NOACIS, CK-7. The control(s) stained appropriately. Disclaimer Some of these immunohistochemical stains may have been developed and the performance characteristics determined by [facility]. Some may not have been cleared or approved by the U.S. Food and Drug Administration. The FDA has determined that such clearance or approval is not necessary. This test is used for clinical purposes. It should not be regarded as investigational or for research. This laboratory is certified under the [place] (CLIA-88) as qualified to perform high complexity clinical laboratory testing. 1 of 2 FINAL for ANGELLI, BARUCH ([id number]) Report signed out from the following location(s) Technical Component and Interpretation performed at [facility], [place], [place] [address].    Urinalysis    Component Value Date/Time   COLORURINE YELLOW 02/10/2013 1650   APPEARANCEUR CLOUDY* 02/10/2013 1650   LABSPEC >1.030* 08/20/2013 1339   PHURINE 5.5  08/20/2013 1339   GLUCOSEU NEGATIVE 08/20/2013 1339   HGBUR NEGATIVE 08/20/2013 1339   BILIRUBINUR NEGATIVE 08/20/2013 1339   KETONESUR NEGATIVE 08/20/2013 1339   PROTEINUR NEGATIVE 08/20/2013 1339   UROBILINOGEN 0.2 08/20/2013 1339   NITRITE NEGATIVE 08/20/2013 1339   LEUKOCYTESUR NEGATIVE 08/20/2013 1339    RADIOGRAPHIC STUDIES: US Biopsy  12/12/2013   CLINICAL DATA:  History of peritoneal carcinomatosis and stage IV ovarian cancer. Enlarging left axillary lymph nodes. Oncology has requested that the samples be sent to [facility] for analysis.  EXAM: ULTRASOUND-GUIDED BIOPSY OF LEFT AXILLARY LYMPH NODE  Physician: Stephan Minister. Anselm Pancoast, MD  FLUOROSCOPY TIME:  None  MEDICATIONS: 2 mg Versed, 50 mcg fentanyl. A radiology nurse monitored the patient for moderate sedation.  ANESTHESIA/SEDATION: Moderate sedation time: 13 min  PROCEDURE: The procedure was explained to the patient. The risks and benefits of the procedure were discussed and the patient's questions were addressed. Informed consent was obtained from the patient. The left axilla was evaluated with ultrasound. The morphology of the prominent lymph nodes in the left axilla are very similar. An accessible node lateral to the left axillary vessels was targeted. The skin was prepped with Betadine and a sterile field was created. 1% lidocaine was used for local anesthetic. Using ultrasound guidance, a total of 6 core biopsies obtained with an 18 gauge core device. Needle position was confirmed within the lymph node on each occasion. Specimens were placed in saline. Bandage was placed over the puncture site  FINDINGS: There are prominent hypoechoic lymph nodes throughout the left axilla. These lymph nodes are prominent but appear to have normal fatty hila. A prominent lymph node was targeted in the left axilla. Needle position confirmed within the lymph node.  COMPLICATIONS: None  IMPRESSION: Ultrasound-guided core biopsies of an enlarged  left axillary lymph node.    Electronically Signed   By: Markus Daft M.D.   On: 12/12/2013 11:41    ASSESSMENT:  1. Progressive primary peritoneal carcinomatosis with involvement of cervical, thoracic, and abdominal lymph nodes with slight progression of disease on most recent restaging CT imaging on 11/29/2013.  2. Right T12-L1 dermatome herpes zoster outbreak, improved with residual neuritis, worsening. 3. Acute or subacute mild T12 compression fracture on lumbar spine x-ray done in [place] yesterday. Hip x-ray was not done.    PLAN:  #1.  Alternative treatments were discussed including oral Afinitor per [facility] One analysis versus standard treatment using Topotecan IV weekly for 3 weeks out of every four. Afinitor would be given daily for 3 weeks out of every four orally. #2. Aranesp 500 mcg subcutaneously today. #3. Metoclopramide 5 mg 3 times a day and at bedtime to help with abdominal bloating. #4. Increase oxycodone to 10-20 mg every 2-4 hours to control left hip pain. #5. Milk of magnesia 5-15 cc twice a day to maintain bowel movements. #6. Tentative appointment on 01/18/2014 with earlier intervention utilizing either intravenous Topotecan or oral Afinitor based upon third party coverage for the latter. .  All questions were answered. The patient knows to call the clinic with any problems, questions or concerns. We can certainly see the patient much sooner if necessary.   I spent 40 minutes counseling the patient face to face. The total time spent in the appointment was 55 minutes.    Doroteo Bradford, MD 01/03/2014 7:45 PM  DISCLAIMER:  This note was dictated with voice recognition software.  Similar sounding words can inadvertently be transcribed inaccurately and may not be corrected upon review.

## 2014-01-02 NOTE — Patient Instructions (Addendum)
[place] Discharge Instructions  RECOMMENDATIONS MADE BY THE CONSULTANT AND ANY TEST RESULTS WILL BE SENT TO YOUR REFERRING PHYSICIAN.  EXAM FINDINGS BY THE PHYSICIAN TODAY AND SIGNS OR SYMPTOMS TO REPORT TO CLINIC OR PRIMARY PHYSICIAN: Exam and findings as discussed by Dr. Barnet Glasgow.  Treatment options are Afinitor orally  3 weeks out of 4 or Topotecan intravenously weekly for 3 weeks then off 1 week.  Will see which med will be approved and then decide.  Will need to get you scheduled for chemotherapy teaching once we know which agent we are going to use. Your hemoglobin is low and we will give you aranesp today.  MEDICATIONS PRESCRIBED:  Refills for oxycodone  Continue the zofran (ondansetron) Metoclopramide take 5 mg before meals.  If you develop tremors - stop it.   INSTRUCTIONS/FOLLOW-UP: Tentatively 2 weeks  Thank you for choosing [facility] to provide your oncology and hematology care.  To afford each patient quality time with our providers, please arrive at least 15 minutes before your scheduled appointment time.  With your help, our goal is to use those 15 minutes to complete the necessary work-up to ensure our physicians have the information they need to help with your evaluation and healthcare recommendations.    Effective January 1st, 2014, we ask that you re-schedule your appointment with our physicians should you arrive 10 or more minutes late for your appointment.  We strive to give you quality time with our providers, and arriving late affects you and other patients whose appointments are after yours.    Again, thank you for choosing [facility].  Our hope is that these requests will decrease the amount of time that you wait before being seen by our physicians.       _____________________________________________________________  Should you have questions after your visit to [facility], please contact our office  at [phone] between the hours of 8:30 a.m. and 4:30 p.m.  Voicemails left after 4:30 p.m. will not be returned until the following business day.  For prescription refill requests, have your pharmacy contact our office with your prescription refill request.    _______________________________________________________________  We hope that we have given you very good care.  You may receive a patient satisfaction survey in the mail, please complete it and return it as soon as possible.  We value your feedback!  _______________________________________________________________  Have you asked about our STAR program?  STAR stands for Survivorship Training and Rehabilitation, and this is a nationally recognized cancer care program that focuses on survivorship and rehabilitation.  Cancer and cancer treatments may cause problems, such as, pain, making you feel tired and keeping you from doing the things that you need or want to do. Cancer rehabilitation can help. Our goal is to reduce these troubling effects and help you have the best quality of life possible.  You may receive a survey from a nurse that asks questions about your current state of health.  Based on the survey results, all eligible patients will be referred to the [facility] program for an evaluation so we can better serve you!  A frequently asked questions sheet is available upon request.  Topotecan injection What is this medicine? TOPOTECAN (TOE poe TEE kan) is a chemotherapy drug. It is used to treat lung cancer, ovarian cancer, and cervical cancer. This medicine may be used for other purposes; ask your health care provider or pharmacist if you have questions. COMMON BRAND NAME(S):  Hycamtin What should I tell my health care provider before I take this medicine? They need to know if you have any of these conditions: -blood disorders -dehydration -diarrhea -immune system problems -infection (especially a virus infection such as  chickenpox, cold sores, or herpes) -kidney disease -low blood counts, like low white cell, platelet, or red cell counts -recent or ongoing radiation therapy -an unusual or allergic reaction to topotecan, other medicines, foods, dyes, or preservatives -pregnant or trying to get pregnant -breast-feeding How should I use this medicine? This medicine is for infusion into a vein. It is usually given by a health care professional in a hospital or clinic setting. In rare cases, you might get this medicine at home. You will be taught how to give this medicine. Use exactly as directed. Take your medicine at regular intervals. Do not take your medicine more often than directed. It is important that you put your used needles and syringes in a special sharps container. Do not put them in a trash can. If you do not have a sharps container, call your pharmacist or healthcare provider to get one. Talk to your pediatrician regarding the use of this medicine in children. Special care may be needed. Overdosage: If you think you have taken too much of this medicine contact a poison control center or emergency room at once. NOTE: This medicine is only for you. Do not share this medicine with others. What if I miss a dose? It is important not to miss your dose. Call your doctor or health care professional if you are unable to keep an appointment. What may interact with this medicine? -amiodarone -antiviral medicines for HIV or AIDS -cisplatin -clarithromycin -cyclosporine -diltiazem -erythromycin -grapefruit or grapefruit juice -medicines for fungal infections like ketoconazole and itraconazole -mefloquine -mifepristone, RU-486 -nicardipine -phenytoin -propafenone -quinidine -tacrolimus -tamoxifen -testosterone -vaccines -verapamil Talk to your prescriber or health care professional before taking any of these medicines: -aspirin -acetaminophen -ibuprofen -naproxen -ketoprofen This list may not  describe all possible interactions. Give your health care provider a list of all the medicines, herbs, non-prescription drugs, or dietary supplements you use. Also tell them if you smoke, drink alcohol, or use illegal drugs. Some items may interact with your medicine. What should I watch for while using this medicine? This drug may make you feel generally unwell. This is not uncommon, as chemotherapy can affect healthy cells as well as cancer cells. Report any side effects. Continue your course of treatment even though you feel ill unless your doctor tells you to stop. Call your doctor or health care professional for advice if you get a fever, chills or sore throat, or other symptoms of a cold or flu. Do not treat yourself. This drug decreases your body's ability to fight infections. Try to avoid being around people who are sick. This medicine may increase your risk to bruise or bleed. Call your doctor or health care professional if you notice any unusual bleeding. Be careful brushing and flossing your teeth or using a toothpick because you may get an infection or bleed more easily. If you have any dental work done, tell your dentist you are receiving this medicine. Avoid taking products that contain aspirin, acetaminophen, ibuprofen, naproxen, or ketoprofen unless instructed by your doctor. These medicines may hide a fever. Do not become pregnant while taking this medicine. Women should inform their doctor if they wish to become pregnant or think they might be pregnant. There is a potential for serious side effects to an unborn  child. Talk to your health care professional or pharmacist for more information. Do not breast-feed an infant while taking this medicine. What side effects may I notice from receiving this medicine? Side effects that you should report to your doctor or health care professional as soon as possible: -allergic reactions like skin rash, itching or hives, swelling of the face, lips, or  tongue -breathing difficulties -diarrhea -dizziness -fever or chills, sore throat -mouth sores or pain -pain, tingling, numbness in the hands or feet -unusual bleeding or bruising -unusually weak or tired -yellowing of the eyes or skin Side effects that usually do not require medical attention (report to your doctor or health care professional if they continue or are bothersome): -hair loss -headache -loss of appetite -nausea, vomiting -stomach pain This list may not describe all possible side effects. Call your doctor for medical advice about side effects. You may report side effects to FDA at 1-800-FDA-1088. Where should I keep my medicine? Keep out of the reach of children. This drug is usually given in a hospital or clinic and will not be stored at home. In rare cases, this medicine may be given at home. If you are using this medicine at home, you will be instructed on how to store this medicine. Throw away any unused medicine after the expiration date on the label. NOTE: This sheet is a summary. It may not cover all possible information. If you have questions about this medicine, talk to your doctor, pharmacist, or health care provider.  2015, Elsevier/Gold Standard. (2007-12-06 17:25:53) Everolimus tablets What is this medicine? EVEROLIMUS (eve ROE li mus) decreases the activity of your immune system. Afinitor is used to treat certain types of cancer. Zortress is used for kidney and liver transplant rejection prophylaxis. This medicine may be used for other purposes; ask your health care provider or pharmacist if you have questions. COMMON BRAND NAME(S): Afinitor, Zortress What should I tell my health care provider before I take this medicine? They need to know if you have any of these conditions: -diabetes -heart disease -high cholesterol -immune system problems -infection (especially a virus infection such as chickenpox, cold sores, or herpes) -kidney disease -liver  disease -low blood counts, like low white cell, platelet, or red cell counts -rare hereditary problems of galactose intolerance, the Lapp lactase deficiency, or glucose-galactose malabsorption -an unusual or allergic reaction to everolimus, other medicines, foods, dyes, or preservatives -pregnant or trying to get pregnant -breast-feeding How should I use this medicine? Take this medicine by mouth with a full glass of water. Follow the directions on the prescription label. You can take this medicine with or without food, but always take Zortress the same way. Do not cut, crush, or chew this medicine. Do not take with grapefruit juice. Take your medicine at regular intervals. Do not take it more often than directed. Do not stop taking except on your doctor's advice. Talk to your pediatrician regarding the use of this medicine in children. Special care may be needed. Overdosage: If you think you've taken too much of this medicine contact a poison control center or emergency room at once. Overdosage: If you think you have taken too much of this medicine contact a poison control center or emergency room at once. NOTE: This medicine is only for you. Do not share this medicine with others. What if I miss a dose? If you miss a dose, take it as soon as you can. If it is almost time for your next dose, take only that  dose. Do not take double or extra doses. What may interact with this medicine? This medicine may interact with the following medications: -antiviral medicines for HIV or AIDS -aprepitant -carbamazepine -certain medicines for cholesterol like simvastatin -clarithromycin -cyclosporine -dexamethasone -diltiazem -erythromycin -fluconazole -grapefruit juice -itraconazole -ketoconazole -live vaccines -nefazodone -nicardipine -phenobarbital -phenytoin -rifabutin -rifampin -telithromycin -verapamil -voriconazole This list may not describe all possible interactions. Give your health  care provider a list of all the medicines, herbs, non-prescription drugs, or dietary supplements you use. Also tell them if you smoke, drink alcohol, or use illegal drugs. Some items may interact with your medicine. What should I watch for while using this medicine? Visit your doctor or health care professional for regular check-ups. You may need regular tests to monitor possible side effects of the drug. This medicine may affect blood sugar levels. If you have diabetes, check with your doctor or health care professional before you change your diet or the dose of your diabetic medicine. Women should inform their doctor if they wish to become pregnant or think they might be pregnant. There is a potential for serious side effects to an unborn child. Talk to your health care professional or pharmacist for more information. Do not breast-feed an infant while taking this medicine. Afinitor may make you feel generally unwell. This is not uncommon, as chemotherapy can affect healthy cells as well as cancer cells. Report any side effects. Continue your course of treatment even though you feel ill unless your doctor tells you to stop. Call your doctor or health care professional for advice if you get a fever, chills or sore throat, or other symptoms of a cold or flu. Do not treat yourself. This drug decreases your body's ability to fight infections. Try to avoid being around people who are sick. This medicine may increase your risk to bruise or bleed. Call your doctor or health care professional if you notice any unusual bleeding. Be careful brushing and flossing your teeth or using a toothpick because you may get an infection or bleed more easily. If you have any dental work done, tell your dentist you are receiving this medicine. Avoid taking products that contain aspirin, acetaminophen, ibuprofen, naproxen, or ketoprofen unless instructed by your doctor. These medicines may hide a fever. If you have had a kidney  transplant, immediately tell your doctor if your incision site is red, warm, or painful. Also, tell your doctor if your incision site opens up or swells or if contains blood, fluid, or pus. Keep out of the sun. If you cannot avoid being in the sun, wear protective clothing and use sunscreen. Do not use sun lamps or tanning beds/booths. What side effects may I notice from receiving this medicine? Side effects that you should report to your doctor or health care professional as soon as possible: -allergic reactions like skin rash, itching or hives, swelling of the face, lips, or tongue -breathing problems -chest pain -cough -dark urine -fever or chills, sore throat -increased hunger or thirst -increased urination -nausea, vomiting -stomach pain -swelling of ankles, feet, hands -trouble passing urine or change in the amount of urine -unusual bleeding or bruising -unusually weak or tired Side effects that usually do not require medical attention (Report these to your doctor or health care professional if they continue or are bothersome.): -constipation -diarrhea -dizziness -dry mouth or mouth sores -headache -nausea, vomiting This list may not describe all possible side effects. Call your doctor for medical advice about side effects. You may report side effects  to FDA at 1-800-FDA-1088. Where should I keep my medicine? Keep out of the reach of children. Store at room temperature between 15 and 30 degrees C (59 and 86 degrees F). Throw away any unused medicine after the expiration date. NOTE: This sheet is a summary. It may not cover all possible information. If you have questions about this medicine, talk to your doctor, pharmacist, or health care provider.  2015, Elsevier/Gold Standard. (2011-05-25 12:04:14)

## 2014-01-03 LAB — CA 125

## 2014-01-07 ENCOUNTER — Ambulatory Visit (HOSPITAL_COMMUNITY): Payer: PRIVATE HEALTH INSURANCE

## 2014-01-08 ENCOUNTER — Telehealth (HOSPITAL_COMMUNITY): Payer: Self-pay

## 2014-01-08 ENCOUNTER — Other Ambulatory Visit (HOSPITAL_COMMUNITY): Payer: Self-pay | Admitting: Oncology

## 2014-01-08 DIAGNOSIS — C801 Malignant (primary) neoplasm, unspecified: Secondary | ICD-10-CM

## 2014-01-08 MED ORDER — FENTANYL 25 MCG/HR TD PT72: 25.0000 ug | MEDICATED_PATCH | TRANSDERMAL | Status: DC

## 2014-01-08 NOTE — Telephone Encounter (Signed)
Patient notified and will need to pick up prescription.  Baird Cancer, PA-C at 01/08/2014 9:35 AM    Status: Signed       I would like to try a long-acting medication. She had Oxycontin in past, but isurance would not cover. She also had MS contin in past and that was discontinued for a reason (I cannot recollect). Let's try Fentanyl 25 mcg/hr every 3 days. A 15 day supply was written in the event that we may need to increase the dose. She is to update [place] on Friday on whether her pain is better controlled.  KEFALAS,THOMAS  01/08/2014         Mellissa Kohut, RN at 01/08/2014 9:25 AM     Status: Signed        Call from patient - states "I have not heard anything about the chemo pills yet. Also, the pain med I'm taking is not working well. All it does is make me sleep and then what wakes me up is pain. I hurt in my abdomen, my legs and my left hip. The pain is worse in my hip. It's a continuous sharp aching pain."  Financial counselor not available today and will check with her tomorrow regarding Afinitor.  Will discuss with Robynn Pane, PA regarding pain management.

## 2014-01-08 NOTE — Telephone Encounter (Signed)
Call from patient - states "I have not heard anything about the chemo pills yet.  Also, the pain med I'm taking is not working well.  All it does is make me sleep and then what wakes me up is pain.  I hurt in my abdomen, my legs and my left hip.  The pain is worse in my hip.  It's a continuous sharp aching pain."   Financial counselor not available today and will check with her tomorrow regarding Afinitor.  Will discuss with Robynn Pane, PA regarding pain management.

## 2014-01-08 NOTE — Telephone Encounter (Signed)
I would like to try a long-acting medication.  She had Oxycontin in past, but isurance would not cover.  She also had MS contin in past and that was discontinued for a reason (I cannot recollect).  Let's try Fentanyl 25 mcg/hr every 3 days.  A 15 day supply was written in the event that we may need to increase the dose.  She is to update [place] on Friday on whether her pain is better controlled.  KEFALAS,THOMAS 01/08/2014

## 2014-01-11 ENCOUNTER — Inpatient Hospital Stay (HOSPITAL_COMMUNITY): Payer: PRIVATE HEALTH INSURANCE

## 2014-01-11 ENCOUNTER — Encounter (HOSPITAL_COMMUNITY): Payer: Self-pay | Admitting: Oncology

## 2014-01-11 ENCOUNTER — Inpatient Hospital Stay (HOSPITAL_COMMUNITY)
Admission: AD | Admit: 2014-01-11 | Discharge: 2014-01-17 | DRG: 444 | Disposition: A | Discharge status: Home / Self Care | Payer: PRIVATE HEALTH INSURANCE | Source: Other Acute Inpatient Hospital | Attending: Family Medicine | Admitting: Family Medicine

## 2014-01-11 DIAGNOSIS — Z8249 Family history of ischemic heart disease and other diseases of the circulatory system: Secondary | ICD-10-CM | POA: Diagnosis not present

## 2014-01-11 DIAGNOSIS — J810 Acute pulmonary edema: Secondary | ICD-10-CM

## 2014-01-11 DIAGNOSIS — R17 Unspecified jaundice: Secondary | ICD-10-CM | POA: Diagnosis present

## 2014-01-11 DIAGNOSIS — Z7982 Long term (current) use of aspirin: Secondary | ICD-10-CM

## 2014-01-11 DIAGNOSIS — Z8541 Personal history of malignant neoplasm of cervix uteri: Secondary | ICD-10-CM | POA: Diagnosis not present

## 2014-01-11 DIAGNOSIS — E860 Dehydration: Secondary | ICD-10-CM | POA: Diagnosis present

## 2014-01-11 DIAGNOSIS — Z955 Presence of coronary angioplasty implant and graft: Secondary | ICD-10-CM

## 2014-01-11 DIAGNOSIS — Z79899 Other long term (current) drug therapy: Secondary | ICD-10-CM | POA: Diagnosis not present

## 2014-01-11 DIAGNOSIS — T85590A Other mechanical complication of bile duct prosthesis, initial encounter: Secondary | ICD-10-CM

## 2014-01-11 DIAGNOSIS — Z7952 Long term (current) use of systemic steroids: Secondary | ICD-10-CM

## 2014-01-11 DIAGNOSIS — R7989 Other specified abnormal findings of blood chemistry: Secondary | ICD-10-CM

## 2014-01-11 DIAGNOSIS — Z7902 Long term (current) use of antithrombotics/antiplatelets: Secondary | ICD-10-CM | POA: Diagnosis not present

## 2014-01-11 DIAGNOSIS — T404X5A Adverse effect of other synthetic narcotics, initial encounter: Secondary | ICD-10-CM | POA: Diagnosis present

## 2014-01-11 DIAGNOSIS — I10 Essential (primary) hypertension: Secondary | ICD-10-CM | POA: Diagnosis present

## 2014-01-11 DIAGNOSIS — G9340 Encephalopathy, unspecified: Secondary | ICD-10-CM | POA: Diagnosis present

## 2014-01-11 DIAGNOSIS — D6481 Anemia due to antineoplastic chemotherapy: Secondary | ICD-10-CM | POA: Diagnosis present

## 2014-01-11 DIAGNOSIS — J95821 Acute postprocedural respiratory failure: Secondary | ICD-10-CM | POA: Diagnosis not present

## 2014-01-11 DIAGNOSIS — E039 Hypothyroidism, unspecified: Secondary | ICD-10-CM | POA: Diagnosis present

## 2014-01-11 DIAGNOSIS — K831 Obstruction of bile duct: Secondary | ICD-10-CM | POA: Diagnosis present

## 2014-01-11 DIAGNOSIS — Z853 Personal history of malignant neoplasm of breast: Secondary | ICD-10-CM

## 2014-01-11 DIAGNOSIS — E43 Unspecified severe protein-calorie malnutrition: Secondary | ICD-10-CM | POA: Diagnosis present

## 2014-01-11 DIAGNOSIS — K838 Other specified diseases of biliary tract: Secondary | ICD-10-CM

## 2014-01-11 DIAGNOSIS — Z800 Family history of malignant neoplasm of digestive organs: Secondary | ICD-10-CM

## 2014-01-11 DIAGNOSIS — J9690 Respiratory failure, unspecified, unspecified whether with hypoxia or hypercapnia: Secondary | ICD-10-CM

## 2014-01-11 DIAGNOSIS — K219 Gastro-esophageal reflux disease without esophagitis: Secondary | ICD-10-CM | POA: Diagnosis present

## 2014-01-11 DIAGNOSIS — G894 Chronic pain syndrome: Secondary | ICD-10-CM | POA: Diagnosis present

## 2014-01-11 DIAGNOSIS — Z951 Presence of aortocoronary bypass graft: Secondary | ICD-10-CM

## 2014-01-11 DIAGNOSIS — N1330 Unspecified hydronephrosis: Secondary | ICD-10-CM | POA: Diagnosis present

## 2014-01-11 DIAGNOSIS — E8770 Fluid overload, unspecified: Secondary | ICD-10-CM | POA: Diagnosis not present

## 2014-01-11 DIAGNOSIS — C801 Malignant (primary) neoplasm, unspecified: Secondary | ICD-10-CM | POA: Diagnosis present

## 2014-01-11 DIAGNOSIS — F329 Major depressive disorder, single episode, unspecified: Secondary | ICD-10-CM | POA: Diagnosis present

## 2014-01-11 DIAGNOSIS — R918 Other nonspecific abnormal finding of lung field: Secondary | ICD-10-CM | POA: Diagnosis present

## 2014-01-11 DIAGNOSIS — C569 Malignant neoplasm of unspecified ovary: Secondary | ICD-10-CM | POA: Diagnosis present

## 2014-01-11 DIAGNOSIS — G92 Toxic encephalopathy: Secondary | ICD-10-CM | POA: Diagnosis present

## 2014-01-11 DIAGNOSIS — R945 Abnormal results of liver function studies: Secondary | ICD-10-CM

## 2014-01-11 DIAGNOSIS — Z9221 Personal history of antineoplastic chemotherapy: Secondary | ICD-10-CM

## 2014-01-11 DIAGNOSIS — R509 Fever, unspecified: Secondary | ICD-10-CM

## 2014-01-11 DIAGNOSIS — C786 Secondary malignant neoplasm of retroperitoneum and peritoneum: Secondary | ICD-10-CM | POA: Diagnosis present

## 2014-01-11 DIAGNOSIS — I252 Old myocardial infarction: Secondary | ICD-10-CM | POA: Diagnosis not present

## 2014-01-11 DIAGNOSIS — K830 Cholangitis: Principal | ICD-10-CM | POA: Diagnosis present

## 2014-01-11 DIAGNOSIS — Z87891 Personal history of nicotine dependence: Secondary | ICD-10-CM | POA: Diagnosis not present

## 2014-01-11 DIAGNOSIS — F419 Anxiety disorder, unspecified: Secondary | ICD-10-CM | POA: Diagnosis present

## 2014-01-11 DIAGNOSIS — I2510 Atherosclerotic heart disease of native coronary artery without angina pectoris: Secondary | ICD-10-CM | POA: Diagnosis present

## 2014-01-11 DIAGNOSIS — D72819 Decreased white blood cell count, unspecified: Secondary | ICD-10-CM | POA: Diagnosis present

## 2014-01-11 DIAGNOSIS — R112 Nausea with vomiting, unspecified: Secondary | ICD-10-CM | POA: Diagnosis present

## 2014-01-11 DIAGNOSIS — K8309 Other cholangitis: Secondary | ICD-10-CM | POA: Diagnosis present

## 2014-01-11 DIAGNOSIS — J9601 Acute respiratory failure with hypoxia: Secondary | ICD-10-CM

## 2014-01-11 DIAGNOSIS — R531 Weakness: Secondary | ICD-10-CM | POA: Diagnosis present

## 2014-01-11 DIAGNOSIS — J988 Other specified respiratory disorders: Secondary | ICD-10-CM

## 2014-01-11 LAB — COMPREHENSIVE METABOLIC PANEL
ALBUMIN: 2.2 g/dL — AB (ref 3.5–5.2)
ALT: 79 U/L — ABNORMAL HIGH (ref 0–35)
ANION GAP: 13 (ref 5–15)
AST: 57 U/L — AB (ref 0–37)
Alkaline Phosphatase: 409 U/L — ABNORMAL HIGH (ref 39–117)
BUN: 17 mg/dL (ref 6–23)
CALCIUM: 8.8 mg/dL (ref 8.4–10.5)
CO2: 25 mEq/L (ref 19–32)
Chloride: 92 mEq/L — ABNORMAL LOW (ref 96–112)
Creatinine, Ser: 0.97 mg/dL (ref 0.50–1.10)
GFR calc Af Amer: 68 mL/min — ABNORMAL LOW (ref >90)
GFR calc non Af Amer: 59 mL/min — ABNORMAL LOW (ref >90)
Glucose, Bld: 119 mg/dL — ABNORMAL HIGH (ref 70–99)
Potassium: 3.9 mEq/L (ref 3.7–5.3)
Sodium: 130 mEq/L — ABNORMAL LOW (ref 137–147)
TOTAL PROTEIN: 6.4 g/dL (ref 6.0–8.3)
Total Bilirubin: 5.7 mg/dL — ABNORMAL HIGH (ref 0.3–1.2)

## 2014-01-11 LAB — URINALYSIS, ROUTINE W REFLEX MICROSCOPIC
Glucose, UA: NEGATIVE mg/dL
Leukocytes, UA: NEGATIVE
Nitrite: NEGATIVE
Protein, ur: 30 mg/dL — AB
Specific Gravity, Urine: 1.02 (ref 1.005–1.030)
Urobilinogen, UA: 1 mg/dL (ref 0.0–1.0)
pH: 6.5 (ref 5.0–8.0)

## 2014-01-11 LAB — CBC
HEMATOCRIT: 27.4 % — AB (ref 36.0–46.0)
Hemoglobin: 9 g/dL — ABNORMAL LOW (ref 12.0–15.0)
MCH: 30.1 pg (ref 26.0–34.0)
MCHC: 32.8 g/dL (ref 30.0–36.0)
MCV: 91.6 fL (ref 78.0–100.0)
PLATELETS: 169 10*3/uL (ref 150–400)
RBC: 2.99 MIL/uL — ABNORMAL LOW (ref 3.87–5.11)
RDW: 18.5 % — ABNORMAL HIGH (ref 11.5–15.5)
WBC: 4.4 10*3/uL (ref 4.0–10.5)

## 2014-01-11 LAB — ACETAMINOPHEN LEVEL

## 2014-01-11 LAB — URINE MICROSCOPIC-ADD ON

## 2014-01-11 LAB — LIPASE, BLOOD: LIPASE: 53 U/L (ref 11–59)

## 2014-01-11 MED ORDER — ISOSORBIDE MONONITRATE ER 60 MG PO TB24
60.0000 mg | ORAL_TABLET | Freq: Every day | ORAL | Status: DC
  Administered 2014-01-11 – 2014-01-13 (×3): 60 mg via ORAL
  Administered 2014-01-14: 30 mg via ORAL
  Administered 2014-01-16 – 2014-01-17 (×2): 60 mg via ORAL
  Filled 2014-01-11 (×6): qty 1

## 2014-01-11 MED ORDER — ASPIRIN 81 MG PO CHEW
81.0000 mg | CHEWABLE_TABLET | Freq: Every day | ORAL | Status: DC
  Administered 2014-01-11 – 2014-01-17 (×6): 81 mg via ORAL
  Filled 2014-01-11 (×6): qty 1

## 2014-01-11 MED ORDER — HYDROMORPHONE HCL 1 MG/ML IJ SOLN
0.5000 mg | INTRAMUSCULAR | Status: DC | PRN
  Administered 2014-01-11 – 2014-01-12 (×5): 0.5 mg via INTRAVENOUS
  Filled 2014-01-11 (×4): qty 1

## 2014-01-11 MED ORDER — ONDANSETRON HCL 4 MG PO TABS: 4.0000 mg | ORAL_TABLET | Freq: Four times a day (QID) | ORAL | Status: DC | PRN

## 2014-01-11 MED ORDER — DIPHENHYDRAMINE HCL (SLEEP) 25 MG PO TABS: 25.0000 mg | ORAL_TABLET | Freq: Every evening | ORAL | Status: DC | PRN

## 2014-01-11 MED ORDER — ALPRAZOLAM 0.25 MG PO TABS
0.2500 mg | ORAL_TABLET | Freq: Four times a day (QID) | ORAL | Status: DC | PRN
  Administered 2014-01-13: 0.25 mg via ORAL
  Filled 2014-01-11: qty 1

## 2014-01-11 MED ORDER — DULOXETINE HCL 30 MG PO CPEP
30.0000 mg | ORAL_CAPSULE | Freq: Every day | ORAL | Status: DC
  Administered 2014-01-11 – 2014-01-17 (×6): 30 mg via ORAL
  Filled 2014-01-11 (×9): qty 1

## 2014-01-11 MED ORDER — ACETAMINOPHEN 650 MG RE SUPP: 650.0000 mg | Freq: Four times a day (QID) | RECTAL | Status: DC | PRN

## 2014-01-11 MED ORDER — PREDNISONE 10 MG PO TABS
5.0000 mg | ORAL_TABLET | Freq: Every day | ORAL | Status: DC
  Administered 2014-01-12 – 2014-01-17 (×5): 5 mg via ORAL
  Filled 2014-01-11 (×5): qty 1

## 2014-01-11 MED ORDER — BOOST / RESOURCE BREEZE PO LIQD
1.0000 | Freq: Three times a day (TID) | ORAL | Status: DC
  Administered 2014-01-11 – 2014-01-14 (×2): 1 via ORAL
  Administered 2014-01-16: 10:00:00 via ORAL

## 2014-01-11 MED ORDER — ZOLPIDEM TARTRATE 5 MG PO TABS
5.0000 mg | ORAL_TABLET | Freq: Every evening | ORAL | Status: DC | PRN
  Filled 2014-01-11: qty 1

## 2014-01-11 MED ORDER — METOCLOPRAMIDE HCL 10 MG PO TABS
5.0000 mg | ORAL_TABLET | Freq: Three times a day (TID) | ORAL | Status: DC
  Administered 2014-01-11 – 2014-01-17 (×20): 5 mg via ORAL
  Filled 2014-01-11 (×20): qty 1

## 2014-01-11 MED ORDER — SODIUM CHLORIDE 0.9 % IV SOLN
INTRAVENOUS | Status: DC
  Administered 2014-01-11 – 2014-01-15 (×7): via INTRAVENOUS

## 2014-01-11 MED ORDER — SODIUM CHLORIDE 0.9 % IV BOLUS (SEPSIS)
500.0000 mL | Freq: Once | INTRAVENOUS | Status: AC
  Administered 2014-01-11: 500 mL via INTRAVENOUS

## 2014-01-11 MED ORDER — HYDROXYZINE HCL 10 MG PO TABS
10.0000 mg | ORAL_TABLET | Freq: Three times a day (TID) | ORAL | Status: DC | PRN
  Filled 2014-01-11: qty 1

## 2014-01-11 MED ORDER — ENOXAPARIN SODIUM 40 MG/0.4ML ~~LOC~~ SOLN
40.0000 mg | SUBCUTANEOUS | Status: DC
  Administered 2014-01-11 – 2014-01-13 (×3): 40 mg via SUBCUTANEOUS
  Filled 2014-01-11 (×3): qty 0.4

## 2014-01-11 MED ORDER — CYCLOBENZAPRINE HCL 10 MG PO TABS
10.0000 mg | ORAL_TABLET | Freq: Three times a day (TID) | ORAL | Status: DC | PRN
  Administered 2014-01-11 – 2014-01-14 (×5): 10 mg via ORAL
  Filled 2014-01-11 (×6): qty 1

## 2014-01-11 MED ORDER — ONDANSETRON HCL 4 MG/2ML IJ SOLN
4.0000 mg | Freq: Four times a day (QID) | INTRAMUSCULAR | Status: DC | PRN
  Administered 2014-01-12 – 2014-01-14 (×5): 4 mg via INTRAVENOUS
  Filled 2014-01-11 (×5): qty 2

## 2014-01-11 MED ORDER — NITROGLYCERIN 0.4 MG SL SUBL: 0.4000 mg | SUBLINGUAL_TABLET | SUBLINGUAL | Status: DC | PRN

## 2014-01-11 MED ORDER — ACETAMINOPHEN 325 MG PO TABS
650.0000 mg | ORAL_TABLET | Freq: Four times a day (QID) | ORAL | Status: DC | PRN
  Administered 2014-01-11 – 2014-01-14 (×5): 650 mg via ORAL
  Filled 2014-01-11 (×6): qty 2

## 2014-01-11 NOTE — H&P (Signed)
[facility] History and Physical  Erika Moreno XFG:[id number] DOB: 1946/01/08 DOA: 01/11/2014  Referring physician: Elder Cyphers ED physician PCP: [name], MD   Chief Complaint: generalized weakness  HPI: Erika Moreno is a 68 y.o. female with history of stage IV cancer of unknown primary, with peritoneal carcinomatosis who is followed at [facility]. She presents to [facility] emergency room after being increasingly lethargic, generally weak and having poor by mouth intake for several days. Patient reports that she was recently started on a fentanyl patch for chronic pain management and since that time she was increasingly lethargic. She also admits to having nausea and vomiting over the past several days. On further questioning, she reports that the nausea may been present on and off for several weeks now. Family reports on arrival to the [facility] emergency room, she was noted to have a fever of 103. She denies any shortness of breath, cough, chest pain. She's had loose stools which are light in color the past several days. She's also had dark-colored urine. Basic labs indicate that she had an elevated bilirubin count. Family requested transfer to [facility] since she receives her cancer care here.   Review of Systems:  Pertinent positives as per HPI, otherwise negative  Past Medical History  Diagnosis Date  . 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 Cervical cancer 1984  . Myocardial infarction 20 yrs ago  . Coronary artery disease   . Anxiety   . Chronic back pain   . Depression   . GERD (gastroesophageal reflux disease)   . Hypertension   . Hypothyroidism   . Anemia   . Vitamin B 12 deficiency   . Vitamin D deficiency disease    Past Surgical History  Procedure Laterality Date  . Heart bypass      triple bypass with stents  . Coronary artery bypass graft      3 vessels  . Cardiac catheterization        stents x3  . Abdominal hysterectomy    . Portacath placement Right 12/06/2012    Procedure: INSERTION PORT-A-CATH;  Surgeon: Donato Heinz, MD;  Location: [facility];  Service: General;  Laterality: Right;   Social History:  reports that she quit smoking about 25 years ago. Her smoking use included Cigarettes. She has a 44 pack-year smoking history. She has never used smokeless tobacco. She reports that she does not drink alcohol or use illicit drugs.  Allergies  Allergen Reactions  . Avastin [Bevacizumab] Hives and Other (See Comments)    Took Prednisone along with Avastin to decrease hives  . Morphine And Related Other (See Comments)    Hallucinates with large IV doses.  . Reglan [Metoclopramide] Other (See Comments)    Hands shaking  . Sulfa Antibiotics Itching    Family History  Problem Relation Age of Onset  . Heart attack Mother   . Heart attack Father      Prior to Admission medications   Medication Sig Start Date End Date Taking? Authorizing Provider  ALPRAZolam (XANAX) 0.25 MG tablet Take 1 tablet (0.25 mg total) by mouth every 6 (six) hours as needed for anxiety. 11/26/13  Yes Baird Cancer, PA-C  aspirin 81 MG tablet Take 81 mg by mouth daily.   Yes Historical Provider, MD  clopidogrel (PLAVIX) 75 MG tablet Take 75 mg by mouth daily.   Yes Historical Provider, MD  cyclobenzaprine (FLEXERIL) 10 MG tablet Take  10 mg by mouth 3 (three) times daily as needed for muscle spasms.   Yes Historical Provider, MD  diphenhydrAMINE (NIGHT TIME SLEEP AID) 25 MG tablet Take 25 mg by mouth at bedtime as needed for sleep.   Yes Historical Provider, MD  DULoxetine (CYMBALTA) 30 MG capsule Take 1 capsule (30 mg total) by mouth daily. 07/30/13  Yes Farrel Gobble, MD  furosemide (LASIX) 20 MG tablet Take 20 mg by mouth daily.    Yes Historical Provider, MD  hydrOXYzine (ATARAX/VISTARIL) 10 MG tablet Take 10 mg by mouth every 8 (eight) hours as needed (pruitus).    Yes Historical Provider,  MD  isosorbide mononitrate (IMDUR) 60 MG 24 hr tablet Take 60 mg by mouth daily.    Yes Historical Provider, MD  lisinopril-hydrochlorothiazide (PRINZIDE,ZESTORETIC) 10-12.5 MG per tablet Take 1 tablet by mouth daily.    Yes Historical Provider, MD  magnesium hydroxide (MILK OF MAGNESIA) 400 MG/5ML suspension Take 30 mLs by mouth daily as needed for mild constipation.   Yes Historical Provider, MD  metoCLOPramide (REGLAN) 5 MG tablet Take 5 mg 3-4 times a day before meals and at bedtime to help with abdominal bloating. 01/02/14  Yes Farrel Gobble, MD  Multiple Vitamins-Minerals (HAIR/SKIN/NAILS PO) Take 1 tablet by mouth daily.   Yes Historical Provider, MD  nitroGLYCERIN (NITROSTAT) 0.6 MG SL tablet Place 0.4 mg under the tongue every 5 (five) minutes as needed for chest pain.    Yes Historical Provider, MD  ondansetron (ZOFRAN) 8 MG tablet Take 1 tablet by mouth every 8 hours as needed for nausea and vomiting 12/13/13  Yes Baird Cancer, PA-C  Oxycodone HCl 10 MG TABS Take 1 or 2 tablets every 2-4 hours as needed for pain. 01/02/14  Yes Farrel Gobble, MD  potassium chloride SA (K-DUR,KLOR-CON) 20 MEQ tablet Take 1 tablet (20 mEq total) by mouth 2 (two) times daily. 12/26/13  Yes Farrel Gobble, MD  predniSONE (DELTASONE) 5 MG tablet Take 5 mg by mouth daily with breakfast.  07/09/13  Yes Farrel Gobble, MD  prochlorperazine (COMPAZINE) 10 MG tablet Take 1 tablet (10 mg total) by mouth every 6 (six) hours as needed for nausea or vomiting. 12/19/13  Yes [name] Kefalas, PA-C  senna-docusate (SENOKOT-S) 8.6-50 MG per tablet Take 3 tablets by mouth at bedtime.   Yes Historical Provider, MD  everolimus (AFINITOR) 5 MG tablet Take 1 tablet (5 mg total) by mouth daily. 01/02/14   Farrel Gobble, MD  fentaNYL (DURAGESIC - DOSED MCG/HR) 25 MCG/HR patch Place 1 patch (25 mcg total) onto the skin every 3 (three) days. 01/08/14   Baird Cancer, PA-C   Physical Exam: Filed Vitals:   01/11/14 1352    BP: 118/67  Pulse: 82  Temp: 98.4 F (36.9 C)  TempSrc: Oral  Resp: 16  Height: 5\' 4"  (1.626 m)  Weight: 65.3 kg (143 lb 15.4 oz)  SpO2: 94%    Wt Readings from Last 3 Encounters:  01/11/14 65.3 kg (143 lb 15.4 oz)  01/02/14 63.866 kg (140 lb 12.8 oz)  12/19/13 63.776 kg (140 lb 9.6 oz)    General:  Appears calm and comfortable  Eyes: PERRL, normal lids, irises & conjunctiva ENT: grossly normal hearing, lips & tongue Neck: no LAD, masses or thyromegaly Cardiovascular: RRR, no m/r/g. No LE edema. Telemetry: SR, no arrhythmias  Respiratory: CTA bilaterally, no w/r/r. Normal respiratory effort. Abdomen: soft, diffusely tender, but more in the RUQ, bs+ Skin: jaundiced Musculoskeletal: grossly normal tone BUE/BLE  Psychiatric: grossly normal mood and affect, speech fluent and appropriate Neurologic: grossly non-focal.          Labs on Admission:  Basic Metabolic Panel:  Recent Labs Lab 01/11/14 1343  NA 130*  K 3.9  CL 92*  CO2 25  GLUCOSE 119*  BUN 17  CREATININE 0.97  CALCIUM 8.8   Liver Function Tests:  Recent Labs Lab 01/11/14 1343  AST 57*  ALT 79*  ALKPHOS 409*  BILITOT 5.7*  PROT 6.4  ALBUMIN 2.2*    Recent Labs Lab 01/11/14 1343  LIPASE 53   No results found for this basename: AMMONIA,  in the last 168 hours CBC:  Recent Labs Lab 01/11/14 1343  WBC 4.4  HGB 9.0*  HCT 27.4*  MCV 91.6  PLT 169   Cardiac Enzymes: No results found for this basename: CKTOTAL, CKMB, CKMBINDEX, TROPONINI,  in the last 168 hours  BNP (last 3 results)  Recent Labs  02/10/13 1655  PROBNP 193.2*   CBG: No results found for this basename: GLUCAP,  in the last 168 hours  Radiological Exams on Admission: US Abdomen Limited Ruq  01/11/2014   CLINICAL DATA:  Postprandial right upper quadrant abdominal pain over the past 2 weeks. Elevated liver function tests. Current history of ovarian cancer. Prior history of breast cancer.  EXAM: US ABDOMEN LIMITED -  RIGHT UPPER QUADRANT  COMPARISON:  CTA abdomen 11/19/2013, 06/12/2013.  FINDINGS: Gallbladder:  Markedly dilated, measuring approximately 13.9 x 4.1 x 4.3 cm. Large amount of layering echogenic sludge. No shadowing gallstones. No gallbladder wall thickening or pericholecystic fluid. Negative sonographic Murphy sign according to the ultrasound technologist.  Common bile duct:  Diameter: Approximately 14 mm.  Echogenic sludge within the common bile duct. No visible gallstones.  Liver:  Normal size and echotexture without focal parenchymal abnormality. Patent portal vein with hepatopetal flow. Minimal ascites adjacent to the liver.  IMPRESSION: 1. Dilated gallbladder containing a large amount of echogenic sludge. No evidence of cholelithiasis or acute cholecystitis. 2. Dilated common bile duct up to 14 mm, containing echogenic sludge. No visible common bile duct stones. 3. Minimal perihepatic ascites.   Electronically Signed   By: Evangeline Dakin M.D.   On: 01/11/2014 17:55    Assessment/Plan Active Problems:   Cancer of unknown origin   Elevated LFTs   Dehydration   Nausea and vomiting   Chronic pain syndrome   Benign essential HTN   1. Hyperbilirubinemia. Patient has mildly elevated AST/ALT, and a significantly elevated serum bilirubin. We'll fractionate bilirubin. Lipase is normal. Check right upper quadrant ultrasound since she does have pain in that area. If there are any gallbladder abnormalities, may need to involve surgery. At this time she appears to be stable. Monitor for any signs of infection. She does not have any significant leukocytosis she's been afebrile since her admission. 2. Nausea and vomiting, likely related to #1. Clear liquids for now and advance as tolerated 3. Dehydration. Provide gentle IV fluids. 4. Cancer of unknown origin. Plans are to followup with oncology. She is supposed to start a new chemotherapeutic agent today, but this will be held until her acute issues have  resolved. 5. History of coronary artery disease. No recent chest pain. She is on aspirin and Plavix. We'll continue aspirin but hold Plavix for now in anticipation that she needs any procedures 6. Chronic pain. Continue narcotics  Code Status: full code DVT Prophylaxis: Family Communication: discussed with patient and family at the bedside Disposition Plan: discharge  home once improved  Time spent: 10mins  Salisa Broz [facility] Pager [phone]

## 2014-01-11 NOTE — Progress Notes (Signed)
Pt came to floor from [facility] Emergency Department. Pt is alert and oriented, VS are stable. Family is at bedside and pt is in no acute distress, MD is aware of pt's arrival. Will continue to monitor.

## 2014-01-11 NOTE — Progress Notes (Addendum)
INITIAL NUTRITION ASSESSMENT  DOCUMENTATION CODES Per approved criteria  -Not Applicable   INTERVENTION: Resource Breeze po TID, each supplement provides 250 kcal and 9 grams of protein  NUTRITION DIAGNOSIS: Predicted sub optimal nutrient intake related to increased nutrition needs due to cancer dx as evidenced by n/v, hx of wt loss.   Goal: Pt will meet >90% of estimated nutritional needs  Monitor:  PO/supplement intake, labs, weight changes, I/O's  Reason for Assessment: MST=3  68 y.o. female  Admitting Dx: <principal problem not specified>  ASSESSMENT: Pt with hx of stage IV ovarian cancer, currently receiving chemotherapy at [facility].  She was transferred to [facility] from [facility] ED. Pt has been experiencing n/v. Currently receiving IV fluids.  MD in with pt and multiple family members at time of visit. Unable to complete nutrition focused physical exam at this time.  Wt hx reveals modest wt loss over the past year, however, no significant for time period. Suspect inadequate oral intake PTA due to n/v and increased nutritional needs due to cancer treatment. Pt would likely benefit from supplement. Will order [employer], due to pt being on a clear liquid diet currently.  Labs reviewed. Na: 130. Glucose: 119.   Height: Ht Readings from Last 1 Encounters:  01/11/14 5\' 4"  (1.626 m)    Weight: Wt Readings from Last 1 Encounters:  01/11/14 143 lb 15.4 oz (65.3 kg)    Ideal Body Weight: 120#  % Ideal Body Weight: 119%  Wt Readings from Last 50 Encounters:  01/11/14 143 lb 15.4 oz (65.3 kg)  01/02/14 140 lb 12.8 oz (63.866 kg)  12/19/13 140 lb 9.6 oz (63.776 kg)  12/12/13 141 lb (63.957 kg)  11/26/13 141 lb 8 oz (64.184 kg)  11/05/13 140 lb 4.8 oz (63.64 kg)  10/08/13 138 lb 9.6 oz (62.869 kg)  09/17/13 141 lb 6.4 oz (64.139 kg)  09/10/13 140 lb 9.6 oz (63.776 kg)  08/31/13 143 lb 1.6 oz (64.91 kg)  08/20/13 142 lb (64.411 kg)  07/30/13 144 lb 1.6 oz (65.363 kg)   06/18/13 141 lb 6.4 oz (64.139 kg)  05/28/13 144 lb 12.8 oz (65.681 kg)  05/07/13 146 lb 3.2 oz (66.316 kg)  04/04/13 155 lb 3.2 oz (70.398 kg)  03/27/13 152 lb 9.6 oz (69.219 kg)  03/13/13 158 lb 6.4 oz (71.85 kg)  02/21/13 162 lb (73.483 kg)  02/14/13 165 lb (74.844 kg)  02/10/13 160 lb (72.576 kg)  01/24/13 161 lb (73.029 kg)  01/03/13 155 lb 3.2 oz (70.398 kg)  12/12/12 152 lb 6.4 oz (69.128 kg)  11/30/12 147 lb (66.679 kg)  11/22/12 146 lb 9.6 oz (66.497 kg)  11/21/12 149 lb 9.6 oz (67.858 kg)  11/15/12 146 lb 9.6 oz (66.497 kg)   Usual Body Weight: 155#  % Usual Body Weight: 92%  BMI:  Body mass index is 24.7 kg/(m^2). Normal weight range  Estimated Nutritional Needs: Kcal: 0211-1552 Protein: 85-95 grams  Fluid: 2.0-2.3 L  Skin: rt and left leg abrasions  Diet Order: Clear Liquid  EDUCATION NEEDS: -Education not appropriate at this time   Intake/Output Summary (Last 24 hours) at 01/11/14 1535 Last data filed at 01/11/14 1447  Gross per 24 hour  Intake 103.33 ml  Output    375 ml  Net -271.67 ml    Last BM: 01/10/14  Labs:   Recent Labs Lab 01/11/14 1343  NA 130*  K 3.9  CL 92*  CO2 25  BUN 17  CREATININE 0.97  CALCIUM 8.8  GLUCOSE 119*    CBG (last 3)  No results found for this basename: GLUCAP,  in the last 72 hours  Scheduled Meds:   Continuous Infusions: . sodium chloride 100 mL/hr at 01/11/14 1345    Past Medical History  Diagnosis Date  . 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 Cervical cancer 1984  . Myocardial infarction 20 yrs ago  . Coronary artery disease   . Anxiety   . Chronic back pain   . Depression   . GERD (gastroesophageal reflux disease)   . Hypertension   . Hypothyroidism   . Anemia   . Vitamin B 12 deficiency   . Vitamin D deficiency disease     Past Surgical History  Procedure Laterality Date  . Heart bypass      triple bypass with stents  . Coronary artery bypass  graft      3 vessels  . Cardiac catheterization      stents x3  . Abdominal hysterectomy    . Portacath placement Right 12/06/2012    Procedure: INSERTION PORT-A-CATH;  Surgeon: Donato Heinz, MD;  Location: [facility];  Service: General;  Laterality: Right;    Belvie Iribe A. Jimmye Norman, RD, LDN Pager: [phone]

## 2014-01-12 DIAGNOSIS — E43 Unspecified severe protein-calorie malnutrition: Secondary | ICD-10-CM | POA: Diagnosis present

## 2014-01-12 LAB — CBC
HCT: 25.7 % — ABNORMAL LOW (ref 36.0–46.0)
Hemoglobin: 8 g/dL — ABNORMAL LOW (ref 12.0–15.0)
MCH: 29.4 pg (ref 26.0–34.0)
MCHC: 31.1 g/dL (ref 30.0–36.0)
MCV: 94.5 fL (ref 78.0–100.0)
PLATELETS: 145 10*3/uL — AB (ref 150–400)
RBC: 2.72 MIL/uL — ABNORMAL LOW (ref 3.87–5.11)
RDW: 18 % — AB (ref 11.5–15.5)
WBC: 2.8 10*3/uL — ABNORMAL LOW (ref 4.0–10.5)

## 2014-01-12 LAB — HEPATIC FUNCTION PANEL
ALT: 63 U/L — ABNORMAL HIGH (ref 0–35)
AST: 48 U/L — ABNORMAL HIGH (ref 0–37)
Albumin: 1.9 g/dL — ABNORMAL LOW (ref 3.5–5.2)
Alkaline Phosphatase: 392 U/L — ABNORMAL HIGH (ref 39–117)
BILIRUBIN INDIRECT: 0.8 mg/dL (ref 0.3–0.9)
Bilirubin, Direct: 3.4 mg/dL — ABNORMAL HIGH (ref 0.0–0.3)
Total Bilirubin: 4.2 mg/dL — ABNORMAL HIGH (ref 0.3–1.2)
Total Protein: 5.8 g/dL — ABNORMAL LOW (ref 6.0–8.3)

## 2014-01-12 LAB — BASIC METABOLIC PANEL
ANION GAP: 10 (ref 5–15)
BUN: 16 mg/dL (ref 6–23)
CALCIUM: 8.5 mg/dL (ref 8.4–10.5)
CO2: 25 mEq/L (ref 19–32)
Chloride: 97 mEq/L (ref 96–112)
Creatinine, Ser: 0.92 mg/dL (ref 0.50–1.10)
GFR calc non Af Amer: 63 mL/min — ABNORMAL LOW (ref >90)
GFR, EST AFRICAN AMERICAN: 72 mL/min — AB (ref >90)
Glucose, Bld: 95 mg/dL (ref 70–99)
Potassium: 4 mEq/L (ref 3.7–5.3)
Sodium: 132 mEq/L — ABNORMAL LOW (ref 137–147)

## 2014-01-12 LAB — PROTIME-INR
INR: 1.3 (ref 0.00–1.49)
Prothrombin Time: 16.2 seconds — ABNORMAL HIGH (ref 11.6–15.2)

## 2014-01-12 MED ORDER — PIPERACILLIN-TAZOBACTAM 3.375 G IVPB
3.3750 g | Freq: Three times a day (TID) | INTRAVENOUS | Status: DC
  Administered 2014-01-12 – 2014-01-17 (×16): 3.375 g via INTRAVENOUS
  Filled 2014-01-12 (×19): qty 50

## 2014-01-12 MED ORDER — HYDROMORPHONE HCL 1 MG/ML IJ SOLN
1.0000 mg | INTRAMUSCULAR | Status: DC | PRN
  Administered 2014-01-12 – 2014-01-13 (×4): 1 mg via INTRAVENOUS
  Filled 2014-01-12 (×6): qty 1

## 2014-01-12 NOTE — Progress Notes (Signed)
Noted some redness, tenderness, warmth to bilateral lower extremities.  Two open wounds to right anterior lower leg and one scabbed over wound to left lateral leg.  Dr. Roderic Palau text paged and made aware.  Will continue to monitor.

## 2014-01-12 NOTE — Consult Note (Signed)
Reason for Consult: Jaundice, biliary sludge Referring Physician: Hospitalist  Erika Moreno is an 68 y.o. female.  HPI: Patient is 68 year old white female with carcinomatosis of unknown etiology who was transferred from [facility] with dehydration and fever. She subsequently was found to have hyperbilirubinemia with elevated liver enzyme tests. This was fractionated and her direct bilirubin is significantly elevated. Ultrasound the gallbladder was done which revealed biliary sludge and a dilated hepatobiliary tree. She states she has had intermittent nausea and abdominal pain, though she is also had multiple areas of pain. Her last chemotherapy treatment was 3 weeks ago.  Past Medical History  Diagnosis Date  . 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 Cervical cancer 1984  . Myocardial infarction 20 yrs ago  . Coronary artery disease   . Anxiety   . Chronic back pain   . Depression   . GERD (gastroesophageal reflux disease)   . Hypertension   . Hypothyroidism   . Anemia   . Vitamin B 12 deficiency   . Vitamin D deficiency disease     Past Surgical History  Procedure Laterality Date  . Heart bypass      triple bypass with stents  . Coronary artery bypass graft      3 vessels  . Cardiac catheterization      stents x3  . Abdominal hysterectomy    . Portacath placement Right 12/06/2012    Procedure: INSERTION PORT-A-CATH;  Surgeon: Donato Heinz, MD;  Location: [facility];  Service: General;  Laterality: Right;    Family History  Problem Relation Age of Onset  . Heart attack Mother   . Heart attack Father     Social History:  reports that she quit smoking about 25 years ago. Her smoking use included Cigarettes. She has a 44 pack-year smoking history. She has never used smokeless tobacco. She reports that she does not drink alcohol or use illicit drugs.  Allergies:  Allergies  Allergen Reactions  . Avastin [Bevacizumab] Hives and  Other (See Comments)    Took Prednisone along with Avastin to decrease hives  . Morphine And Related Other (See Comments)    Hallucinates with large IV doses.  . Reglan [Metoclopramide] Other (See Comments)    Hands shaking  . Sulfa Antibiotics Itching    Medications: I have reviewed the patient's current medications.  Results for orders placed during the hospital encounter of 01/11/14 (from the past 48 hour(s))  CBC     Status: Abnormal   Collection Time    01/11/14  1:43 PM      Result Value Ref Range   WBC 4.4  4.0 - 10.5 K/uL   RBC 2.99 (*) 3.87 - 5.11 MIL/uL   Hemoglobin 9.0 (*) 12.0 - 15.0 g/dL   HCT 27.4 (*) 36.0 - 46.0 %   MCV 91.6  78.0 - 100.0 fL   MCH 30.1  26.0 - 34.0 pg   MCHC 32.8  30.0 - 36.0 g/dL   RDW 18.5 (*) 11.5 - 15.5 %   Platelets 169  150 - 400 K/uL  COMPREHENSIVE METABOLIC PANEL     Status: Abnormal   Collection Time    01/11/14  1:43 PM      Result Value Ref Range   Sodium 130 (*) 137 - 147 mEq/L   Potassium 3.9  3.7 - 5.3 mEq/L   Chloride 92 (*) 96 - 112 mEq/L   CO2 25  19 - 32  mEq/L   Glucose, Bld 119 (*) 70 - 99 mg/dL   BUN 17  6 - 23 mg/dL   Creatinine, Ser 0.97  0.50 - 1.10 mg/dL   Calcium 8.8  8.4 - 10.5 mg/dL   Total Protein 6.4  6.0 - 8.3 g/dL   Albumin 2.2 (*) 3.5 - 5.2 g/dL   AST 57 (*) 0 - 37 U/L   ALT 79 (*) 0 - 35 U/L   Alkaline Phosphatase 409 (*) 39 - 117 U/L   Total Bilirubin 5.7 (*) 0.3 - 1.2 mg/dL   GFR calc non Af Amer 59 (*) >90 mL/min   GFR calc Af Amer 68 (*) >90 mL/min   Comment: (NOTE)     The eGFR has been calculated using the CKD EPI equation.     This calculation has not been validated in all clinical situations.     eGFR's persistently <90 mL/min signify possible Chronic Kidney     Disease.   Anion gap 13  5 - 15  LIPASE, BLOOD     Status: None   Collection Time    01/11/14  1:43 PM      Result Value Ref Range   Lipase 53  11 - 59 U/L  ACETAMINOPHEN LEVEL     Status: None   Collection Time    01/11/14  1:43  PM      Result Value Ref Range   Acetaminophen (Tylenol), Serum <15.0  10 - 30 ug/mL   Comment:            THERAPEUTIC CONCENTRATIONS VARY     SIGNIFICANTLY. A RANGE OF 10-30     ug/mL MAY BE AN EFFECTIVE     CONCENTRATION FOR MANY PATIENTS.     HOWEVER, SOME ARE BEST TREATED     AT CONCENTRATIONS OUTSIDE THIS     RANGE.     ACETAMINOPHEN CONCENTRATIONS     >150 ug/mL AT 4 HOURS AFTER     INGESTION AND >50 ug/mL AT 12     HOURS AFTER INGESTION ARE     OFTEN ASSOCIATED WITH TOXIC     REACTIONS.  URINALYSIS, ROUTINE W REFLEX MICROSCOPIC     Status: Abnormal   Collection Time    01/11/14 10:30 PM      Result Value Ref Range   Color, Urine BROWN (*) YELLOW   Comment: BIOCHEMICALS MAY BE AFFECTED BY COLOR   APPearance CLEAR  CLEAR   Specific Gravity, Urine 1.020  1.005 - 1.030   pH 6.5  5.0 - 8.0   Glucose, UA NEGATIVE  NEGATIVE mg/dL   Hgb urine dipstick SMALL (*) NEGATIVE   Bilirubin Urine LARGE (*) NEGATIVE   Ketones, ur TRACE (*) NEGATIVE mg/dL   Protein, ur 30 (*) NEGATIVE mg/dL   Urobilinogen, UA 1.0  0.0 - 1.0 mg/dL   Nitrite NEGATIVE  NEGATIVE   Leukocytes, UA NEGATIVE  NEGATIVE  URINE MICROSCOPIC-ADD ON     Status: Abnormal   Collection Time    01/11/14 10:30 PM      Result Value Ref Range   Squamous Epithelial / LPF FEW (*) RARE   WBC, UA 0-2  <3 WBC/hpf   RBC / HPF 0-2  <3 RBC/hpf   Casts HYALINE CASTS (*) NEGATIVE  CULTURE, BLOOD (ROUTINE X 2)     Status: None   Collection Time    01/11/14 10:38 PM      Result Value Ref Range   Specimen Description BLOOD RIGHT WRIST  Special Requests BOTTLES DRAWN AEROBIC AND ANAEROBIC 8CC     Culture NO GROWTH 1 DAY     Report Status PENDING    CULTURE, BLOOD (ROUTINE X 2)     Status: None   Collection Time    01/11/14 10:42 PM      Result Value Ref Range   Specimen Description LEFT ANTECUBITAL     Special Requests BOTTLES DRAWN AEROBIC AND ANAEROBIC 6CC     Culture NO GROWTH 1 DAY     Report Status PENDING    CBC      Status: Abnormal   Collection Time    01/12/14  6:29 AM      Result Value Ref Range   WBC 2.8 (*) 4.0 - 10.5 K/uL   RBC 2.72 (*) 3.87 - 5.11 MIL/uL   Hemoglobin 8.0 (*) 12.0 - 15.0 g/dL   HCT 25.7 (*) 36.0 - 46.0 %   MCV 94.5  78.0 - 100.0 fL   MCH 29.4  26.0 - 34.0 pg   MCHC 31.1  30.0 - 36.0 g/dL   RDW 18.0 (*) 11.5 - 15.5 %   Platelets 145 (*) 150 - 400 K/uL  PROTIME-INR     Status: Abnormal   Collection Time    01/12/14  6:29 AM      Result Value Ref Range   Prothrombin Time 16.2 (*) 11.6 - 15.2 seconds   INR 1.30  0.00 - 1.88  BASIC METABOLIC PANEL     Status: Abnormal   Collection Time    01/12/14  6:29 AM      Result Value Ref Range   Sodium 132 (*) 137 - 147 mEq/L   Potassium 4.0  3.7 - 5.3 mEq/L   Chloride 97  96 - 112 mEq/L   CO2 25  19 - 32 mEq/L   Glucose, Bld 95  70 - 99 mg/dL   BUN 16  6 - 23 mg/dL   Creatinine, Ser 0.92  0.50 - 1.10 mg/dL   Calcium 8.5  8.4 - 10.5 mg/dL   GFR calc non Af Amer 63 (*) >90 mL/min   GFR calc Af Amer 72 (*) >90 mL/min   Comment: (NOTE)     The eGFR has been calculated using the CKD EPI equation.     This calculation has not been validated in all clinical situations.     eGFR's persistently <90 mL/min signify possible Chronic Kidney     Disease.   Anion gap 10  5 - 15  HEPATIC FUNCTION PANEL     Status: Abnormal   Collection Time    01/12/14  6:29 AM      Result Value Ref Range   Total Protein 5.8 (*) 6.0 - 8.3 g/dL   Albumin 1.9 (*) 3.5 - 5.2 g/dL   AST 48 (*) 0 - 37 U/L   ALT 63 (*) 0 - 35 U/L   Alkaline Phosphatase 392 (*) 39 - 117 U/L   Total Bilirubin 4.2 (*) 0.3 - 1.2 mg/dL   Bilirubin, Direct 3.4 (*) 0.0 - 0.3 mg/dL   Indirect Bilirubin 0.8  0.3 - 0.9 mg/dL    Dg Chest 2 View  01/12/2014   CLINICAL DATA:  Fever.  Confusion.  Initial evaluation.  EXAM: CHEST  2 VIEW  COMPARISON:  Chest CT 8/17 2015.  FINDINGS: Power port catheter tip noted projected over upper superior vena cava. Mediastinum unremarkable. Right  hilar fullness noted consistent with adenopathy. Right perihilar infiltrate noted. Subsegmental  atelectasis lung bases. Prior CABG. Cardiomegaly with normal pulmonary vascularity. No pleural effusion or pneumothorax. No acute osseus abnormality.  IMPRESSION: 1. Power port catheter with tip projected over superior vena cava. 2. Right hilar fullness suggesting adenopathy. Right perihilar infiltrate noted. 3. Prior CABG.  Cardiomegaly, no CHF.   Electronically Signed   By: Marcello Moores  Register   On: 01/12/2014 02:35   US Abdomen Limited Ruq  01/11/2014   CLINICAL DATA:  Postprandial right upper quadrant abdominal pain over the past 2 weeks. Elevated liver function tests. Current history of ovarian cancer. Prior history of breast cancer.  EXAM: US ABDOMEN LIMITED - RIGHT UPPER QUADRANT  COMPARISON:  CTA abdomen 11/19/2013, 06/12/2013.  FINDINGS: Gallbladder:  Markedly dilated, measuring approximately 13.9 x 4.1 x 4.3 cm. Large amount of layering echogenic sludge. No shadowing gallstones. No gallbladder wall thickening or pericholecystic fluid. Negative sonographic Murphy sign according to the ultrasound technologist.  Common bile duct:  Diameter: Approximately 14 mm.  Echogenic sludge within the common bile duct. No visible gallstones.  Liver:  Normal size and echotexture without focal parenchymal abnormality. Patent portal vein with hepatopetal flow. Minimal ascites adjacent to the liver.  IMPRESSION: 1. Dilated gallbladder containing a large amount of echogenic sludge. No evidence of cholelithiasis or acute cholecystitis. 2. Dilated common bile duct up to 14 mm, containing echogenic sludge. No visible common bile duct stones. 3. Minimal perihepatic ascites.   Electronically Signed   By: Evangeline Dakin M.D.   On: 01/11/2014 17:55    ROS: See chart Blood pressure 122/61, pulse 84, temperature 98.4 F (36.9 C), temperature source Oral, resp. rate 18, height _0  (1.626 m), weight 65.3 kg (143 lb 15.4 oz), SpO2  96.00%. Physical Exam: Pleasant white female who is in no acute distress. She is jaundiced. Abdomen is soft with some distention noted. No specific tenderness is noted to palpation. No rigidity is noted.  Assessment/Plan: Impression: Jaundice with direct bilirubin component, history of carcinomatosis of unknown etiology, though ovarian is favored. Plan: Would get MRCP to further delineate the hepatobiliary tree. Agree with continuing IV antibiotics. Further management is pending those results.  Ory Elting A 01/12/2014, 1:23 PM

## 2014-01-12 NOTE — Progress Notes (Signed)
ANTIBIOTIC CONSULT NOTE  Pharmacy Consult for Zosyn Indication: intra-abdominal infection  Allergies  Allergen Reactions  . Avastin [Bevacizumab] Hives and Other (See Comments)    Took Prednisone along with Avastin to decrease hives  . Morphine And Related Other (See Comments)    Hallucinates with large IV doses.  . Reglan [Metoclopramide] Other (See Comments)    Hands shaking  . Sulfa Antibiotics Itching    Patient Measurements: Height: 5\' 4"  (162.6 cm) Weight: 143 lb 15.4 oz (65.3 kg) IBW/kg (Calculated) : 54.7  Vital Signs: Temp: 100.4 F (38 C) (10/10 0720) Temp Source: Oral (10/10 0720) BP: 122/61 mmHg (10/10 0720) Pulse Rate: 84 (10/10 0720) Intake/Output from previous day: 10/09 0701 - 10/10 0700 In: 103.3 [I.V.:103.3] Out: 375 [Urine:375] Intake/Output from this shift:    Labs:  Recent Labs  01/11/14 1343 01/12/14 0629  WBC 4.4 2.8*  HGB 9.0* 8.0*  PLT 169 145*  CREATININE 0.97 0.92   Estimated Creatinine Clearance: 50.5 ml/min (by C-G formula based on Cr of 0.92). No results found for this basename: VANCOTROUGH, Corlis Leak, VANCORANDOM, GENTTROUGH, GENTPEAK, GENTRANDOM, TOBRATROUGH, TOBRAPEAK, TOBRARND, AMIKACINPEAK, AMIKACINTROU, AMIKACIN,  in the last 72 hours   Microbiology: Recent Results (from the past 720 hour(s))  CULTURE, BLOOD (ROUTINE X 2)     Status: None   Collection Time    01/11/14 10:38 PM      Result Value Ref Range Status   Specimen Description BLOOD RIGHT WRIST   Final   Special Requests BOTTLES DRAWN AEROBIC AND ANAEROBIC 8CC   Final   Culture NO GROWTH 1 DAY   Final   Report Status PENDING   Incomplete  CULTURE, BLOOD (ROUTINE X 2)     Status: None   Collection Time    01/11/14 10:42 PM      Result Value Ref Range Status   Specimen Description LEFT ANTECUBITAL   Final   Special Requests BOTTLES DRAWN AEROBIC AND ANAEROBIC 6CC   Final   Culture NO GROWTH 1 DAY   Final   Report Status PENDING   Incomplete    Anti-infectives    Start     Dose/Rate Route Frequency Ordered Stop   01/12/14 0900  piperacillin-tazobactam (ZOSYN) IVPB 3.375 g     3.375 g 12.5 mL/hr over 240 Minutes Intravenous Every 8 hours 01/12/14 0817        Assessment: 24 yoF with progressive primary peritoneal carcinomatosis with involvement of cervical, thoracic, and abdominal lymph nodes with slight progression of disease on most recent restaging CT imaging on 11/29/2013.  She was admitted with fever, neutropenia, dark colored urine, loose stools, and elevated bilirubin/LFTs.   Zosyn was started empirically for possible intra-abdominal infection.   Renal function is at patient's baseline.    Zosyn 10/10>>  Goal of Therapy:  Eradicate infection.  Plan:  Zosyn 3.375gm IV Q8h to be infused over 4hrs Monitor renal function and cx data   Jazilyn Siegenthaler, Lavonia Drafts 01/12/2014,10:35 AM

## 2014-01-12 NOTE — Progress Notes (Signed)
[facility] PROGRESS NOTE  Erika Moreno SVX:[id number] DOB: [birth year] DOA: 01/11/2014 PCP: [name], MD  Assessment/Plan: 1. Jaundice, possibly related to acalculous cholecystitis. Appreciate general surgery input. Patient does have elevated bilirubin, abdominal tenderness and nausea and vomiting. Ultrasound of the abdomen shows dilated biliary tree an enlarged gallbladder. Plans are for further evaluation with MRCP in the morning. Her fevers are likely from her biliary process. We'll continue antibiotics for now. 2. Nausea and vomiting. Continue supportive treatment. Clear liquids for now. 3. Dehydration. Continue gentle IV hydration. 4. 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 Plans are to followup with oncology. Hold any chemotherapeutic agents until seen by oncology. 5. History of coronary artery disease. No recent chest pain. She is on aspirin and Plavix. Plavix was held on admission in anticipation of any possible procedures. 6. Chronic pain. Continue narcotics.  Code Status: full code Family Communication: discussed with patient and multiple family members at the bedside Disposition Plan: discharge home once improved   Consultants:  General surgery  Procedures:    Antibiotics:  Zosyn 01/12/14  HPI/Subjective: Complaining of lower abdominal pain, no vomiting, still feels nauseous  Objective: Filed Vitals:   01/12/14 1524  BP: 130/71  Pulse: 72  Temp: 97.7 F (36.5 C)  Resp: 18    Intake/Output Summary (Last 24 hours) at 01/12/14 1849 Last data filed at 01/12/14 1800  Gross per 24 hour  Intake 3061.67 ml  Output      0 ml  Net 3061.67 ml   Filed Weights   01/11/14 1352  Weight: 65.3 kg (143 lb 15.4 oz)    Exam:   General:  NAD  Cardiovascular: s1, s2, rrr  Respiratory: CTA B  Abdomen: tenderness, more in the upper abdomen, soft, bs+  Musculoskeletal: no edema b/l   Data Reviewed: Basic Metabolic Panel:  Recent  Labs Lab 01/11/14 1343 01/12/14 0629  NA 130* 132*  K 3.9 4.0  CL 92* 97  CO2 25 25  GLUCOSE 119* 95  BUN 17 16  CREATININE 0.97 0.92  CALCIUM 8.8 8.5   Liver Function Tests:  Recent Labs Lab 01/11/14 1343 01/12/14 0629  AST 57* 48*  ALT 79* 63*  ALKPHOS 409* 392*  BILITOT 5.7* 4.2*  PROT 6.4 5.8*  ALBUMIN 2.2* 1.9*    Recent Labs Lab 01/11/14 1343  LIPASE 53   No results found for this basename: AMMONIA,  in the last 168 hours CBC:  Recent Labs Lab 01/11/14 1343 01/12/14 0629  WBC 4.4 2.8*  HGB 9.0* 8.0*  HCT 27.4* 25.7*  MCV 91.6 94.5  PLT 169 145*   Cardiac Enzymes: No results found for this basename: CKTOTAL, CKMB, CKMBINDEX, TROPONINI,  in the last 168 hours BNP (last 3 results)  Recent Labs  02/10/13 1655  PROBNP 193.2*   CBG: No results found for this basename: GLUCAP,  in the last 168 hours  Recent Results (from the past 240 hour(s))  CULTURE, BLOOD (ROUTINE X 2)     Status: None   Collection Time    01/11/14 10:38 PM      Result Value Ref Range Status   Specimen Description BLOOD RIGHT WRIST   Final   Special Requests BOTTLES DRAWN AEROBIC AND ANAEROBIC 8CC   Final   Culture NO GROWTH 1 DAY   Final   Report Status PENDING   Incomplete  CULTURE, BLOOD (ROUTINE X 2)     Status: None   Collection Time    01/11/14 10:42 PM  Result Value Ref Range Status   Specimen Description LEFT ANTECUBITAL   Final   Special Requests BOTTLES DRAWN AEROBIC AND ANAEROBIC 6CC   Final   Culture NO GROWTH 1 DAY   Final   Report Status PENDING   Incomplete     Studies: Dg Chest 2 View  01/12/2014   CLINICAL DATA:  Fever.  Confusion.  Initial evaluation.  EXAM: CHEST  2 VIEW  COMPARISON:  Chest CT 8/17 2015.  FINDINGS: Power port catheter tip noted projected over upper superior vena cava. Mediastinum unremarkable. Right hilar fullness noted consistent with adenopathy. Right perihilar infiltrate noted. Subsegmental atelectasis lung bases. Prior CABG.  Cardiomegaly with normal pulmonary vascularity. No pleural effusion or pneumothorax. No acute osseus abnormality.  IMPRESSION: 1. Power port catheter with tip projected over superior vena cava. 2. Right hilar fullness suggesting adenopathy. Right perihilar infiltrate noted. 3. Prior CABG.  Cardiomegaly, no CHF.   Electronically Signed   By: Marcello Moores  Register   On: 01/12/2014 02:35   US Abdomen Limited Ruq  01/11/2014   CLINICAL DATA:  Postprandial right upper quadrant abdominal pain over the past 2 weeks. Elevated liver function tests. Current history of ovarian cancer. Prior history of breast cancer.  EXAM: US ABDOMEN LIMITED - RIGHT UPPER QUADRANT  COMPARISON:  CTA abdomen 11/19/2013, 06/12/2013.  FINDINGS: Gallbladder:  Markedly dilated, measuring approximately 13.9 x 4.1 x 4.3 cm. Large amount of layering echogenic sludge. No shadowing gallstones. No gallbladder wall thickening or pericholecystic fluid. Negative sonographic Murphy sign according to the ultrasound technologist.  Common bile duct:  Diameter: Approximately 14 mm.  Echogenic sludge within the common bile duct. No visible gallstones.  Liver:  Normal size and echotexture without focal parenchymal abnormality. Patent portal vein with hepatopetal flow. Minimal ascites adjacent to the liver.  IMPRESSION: 1. Dilated gallbladder containing a large amount of echogenic sludge. No evidence of cholelithiasis or acute cholecystitis. 2. Dilated common bile duct up to 14 mm, containing echogenic sludge. No visible common bile duct stones. 3. Minimal perihepatic ascites.   Electronically Signed   By: Evangeline Dakin M.D.   On: 01/11/2014 17:55    Scheduled Meds: . aspirin  81 mg Oral Daily  . DULoxetine  30 mg Oral Daily  . enoxaparin (LOVENOX) injection  40 mg Subcutaneous Q24H  . feeding supplement (RESOURCE BREEZE)  1 Container Oral TID BM  . isosorbide mononitrate  60 mg Oral Daily  . metoCLOPramide  5 mg Oral TID AC & HS  .  piperacillin-tazobactam (ZOSYN)  IV  3.375 g Intravenous Q8H  . predniSONE  5 mg Oral Q breakfast   Continuous Infusions: . sodium chloride 100 mL/hr at 01/12/14 1013    Principal Problem:   Elevated LFTs Active Problems:   Cancer of unknown origin   Dehydration   Nausea and vomiting   Chronic pain syndrome   Benign essential HTN   Protein-calorie malnutrition, severe    Time spent: 57mins    Erika Moreno  [facility] Pager [phone]. If 7PM-7AM, please contact night-coverage at [email or web address], password [facility] 01/12/2014, 6:49 PM  LOS: 1 day

## 2014-01-13 ENCOUNTER — Inpatient Hospital Stay (HOSPITAL_COMMUNITY): Payer: PRIVATE HEALTH INSURANCE

## 2014-01-13 DIAGNOSIS — E43 Unspecified severe protein-calorie malnutrition: Secondary | ICD-10-CM

## 2014-01-13 LAB — CBC
HEMATOCRIT: 26 % — AB (ref 36.0–46.0)
Hemoglobin: 8.4 g/dL — ABNORMAL LOW (ref 12.0–15.0)
MCH: 29.7 pg (ref 26.0–34.0)
MCHC: 32.3 g/dL (ref 30.0–36.0)
MCV: 91.9 fL (ref 78.0–100.0)
PLATELETS: 162 10*3/uL (ref 150–400)
RBC: 2.83 MIL/uL — ABNORMAL LOW (ref 3.87–5.11)
RDW: 18.2 % — AB (ref 11.5–15.5)
WBC: 2.9 10*3/uL — AB (ref 4.0–10.5)

## 2014-01-13 LAB — COMPREHENSIVE METABOLIC PANEL
ALBUMIN: 1.9 g/dL — AB (ref 3.5–5.2)
ALT: 67 U/L — ABNORMAL HIGH (ref 0–35)
ANION GAP: 12 (ref 5–15)
AST: 73 U/L — ABNORMAL HIGH (ref 0–37)
Alkaline Phosphatase: 494 U/L — ABNORMAL HIGH (ref 39–117)
BILIRUBIN TOTAL: 4.3 mg/dL — AB (ref 0.3–1.2)
BUN: 12 mg/dL (ref 6–23)
CHLORIDE: 99 meq/L (ref 96–112)
CO2: 24 mEq/L (ref 19–32)
CREATININE: 0.87 mg/dL (ref 0.50–1.10)
Calcium: 8.6 mg/dL (ref 8.4–10.5)
GFR, EST AFRICAN AMERICAN: 78 mL/min — AB (ref >90)
GFR, EST NON AFRICAN AMERICAN: 67 mL/min — AB (ref >90)
GLUCOSE: 106 mg/dL — AB (ref 70–99)
Potassium: 3.9 mEq/L (ref 3.7–5.3)
Sodium: 135 mEq/L — ABNORMAL LOW (ref 137–147)
Total Protein: 5.7 g/dL — ABNORMAL LOW (ref 6.0–8.3)

## 2014-01-13 MED ORDER — SALINE SPRAY 0.65 % NA SOLN
1.0000 | NASAL | Status: DC | PRN
  Administered 2014-01-13 – 2014-01-17 (×2): 1 via NASAL
  Filled 2014-01-13 (×2): qty 44

## 2014-01-13 MED ORDER — OXYCODONE HCL 5 MG PO TABS
5.0000 mg | ORAL_TABLET | ORAL | Status: DC | PRN
  Administered 2014-01-13 – 2014-01-14 (×3): 5 mg via ORAL
  Filled 2014-01-13 (×3): qty 1

## 2014-01-13 MED ORDER — OXYMETAZOLINE HCL 0.05 % NA SOLN
2.0000 | Freq: Two times a day (BID) | NASAL | Status: AC
  Administered 2014-01-13 – 2014-01-14 (×3): 2 via NASAL
  Filled 2014-01-13: qty 15

## 2014-01-13 MED ORDER — HYDROMORPHONE HCL 1 MG/ML IJ SOLN
0.5000 mg | INTRAMUSCULAR | Status: DC | PRN
  Administered 2014-01-14 – 2014-01-17 (×10): 0.5 mg via INTRAVENOUS
  Filled 2014-01-13 (×11): qty 1

## 2014-01-13 NOTE — Progress Notes (Signed)
[facility] PROGRESS NOTE  KORRI ASK GDJ:[id number] DOB: Jun 19, 1945 DOA: 01/11/2014 PCP: [name], MD  Assessment/Plan: 1. Jaundice, possibly related to acalculous cholecystitis. Appreciate general surgery input. Patient does have elevated bilirubin, abdominal tenderness and nausea and vomiting. Ultrasound of the abdomen shows dilated biliary tree an enlarged gallbladder. MRCP has been done with report pending. May need GI involvement. Her fevers are likely from her biliary process. We'll continue antibiotics for now. 2. Nausea and vomiting. Continue supportive treatment. Improving, wants to advance diet. 3. Dehydration. Improved with hydration 4. 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 Plans are to followup with oncology. Hold any chemotherapeutic agents until seen by oncology. 5. History of coronary artery disease. No recent chest pain. She is on aspirin and Plavix. Plavix was held on admission in anticipation of any possible procedures. 6. Chronic pain. Continue narcotics.  Code Status: full code Family Communication: discussed with patient and multiple family members at the bedside Disposition Plan: discharge home once improved   Consultants:  General surgery  Procedures:    Antibiotics:  Zosyn 01/12/14  HPI/Subjective: Patient seen in room after MRI. She found the MRI to be a difficulty study. She has not had any vomiting and wishes to advance her diet.   Objective: Filed Vitals:   01/13/14 1653  BP: 139/75  Pulse: 84  Temp: 98.3 F (36.8 C)  Resp: 18    Intake/Output Summary (Last 24 hours) at 01/13/14 1921 Last data filed at 01/13/14 1833  Gross per 24 hour  Intake 2013.33 ml  Output    550 ml  Net 1463.33 ml   Filed Weights   01/11/14 1352  Weight: 65.3 kg (143 lb 15.4 oz)    Exam:   General:  NAD  Cardiovascular: s1, s2, rrr  Respiratory: CTA B  Abdomen: tenderness, more in the upper abdomen, soft,  bs+  Musculoskeletal: no edema b/l   Data Reviewed: Basic Metabolic Panel:  Recent Labs Lab 01/11/14 1343 01/12/14 0629 01/13/14 0550  NA 130* 132* 135*  K 3.9 4.0 3.9  CL 92* 97 99  CO2 25 25 24   GLUCOSE 119* 95 106*  BUN 17 16 12   CREATININE 0.97 0.92 0.87  CALCIUM 8.8 8.5 8.6   Liver Function Tests:  Recent Labs Lab 01/11/14 1343 01/12/14 0629 01/13/14 0550  AST 57* 48* 73*  ALT 79* 63* 67*  ALKPHOS 409* 392* 494*  BILITOT 5.7* 4.2* 4.3*  PROT 6.4 5.8* 5.7*  ALBUMIN 2.2* 1.9* 1.9*    Recent Labs Lab 01/11/14 1343  LIPASE 53   No results found for this basename: AMMONIA,  in the last 168 hours CBC:  Recent Labs Lab 01/11/14 1343 01/12/14 0629 01/13/14 0550  WBC 4.4 2.8* 2.9*  HGB 9.0* 8.0* 8.4*  HCT 27.4* 25.7* 26.0*  MCV 91.6 94.5 91.9  PLT 169 145* 162   Cardiac Enzymes: No results found for this basename: CKTOTAL, CKMB, CKMBINDEX, TROPONINI,  in the last 168 hours BNP (last 3 results)  Recent Labs  02/10/13 1655  PROBNP 193.2*   CBG: No results found for this basename: GLUCAP,  in the last 168 hours  Recent Results (from the past 240 hour(s))  CULTURE, BLOOD (ROUTINE X 2)     Status: None   Collection Time    01/11/14 10:38 PM      Result Value Ref Range Status   Specimen Description BLOOD RIGHT WRIST   Final   Special Requests BOTTLES DRAWN AEROBIC AND ANAEROBIC 8CC  Final   Culture NO GROWTH 1 DAY   Final   Report Status PENDING   Incomplete  CULTURE, BLOOD (ROUTINE X 2)     Status: None   Collection Time    01/11/14 10:42 PM      Result Value Ref Range Status   Specimen Description LEFT ANTECUBITAL   Final   Special Requests BOTTLES DRAWN AEROBIC AND ANAEROBIC 6CC   Final   Culture NO GROWTH 1 DAY   Final   Report Status PENDING   Incomplete     Studies: Dg Chest 2 View  01/12/2014   CLINICAL DATA:  Fever.  Confusion.  Initial evaluation.  EXAM: CHEST  2 VIEW  COMPARISON:  Chest CT 8/17 2015.  FINDINGS: Power port  catheter tip noted projected over upper superior vena cava. Mediastinum unremarkable. Right hilar fullness noted consistent with adenopathy. Right perihilar infiltrate noted. Subsegmental atelectasis lung bases. Prior CABG. Cardiomegaly with normal pulmonary vascularity. No pleural effusion or pneumothorax. No acute osseus abnormality.  IMPRESSION: 1. Power port catheter with tip projected over superior vena cava. 2. Right hilar fullness suggesting adenopathy. Right perihilar infiltrate noted. 3. Prior CABG.  Cardiomegaly, no CHF.   Electronically Signed   By: Marcello Moores  Register   On: 01/12/2014 02:35   Mr Abdomen Mrcp Wo Cm  01/13/2014   CLINICAL DATA:  Upper abdominal pain and nausea for 1 week. Patient has a history of peritoneal carcinomatosis.  EXAM: MRI ABDOMEN WITHOUT CONTRAST  (INCLUDING MRCP)  TECHNIQUE: Multiplanar multisequence MR imaging of the abdomen was performed. Heavily T2-weighted images of the biliary and pancreatic ducts were obtained, and three-dimensional MRCP images were rendered by post processing.  COMPARISON:  CT scan 11/19/2013.  FINDINGS: Examination is quite limited due to patient motion/breathing artifact. Lack of IV contrast is also limiting.  There is new intrahepatic biliary dilatation. The common bile duct measures a maximum of 10 mm in the porta hepatis but is normal in the head of the pancreas. There is a fairly abrupt transition zone. Please see series 4 image number 21. No obvious pancreatic head mass or acute pancreatitis. No definite common bile duct stones. There is a small duodenal diverticulum near the pancreatic head but no obvious compressive mass. No ampullary lesion is identified. This could be due to peritoneal seeding and extrinsic compression.  No focal liver lesions. The portal vein demonstrates patent flow voids. The gallbladder is distended. No definite gallstones.  The spleen is upper limits of normal in size. No focal lesions. The adrenal glands and left  kidney are normal. The right kidney demonstrates new hydronephrosis. There is also upper right hydroureter. This was not present on the CT scan from 11/19/2013. I do see a small lower pole right renal calculus on the CT scan from 08/29/2013 and it is possible this is due to a obstructing ureteral calculus.  There are scattered upper abdominal lymph nodes No obvious mass or bulky adenopathy.  No significant bony findings.  IMPRESSION: New intrahepatic and common bile duct dilatation with an abrupt transition to a normal or small caliber common bile duct in the head of the pancreas. No pancreatic head mass, common bile duct stones or obvious changes of acute pancreatitis to account for this. Common bile duct tumor or extrinsic compression by peritoneal carcinomatosis is possible. ERCP may be helpful for diagnostic and therapeutic purposes.  New right-sided hydroureteronephrosis possibly due to obstructing ureteral calculus. A followup noncontrast CT scan of the abdomen/ pelvis may be helpful for further  evaluation.   Electronically Signed   By: Kalman Jewels M.D.   On: 01/13/2014 17:36   Mr 3d Recon At Scanner  01/13/2014   CLINICAL DATA:  Upper abdominal pain and nausea for 1 week. Patient has a history of peritoneal carcinomatosis.  EXAM: MRI ABDOMEN WITHOUT CONTRAST  (INCLUDING MRCP)  TECHNIQUE: Multiplanar multisequence MR imaging of the abdomen was performed. Heavily T2-weighted images of the biliary and pancreatic ducts were obtained, and three-dimensional MRCP images were rendered by post processing.  COMPARISON:  CT scan 11/19/2013.  FINDINGS: Examination is quite limited due to patient motion/breathing artifact. Lack of IV contrast is also limiting.  There is new intrahepatic biliary dilatation. The common bile duct measures a maximum of 10 mm in the porta hepatis but is normal in the head of the pancreas. There is a fairly abrupt transition zone. Please see series 4 image number 21. No obvious  pancreatic head mass or acute pancreatitis. No definite common bile duct stones. There is a small duodenal diverticulum near the pancreatic head but no obvious compressive mass. No ampullary lesion is identified. This could be due to peritoneal seeding and extrinsic compression.  No focal liver lesions. The portal vein demonstrates patent flow voids. The gallbladder is distended. No definite gallstones.  The spleen is upper limits of normal in size. No focal lesions. The adrenal glands and left kidney are normal. The right kidney demonstrates new hydronephrosis. There is also upper right hydroureter. This was not present on the CT scan from 11/19/2013. I do see a small lower pole right renal calculus on the CT scan from 08/29/2013 and it is possible this is due to a obstructing ureteral calculus.  There are scattered upper abdominal lymph nodes No obvious mass or bulky adenopathy.  No significant bony findings.  IMPRESSION: New intrahepatic and common bile duct dilatation with an abrupt transition to a normal or small caliber common bile duct in the head of the pancreas. No pancreatic head mass, common bile duct stones or obvious changes of acute pancreatitis to account for this. Common bile duct tumor or extrinsic compression by peritoneal carcinomatosis is possible. ERCP may be helpful for diagnostic and therapeutic purposes.  New right-sided hydroureteronephrosis possibly due to obstructing ureteral calculus. A followup noncontrast CT scan of the abdomen/ pelvis may be helpful for further evaluation.   Electronically Signed   By: Kalman Jewels M.D.   On: 01/13/2014 17:36    Scheduled Meds: . aspirin  81 mg Oral Daily  . DULoxetine  30 mg Oral Daily  . enoxaparin (LOVENOX) injection  40 mg Subcutaneous Q24H  . feeding supplement (RESOURCE BREEZE)  1 Container Oral TID BM  . isosorbide mononitrate  60 mg Oral Daily  . metoCLOPramide  5 mg Oral TID AC & HS  . oxymetazoline  2 spray Each Nare BID  .  piperacillin-tazobactam (ZOSYN)  IV  3.375 g Intravenous Q8H  . predniSONE  5 mg Oral Q breakfast   Continuous Infusions: . sodium chloride 50 mL/hr at 01/13/14 1756    Principal Problem:   Elevated LFTs Active Problems:   Cancer of unknown origin   Dehydration   Nausea and vomiting   Chronic pain syndrome   Benign essential HTN   Protein-calorie malnutrition, severe    Time spent: 68mins    MEMON,JEHANZEB  [facility] Pager [phone]. If 7PM-7AM, please contact night-coverage at [email or web address], password [facility] 01/13/2014, 7:21 PM  LOS: 2 days

## 2014-01-13 NOTE — Progress Notes (Signed)
Utilization review Completed Vinaya Sancho RN BSN   

## 2014-01-13 NOTE — Plan of Care (Signed)
Problem: Phase I Progression Outcomes Goal: OOB as tolerated unless otherwise ordered Outcome: Completed/Met Date Met:  01/13/14 Pt OOB to chair today for approximately 2 hours. Tolerated well.

## 2014-01-14 ENCOUNTER — Encounter (HOSPITAL_COMMUNITY): Payer: Self-pay | Admitting: Gastroenterology

## 2014-01-14 ENCOUNTER — Other Ambulatory Visit (HOSPITAL_COMMUNITY): Payer: Self-pay | Admitting: Oncology

## 2014-01-14 ENCOUNTER — Other Ambulatory Visit (HOSPITAL_COMMUNITY): Payer: Self-pay | Admitting: Hematology and Oncology

## 2014-01-14 ENCOUNTER — Inpatient Hospital Stay (HOSPITAL_COMMUNITY): Payer: PRIVATE HEALTH INSURANCE

## 2014-01-14 DIAGNOSIS — C786 Secondary malignant neoplasm of retroperitoneum and peritoneum: Secondary | ICD-10-CM

## 2014-01-14 DIAGNOSIS — R17 Unspecified jaundice: Secondary | ICD-10-CM

## 2014-01-14 DIAGNOSIS — C569 Malignant neoplasm of unspecified ovary: Secondary | ICD-10-CM

## 2014-01-14 DIAGNOSIS — D649 Anemia, unspecified: Secondary | ICD-10-CM

## 2014-01-14 DIAGNOSIS — R740 Nonspecific elevation of levels of transaminase and lactic acid dehydrogenase [LDH]: Secondary | ICD-10-CM

## 2014-01-14 DIAGNOSIS — D72819 Decreased white blood cell count, unspecified: Secondary | ICD-10-CM

## 2014-01-14 DIAGNOSIS — E8809 Other disorders of plasma-protein metabolism, not elsewhere classified: Secondary | ICD-10-CM

## 2014-01-14 LAB — COMPREHENSIVE METABOLIC PANEL
ALK PHOS: 624 U/L — AB (ref 39–117)
ALT: 74 U/L — ABNORMAL HIGH (ref 0–35)
AST: 81 U/L — AB (ref 0–37)
Albumin: 1.9 g/dL — ABNORMAL LOW (ref 3.5–5.2)
Anion gap: 12 (ref 5–15)
BILIRUBIN TOTAL: 4.2 mg/dL — AB (ref 0.3–1.2)
BUN: 12 mg/dL (ref 6–23)
CHLORIDE: 101 meq/L (ref 96–112)
CO2: 22 mEq/L (ref 19–32)
Calcium: 8.9 mg/dL (ref 8.4–10.5)
Creatinine, Ser: 0.91 mg/dL (ref 0.50–1.10)
GFR calc Af Amer: 73 mL/min — ABNORMAL LOW (ref >90)
GFR calc non Af Amer: 63 mL/min — ABNORMAL LOW (ref >90)
Glucose, Bld: 121 mg/dL — ABNORMAL HIGH (ref 70–99)
POTASSIUM: 3.8 meq/L (ref 3.7–5.3)
SODIUM: 135 meq/L — AB (ref 137–147)
Total Protein: 5.9 g/dL — ABNORMAL LOW (ref 6.0–8.3)

## 2014-01-14 LAB — CBC
HEMATOCRIT: 26.6 % — AB (ref 36.0–46.0)
Hemoglobin: 8.6 g/dL — ABNORMAL LOW (ref 12.0–15.0)
MCH: 29.7 pg (ref 26.0–34.0)
MCHC: 32.3 g/dL (ref 30.0–36.0)
MCV: 91.7 fL (ref 78.0–100.0)
Platelets: 203 10*3/uL (ref 150–400)
RBC: 2.9 MIL/uL — ABNORMAL LOW (ref 3.87–5.11)
RDW: 18.4 % — AB (ref 11.5–15.5)
WBC: 3.1 10*3/uL — ABNORMAL LOW (ref 4.0–10.5)

## 2014-01-14 LAB — FOLATE: Folate: 6.5 ng/mL

## 2014-01-14 LAB — RETICULOCYTES
RBC.: 2.95 MIL/uL — AB (ref 3.87–5.11)
RETIC COUNT ABSOLUTE: 26.6 10*3/uL (ref 19.0–186.0)
Retic Ct Pct: 0.9 % (ref 0.4–3.1)

## 2014-01-14 LAB — FERRITIN: Ferritin: 2635 ng/mL — ABNORMAL HIGH (ref 10–291)

## 2014-01-14 LAB — IRON AND TIBC
IRON: 38 ug/dL — AB (ref 42–135)
SATURATION RATIOS: 22 % (ref 20–55)
TIBC: 173 ug/dL — ABNORMAL LOW (ref 250–470)
UIBC: 135 ug/dL (ref 125–400)

## 2014-01-14 LAB — VITAMIN B12: Vitamin B-12: 660 pg/mL (ref 211–911)

## 2014-01-14 MED ORDER — PANTOPRAZOLE SODIUM 40 MG PO TBEC
40.0000 mg | DELAYED_RELEASE_TABLET | Freq: Every day | ORAL | Status: DC
  Administered 2014-01-14 – 2014-01-15 (×2): 40 mg via ORAL
  Filled 2014-01-14 (×2): qty 1

## 2014-01-14 MED ORDER — FENTANYL 12 MCG/HR TD PT72
12.5000 ug | MEDICATED_PATCH | TRANSDERMAL | Status: DC
  Filled 2014-01-14: qty 1

## 2014-01-14 MED ORDER — OXYCODONE HCL 5 MG PO TABS
10.0000 mg | ORAL_TABLET | ORAL | Status: DC | PRN
  Administered 2014-01-14 – 2014-01-15 (×4): 10 mg via ORAL
  Filled 2014-01-14 (×4): qty 2

## 2014-01-14 NOTE — Progress Notes (Signed)
MRCP results noted. Have consulted Dr. Gala Romney of gastroenterology for possible ERCP and stent placement.

## 2014-01-14 NOTE — Care Management Note (Addendum)
    Page 1 of 1   01/17/2014     12:42:53 PM CARE MANAGEMENT NOTE 01/17/2014  Patient:  Erika Moreno, Erika Moreno   Account Number:  [id number]  Date Initiated:  01/14/2014  Documentation initiated by:  Theophilus Kinds  Subjective/Objective Assessment:   Pt admitted from home with abd pain and elevated bilirubin. Pt lives alone and has a daughter that stays with her. Pt is very independent with ADL's. PCP is Dr. Delsa Bern in [place].     Action/Plan:   No discharge needs anticipated.   Anticipated DC Date:  01/16/2014   Anticipated DC Plan:  [place]  CM consult      Choice offered to / List presented to:             Status of service:  Completed, signed off Medicare Important Message given?  YES (If response is "NO", the following Medicare IM given date fields will be blank) Date Medicare IM given:  01/17/2014 Medicare IM given by:  Theophilus Kinds Date Additional Medicare IM given:   Additional Medicare IM given by:    Discharge Disposition:  HOME/SELF CARE  Per UR Regulation:    If discussed at Long Length of Stay Meetings, dates discussed:   01/17/2014    Comments:  01/17/14 [place], RN BSN CM Pt discharged home today. Pt does not qualify for home O2 as ambulating O2 sats 92%. Pt denies any home health needs.  01/14/14 [place], RN BSN CM

## 2014-01-14 NOTE — Progress Notes (Signed)
[facility] PROGRESS NOTE  YAZMEN BRIONES NFA:[id number] DOB: December 23, 1945 DOA: 01/11/2014 PCP: [name], MD  Assessment/Plan: 1. Jaundice, possibly related obstruction of common duct (intrinsic or extrinsic). Appreciate general surgery and gastroenterology input. Patient does have elevated bilirubin, abdominal tenderness and nausea and vomiting. Ultrasound of the abdomen shows dilated biliary tree. MRCP was reviewed. Plans are for ERCP tomorrow with possible stent placement. Her fevers are likely from her biliary process. We'll continue antibiotics for now. 2. Nausea and vomiting. Continue supportive treatment. Appears to have improved. Tolerating solid diet. 3. Dehydration. Improves with hydration 4. 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 Appreciate oncology input. Currently chemotherapy is on hold. 5. History of coronary artery disease. No recent chest pain. She is on aspirin and Plavix. Plavix was held on admission in anticipation of any possible procedures. 6. Chronic pain. Continue narcotics.  Code Status: full code Family Communication: discussed with patient and multiple family members at the bedside Disposition Plan: discharge home once improved   Consultants:  General surgery  Oncology  Gastroenterology  Procedures:    Antibiotics:  Zosyn 01/12/14  HPI/Subjective: Appetite appears to be improving. Pain is relatively controlled. Anxious about procedure tomorrow.  Objective: Filed Vitals:   01/14/14 1452  BP: 128/80  Pulse: 75  Temp: 98.3 F (36.8 C)  Resp: 18    Intake/Output Summary (Last 24 hours) at 01/14/14 1920 Last data filed at 01/14/14 1700  Gross per 24 hour  Intake    720 ml  Output      0 ml  Net    720 ml   Filed Weights   01/11/14 1352  Weight: 65.3 kg (143 lb 15.4 oz)    Exam:   General:  NAD  Cardiovascular: s1, s2, rrr  Respiratory: CTA B  Abdomen: tenderness, more in the upper abdomen, soft,  bs+  Musculoskeletal: no edema b/l   Data Reviewed: Basic Metabolic Panel:  Recent Labs Lab 01/11/14 1343 01/12/14 0629 01/13/14 0550 01/14/14 0617  NA 130* 132* 135* 135*  K 3.9 4.0 3.9 3.8  CL 92* 97 99 101  CO2 25 25 24 22   GLUCOSE 119* 95 106* 121*  BUN 17 16 12 12   CREATININE 0.97 0.92 0.87 0.91  CALCIUM 8.8 8.5 8.6 8.9   Liver Function Tests:  Recent Labs Lab 01/11/14 1343 01/12/14 0629 01/13/14 0550 01/14/14 0617  AST 57* 48* 73* 81*  ALT 79* 63* 67* 74*  ALKPHOS 409* 392* 494* 624*  BILITOT 5.7* 4.2* 4.3* 4.2*  PROT 6.4 5.8* 5.7* 5.9*  ALBUMIN 2.2* 1.9* 1.9* 1.9*    Recent Labs Lab 01/11/14 1343  LIPASE 53   No results found for this basename: AMMONIA,  in the last 168 hours CBC:  Recent Labs Lab 01/11/14 1343 01/12/14 0629 01/13/14 0550 01/14/14 0617  WBC 4.4 2.8* 2.9* 3.1*  HGB 9.0* 8.0* 8.4* 8.6*  HCT 27.4* 25.7* 26.0* 26.6*  MCV 91.6 94.5 91.9 91.7  PLT 169 145* 162 203   Cardiac Enzymes: No results found for this basename: CKTOTAL, CKMB, CKMBINDEX, TROPONINI,  in the last 168 hours BNP (last 3 results)  Recent Labs  02/10/13 1655  PROBNP 193.2*   CBG: No results found for this basename: GLUCAP,  in the last 168 hours  Recent Results (from the past 240 hour(s))  CULTURE, BLOOD (ROUTINE X 2)     Status: None   Collection Time    01/11/14 10:38 PM      Result Value Ref Range Status  Specimen Description BLOOD RIGHT WRIST   Final   Special Requests BOTTLES DRAWN AEROBIC AND ANAEROBIC 8CC   Final   Culture NO GROWTH 3 DAYS   Final   Report Status PENDING   Incomplete  CULTURE, BLOOD (ROUTINE X 2)     Status: None   Collection Time    01/11/14 10:42 PM      Result Value Ref Range Status   Specimen Description LEFT ANTECUBITAL   Final   Special Requests BOTTLES DRAWN AEROBIC AND ANAEROBIC 6CC   Final   Culture NO GROWTH 3 DAYS   Final   Report Status PENDING   Incomplete     Studies: Mr Abdomen Mrcp Wo Cm  01/13/2014    CLINICAL DATA:  Upper abdominal pain and nausea for 1 week. Patient has a history of peritoneal carcinomatosis.  EXAM: MRI ABDOMEN WITHOUT CONTRAST  (INCLUDING MRCP)  TECHNIQUE: Multiplanar multisequence MR imaging of the abdomen was performed. Heavily T2-weighted images of the biliary and pancreatic ducts were obtained, and three-dimensional MRCP images were rendered by post processing.  COMPARISON:  CT scan 11/19/2013.  FINDINGS: Examination is quite limited due to patient motion/breathing artifact. Lack of IV contrast is also limiting.  There is new intrahepatic biliary dilatation. The common bile duct measures a maximum of 10 mm in the porta hepatis but is normal in the head of the pancreas. There is a fairly abrupt transition zone. Please see series 4 image number 21. No obvious pancreatic head mass or acute pancreatitis. No definite common bile duct stones. There is a small duodenal diverticulum near the pancreatic head but no obvious compressive mass. No ampullary lesion is identified. This could be due to peritoneal seeding and extrinsic compression.  No focal liver lesions. The portal vein demonstrates patent flow voids. The gallbladder is distended. No definite gallstones.  The spleen is upper limits of normal in size. No focal lesions. The adrenal glands and left kidney are normal. The right kidney demonstrates new hydronephrosis. There is also upper right hydroureter. This was not present on the CT scan from 11/19/2013. I do see a small lower pole right renal calculus on the CT scan from 08/29/2013 and it is possible this is due to a obstructing ureteral calculus.  There are scattered upper abdominal lymph nodes No obvious mass or bulky adenopathy.  No significant bony findings.  IMPRESSION: New intrahepatic and common bile duct dilatation with an abrupt transition to a normal or small caliber common bile duct in the head of the pancreas. No pancreatic head mass, common bile duct stones or obvious  changes of acute pancreatitis to account for this. Common bile duct tumor or extrinsic compression by peritoneal carcinomatosis is possible. ERCP may be helpful for diagnostic and therapeutic purposes.  New right-sided hydroureteronephrosis possibly due to obstructing ureteral calculus. A followup noncontrast CT scan of the abdomen/ pelvis may be helpful for further evaluation.   Electronically Signed   By: Kalman Jewels M.D.   On: 01/13/2014 17:36   Mr 3d Recon At Scanner  01/13/2014   CLINICAL DATA:  Upper abdominal pain and nausea for 1 week. Patient has a history of peritoneal carcinomatosis.  EXAM: MRI ABDOMEN WITHOUT CONTRAST  (INCLUDING MRCP)  TECHNIQUE: Multiplanar multisequence MR imaging of the abdomen was performed. Heavily T2-weighted images of the biliary and pancreatic ducts were obtained, and three-dimensional MRCP images were rendered by post processing.  COMPARISON:  CT scan 11/19/2013.  FINDINGS: Examination is quite limited due to patient motion/breathing artifact.  Lack of IV contrast is also limiting.  There is new intrahepatic biliary dilatation. The common bile duct measures a maximum of 10 mm in the porta hepatis but is normal in the head of the pancreas. There is a fairly abrupt transition zone. Please see series 4 image number 21. No obvious pancreatic head mass or acute pancreatitis. No definite common bile duct stones. There is a small duodenal diverticulum near the pancreatic head but no obvious compressive mass. No ampullary lesion is identified. This could be due to peritoneal seeding and extrinsic compression.  No focal liver lesions. The portal vein demonstrates patent flow voids. The gallbladder is distended. No definite gallstones.  The spleen is upper limits of normal in size. No focal lesions. The adrenal glands and left kidney are normal. The right kidney demonstrates new hydronephrosis. There is also upper right hydroureter. This was not present on the CT scan from  11/19/2013. I do see a small lower pole right renal calculus on the CT scan from 08/29/2013 and it is possible this is due to a obstructing ureteral calculus.  There are scattered upper abdominal lymph nodes No obvious mass or bulky adenopathy.  No significant bony findings.  IMPRESSION: New intrahepatic and common bile duct dilatation with an abrupt transition to a normal or small caliber common bile duct in the head of the pancreas. No pancreatic head mass, common bile duct stones or obvious changes of acute pancreatitis to account for this. Common bile duct tumor or extrinsic compression by peritoneal carcinomatosis is possible. ERCP may be helpful for diagnostic and therapeutic purposes.  New right-sided hydroureteronephrosis possibly due to obstructing ureteral calculus. A followup noncontrast CT scan of the abdomen/ pelvis may be helpful for further evaluation.   Electronically Signed   By: Kalman Jewels M.D.   On: 01/13/2014 17:36    Scheduled Meds: . aspirin  81 mg Oral Daily  . DULoxetine  30 mg Oral Daily  . feeding supplement (RESOURCE BREEZE)  1 Container Oral TID BM  . fentaNYL  12.5 mcg Transdermal Q72H  . isosorbide mononitrate  60 mg Oral Daily  . metoCLOPramide  5 mg Oral TID AC & HS  . oxymetazoline  2 spray Each Nare BID  . pantoprazole  40 mg Oral Daily  . piperacillin-tazobactam (ZOSYN)  IV  3.375 g Intravenous Q8H  . predniSONE  5 mg Oral Q breakfast   Continuous Infusions: . sodium chloride 50 mL/hr at 01/14/14 1407    Principal Problem:   Elevated LFTs Active Problems:   Cancer of unknown origin   Dehydration   Nausea and vomiting   Chronic pain syndrome   Benign essential HTN   Protein-calorie malnutrition, severe    Time spent: 54mins    Ricci Dirocco  [facility] Pager [phone]. If 7PM-7AM, please contact night-coverage at [email or web address], password [email or web address] 01/14/2014, 7:20 PM  LOS: 3 days

## 2014-01-14 NOTE — Consult Note (Signed)
Referring Provider: Dr. Arnoldo Morale Primary Care Physician:  Dr. Quintin Alto  Primary Gastroenterologist:  Dr. 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 She is followed by Dr. Barnet Glasgow with Oncology here at [facility]. Deals with chronic pain. States presented to the ED due to extreme fatigue. Noted diffuse pain, multiple sites. Lower abdominal pain, lower back pain. Moving caused severe nausea and dry heaves. Some vomiting. Kept refusing to seek medical attention thinking it was related to known cancer. Hurt "all over". +fever/chills. States 103 when she got to [place]. Thought perhaps her symptoms were cancer-related. Decided to seek care due to worsening lethargy, weakness, poor appetite. Thinking about food makes her gag. History of chronic constipation but has had intermittent loose stool since admission to [facility]. States stool light-colored, clay colored now darkened some. Urine extremely dark prior to admission. Significantly decreased po intake prior to admission.   On Plavix as an outpatient, last dose on Thursday, Oct 8. Received Lovenox 40 mg yesterday evening. LFTs elevated. MRCP showed new intrahepatic and CBD dilatation with abrupt transition to a normal/small caliber CBD in head of pancreas. No obvious CBD stones or changes of acute pancreatitis. CBD tumor or extrinsic compression by peritoneal carcinomatosis as differentials.    Last colonoscopy possibly in [place] a little over a year ago.   Past Medical History  Diagnosis Date  . 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 Cervical cancer 1984  . Myocardial infarction 20 yrs ago  . Coronary artery disease   . Anxiety   . Chronic back pain   .  Depression   . GERD (gastroesophageal reflux disease)   . Hypertension   . Hypothyroidism   . Anemia   . Vitamin B 12 deficiency   . Vitamin D deficiency disease     Past Surgical History  Procedure Laterality Date  . Heart bypass      triple bypass with stents  . Coronary artery bypass graft      3 vessels  . Cardiac catheterization      stents x3  . Abdominal hysterectomy      partial  . Portacath placement Right 12/06/2012    Procedure: INSERTION PORT-A-CATH;  Surgeon: Donato Heinz, MD;  Location: [facility];  Service: General;  Laterality: Right;    Prior to Admission medications   Medication Sig Start Date End Date Taking? Authorizing Provider  ALPRAZolam (XANAX) 0.25 MG tablet Take 1 tablet (0.25 mg total) by mouth every 6 (six) hours as needed for anxiety. 11/26/13  Yes Baird Cancer, PA-C  aspirin 81 MG tablet Take 81 mg by mouth daily.   Yes Historical Provider, MD  clopidogrel (PLAVIX) 75 MG tablet Take 75 mg by mouth daily.   Yes Historical Provider, MD  cyclobenzaprine (FLEXERIL) 10 MG tablet Take 10 mg by mouth 3 (three) times daily as needed for muscle spasms.   Yes Historical Provider, MD  diphenhydrAMINE (NIGHT TIME SLEEP AID) 25 MG tablet Take 25 mg by mouth at bedtime as needed for sleep.   Yes Historical Provider, MD  DULoxetine (CYMBALTA) 30 MG capsule Take 1 capsule (30 mg total) by mouth daily. 07/30/13  Yes Farrel Gobble, MD  furosemide (LASIX) 20  MG tablet Take 20 mg by mouth daily.    Yes Historical Provider, MD  hydrOXYzine (ATARAX/VISTARIL) 10 MG tablet Take 10 mg by mouth every 8 (eight) hours as needed (pruitus).    Yes Historical Provider, MD  isosorbide mononitrate (IMDUR) 60 MG 24 hr tablet Take 60 mg by mouth daily.    Yes Historical Provider, MD  lisinopril-hydrochlorothiazide (PRINZIDE,ZESTORETIC) 10-12.5 MG per tablet Take 1 tablet by mouth daily.    Yes Historical Provider, MD  magnesium hydroxide (MILK OF MAGNESIA) 400 MG/5ML suspension Take  30 mLs by mouth daily as needed for mild constipation.   Yes Historical Provider, MD  metoCLOPramide (REGLAN) 5 MG tablet Take 5 mg 3-4 times a day before meals and at bedtime to help with abdominal bloating. 01/02/14  Yes Farrel Gobble, MD  Multiple Vitamins-Minerals (HAIR/SKIN/NAILS PO) Take 1 tablet by mouth daily.   Yes Historical Provider, MD  nitroGLYCERIN (NITROSTAT) 0.6 MG SL tablet Place 0.4 mg under the tongue every 5 (five) minutes as needed for chest pain.    Yes Historical Provider, MD  ondansetron (ZOFRAN) 8 MG tablet Take 1 tablet by mouth every 8 hours as needed for nausea and vomiting 12/13/13  Yes Baird Cancer, PA-C  Oxycodone HCl 10 MG TABS Take 1 or 2 tablets every 2-4 hours as needed for pain. 01/02/14  Yes Farrel Gobble, MD  potassium chloride SA (K-DUR,KLOR-CON) 20 MEQ tablet Take 1 tablet (20 mEq total) by mouth 2 (two) times daily. 12/26/13  Yes Farrel Gobble, MD  predniSONE (DELTASONE) 5 MG tablet Take 5 mg by mouth daily with breakfast.  07/09/13  Yes Farrel Gobble, MD  prochlorperazine (COMPAZINE) 10 MG tablet Take 1 tablet (10 mg total) by mouth every 6 (six) hours as needed for nausea or vomiting. 12/19/13  Yes [name] Kefalas, PA-C  senna-docusate (SENOKOT-S) 8.6-50 MG per tablet Take 3 tablets by mouth at bedtime.   Yes Historical Provider, MD  everolimus (AFINITOR) 5 MG tablet Take 1 tablet (5 mg total) by mouth daily. 01/02/14   Farrel Gobble, MD  fentaNYL (DURAGESIC - DOSED MCG/HR) 25 MCG/HR patch Place 1 patch (25 mcg total) onto the skin every 3 (three) days. 01/08/14   Baird Cancer, PA-C    Current Facility-Administered Medications  Medication Dose Route Frequency Provider Last Rate Last Dose  . 0.9 %  sodium chloride infusion   Intravenous Continuous Kathie Dike, MD 50 mL/hr at 01/13/14 1756    . acetaminophen (TYLENOL) tablet 650 mg  650 mg Oral Q6H PRN Kathie Dike, MD   650 mg at 01/13/14 0805   Or  . acetaminophen (TYLENOL) suppository  650 mg  650 mg Rectal Q6H PRN Kathie Dike, MD      . ALPRAZolam Duanne Moron) tablet 0.25 mg  0.25 mg Oral Q6H PRN Kathie Dike, MD   0.25 mg at 01/13/14 1541  . aspirin chewable tablet 81 mg  81 mg Oral Daily Kathie Dike, MD   81 mg at 01/13/14 0819  . cyclobenzaprine (FLEXERIL) tablet 10 mg  10 mg Oral TID PRN Kathie Dike, MD   10 mg at 01/14/14 0416  . DULoxetine (CYMBALTA) DR capsule 30 mg  30 mg Oral Daily Kathie Dike, MD   30 mg at 01/13/14 0805  . enoxaparin (LOVENOX) injection 40 mg  40 mg Subcutaneous Q24H Kathie Dike, MD   40 mg at 01/13/14 2144  . feeding supplement (RESOURCE BREEZE) (RESOURCE BREEZE) liquid 1 Container  1 Container Oral TID BM Jenifer A  Jimmye Norman, RD   1 Container at 01/11/14 2052  . HYDROmorphone (DILAUDID) injection 0.5 mg  0.5 mg Intravenous Q4H PRN Kathie Dike, MD      . hydrOXYzine (ATARAX/VISTARIL) tablet 10 mg  10 mg Oral Q8H PRN Kathie Dike, MD      . isosorbide mononitrate (IMDUR) 24 hr tablet 60 mg  60 mg Oral Daily Kathie Dike, MD   60 mg at 01/13/14 0805  . metoCLOPramide (REGLAN) tablet 5 mg  5 mg Oral TID AC & HS Kathie Dike, MD   5 mg at 01/13/14 2149  . nitroGLYCERIN (NITROSTAT) SL tablet 0.4 mg  0.4 mg Sublingual Q5 min PRN Kathie Dike, MD      . ondansetron (ZOFRAN) tablet 4 mg  4 mg Oral Q6H PRN Kathie Dike, MD       Or  . ondansetron (ZOFRAN) injection 4 mg  4 mg Intravenous Q6H PRN Kathie Dike, MD   4 mg at 01/13/14 2230  . oxyCODONE (Oxy IR/ROXICODONE) immediate release tablet 5 mg  5 mg Oral Q4H PRN Kathie Dike, MD   5 mg at 01/14/14 0412  . oxymetazoline (AFRIN) 0.05 % nasal spray 2 spray  2 spray Each Nare BID Kathie Dike, MD   2 spray at 01/13/14 2144  . piperacillin-tazobactam (ZOSYN) IVPB 3.375 g  3.375 g Intravenous Q8H Lavonia Drafts [place], RPH   3.375 g at 01/14/14 0102  . predniSONE (DELTASONE) tablet 5 mg  5 mg Oral Q breakfast Kathie Dike, MD   5 mg at 01/13/14 0805  . sodium chloride  (OCEAN) 0.65 % nasal spray 1 spray  1 spray Each Nare PRN Kathie Dike, MD   1 spray at 01/13/14 1756  . zolpidem (AMBIEN) tablet 5 mg  5 mg Oral QHS PRN Kathie Dike, MD        Allergies as of 01/11/2014 - Review Complete 01/11/2014  Allergen Reaction Noted  . Avastin [bevacizumab] Hives and Other (See Comments) 09/04/2013  . Morphine and related Other (See Comments) 10/03/2012  . Reglan [metoclopramide] Other (See Comments) 09/04/2013  . Sulfa antibiotics Itching 10/03/2012    Family History  Problem Relation Age of Onset  . Heart attack Mother   . Heart attack Father   . Colon cancer Paternal Grandmother     History   Social History  . Marital Status: Widowed    Spouse Name: N/A    Number of Children: N/A  . Years of Education: N/A   Occupational History  . Not on file.   Social History Main Topics  . Smoking status: Former Smoker -- 2.00 packs/day for 22 years    Types: Cigarettes    Quit date: 11/21/1988  . Smokeless tobacco: Never Used  . Alcohol Use: No  . Drug Use: No  . Sexual Activity: Not on file   Other Topics Concern  . Not on file   Social History Narrative  . No narrative on file    Review of Systems: Gen: see HPI CV: Denies chest pain, heart palpitations, syncope, edema  Resp: SOB overnight GI: see HPI GU : see HPI MS: see HPI Derm: Denies rash, itching, dry skin Psych: Denies depression, anxiety,confusion, or memory loss   Physical Exam: Vital signs in last 24 hours: Temp:  [98 F (36.7 C)-98.3 F (36.8 C)] 98 F (36.7 C) (10/11 2159) Pulse Rate:  [77-84] 77 (10/11 2159) Resp:  [18-20] 20 (10/11 2159) BP: (138-139)/(75-90) 138/90 mmHg (10/11 2159) SpO2:  [94 %-96 %] 96 % (  10/11 2159) Last BM Date: 01/13/14 General:   Alert, jaundiced Head:  Normocephalic and atraumatic. Eyes:  +scleral icterus Ears:  Normal auditory acuity. Nose:  No deformity, discharge,  or lesions. Mouth:  No deformity or lesions Lungs:  Clear  throughout to auscultation.   No wheezes, crackles, or rhonchi. No acute distress. Heart:  S1 S2 present without murmur Abdomen:  +BS, soft, TTP lower abdomen, upper abdomen, RUQ, no rebound or guarding Rectal:  Deferred   Msk:  Symmetrical without gross deformities. Normal posture. Extremities:  Pedal edema. Small skin ulcers to left leg  Neurologic:  Alert and  oriented x4;  grossly normal neurologically. Skin:  Jaundiced Psych:  Alert and cooperative. Normal mood and affect.  Intake/Output from previous day: 10/11 0701 - 10/12 0700 In: 1856 [P.O.:720; I.V.:435; IV Piggyback:100] Out: -  Intake/Output this shift:    Lab Results:  Recent Labs  01/12/14 0629 01/13/14 0550 01/14/14 0617  WBC 2.8* 2.9* 3.1*  HGB 8.0* 8.4* 8.6*  HCT 25.7* 26.0* 26.6*  PLT 145* 162 203   BMET  Recent Labs  01/12/14 0629 01/13/14 0550 01/14/14 0617  NA 132* 135* 135*  K 4.0 3.9 3.8  CL 97 99 101  CO2 25 24 22   GLUCOSE 95 106* 121*  BUN 16 12 12   CREATININE 0.92 0.87 0.91  CALCIUM 8.5 8.6 8.9   LFT  Recent Labs  01/11/14 1343 01/12/14 0629 01/13/14 0550 01/14/14 0617  PROT 6.4 5.8* 5.7* 5.9*  ALBUMIN 2.2* 1.9* 1.9* 1.9*  AST 57* 48* 73* 81*  ALT 79* 63* 67* 74*  ALKPHOS 409* 392* 494* 624*  BILITOT 5.7* 4.2* 4.3* 4.2*  BILIDIR  --  3.4*  --   --   IBILI  --  0.8  --   --    PT/INR  Recent Labs  01/12/14 0629  LABPROT 16.2*  INR 1.30    Studies/Results: Mr Abdomen Mrcp Wo Cm  01/13/2014   CLINICAL DATA:  Upper abdominal pain and nausea for 1 week. Patient has a history of peritoneal carcinomatosis.  EXAM: MRI ABDOMEN WITHOUT CONTRAST  (INCLUDING MRCP)  TECHNIQUE: Multiplanar multisequence MR imaging of the abdomen was performed. Heavily T2-weighted images of the biliary and pancreatic ducts were obtained, and three-dimensional MRCP images were rendered by post processing.  COMPARISON:  CT scan 11/19/2013.  FINDINGS: Examination is quite limited due to patient  motion/breathing artifact. Lack of IV contrast is also limiting.  There is new intrahepatic biliary dilatation. The common bile duct measures a maximum of 10 mm in the porta hepatis but is normal in the head of the pancreas. There is a fairly abrupt transition zone. Please see series 4 image number 21. No obvious pancreatic head mass or acute pancreatitis. No definite common bile duct stones. There is a small duodenal diverticulum near the pancreatic head but no obvious compressive mass. No ampullary lesion is identified. This could be due to peritoneal seeding and extrinsic compression.  No focal liver lesions. The portal vein demonstrates patent flow voids. The gallbladder is distended. No definite gallstones.  The spleen is upper limits of normal in size. No focal lesions. The adrenal glands and left kidney are normal. The right kidney demonstrates new hydronephrosis. There is also upper right hydroureter. This was not present on the CT scan from 11/19/2013. I do see a small lower pole right renal calculus on the CT scan from 08/29/2013 and it is possible this is due to a obstructing ureteral calculus.  There  are scattered upper abdominal lymph nodes No obvious mass or bulky adenopathy.  No significant bony findings.  IMPRESSION: New intrahepatic and common bile duct dilatation with an abrupt transition to a normal or small caliber common bile duct in the head of the pancreas. No pancreatic head mass, common bile duct stones or obvious changes of acute pancreatitis to account for this. Common bile duct tumor or extrinsic compression by peritoneal carcinomatosis is possible. ERCP may be helpful for diagnostic and therapeutic purposes.  New right-sided hydroureteronephrosis possibly due to obstructing ureteral calculus. A followup noncontrast CT scan of the abdomen/ pelvis may be helpful for further evaluation.   Electronically Signed   By: Kalman Jewels M.D.   On: 01/13/2014 17:36   Mr 3d Recon At  Scanner  01/13/2014   CLINICAL DATA:  Upper abdominal pain and nausea for 1 week. Patient has a history of peritoneal carcinomatosis.  EXAM: MRI ABDOMEN WITHOUT CONTRAST  (INCLUDING MRCP)  TECHNIQUE: Multiplanar multisequence MR imaging of the abdomen was performed. Heavily T2-weighted images of the biliary and pancreatic ducts were obtained, and three-dimensional MRCP images were rendered by post processing.  COMPARISON:  CT scan 11/19/2013.  FINDINGS: Examination is quite limited due to patient motion/breathing artifact. Lack of IV contrast is also limiting.  There is new intrahepatic biliary dilatation. The common bile duct measures a maximum of 10 mm in the porta hepatis but is normal in the head of the pancreas. There is a fairly abrupt transition zone. Please see series 4 image number 21. No obvious pancreatic head mass or acute pancreatitis. No definite common bile duct stones. There is a small duodenal diverticulum near the pancreatic head but no obvious compressive mass. No ampullary lesion is identified. This could be due to peritoneal seeding and extrinsic compression.  No focal liver lesions. The portal vein demonstrates patent flow voids. The gallbladder is distended. No definite gallstones.  The spleen is upper limits of normal in size. No focal lesions. The adrenal glands and left kidney are normal. The right kidney demonstrates new hydronephrosis. There is also upper right hydroureter. This was not present on the CT scan from 11/19/2013. I do see a small lower pole right renal calculus on the CT scan from 08/29/2013 and it is possible this is due to a obstructing ureteral calculus.  There are scattered upper abdominal lymph nodes No obvious mass or bulky adenopathy.  No significant bony findings.  IMPRESSION: New intrahepatic and common bile duct dilatation with an abrupt transition to a normal or small caliber common bile duct in the head of the pancreas. No pancreatic head mass, common bile duct  stones or obvious changes of acute pancreatitis to account for this. Common bile duct tumor or extrinsic compression by peritoneal carcinomatosis is possible. ERCP may be helpful for diagnostic and therapeutic purposes.  New right-sided hydroureteronephrosis possibly due to obstructing ureteral calculus. A followup noncontrast CT scan of the abdomen/ pelvis may be helpful for further evaluation.   Electronically Signed   By: Kalman Jewels M.D.   On: 01/13/2014 17:36    Impression: 68 year old female with stage IV ovarian cancer with peritoneal carcinomatosis admitted secondary to worsening pain, fatigue, jaundice and elevated LFTs; MRCP with concern for common bile duct tumor or extrinsic compression by peritoneal carcinomatosis. LFTs remain elevated, with bilirubin 4.3. Zosyn for antibiotic coverage. Needs ERCP with sphincterotomy, possible stent placement, bile duct brushings; however, her last dose of Plavix was on Oct 8. Per guidelines, Plavix must be held 5  days prior. Will tentatively plan on 10/13 for ERCP. Will also need to hold evening dose of Lovenox. Discussed in detail with patient the risks and benefits of the procedure. Stated understanding and desires to proceed with medically appropriate. She understands the need for waiting an additional day due to increased risk of bleeding secondary to Plavix.   Plan: NPO after midnight Hold Lovenox dosing this evening Continue to hold Plavix Add PPI for GI prophylaxis Continue Zosyn  Tentatively plan on ERCP with sphincterotomy, possible stent placement, bile duct brushings 10/13 with Dr. Gala Romney.  Follow LFTs  Orvil Feil, ANP-BC [facility]     LOS: 3 days    01/14/2014, 8:18 AM   Attending note:  Patient seen and examined. MRCP images reviewed with Dr. Ardeen Garland. 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 She has  significant upstream biliary dilation.  What ever the cause, patient needs an ERCP with biliary decompression via stenting. I feel it would be best to put in a non-covered permanent wall stent to alleviate her obstruction. She will have been off Plavix over 5 days by procedure time tomorrow. I discussed the risks, benefits, limitations, alternatives and imponderables with this nice lady. Her questions have been answered. She is agreeable. Lovenox will also be held. Agree with Zosyn for the time being.

## 2014-01-14 NOTE — Consult Note (Signed)
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  Results from [place] demonstrated a mutation in her malignancy that may respond to Afinitor (mTor inhibitor).  With this data, we got Afinitor approved and it was delivered to her home on Friday.  She has not yet started the medication.    Cancer of unknown origin   11/15/2012 - 02/21/2013 Chemotherapy Carboplatin/Paclitaxel   11/15/2012 Treatment Plan Change Transfer of care to [facility] from [place]   03/22/2013 Remission PET scan- CR   04/16/2013 - 08/20/2013 Chemotherapy Avastin maintenance every 21 days   08/29/2013 Progression PET- progression of disease   09/05/2013 Imaging MUGA demonstrates EF of 63%   09/10/2013 - 12/19/2013 Chemotherapy Doxil every 28 days   12/12/2013 Surgery Biopsy axillary lymph node with foundation One analysis revealing possible sensitivity to mTOR inhibitor.   On Friday (10/9), I received a phone call from [facility] ED physician reporting that the patient come to their ED jaundiced with an elevated bilirubin.  The patient insisted on being transferred to [facility] where she has been receiving her care for 12 months or greater.  After our conversation, I recommended that the physician contact the [facility] ED physician and  arrange transfer.  At [facility], her bilirubin and alk phos were elevated.  I personally reviewed and went over laboratory results with the patient.  The results are noted within this dictation.  I personally reviewed and went over radiographic studies with the patient.  The results are noted within this dictation.  Imaging studies demonstrate common bile duct dilatation.  This is the likely cause of her jaundice and electrolyte abnormalities.   GI was consulted and they plan on performing ERCP with stent placement, but the patient is on Plavix.  This was held starting on Friday and it is recommended by GI the Plavix be held x 5 days.  Therefore, she may be a candidate for intervention tomorrow.  The patient notes that her pain is poorly controlled. She was on Fentanyl 25 mcg/hr patch but she notes this made her very drowsy and she wanted to sleep.  I will alter her pain regimen.  Other than pain, she denies any complaints and ROS questioning is negative.  PAST MEDICAL HISTORY:   Past Medical History  Diagnosis Date  . 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 Cervical cancer 1984  . Myocardial infarction 20 yrs ago  . Coronary artery disease   . Anxiety   . Chronic back pain   . Depression   . GERD (gastroesophageal reflux disease)   . Hypertension   . Hypothyroidism   . Anemia   . Vitamin B 12 deficiency   . Vitamin D deficiency disease     ALLERGIES: Allergies  Allergen Reactions  . Avastin [Bevacizumab] Hives and  Other (See Comments)    Took Prednisone along with Avastin to decrease hives  . Morphine And Related Other (See Comments)    Hallucinates with large IV doses.  . Reglan [Metoclopramide] Other (See Comments)    Hands shaking  . Sulfa Antibiotics Itching      MEDICATIONS: I have reviewed the patient's current medications.     PAST SURGICAL HISTORY Past Surgical History  Procedure Laterality Date  . Heart bypass      triple bypass  with stents  . Coronary artery bypass graft      3 vessels  . Cardiac catheterization      stents x3  . Abdominal hysterectomy      partial  . Portacath placement Right 12/06/2012    Procedure: INSERTION PORT-A-CATH;  Surgeon: Donato Heinz, MD;  Location: [facility];  Service: General;  Laterality: Right;    FAMILY HISTORY: Family History  Problem Relation Age of Onset  . Heart attack Mother   . Heart attack Father   . Colon cancer Paternal Grandmother     SOCIAL HISTORY:  reports that she quit smoking about 25 years ago. Her smoking use included Cigarettes. She has a 44 pack-year smoking history. She has never used smokeless tobacco. She reports that she does not drink alcohol or use illicit drugs.  PERFORMANCE STATUS: The patient's performance status is 1 - Symptomatic but completely ambulatory  PHYSICAL EXAM: Most Recent Vital Signs: Blood pressure 138/90, pulse 77, temperature 98 F (36.7 C), temperature source Oral, resp. rate 20, height _0  (1.626 m), weight 143 lb 15.4 oz (65.3 kg), SpO2 96.00%. General appearance: alert, cooperative, appears stated age and icteric Head: Normocephalic, without obvious abnormality, atraumatic Eyes: positive findings: sclera jaundice Neck: supple, symmetrical, trachea midline Back: ecchymosis Lungs: clear to auscultation bilaterally Skin: jaundice noted Neurologic: Grossly normal  LABORATORY DATA:  Results for orders placed during the hospital encounter of 01/11/14 (from the past 48 hour(s))  COMPREHENSIVE METABOLIC PANEL     Status: Abnormal   Collection Time    01/13/14  5:50 AM      Result Value Ref Range   Sodium 135 (*) 137 - 147 mEq/L   Potassium 3.9  3.7 - 5.3 mEq/L   Chloride 99  96 - 112 mEq/L   CO2 24  19 - 32 mEq/L   Glucose, Bld 106 (*) 70 - 99 mg/dL   BUN 12  6 - 23 mg/dL   Creatinine, Ser 0.87  0.50 - 1.10 mg/dL   Calcium 8.6  8.4 - 10.5 mg/dL   Total Protein 5.7 (*) 6.0 - 8.3 g/dL   Albumin 1.9 (*) 3.5 - 5.2 g/dL    AST 73 (*) 0 - 37 U/L   ALT 67 (*) 0 - 35 U/L   Alkaline Phosphatase 494 (*) 39 - 117 U/L   Total Bilirubin 4.3 (*) 0.3 - 1.2 mg/dL   GFR calc non Af Amer 67 (*) >90 mL/min   GFR calc Af Amer 78 (*) >90 mL/min   Comment: (NOTE)     The eGFR has been calculated using the CKD EPI equation.     This calculation has not been validated in all clinical situations.     eGFR's persistently <90 mL/min signify possible Chronic Kidney     Disease.   Anion gap 12  5 - 15  CBC     Status: Abnormal   Collection Time    01/13/14  5:50 AM  Result Value Ref Range   WBC 2.9 (*) 4.0 - 10.5 K/uL   RBC 2.83 (*) 3.87 - 5.11 MIL/uL   Hemoglobin 8.4 (*) 12.0 - 15.0 g/dL   HCT 26.0 (*) 36.0 - 46.0 %   MCV 91.9  78.0 - 100.0 fL   MCH 29.7  26.0 - 34.0 pg   MCHC 32.3  30.0 - 36.0 g/dL   RDW 18.2 (*) 11.5 - 15.5 %   Platelets 162  150 - 400 K/uL  COMPREHENSIVE METABOLIC PANEL     Status: Abnormal   Collection Time    01/14/14  6:17 AM      Result Value Ref Range   Sodium 135 (*) 137 - 147 mEq/L   Potassium 3.8  3.7 - 5.3 mEq/L   Chloride 101  96 - 112 mEq/L   CO2 22  19 - 32 mEq/L   Glucose, Bld 121 (*) 70 - 99 mg/dL   BUN 12  6 - 23 mg/dL   Creatinine, Ser 0.91  0.50 - 1.10 mg/dL   Calcium 8.9  8.4 - 10.5 mg/dL   Total Protein 5.9 (*) 6.0 - 8.3 g/dL   Albumin 1.9 (*) 3.5 - 5.2 g/dL   AST 81 (*) 0 - 37 U/L   ALT 74 (*) 0 - 35 U/L   Alkaline Phosphatase 624 (*) 39 - 117 U/L   Total Bilirubin 4.2 (*) 0.3 - 1.2 mg/dL   GFR calc non Af Amer 63 (*) >90 mL/min   GFR calc Af Amer 73 (*) >90 mL/min   Comment: (NOTE)     The eGFR has been calculated using the CKD EPI equation.     This calculation has not been validated in all clinical situations.     eGFR's persistently <90 mL/min signify possible Chronic Kidney     Disease.   Anion gap 12  5 - 15  CBC     Status: Abnormal   Collection Time    01/14/14  6:17 AM      Result Value Ref Range   WBC 3.1 (*) 4.0 - 10.5 K/uL   RBC 2.90 (*) 3.87 -  5.11 MIL/uL   Hemoglobin 8.6 (*) 12.0 - 15.0 g/dL   HCT 26.6 (*) 36.0 - 46.0 %   MCV 91.7  78.0 - 100.0 fL   MCH 29.7  26.0 - 34.0 pg   MCHC 32.3  30.0 - 36.0 g/dL   RDW 18.4 (*) 11.5 - 15.5 %   Platelets 203  150 - 400 K/uL      RADIOGRAPHY: Mr Abdomen Mrcp Wo Cm  01/13/2014   CLINICAL DATA:  Upper abdominal pain and nausea for 1 week. Patient has a history of peritoneal carcinomatosis.  EXAM: MRI ABDOMEN WITHOUT CONTRAST  (INCLUDING MRCP)  TECHNIQUE: Multiplanar multisequence MR imaging of the abdomen was performed. Heavily T2-weighted images of the biliary and pancreatic ducts were obtained, and three-dimensional MRCP images were rendered by post processing.  COMPARISON:  CT scan 11/19/2013.  FINDINGS: Examination is quite limited due to patient motion/breathing artifact. Lack of IV contrast is also limiting.  There is new intrahepatic biliary dilatation. The common bile duct measures a maximum of 10 mm in the porta hepatis but is normal in the head of the pancreas. There is a fairly abrupt transition zone. Please see series 4 image number 21. No obvious pancreatic head mass or acute pancreatitis. No definite common bile duct stones. There is a small duodenal diverticulum near the pancreatic head  but no obvious compressive mass. No ampullary lesion is identified. This could be due to peritoneal seeding and extrinsic compression.  No focal liver lesions. The portal vein demonstrates patent flow voids. The gallbladder is distended. No definite gallstones.  The spleen is upper limits of normal in size. No focal lesions. The adrenal glands and left kidney are normal. The right kidney demonstrates new hydronephrosis. There is also upper right hydroureter. This was not present on the CT scan from 11/19/2013. I do see a small lower pole right renal calculus on the CT scan from 08/29/2013 and it is possible this is due to a obstructing ureteral calculus.  There are scattered upper abdominal lymph nodes No  obvious mass or bulky adenopathy.  No significant bony findings.  IMPRESSION: New intrahepatic and common bile duct dilatation with an abrupt transition to a normal or small caliber common bile duct in the head of the pancreas. No pancreatic head mass, common bile duct stones or obvious changes of acute pancreatitis to account for this. Common bile duct tumor or extrinsic compression by peritoneal carcinomatosis is possible. ERCP may be helpful for diagnostic and therapeutic purposes.  New right-sided hydroureteronephrosis possibly due to obstructing ureteral calculus. A followup noncontrast CT scan of the abdomen/ pelvis may be helpful for further evaluation.   Electronically Signed   By: Kalman Jewels M.D.   On: 01/13/2014 17:36   Mr 3d Recon At Scanner  01/13/2014   CLINICAL DATA:  Upper abdominal pain and nausea for 1 week. Patient has a history of peritoneal carcinomatosis.  EXAM: MRI ABDOMEN WITHOUT CONTRAST  (INCLUDING MRCP)  TECHNIQUE: Multiplanar multisequence MR imaging of the abdomen was performed. Heavily T2-weighted images of the biliary and pancreatic ducts were obtained, and three-dimensional MRCP images were rendered by post processing.  COMPARISON:  CT scan 11/19/2013.  FINDINGS: Examination is quite limited due to patient motion/breathing artifact. Lack of IV contrast is also limiting.  There is new intrahepatic biliary dilatation. The common bile duct measures a maximum of 10 mm in the porta hepatis but is normal in the head of the pancreas. There is a fairly abrupt transition zone. Please see series 4 image number 21. No obvious pancreatic head mass or acute pancreatitis. No definite common bile duct stones. There is a small duodenal diverticulum near the pancreatic head but no obvious compressive mass. No ampullary lesion is identified. This could be due to peritoneal seeding and extrinsic compression.  No focal liver lesions. The portal vein demonstrates patent flow voids. The gallbladder  is distended. No definite gallstones.  The spleen is upper limits of normal in size. No focal lesions. The adrenal glands and left kidney are normal. The right kidney demonstrates new hydronephrosis. There is also upper right hydroureter. This was not present on the CT scan from 11/19/2013. I do see a small lower pole right renal calculus on the CT scan from 08/29/2013 and it is possible this is due to a obstructing ureteral calculus.  There are scattered upper abdominal lymph nodes No obvious mass or bulky adenopathy.  No significant bony findings.  IMPRESSION: New intrahepatic and common bile duct dilatation with an abrupt transition to a normal or small caliber common bile duct in the head of the pancreas. No pancreatic head mass, common bile duct stones or obvious changes of acute pancreatitis to account for this. Common bile duct tumor or extrinsic compression by peritoneal carcinomatosis is possible. ERCP may be helpful for diagnostic and therapeutic purposes.  New right-sided hydroureteronephrosis  possibly due to obstructing ureteral calculus. A followup noncontrast CT scan of the abdomen/ pelvis may be helpful for further evaluation.   Electronically Signed   By: Kalman Jewels M.D.   On: 01/13/2014 17:36       PATHOLOGY:  Nothing new.  FoundationOne results scanned into [facility]   ASSESSMENT:  1. Jaundice 2. Common bile duct dilatation.  Biliary tract infection with obstruction, ?extrinsic pressure from carcinomatosis 3. Transaminitis 4. Hypoalbuminemia 5. Elevated total bilirubin 6. Anemia, improving 7. Leukopenia 8. 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  Results from [place] demonstrated a mutation in her malignancy that may respond to Afinitor (mTor inhibitor).  Afinitor delivered on 01/11/2014 but patient has not started  medication. 9. Pain, secondary to #8.  Patient Active Problem List   Diagnosis Date Noted  . Protein-calorie malnutrition, severe 01/12/2014  . Elevated LFTs 01/11/2014  . Dehydration 01/11/2014  . Nausea and vomiting 01/11/2014  . Chronic pain syndrome 01/11/2014  . Benign essential HTN 01/11/2014  . Dyspnea on exertion 02/10/2013  . Neuropathy due to chemotherapeutic drug 02/10/2013  . Anemia due to chemotherapy 01/24/2013  . Cancer of unknown origin 11/14/2012    PLAN:  1. I personally reviewed and went over laboratory results with the patient.  The results are noted within this dictation. 2. I personally reviewed and went over radiographic studies with the patient.  The results are noted within this dictation.   3. Chart reviewed 4. Increase Roxicodone to 10 mg every 3 hours PRN 5. D/C Tylenol due to liver function 6. Fentanyl 12.5 mcg/hr transdermally every 72 hours. 7. Labs today: Anemia panel 8. Hold Plavix 9. Hold Afinitor for now. 10. GI consult for ERCP. 11. Will follow along as an inpatient.  All questions were answered. The patient knows to call the clinic with any problems, questions or concerns. We can certainly see the patient much sooner if necessary.  Patient and plan discussed with Dr. Farrel Gobble and he is in agreement with the aforementioned.   KEFALAS,THOMAS 01/14/2014

## 2014-01-15 ENCOUNTER — Encounter (HOSPITAL_COMMUNITY): Payer: Self-pay | Admitting: *Deleted

## 2014-01-15 ENCOUNTER — Inpatient Hospital Stay (HOSPITAL_COMMUNITY): Payer: PRIVATE HEALTH INSURANCE

## 2014-01-15 ENCOUNTER — Inpatient Hospital Stay (HOSPITAL_COMMUNITY): Payer: PRIVATE HEALTH INSURANCE | Admitting: Anesthesiology

## 2014-01-15 ENCOUNTER — Encounter (HOSPITAL_COMMUNITY)
Admission: AD | Disposition: A | Discharge status: Home / Self Care | Payer: Self-pay | Source: Other Acute Inpatient Hospital | Attending: Internal Medicine

## 2014-01-15 ENCOUNTER — Encounter (HOSPITAL_COMMUNITY): Payer: PRIVATE HEALTH INSURANCE | Admitting: Anesthesiology

## 2014-01-15 DIAGNOSIS — J810 Acute pulmonary edema: Secondary | ICD-10-CM

## 2014-01-15 HISTORY — PX: SPHINCTEROTOMY: SHX5544

## 2014-01-15 HISTORY — PX: BALLOON DILATION: SHX5330

## 2014-01-15 HISTORY — PX: ERCP: SHX5425

## 2014-01-15 HISTORY — PX: BILIARY STENT PLACEMENT: SHX5538

## 2014-01-15 LAB — COMPREHENSIVE METABOLIC PANEL
ALBUMIN: 2.1 g/dL — AB (ref 3.5–5.2)
ALK PHOS: 766 U/L — AB (ref 39–117)
ALT: 88 U/L — ABNORMAL HIGH (ref 0–35)
ANION GAP: 13 (ref 5–15)
AST: 97 U/L — ABNORMAL HIGH (ref 0–37)
BILIRUBIN TOTAL: 3.9 mg/dL — AB (ref 0.3–1.2)
BUN: 12 mg/dL (ref 6–23)
CHLORIDE: 101 meq/L (ref 96–112)
CO2: 22 meq/L (ref 19–32)
CREATININE: 1.02 mg/dL (ref 0.50–1.10)
Calcium: 9.1 mg/dL (ref 8.4–10.5)
GFR calc Af Amer: 64 mL/min — ABNORMAL LOW (ref >90)
GFR calc non Af Amer: 55 mL/min — ABNORMAL LOW (ref >90)
Glucose, Bld: 98 mg/dL (ref 70–99)
Potassium: 4 mEq/L (ref 3.7–5.3)
Sodium: 136 mEq/L — ABNORMAL LOW (ref 137–147)
Total Protein: 6.3 g/dL (ref 6.0–8.3)

## 2014-01-15 LAB — BLOOD GAS, ARTERIAL
Acid-base deficit: 5.6 mmol/L — ABNORMAL HIGH (ref 0.0–2.0)
BICARBONATE: 18.5 meq/L — AB (ref 20.0–24.0)
Drawn by: 234301
FIO2: 100 %
MECHVT: 450 mL
O2 SAT: 97.6 %
PEEP/CPAP: 5 cmH2O
PH ART: 7.387 (ref 7.350–7.450)
PO2 ART: 111 mmHg — AB (ref 80.0–100.0)
Patient temperature: 37
RATE: 15 resp/min
TCO2: 17 mmol/L (ref 0–100)
pCO2 arterial: 31.6 mmHg — ABNORMAL LOW (ref 35.0–45.0)

## 2014-01-15 LAB — CBC
HEMATOCRIT: 27.7 % — AB (ref 36.0–46.0)
Hemoglobin: 8.8 g/dL — ABNORMAL LOW (ref 12.0–15.0)
MCH: 29.3 pg (ref 26.0–34.0)
MCHC: 31.8 g/dL (ref 30.0–36.0)
MCV: 92.3 fL (ref 78.0–100.0)
Platelets: 234 10*3/uL (ref 150–400)
RBC: 3 MIL/uL — AB (ref 3.87–5.11)
RDW: 18.7 % — AB (ref 11.5–15.5)
WBC: 4.6 10*3/uL (ref 4.0–10.5)

## 2014-01-15 LAB — MRSA PCR SCREENING: MRSA BY PCR: POSITIVE — AB

## 2014-01-15 SURGERY — ERCP, WITH INTERVENTION IF INDICATED
Anesthesia: General

## 2014-01-15 SURGERY — CANCELLED PROCEDURE

## 2014-01-15 MED ORDER — ALBUTEROL SULFATE HFA 108 (90 BASE) MCG/ACT IN AERS
INHALATION_SPRAY | RESPIRATORY_TRACT | Status: AC
  Filled 2014-01-15: qty 6.7

## 2014-01-15 MED ORDER — SODIUM CHLORIDE 0.9 % IV SOLN
50.0000 ug/h | INTRAVENOUS | Status: DC
  Filled 2014-01-15: qty 50

## 2014-01-15 MED ORDER — GLYCOPYRROLATE 0.2 MG/ML IJ SOLN
0.2000 mg | Freq: Once | INTRAMUSCULAR | Status: AC
  Administered 2014-01-15: 0.2 mg via INTRAVENOUS

## 2014-01-15 MED ORDER — FENTANYL CITRATE 0.05 MG/ML IJ SOLN
INTRAMUSCULAR | Status: DC | PRN
  Administered 2014-01-15 (×5): 50 ug via INTRAVENOUS

## 2014-01-15 MED ORDER — OXYCODONE HCL 5 MG PO TABS
10.0000 mg | ORAL_TABLET | ORAL | Status: DC | PRN
  Administered 2014-01-16 – 2014-01-17 (×6): 10 mg via ORAL
  Filled 2014-01-15 (×6): qty 2

## 2014-01-15 MED ORDER — ETOMIDATE 2 MG/ML IV SOLN: INTRAVENOUS | Status: DC | PRN

## 2014-01-15 MED ORDER — SUCCINYLCHOLINE CHLORIDE 20 MG/ML IJ SOLN
INTRAMUSCULAR | Status: DC | PRN
  Administered 2014-01-15: 100 mg via INTRAVENOUS
  Administered 2014-01-15: 60 mg via INTRAVENOUS

## 2014-01-15 MED ORDER — METOPROLOL TARTRATE 1 MG/ML IV SOLN
INTRAVENOUS | Status: AC
  Filled 2014-01-15: qty 5

## 2014-01-15 MED ORDER — PANTOPRAZOLE SODIUM 40 MG IV SOLR
40.0000 mg | Freq: Every day | INTRAVENOUS | Status: DC
  Administered 2014-01-15 – 2014-01-17 (×3): 40 mg via INTRAVENOUS
  Filled 2014-01-15 (×3): qty 40

## 2014-01-15 MED ORDER — FENTANYL CITRATE 0.05 MG/ML IJ SOLN
INTRAMUSCULAR | Status: AC
  Filled 2014-01-15: qty 2

## 2014-01-15 MED ORDER — SODIUM CHLORIDE 0.9 % IJ SOLN
INTRAMUSCULAR | Status: AC
  Filled 2014-01-15: qty 20

## 2014-01-15 MED ORDER — MIDAZOLAM HCL 2 MG/2ML IJ SOLN
2.0000 mg | Freq: Once | INTRAMUSCULAR | Status: AC
  Administered 2014-01-15: 2 mg via INTRAVENOUS

## 2014-01-15 MED ORDER — SODIUM CHLORIDE 0.9 % IV SOLN
INTRAVENOUS | Status: AC
  Administered 2014-01-15: 1000 mL
  Filled 2014-01-15: qty 50

## 2014-01-15 MED ORDER — MIDAZOLAM HCL 2 MG/2ML IJ SOLN
INTRAMUSCULAR | Status: AC
  Filled 2014-01-15: qty 2

## 2014-01-15 MED ORDER — NEOSTIGMINE METHYLSULFATE 10 MG/10ML IV SOLN
INTRAVENOUS | Status: DC | PRN
  Administered 2014-01-15 (×5): 1 mg via INTRAVENOUS

## 2014-01-15 MED ORDER — STERILE WATER FOR IRRIGATION IR SOLN
Status: DC | PRN
  Administered 2014-01-15: 13:00:00

## 2014-01-15 MED ORDER — FENTANYL CITRATE 0.05 MG/ML IJ SOLN
50.0000 ug | INTRAMUSCULAR | Status: DC | PRN
  Administered 2014-01-15: 50 ug via INTRAVENOUS
  Filled 2014-01-15: qty 2

## 2014-01-15 MED ORDER — CETYLPYRIDINIUM CHLORIDE 0.05 % MT LIQD
7.0000 mL | Freq: Four times a day (QID) | OROMUCOSAL | Status: DC
  Administered 2014-01-15 – 2014-01-17 (×8): 7 mL via OROMUCOSAL

## 2014-01-15 MED ORDER — MIDAZOLAM HCL 2 MG/2ML IJ SOLN: 2.0000 mg | INTRAMUSCULAR | Status: DC | PRN

## 2014-01-15 MED ORDER — HYDRALAZINE HCL 20 MG/ML IJ SOLN
INTRAMUSCULAR | Status: AC
  Filled 2014-01-15: qty 1

## 2014-01-15 MED ORDER — FENTANYL CITRATE 0.05 MG/ML IJ SOLN
25.0000 ug | INTRAMUSCULAR | Status: DC | PRN
  Filled 2014-01-15: qty 2

## 2014-01-15 MED ORDER — ARTIFICIAL TEARS OP OINT
TOPICAL_OINTMENT | OPHTHALMIC | Status: AC
  Filled 2014-01-15: qty 3.5

## 2014-01-15 MED ORDER — MIDAZOLAM HCL 2 MG/2ML IJ SOLN
1.0000 mg | INTRAMUSCULAR | Status: DC | PRN
  Administered 2014-01-15: 2 mg via INTRAVENOUS

## 2014-01-15 MED ORDER — LIDOCAINE HCL (CARDIAC) 20 MG/ML IV SOLN
INTRAVENOUS | Status: DC | PRN
  Administered 2014-01-15: 20 mg via INTRAVENOUS

## 2014-01-15 MED ORDER — MUPIROCIN 2 % EX OINT
1.0000 "application " | TOPICAL_OINTMENT | Freq: Two times a day (BID) | CUTANEOUS | Status: DC
  Administered 2014-01-15 – 2014-01-17 (×4): 1 via NASAL
  Filled 2014-01-15: qty 22

## 2014-01-15 MED ORDER — FUROSEMIDE 10 MG/ML IJ SOLN
INTRAMUSCULAR | Status: AC
  Filled 2014-01-15: qty 4

## 2014-01-15 MED ORDER — GLYCOPYRROLATE 0.2 MG/ML IJ SOLN
INTRAMUSCULAR | Status: DC | PRN
  Administered 2014-01-15 (×2): 0.2 mg via INTRAVENOUS

## 2014-01-15 MED ORDER — EPHEDRINE SULFATE 50 MG/ML IJ SOLN
INTRAMUSCULAR | Status: AC
  Filled 2014-01-15: qty 1

## 2014-01-15 MED ORDER — ETOMIDATE 2 MG/ML IV SOLN
INTRAVENOUS | Status: DC | PRN
  Administered 2014-01-15: 10 mg via INTRAVENOUS

## 2014-01-15 MED ORDER — ETOMIDATE 2 MG/ML IV SOLN
INTRAVENOUS | Status: AC
  Filled 2014-01-15: qty 10

## 2014-01-15 MED ORDER — MIDAZOLAM HCL 5 MG/5ML IJ SOLN
INTRAMUSCULAR | Status: DC | PRN
  Administered 2014-01-15 (×2): 1 mg via INTRAVENOUS

## 2014-01-15 MED ORDER — HYDRALAZINE HCL 20 MG/ML IJ SOLN
INTRAMUSCULAR | Status: DC | PRN
  Administered 2014-01-15: 3 mg via INTRAVENOUS
  Administered 2014-01-15: 5 mg via INTRAVENOUS

## 2014-01-15 MED ORDER — SUCCINYLCHOLINE CHLORIDE 20 MG/ML IJ SOLN
INTRAMUSCULAR | Status: AC
  Filled 2014-01-15: qty 1

## 2014-01-15 MED ORDER — LIDOCAINE HCL (PF) 1 % IJ SOLN
INTRAMUSCULAR | Status: AC
Start: 2014-01-15 — End: 2014-01-15
  Filled 2014-01-15: qty 5

## 2014-01-15 MED ORDER — DOCUSATE SODIUM 100 MG PO CAPS: 100.0000 mg | ORAL_CAPSULE | Freq: Two times a day (BID) | ORAL | Status: DC | PRN

## 2014-01-15 MED ORDER — FENTANYL CITRATE 0.05 MG/ML IJ SOLN
50.0000 ug | INTRAMUSCULAR | Status: DC | PRN
  Administered 2014-01-15 (×2): 50 ug via INTRAVENOUS
  Filled 2014-01-15 (×2): qty 2

## 2014-01-15 MED ORDER — GLYCOPYRROLATE 0.2 MG/ML IJ SOLN
INTRAMUSCULAR | Status: AC
  Filled 2014-01-15: qty 1

## 2014-01-15 MED ORDER — SODIUM CHLORIDE 0.9 % IV SOLN
INTRAVENOUS | Status: DC | PRN
  Administered 2014-01-15: 13:00:00

## 2014-01-15 MED ORDER — NALOXONE HCL 0.4 MG/ML IJ SOLN
INTRAMUSCULAR | Status: AC
  Filled 2014-01-15: qty 1

## 2014-01-15 MED ORDER — NALOXONE HCL 0.4 MG/ML IJ SOLN
INTRAMUSCULAR | Status: DC | PRN
  Administered 2014-01-15: .1 ug via INTRAVENOUS

## 2014-01-15 MED ORDER — ROCURONIUM BROMIDE 50 MG/5ML IV SOLN
INTRAVENOUS | Status: AC
  Filled 2014-01-15: qty 1

## 2014-01-15 MED ORDER — ONDANSETRON HCL 4 MG/2ML IJ SOLN
4.0000 mg | Freq: Once | INTRAMUSCULAR | Status: AC
  Administered 2014-01-15: 4 mg via INTRAVENOUS

## 2014-01-15 MED ORDER — GLUCAGON HCL RDNA (DIAGNOSTIC) 1 MG IJ SOLR
INTRAMUSCULAR | Status: DC | PRN
  Administered 2014-01-15: .5 mg via INTRAVENOUS

## 2014-01-15 MED ORDER — SODIUM CHLORIDE 0.9 % IV SOLN
50.0000 ug/h | INTRAVENOUS | Status: DC
  Administered 2014-01-15: 50 ug/h via INTRAVENOUS
  Filled 2014-01-15: qty 50

## 2014-01-15 MED ORDER — IPRATROPIUM-ALBUTEROL 0.5-2.5 (3) MG/3ML IN SOLN
3.0000 mL | RESPIRATORY_TRACT | Status: DC
  Administered 2014-01-15 – 2014-01-16 (×6): 3 mL via RESPIRATORY_TRACT
  Filled 2014-01-15 (×6): qty 3

## 2014-01-15 MED ORDER — NEOSTIGMINE METHYLSULFATE 10 MG/10ML IV SOLN
INTRAVENOUS | Status: AC
  Filled 2014-01-15: qty 1

## 2014-01-15 MED ORDER — LIDOCAINE HCL (PF) 1 % IJ SOLN
INTRAMUSCULAR | Status: AC
  Filled 2014-01-15: qty 5

## 2014-01-15 MED ORDER — LACTATED RINGERS IV SOLN
INTRAVENOUS | Status: DC
  Administered 2014-01-15 (×2): via INTRAVENOUS

## 2014-01-15 MED ORDER — FUROSEMIDE 10 MG/ML IJ SOLN
20.0000 mg | Freq: Two times a day (BID) | INTRAMUSCULAR | Status: DC
  Administered 2014-01-15 – 2014-01-16 (×2): 20 mg via INTRAVENOUS
  Filled 2014-01-15 (×2): qty 2

## 2014-01-15 MED ORDER — CHLORHEXIDINE GLUCONATE 0.12 % MT SOLN
15.0000 mL | Freq: Two times a day (BID) | OROMUCOSAL | Status: DC
  Administered 2014-01-15 – 2014-01-17 (×4): 15 mL via OROMUCOSAL
  Filled 2014-01-15 (×4): qty 15

## 2014-01-15 MED ORDER — ROCURONIUM BROMIDE 100 MG/10ML IV SOLN
INTRAVENOUS | Status: DC | PRN
  Administered 2014-01-15: 5 mg via INTRAVENOUS
  Administered 2014-01-15: 10 mg via INTRAVENOUS

## 2014-01-15 MED ORDER — ONDANSETRON HCL 4 MG/2ML IJ SOLN
INTRAMUSCULAR | Status: AC
  Filled 2014-01-15: qty 2

## 2014-01-15 MED ORDER — ALBUTEROL SULFATE HFA 108 (90 BASE) MCG/ACT IN AERS
INHALATION_SPRAY | RESPIRATORY_TRACT | Status: DC | PRN
  Administered 2014-01-15 (×2): 5 via RESPIRATORY_TRACT

## 2014-01-15 MED ORDER — GLYCOPYRROLATE 0.2 MG/ML IJ SOLN
INTRAMUSCULAR | Status: AC
  Filled 2014-01-15: qty 2

## 2014-01-15 MED ORDER — GLUCAGON HCL RDNA (DIAGNOSTIC) 1 MG IJ SOLR
INTRAMUSCULAR | Status: AC
  Filled 2014-01-15: qty 1

## 2014-01-15 MED ORDER — ONDANSETRON HCL 4 MG/2ML IJ SOLN: 4.0000 mg | Freq: Once | INTRAMUSCULAR | Status: DC | PRN

## 2014-01-15 MED ORDER — FUROSEMIDE 10 MG/ML IJ SOLN
INTRAMUSCULAR | Status: DC | PRN
  Administered 2014-01-15: 10 mg via INTRAMUSCULAR
  Administered 2014-01-15: 30 mg via INTRAMUSCULAR

## 2014-01-15 MED ORDER — DOCUSATE SODIUM 50 MG/5ML PO LIQD
100.0000 mg | Freq: Two times a day (BID) | ORAL | Status: DC | PRN
  Filled 2014-01-15: qty 10

## 2014-01-15 MED ORDER — CHLORHEXIDINE GLUCONATE CLOTH 2 % EX PADS
6.0000 | MEDICATED_PAD | Freq: Every day | CUTANEOUS | Status: DC
  Administered 2014-01-17: 6 via TOPICAL

## 2014-01-15 MED ORDER — SEVOFLURANE IN SOLN
RESPIRATORY_TRACT | Status: AC
  Filled 2014-01-15: qty 250

## 2014-01-15 SURGICAL SUPPLY — 15 items
BALLN TIP RX DIL 6-4X5.8X180 (BALLOONS) IMPLANT
BLOCK BITE 60FR ADLT L/F BLUE (MISCELLANEOUS) ×2 IMPLANT
BRUSH CYTOL RX .035 2.1X8X200 (MISCELLANEOUS) ×2 IMPLANT
DEVICE INFLATION ENCORE 26 (MISCELLANEOUS) ×2 IMPLANT
DEVICE LOCKING W-BIOPSY CAP (MISCELLANEOUS) ×3 IMPLANT
FLOOR PAD 36X40 (MISCELLANEOUS) ×3
KIT CLEAN ENDO COMPLIANCE (KITS) ×3 IMPLANT
PAD FLOOR 36X40 (MISCELLANEOUS) IMPLANT
SPHINCTEROTOME HYDRATOME 44 (MISCELLANEOUS) ×3 IMPLANT
STENT WALLFX BIL METAL 10X60 (Stent) ×2 IMPLANT
SYR 50ML LL SCALE MARK (SYRINGE) ×4 IMPLANT
SYSTEM CONTINUOUS INJECTION (MISCELLANEOUS) ×3 IMPLANT
TIP RX DIL BLN 6-4X5.8X180 (BALLOONS) ×3
WALLSTENT METAL COVERED 10X60 (STENTS) ×2 IMPLANT
WATER STERILE IRR 1000ML POUR (IV SOLUTION) ×2 IMPLANT

## 2014-01-15 NOTE — Progress Notes (Signed)
ERCP with biliary sphincterotomy, biliary stricture balloon dilation, cytology brushing and expandable wall stent placement performed. See full dictated procedure note.

## 2014-01-15 NOTE — Progress Notes (Signed)
Respiratory compromise following general anesthesia necessitated reintubation for the time being. She is resting comfortably in the ICU and is stable. I have updated multiple family members in the waiting room of the ICU. Followup review of images with radiology indicates stent in excellent position. Possible new right pulmonary mass likely needs further evaluation. I have discussed today's events and new radiology findings findings with Dr. Ulice Bold at length. Anticipate extubation in the next couple of days. We'll recheck hepatic profile tomorrow morning;  followup on cytology brushings.

## 2014-01-15 NOTE — Addendum Note (Signed)
Addendum created 01/15/14 1626 by Ollen Bowl, CRNA   Modules edited: Clinical Notes   Clinical Notes:  File: [id number]

## 2014-01-15 NOTE — Progress Notes (Signed)
Transported to icu on ventalator with anesthesia and resp therapist.

## 2014-01-15 NOTE — Anesthesia Preprocedure Evaluation (Addendum)
Anesthesia Evaluation  Patient identified by MRN, date of birth, ID band Patient awake    Reviewed: Allergy & Precautions, H&P , NPO status , Patient's Chart, lab work & pertinent test results  Airway Mallampati: II TM Distance: >3 FB Neck ROM: Full    Dental  (+) Edentulous Upper, Edentulous Lower   Pulmonary former smoker,  breath sounds clear to auscultation        Cardiovascular hypertension, Pt. on medications + CAD, + Past MI and + CABG Rhythm:Regular Rate:Normal     Neuro/Psych PSYCHIATRIC DISORDERS Anxiety Depression    GI/Hepatic GERD-  Medicated,  Endo/Other  Hypothyroidism   Renal/GU      Musculoskeletal  (+) Arthritis - (chronic LBP),   Abdominal   Peds  Hematology  (+) anemia ,   Anesthesia Other Findings Stage IV abdominal CA with biliary obstruction from CBD compression.  Reproductive/Obstetrics                          Anesthesia Physical Anesthesia Plan  ASA: V  Anesthesia Plan: General   Post-op Pain Management:    Induction: Intravenous  Airway Management Planned: Oral ETT  Additional Equipment:   Intra-op Plan:   Post-operative Plan: Extubation in OR  Informed Consent: I have reviewed the patients History and Physical, chart, labs and discussed the procedure including the risks, benefits and alternatives for the proposed anesthesia with the patient or authorized representative who has indicated his/her understanding and acceptance.     Plan Discussed with:   Anesthesia Plan Comments: (Amidate induction.  Post op:  On emergence, patient's WOB increased, RR 30's, decreased breath sounds with some wheezing.  Received albuterol, apresaline, lasix.  Extubated and then had to be reintubated in the PACU before transferring to the ICU.)       Anesthesia Quick Evaluation

## 2014-01-15 NOTE — Op Note (Signed)
Erika Moreno, Erika Moreno              ACCOUNT NO.:  [id number]  MEDICAL RECORD NO.:  [id number]  LOCATION:                                 FACILITY:  PHYSICIAN:  R. Garfield Cornea, MD [place]:  March 16, 1946  DATE OF PROCEDURE:  01/15/2014 DATE OF DISCHARGE:                              OPERATIVE REPORT   PROCEDURES:  ERCP with Biliary sphincterotomy, balloon stricture dilation, cytology brushing and expandable metallic stent placement.  INDICATIONS FOR PROCEDURE:  The patient is a 68 year old lady with advanced intraabdominal carcinomatosis (uncertain primary; query breast versus ovarian origin) who presents with extrahepatic biliary obstruction, abdominal pain fever and leukocytosis.[name]  An MRCP demonstrates a long (about 4 cm) stricture at the level of the pancreatic head without obvious mass or extrinsic compression with marked upstream biliary dilation.  Her LFTs are all elevated.  Patient likely has presented with an element of cholangitis. She is on IV Zosyn.  ERCP is now being done to decompress her biliary tree.  The risks, benefits, limitations, alternatives, imponderables have been reviewed with the patient and multiple family members.  All parties questions answered.  Potential for bleeding, perforation, and pancreatitis all reviewed.  She has now been off Plavix over 5 days.  Lovenox was held yesterday.  DESCRIPTION OF PROCEDURE:  Endotracheal anesthesia was induced by Dr. Duwayne Heck and associates.  The patient was placed in semi-prone position.  INSTRUMENT:  Pentax video chip system duodenoscope.  FINDINGS:  Cursory examination of distal esophagus and stomach revealed no abnormalities.  The pyloric channel is easily traversed with the scope.  Examination of the bulb and second portion revealed normal- appearing mucosa.  The ampulla was identified on the medial wall of the second portion of the duodenum.  There was a large redundant fold essentially obscuring  the ampulla initially;  utilizing the Microvasive Autotome, the ampulla was approached using guidewire palpation; almost instantaneously, I achieved deep biliary cannulation.  The sphincterotome followed up; a cholangiogram was obtained.  Consistent with MRCP findings, there was at least a 4-cm distal stricture with marked upstream dilation of the biliary tree with manipulation of the catheters through the stricture black viscous ("molasses like") bile was seen to flow copiously through the ampullary orifice with the safety wire, being maintained deep in the biliary tree.  The sphincter was pulled back across the ampullary orifice at the 12 o'clock position.  Using the ERBE unit at 20 joules, a 5-mm sphincterotomy was performed.  I could not go Beyond 5 mm because of concerns of the overlying redundant duodenal fold overhead.  This was done without apparent complication and with no bleeding.  The sphincterotome was railed off.  A 2 cm x 6 mm Hurricane dilating balloon was railed on and the distal half of this stricture was dilated with the balloon being fully inflated x1 minute and it was taken down and moved in tandem fashion to the proximal segment of stricturing and this maneuver was repeated x1 minute.  It was taken down and railed off the guidewire.  With this maneuver, there was even more drainage of black viscous bile and subsequently, a cytology brush was railed on the wire and the stricture  was brushed for cytology. A [employer] 60 mm x 10 mm expandable metallic stent was railed into position under fluoroscopic and endoscopic control.  It was deployed across the stricture.  The proximal end of the stent was seen to deploy above the stricture and the distal end protruding through the ampullary orifice a good 1 cm.  There was continued good flow of viscous bile through the stent after deployment. Final film of the stent with the scope removed, was taken with  the radiologist.  Pancreatic duct was not injected or manipulated.  The patient tolerated the procedure well.  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  RECOMMENDATIONS:  Clear liquid diet with the remainder of the day check hepatic profile tomorrow morning.  Advance diet as tolerated.     Bridgette Habermann, MD [place]     RMR/MEDQ  D:  01/15/2014  T:  01/15/2014  Job:  [id number]  cc:   Farrel Gobble, MD  Jamesetta So, M.D. Fax: [phone]  Consuello Masse, MD Fax: [phone]

## 2014-01-15 NOTE — Consult Note (Signed)
Full note to follow. Events noted. She  Is talking around  The ETT.  i will advance 2 cm.  She may need   A larger ETT.

## 2014-01-15 NOTE — Anesthesia Procedure Notes (Addendum)
Procedure Name: Intubation Date/Time: 01/15/2014 12:40 PM Performed by: Tressie Stalker E Pre-anesthesia Checklist: Patient identified, Patient being monitored, Timeout performed, Emergency Drugs available and Suction available Patient Re-evaluated:Patient Re-evaluated prior to inductionOxygen Delivery Method: Circle System Utilized Preoxygenation: Pre-oxygenation with 100% oxygen Intubation Type: IV induction, Rapid sequence and Cricoid Pressure applied Ventilation: Mask ventilation without difficulty Laryngoscope Size: Mac and 3 Grade View: Grade I Tube type: Oral Tube size: 7.0 mm Number of attempts: 1 Airway Equipment and Method: stylet Placement Confirmation: ETT inserted through vocal cords under direct vision,  positive ETCO2 and breath sounds checked- equal and bilateral Secured at: 21 cm Tube secured with: Tape Dental Injury: Teeth and Oropharynx as per pre-operative assessment     Procedure Name: Intubation Date/Time: 01/15/2014 3:07 PM Performed by: Vista Deck Pre-anesthesia Checklist: Patient identified, Emergency Drugs available, Suction available, Patient being monitored and Timeout performed Patient Re-evaluated:Patient Re-evaluated prior to inductionOxygen Delivery Method: Ambu bag Preoxygenation: Pre-oxygenation with 100% oxygen Intubation Type: IV induction, Rapid sequence and Cricoid Pressure applied Ventilation: Mask ventilation without difficulty Laryngoscope Size: Mac and 3 Grade View: Grade I Tube type: Subglottic suction tube Tube size: 7.0 mm Number of attempts: 1 Placement Confirmation: ETT inserted through vocal cords under direct vision,  CO2 detector and breath sounds checked- equal and bilateral Secured at: 22 cm Comments: Intubated by K. Daniel CRNA in PACU.

## 2014-01-15 NOTE — Progress Notes (Signed)
[facility] PROGRESS NOTE  LEEANNE BUTTERS MGQ:[id number] DOB: March 02, 1946 DOA: 01/11/2014 PCP: [name], MD  Assessment/Plan: 1. Jaundice, possibly related obstruction of common duct (intrinsic or extrinsic). Appreciate general surgery and gastroenterology input. Patient does have elevated bilirubin, abdominal tenderness and nausea and vomiting. Ultrasound of the abdomen shows dilated biliary tree. MRCP was reviewed. ERCP was done which revealed long critical biliary stricture with marked upstream dilatation of the biliary tree. Metallic stent was placed. Recommendations are to continue antibiotics at this time. 2. Acute respiratory failure. Patient was intubated for ERCP. Post procedure, she was extubated and developed significant respiratory distress. Chest x-ray indicated signs of congestive heart failure. The patient was given Lasix, but her shortness of breath did not improve. She was reintubated for respiratory support. We will consult Dr. Luan Pulling assist with her management. We'll keep on the ventilator for now and consider extubation trial morning. 3. Possible lung mass. Chest x-ray indicated possible mass. This will need to be followed up by CT chest. This can be done once her respiratory status has stabilized. 4. Acute pulmonary edema. Possibly related to IV fluids received in the operating room. We'll start her on intravenous Lasix. Reassess chest x-ray morning 5. Nausea and vomiting. Continue supportive treatment. Appears to have improved. Tolerating solid diet, but currently n.p.o. 6. Dehydration. Improved with hydration 7. 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 Appreciate oncology input. Currently chemotherapy is on hold. 8. History of coronary artery disease. No recent chest pain. She is on aspirin and Plavix. Plavix was held on admission in anticipation of any possible procedures. 9. Chronic pain. Continue narcotics.  Code Status: full code Family  Communication: No family present Disposition Plan: discharge home once improved   Consultants:  General surgery  Oncology  Gastroenterology  Procedures: 10/13 ERCP: Long critical biliary stricture with marked upstream  dilation of the biliary tree status post biliary sphincterotomy and  balloon stricture dilation, followed by brushing for cytology and  expandable metallic stent placement as described above.  10/13 intubation  Antibiotics:  Zosyn 01/12/14  HPI/Subjective: Appetite appears to be improving. Pain is relatively controlled. Anxious about procedure tomorrow.  Objective: Filed Vitals:   01/15/14 1510  BP: 122/77  Pulse: 103  Temp:   Resp: 18    Intake/Output Summary (Last 24 hours) at 01/15/14 1537 Last data filed at 01/15/14 1457  Gross per 24 hour  Intake   1290 ml  Output      0 ml  Net   1290 ml   Filed Weights   01/11/14 1352 01/15/14 0528  Weight: 65.3 kg (143 lb 15.4 oz) 64.3 kg (141 lb 12.1 oz)    Exam:   General:  Intubated, on a ventilator  Cardiovascular: s1, s2, rrr  Respiratory: Occasional rhonchi, no wheezing  Abdomen: Soft, nontender, positive bowel sounds  Musculoskeletal: no edema b/l   Data Reviewed: Basic Metabolic Panel:  Recent Labs Lab 01/11/14 1343 01/12/14 0629 01/13/14 0550 01/14/14 0617 01/15/14 0620  NA 130* 132* 135* 135* 136*  K 3.9 4.0 3.9 3.8 4.0  CL 92* 97 99 101 101  CO2 25 25 24 22 22   GLUCOSE 119* 95 106* 121* 98  BUN 17 16 12 12 12   CREATININE 0.97 0.92 0.87 0.91 1.02  CALCIUM 8.8 8.5 8.6 8.9 9.1   Liver Function Tests:  Recent Labs Lab 01/11/14 1343 01/12/14 0629 01/13/14 0550 01/14/14 0617 01/15/14 0620  AST 57* 48* 73* 81* 97*  ALT 79* 63* 67* 74* 88*  ALKPHOS 409* 392* 494* 624* 766*  BILITOT 5.7* 4.2* 4.3* 4.2* 3.9*  PROT 6.4 5.8* 5.7* 5.9* 6.3  ALBUMIN 2.2* 1.9* 1.9* 1.9* 2.1*    Recent Labs Lab 01/11/14 1343  LIPASE 53   No results found for this basename: AMMONIA,   in the last 168 hours CBC:  Recent Labs Lab 01/11/14 1343 01/12/14 0629 01/13/14 0550 01/14/14 0617 01/15/14 0620  WBC 4.4 2.8* 2.9* 3.1* 4.6  HGB 9.0* 8.0* 8.4* 8.6* 8.8*  HCT 27.4* 25.7* 26.0* 26.6* 27.7*  MCV 91.6 94.5 91.9 91.7 92.3  PLT 169 145* 162 203 234   Cardiac Enzymes: No results found for this basename: CKTOTAL, CKMB, CKMBINDEX, TROPONINI,  in the last 168 hours BNP (last 3 results)  Recent Labs  02/10/13 1655  PROBNP 193.2*   CBG: No results found for this basename: GLUCAP,  in the last 168 hours  Recent Results (from the past 240 hour(s))  CULTURE, BLOOD (ROUTINE X 2)     Status: None   Collection Time    01/11/14 10:38 PM      Result Value Ref Range Status   Specimen Description BLOOD RIGHT WRIST   Final   Special Requests BOTTLES DRAWN AEROBIC AND ANAEROBIC 8CC   Final   Culture NO GROWTH 3 DAYS   Final   Report Status PENDING   Incomplete  CULTURE, BLOOD (ROUTINE X 2)     Status: None   Collection Time    01/11/14 10:42 PM      Result Value Ref Range Status   Specimen Description LEFT ANTECUBITAL   Final   Special Requests BOTTLES DRAWN AEROBIC AND ANAEROBIC 6CC   Final   Culture NO GROWTH 3 DAYS   Final   Report Status PENDING   Incomplete     Studies: Mr Abdomen Mrcp Wo Cm  01/13/2014   CLINICAL DATA:  Upper abdominal pain and nausea for 1 week. Patient has a history of peritoneal carcinomatosis.  EXAM: MRI ABDOMEN WITHOUT CONTRAST  (INCLUDING MRCP)  TECHNIQUE: Multiplanar multisequence MR imaging of the abdomen was performed. Heavily T2-weighted images of the biliary and pancreatic ducts were obtained, and three-dimensional MRCP images were rendered by post processing.  COMPARISON:  CT scan 11/19/2013.  FINDINGS: Examination is quite limited due to patient motion/breathing artifact. Lack of IV contrast is also limiting.  There is new intrahepatic biliary dilatation. The common bile duct measures a maximum of 10 mm in the porta hepatis but is  normal in the head of the pancreas. There is a fairly abrupt transition zone. Please see series 4 image number 21. No obvious pancreatic head mass or acute pancreatitis. No definite common bile duct stones. There is a small duodenal diverticulum near the pancreatic head but no obvious compressive mass. No ampullary lesion is identified. This could be due to peritoneal seeding and extrinsic compression.  No focal liver lesions. The portal vein demonstrates patent flow voids. The gallbladder is distended. No definite gallstones.  The spleen is upper limits of normal in size. No focal lesions. The adrenal glands and left kidney are normal. The right kidney demonstrates new hydronephrosis. There is also upper right hydroureter. This was not present on the CT scan from 11/19/2013. I do see a small lower pole right renal calculus on the CT scan from 08/29/2013 and it is possible this is due to a obstructing ureteral calculus.  There are scattered upper abdominal lymph nodes No obvious mass or bulky adenopathy.  No significant bony findings.  IMPRESSION: New intrahepatic and common bile duct dilatation with an abrupt transition to a normal or small caliber common bile duct in the head of the pancreas. No pancreatic head mass, common bile duct stones or obvious changes of acute pancreatitis to account for this. Common bile duct tumor or extrinsic compression by peritoneal carcinomatosis is possible. ERCP may be helpful for diagnostic and therapeutic purposes.  New right-sided hydroureteronephrosis possibly due to obstructing ureteral calculus. A followup noncontrast CT scan of the abdomen/ pelvis may be helpful for further evaluation.   Electronically Signed   By: Kalman Jewels M.D.   On: 01/13/2014 17:36   Mr 3d Recon At Scanner  01/13/2014   CLINICAL DATA:  Upper abdominal pain and nausea for 1 week. Patient has a history of peritoneal carcinomatosis.  EXAM: MRI ABDOMEN WITHOUT CONTRAST  (INCLUDING MRCP)  TECHNIQUE:  Multiplanar multisequence MR imaging of the abdomen was performed. Heavily T2-weighted images of the biliary and pancreatic ducts were obtained, and three-dimensional MRCP images were rendered by post processing.  COMPARISON:  CT scan 11/19/2013.  FINDINGS: Examination is quite limited due to patient motion/breathing artifact. Lack of IV contrast is also limiting.  There is new intrahepatic biliary dilatation. The common bile duct measures a maximum of 10 mm in the porta hepatis but is normal in the head of the pancreas. There is a fairly abrupt transition zone. Please see series 4 image number 21. No obvious pancreatic head mass or acute pancreatitis. No definite common bile duct stones. There is a small duodenal diverticulum near the pancreatic head but no obvious compressive mass. No ampullary lesion is identified. This could be due to peritoneal seeding and extrinsic compression.  No focal liver lesions. The portal vein demonstrates patent flow voids. The gallbladder is distended. No definite gallstones.  The spleen is upper limits of normal in size. No focal lesions. The adrenal glands and left kidney are normal. The right kidney demonstrates new hydronephrosis. There is also upper right hydroureter. This was not present on the CT scan from 11/19/2013. I do see a small lower pole right renal calculus on the CT scan from 08/29/2013 and it is possible this is due to a obstructing ureteral calculus.  There are scattered upper abdominal lymph nodes No obvious mass or bulky adenopathy.  No significant bony findings.  IMPRESSION: New intrahepatic and common bile duct dilatation with an abrupt transition to a normal or small caliber common bile duct in the head of the pancreas. No pancreatic head mass, common bile duct stones or obvious changes of acute pancreatitis to account for this. Common bile duct tumor or extrinsic compression by peritoneal carcinomatosis is possible. ERCP may be helpful for diagnostic and  therapeutic purposes.  New right-sided hydroureteronephrosis possibly due to obstructing ureteral calculus. A followup noncontrast CT scan of the abdomen/ pelvis may be helpful for further evaluation.   Electronically Signed   By: Kalman Jewels M.D.   On: 01/13/2014 17:36   Dg Chest Port 1 View  01/15/2014   CLINICAL DATA:  Respiratory distress, acute  EXAM: PORTABLE CHEST - 1 VIEW  COMPARISON:  January 11, 2014  FINDINGS: There is now diffuse interstitial edema. Heart is enlarged with mild pulmonary venous hypertension. There is a questionable mass in the right upper lobe medially measuring approximately 3.0 x 2.5 cm. There is no convincing adenopathy. Patient is status post coronary artery bypass grafting. Port-A-Cath tip is in the superior cava. No pneumothorax.  IMPRESSION: Congestive heart failure, new.  Suspect  mass right upper lobe medially. Advise noncontrast enhanced chest CT to further evaluate.  These results will be called to the ordering clinician or representative by the Radiologist Assistant, and communication documented in the PACS or zVision Dashboard.   Electronically Signed   By: Lowella Grip M.D.   On: 01/15/2014 14:45   Dg Ercp With Sphincterotomy  01/15/2014   CLINICAL DATA:  History of upper abdominal pain and nausea. History of peritoneal carcinomatosis.  EXAM: ERCP  TECHNIQUE: Multiple spot images obtained with the fluoroscopic device and submitted for interpretation post-procedure.  COMPARISON:  MRI 01/13/2014  FINDINGS: Contrast injections demonstrate dilatation of the intrahepatic biliary system and common hepatic duct. Contrast drains into the gallbladder. There is a long segment narrowing of the common bile duct. Wire was advanced into the intrahepatic biliary system. The common bile duct was dilated with a balloon. Evidence for brush biopsy. A metallic stent was placed in the common hepatic duct and common bile duct.  IMPRESSION: Narrowing of the common bile duct with  dilatation of the common hepatic duct and intrahepatic ducts.  Placement of metallic biliary stent.  These images were submitted for radiologic interpretation only. Please see the procedural report for the amount of contrast and the fluoroscopy time utilized.   Electronically Signed   By: Markus Daft M.D.   On: 01/15/2014 14:18    Scheduled Meds: . antiseptic oral rinse  7 mL Mouth Rinse QID  . aspirin  81 mg Oral Daily  . chlorhexidine  15 mL Mouth Rinse BID  . DULoxetine  30 mg Oral Daily  . feeding supplement (RESOURCE BREEZE)  1 Container Oral TID BM  . ipratropium-albuterol  3 mL Nebulization Q4H  . isosorbide mononitrate  60 mg Oral Daily  . metoCLOPramide  5 mg Oral TID AC & HS  . oxymetazoline  2 spray Each Nare BID  . pantoprazole (PROTONIX) IV  40 mg Intravenous Daily  . piperacillin-tazobactam (ZOSYN)  IV  3.375 g Intravenous Q8H  . predniSONE  5 mg Oral Q breakfast  . sodium chloride       Continuous Infusions: . sodium chloride 50 mL/hr at 01/15/14 1008    Principal Problem:   Elevated LFTs Active Problems:   Cancer of unknown origin   Dehydration   Nausea and vomiting   Chronic pain syndrome   Benign essential HTN   Protein-calorie malnutrition, severe    Time spent: 48mins    MEMON,JEHANZEB  [facility] Pager [phone]. If 7PM-7AM, please contact night-coverage at [email or web address], password [facility] 01/15/2014, 3:37 PM  LOS: 4 days

## 2014-01-15 NOTE — Anesthesia Postprocedure Evaluation (Signed)
  Anesthesia Post-op Note  Patient: Erika Moreno  Procedure(s) Performed: Procedure(s): ENDOSCOPIC RETROGRADE CHOLANGIOPANCREATOGRAPHY (ERCP) SPHINCTEROTOMY BILIARY WALL STENT PLACEMENT BALLOON DILATION  Patient Location: PACU and ICU  Anesthesia Type:General  Level of Consciousness: sedated  Airway and Oxygen Therapy: Patient re-intubated  Post-op Pain: none  Post-op Assessment: Post-op Vital signs reviewed and Patient's Cardiovascular Status Stable  Post-op Vital Signs: Reviewed and stable, 134/85, 96,on vent and sats 95.  Last Vitals:  Filed Vitals:   01/15/14 1510  BP: 122/77  Pulse: 103  Temp:   Resp: 18    Complications: Patient re-intubatedPatient reintubated in PACU prior to transferring to ICU.

## 2014-01-15 NOTE — Transfer of Care (Addendum)
Immediate Anesthesia Transfer of Care Note  Patient: Erika Moreno  Procedure(s) Performed: Procedure(s): ENDOSCOPIC RETROGRADE CHOLANGIOPANCREATOGRAPHY (ERCP) SPHINCTEROTOMY BILIARY WALL STENT PLACEMENT BALLOON DILATION  Patient Location: PACU  Anesthesia Type:General  Level of Consciousness: lethargic and obtunded  Airway & Oxygen Therapy: Patient Spontanous Breathing, Increased work of breathing with sats low 90's, respiratory present to set up bipap.  Post-op Assessment: Report given to PACU RN  Post vital signs: Reviewed  Complications: Preparing for possible reintubation.

## 2014-01-15 NOTE — Progress Notes (Signed)
[name] Progress Note Patient Name: Erika Moreno DOB: 10/19/1945 MRN: [id number]   Date of Service  01/15/2014  HPI/Events of Note   [name] is a 68 y.o. female with history of stage IV cancer of unknown primary, with peritoneal carcinomatosis who is followed at [facility]. She presents to [facility] emergency room after being increasingly lethargic, generally weak and having poor by mouth intake for several days. Patient reports that she was recently started on a fentanyl patch for chronic pain management and since that time she was increasingly lethargic. She also admits to having nausea and vomiting over the past several days. 10/13 had ERCP with biliary sphincterotomy, biliary stricture balloon dilation, cytology brushing and expandable wall stent placement performed. Transferred to the ICU for further monitoring and observation/  eICU Interventions  1. Respiratory Failure - on mechanical ventilation, wean as tolerate - cont with gentle diuresis, monitor HCO3 levels (avoid contraction alkalosis)  2. Right perihilar infiltrate/mass - possible rounded atelectasis\PNA - f\u CXR with improvement - mass not present on CT chest from March 2015 - pulmonary consulted - can be followed with cxrs for now  Restart DVT prophylaxis unless contraindicated.          Abdifatah Colquhoun 01/15/2014, 4:54 PM

## 2014-01-15 NOTE — Progress Notes (Signed)
Pt not responding to treatment. Resp therapy in to see pt. Dr Patsey Berthold in to see pt. Placed on vent. Per anesthesia.

## 2014-01-16 ENCOUNTER — Inpatient Hospital Stay (HOSPITAL_COMMUNITY): Payer: PRIVATE HEALTH INSURANCE

## 2014-01-16 ENCOUNTER — Encounter (HOSPITAL_COMMUNITY): Payer: Self-pay | Admitting: Internal Medicine

## 2014-01-16 ENCOUNTER — Ambulatory Visit (HOSPITAL_COMMUNITY): Payer: PRIVATE HEALTH INSURANCE

## 2014-01-16 DIAGNOSIS — K830 Cholangitis: Principal | ICD-10-CM

## 2014-01-16 DIAGNOSIS — K838 Other specified diseases of biliary tract: Secondary | ICD-10-CM

## 2014-01-16 DIAGNOSIS — G9340 Encephalopathy, unspecified: Secondary | ICD-10-CM

## 2014-01-16 DIAGNOSIS — T85590A Other mechanical complication of bile duct prosthesis, initial encounter: Secondary | ICD-10-CM

## 2014-01-16 DIAGNOSIS — J9601 Acute respiratory failure with hypoxia: Secondary | ICD-10-CM

## 2014-01-16 LAB — COMPREHENSIVE METABOLIC PANEL
ALT: 75 U/L — ABNORMAL HIGH (ref 0–35)
AST: 64 U/L — ABNORMAL HIGH (ref 0–37)
Albumin: 2.1 g/dL — ABNORMAL LOW (ref 3.5–5.2)
Alkaline Phosphatase: 800 U/L — ABNORMAL HIGH (ref 39–117)
Anion gap: 19 — ABNORMAL HIGH (ref 5–15)
BILIRUBIN TOTAL: 2.4 mg/dL — AB (ref 0.3–1.2)
BUN: 14 mg/dL (ref 6–23)
CHLORIDE: 99 meq/L (ref 96–112)
CO2: 19 meq/L (ref 19–32)
Calcium: 9 mg/dL (ref 8.4–10.5)
Creatinine, Ser: 1.09 mg/dL (ref 0.50–1.10)
GFR, EST AFRICAN AMERICAN: 59 mL/min — AB (ref >90)
GFR, EST NON AFRICAN AMERICAN: 51 mL/min — AB (ref >90)
Glucose, Bld: 131 mg/dL — ABNORMAL HIGH (ref 70–99)
Potassium: 4 mEq/L (ref 3.7–5.3)
Sodium: 137 mEq/L (ref 137–147)
Total Protein: 6.4 g/dL (ref 6.0–8.3)

## 2014-01-16 LAB — CBC
HEMATOCRIT: 29.4 % — AB (ref 36.0–46.0)
Hemoglobin: 9.3 g/dL — ABNORMAL LOW (ref 12.0–15.0)
MCH: 29.2 pg (ref 26.0–34.0)
MCHC: 31.6 g/dL (ref 30.0–36.0)
MCV: 92.5 fL (ref 78.0–100.0)
Platelets: 281 10*3/uL (ref 150–400)
RBC: 3.18 MIL/uL — ABNORMAL LOW (ref 3.87–5.11)
RDW: 19 % — ABNORMAL HIGH (ref 11.5–15.5)
WBC: 8.2 10*3/uL (ref 4.0–10.5)

## 2014-01-16 LAB — BLOOD GAS, ARTERIAL
ACID-BASE DEFICIT: 5.4 mmol/L — AB (ref 0.0–2.0)
Bicarbonate: 18.6 mEq/L — ABNORMAL LOW (ref 20.0–24.0)
Drawn by: 317771
FIO2: 0.4 %
O2 SAT: 98 %
PCO2 ART: 31.3 mmHg — AB (ref 35.0–45.0)
PEEP: 5 cmH2O
Patient temperature: 37
RATE: 15 resp/min
TCO2: 17.2 mmol/L (ref 0–100)
VT: 450 mL
pH, Arterial: 7.392 (ref 7.350–7.450)
pO2, Arterial: 122 mmHg — ABNORMAL HIGH (ref 80.0–100.0)

## 2014-01-16 LAB — CULTURE, BLOOD (ROUTINE X 2)
Culture: NO GROWTH
Culture: NO GROWTH

## 2014-01-16 MED ORDER — SIMETHICONE 40 MG/0.6ML PO SUSP
ORAL | Status: AC
  Filled 2014-01-16: qty 0.6

## 2014-01-16 MED ORDER — ENSURE COMPLETE PO LIQD
237.0000 mL | Freq: Two times a day (BID) | ORAL | Status: DC
  Administered 2014-01-16: 16:00:00 via ORAL
  Administered 2014-01-17: 1 via ORAL

## 2014-01-16 MED ORDER — IPRATROPIUM-ALBUTEROL 0.5-2.5 (3) MG/3ML IN SOLN: 3.0000 mL | RESPIRATORY_TRACT | Status: DC | PRN

## 2014-01-16 NOTE — Progress Notes (Signed)
PROGRESS NOTE  Erika Moreno:[id number] DOB: [birth year] DOA: 01/11/2014 PCP: [name], MD Oncology Dr. Barnet Glasgow  Summary: 80XBJ with h/o stage IV cancer of unknown primary, direct admit to [facility] from [facility] for lethargy, n/v/, fever and hyperbilirubinemia. US gallbladder with sludge and dilated hepatobiliary tree. MRCP with intrahepatic and CBD dilatation with abrupt transition to a normal/small caliber CBD in head of pancreas. CBD tumor or extrinsic compression by peritoneal carcinomatosis as differentials. S/p ERCP 10/13   Assessment/Plan: 1. VDRF s/p ERCP. CXR suggested CHF but did not respond to Lasix.  Etiology unclear, no s/s of infection. No evidence of pneumonia, infiltrate on CXR present on admission. 2. Acute pulmonary edema. Etiology unclear, wonder if secondary to fluids in OR. Resolved.  3. Possible cholangitis. Fever resolved. Continue abx.  4. CBD dilitation, stricture with jaundice, s/p ERCP with stent placement 10/13. 5. Possible lung mass. Seen on admit CXR 10/9. F/u chest CT in future. No evidence to suggest pneumonia. 6. Acute encephalopathy secondary to Fentanyl patch. Dose adjusted by oncology. 7. N/v, dehydration. Resolved.  8. Normocytic anemia likely secondary to chemotherapy, chronic disease. 9. History of stage IV ovarian carcinoma, with peritoneal carcinomatosis; last chemotherapy 3 weeks ago. 10. Chronic pain, anxiety, depression. Oncology decreased fentanyl patch to 12/5; increased roxicodone.  11. H/o CP. Resumed Plavix if no procedures.    Appears improved s/p extubation. Plan advance to clear liquids, remove NGT and foley.  Continue empiric Zosyn  Recommend chest CT as outpatient.    CMP in AM  Code Status: full code DVT prophylaxis: SCDs Family Communication: discussed with family at bedise Disposition Plan: home  Murray Hodgkins, MD  [facility]  Pager [phone] If 7PM-7AM, please contact night-coverage at [email or web address],  password [facility] 01/16/2014, 8:57 AM  LOS: 5 days   Consultants:  General surgery  GI  Oncology   Pulmonology  Procedures:  10/13 ERCP IMPRESSION: Long critical biliary stricture with marked upstream dilation of the biliary tree status post biliary sphincterotomy and balloon stricture dilation, followed by brushing for cytology and expandable metallic stent placement as described above.  ETT 10/13 >> 10/14  Antibiotics:  Zosyn 10/10 >>   HPI/Subjective: S/p ERCP 10/13 followed by VDRF. Extubated this AM. Feeling much better, wants NGT out. No SOB.  Objective: Filed Vitals:   01/16/14 0649 01/16/14 0700 01/16/14 0758 01/16/14 0800  BP:  115/69  108/69  Pulse:  98  103  Temp:   98.9 F (37.2 C)   TempSrc:   Axillary   Resp:  17  30  Height:      Weight:      SpO2: 99% 99%  97%    Intake/Output Summary (Last 24 hours) at 01/16/14 0857 Last data filed at 01/16/14 0750  Gross per 24 hour  Intake 1828.21 ml  Output   1700 ml  Net 128.21 ml     Filed Weights   01/11/14 1352 01/15/14 0528  Weight: 65.3 kg (143 lb 15.4 oz) 64.3 kg (141 lb 12.1 oz)    Exam:     Afebrile, VSS  General: appears calm and comfortable  Psych: alert, speech fluent and clear  CV: RRR no m/r/g. No LE edema.  Respiratory: CTA bilaterally no w/r/r. Normal resp effort.  Abdomen: soft  Data Reviewed:  ABG noted  CMP noted, AST, ALT trending down  Hgb stable 9.3  CXR with RUL opacity, sig unclear, noted 10/9.  Scheduled Meds: . antiseptic oral rinse  7 mL Mouth Rinse QID  .  aspirin  81 mg Oral Daily  . chlorhexidine  15 mL Mouth Rinse BID  . Chlorhexidine Gluconate Cloth  6 each Topical Q0600  . DULoxetine  30 mg Oral Daily  . feeding supplement (RESOURCE BREEZE)  1 Container Oral TID BM  . furosemide  20 mg Intravenous BID  . ipratropium-albuterol  3 mL Nebulization Q4H  . isosorbide mononitrate  60 mg Oral Daily  . metoCLOPramide  5 mg Oral TID AC & HS  . mupirocin  ointment  1 application Nasal BID  . oxymetazoline  2 spray Each Nare BID  . pantoprazole (PROTONIX) IV  40 mg Intravenous Daily  . piperacillin-tazobactam (ZOSYN)  IV  3.375 g Intravenous Q8H  . predniSONE  5 mg Oral Q breakfast  . simethicone       Continuous Infusions: . sodium chloride 10 mL/hr at 01/15/14 1557  . fentaNYL infusion INTRAVENOUS Stopped (01/16/14 1410)    Principal Problem:   Dilated cbd, acquired Active Problems:   Cancer of unknown origin   Cholangitis   Dehydration   Nausea and vomiting   Chronic pain syndrome   Benign essential HTN   Protein-calorie malnutrition, severe   Acute respiratory failure with hypoxia   Acute encephalopathy   Time spent 25 minutes

## 2014-01-16 NOTE — Progress Notes (Signed)
UR chart review completed.  

## 2014-01-16 NOTE — Procedures (Signed)
Extubation Procedure Note  Patient Details:   Name: MIASIA CRABTREE DOB: 11/21/1945 MRN: [id number]   Airway Documentation:  Airway (Active)     Airway 7 mm (Active)  Secured at (cm) 24 cm 01/16/2014  6:49 AM  Measured From Lips 01/16/2014  6:49 AM  [place] 01/16/2014  6:49 AM  Secured By [employer] 01/16/2014  6:49 AM  Tube Holder Repositioned Yes 01/16/2014  6:49 AM  Cuff Pressure (cm H2O) 24 cm H2O 01/15/2014  7:27 PM  Site Condition Dry 01/16/2014  6:49 AM    Nif: -40, VC 1.1 liters.  Extubated per Dr. Luan Pulling orders.  Tolerated well, patient now on 4 lpm nasal canula.  Evaluation  O2 sats: stable throughout Complications: No apparent complications Patient did tolerate procedure well. Bilateral Breath Sounds: Clear Suctioning: Airway Yes  Oren Bracket 01/16/2014, 8:03 AM

## 2014-01-16 NOTE — Progress Notes (Addendum)
Subjective: Just extubated. Right-sided abdominal pain, mild nausea. No vomiting. NG tube in place.   Objective: Vital signs in last 24 hours: Temp:  [97.7 F (36.5 C)-98.7 F (37.1 C)] 98.7 F (37.1 C) (10/14 0400) Pulse Rate:  [70-103] 91 (10/14 0600) Resp:  [16-40] 17 (10/14 0600) BP: (95-160)/(64-95) 110/76 mmHg (10/14 0600) SpO2:  [86 %-100 %] 99 % (10/14 0649) FiO2 (%):  [40 %-100 %] 40 % (10/14 0649) Last BM Date: 01/14/14 General:   Alert and oriented Head:  Normocephalic and atraumatic. Eyes:  Mild icterus Abdomen:  Bowel sounds present, soft, TTP RUQ, non-distended. No HSM or hernias noted.  Extremities:  Pedal edema Neurologic:  Alert and  oriented to person, nods head appropriately Skin:  Much improved jaundice   Intake/Output from previous day: 10/13 0701 - 10/14 0700 In: 1598.2 [I.V.:1248.2; IV Piggyback:350] Out: 1700 [Urine:1700] Intake/Output this shift:    Lab Results:  Recent Labs  01/14/14 0617 01/15/14 0620 01/16/14 0544  WBC 3.1* 4.6 8.2  HGB 8.6* 8.8* 9.3*  HCT 26.6* 27.7* 29.4*  PLT 203 234 281   BMET  Recent Labs  01/14/14 0617 01/15/14 0620 01/16/14 0544  NA 135* 136* 137  K 3.8 4.0 4.0  CL 101 101 99  CO2 22 22 19   GLUCOSE 121* 98 131*  BUN 12 12 14   CREATININE 0.91 1.02 1.09  CALCIUM 8.9 9.1 9.0   LFT  Recent Labs  01/14/14 0617 01/15/14 0620 01/16/14 0544  PROT 5.9* 6.3 6.4  ALBUMIN 1.9* 2.1* 2.1*  AST 81* 97* 64*  ALT 74* 88* 75*  ALKPHOS 624* 766* 800*  BILITOT 4.2* 3.9* 2.4*     Studies/Results: Portable Chest Xray  01/15/2014   CLINICAL DATA:  Endotracheal tube placement.  Pulmonary edema.  EXAM: PORTABLE CHEST - 1 VIEW 3:51 p.m.  COMPARISON:  Chest x-rays dated 01/15/2014 at 2:31 p.m. and 01/11/2014  FINDINGS: Endotracheal tube has been inserted and is in good position. Power port in place. Bilateral pulmonary edema has improved. There is persistent increased density at the left lung base which could  represent atelectasis or infiltrate.  There is a persistent right perihilar infiltrate.  IMPRESSION: ET tube in good position. Improved pulmonary edema. Persistent right perihilar infiltrate and left base infiltrate or atelectasis.   Electronically Signed   By: Rozetta Nunnery M.D.   On: 01/15/2014 16:14   Dg Chest Port 1 View  01/15/2014   CLINICAL DATA:  Respiratory distress, acute  EXAM: PORTABLE CHEST - 1 VIEW  COMPARISON:  January 11, 2014  FINDINGS: There is now diffuse interstitial edema. Heart is enlarged with mild pulmonary venous hypertension. There is a questionable mass in the right upper lobe medially measuring approximately 3.0 x 2.5 cm. There is no convincing adenopathy. Patient is status post coronary artery bypass grafting. Port-A-Cath tip is in the superior cava. No pneumothorax.  IMPRESSION: Congestive heart failure, new.  Suspect mass right upper lobe medially. Advise noncontrast enhanced chest CT to further evaluate.  These results will be called to the ordering clinician or representative by the Radiologist Assistant, and communication documented in the PACS or zVision Dashboard.   Electronically Signed   By: Lowella Grip M.D.   On: 01/15/2014 14:45   Dg Chest Port 1v Same Day  01/15/2014   CLINICAL DATA:  Respiratory failure following ERCP, he ETT adjustment  EXAM: PORTABLE CHEST - 1 VIEW SAME DAY  COMPARISON:  01/15/2014 at 1551 hr  FINDINGS: Endotracheal tube terminates 3  cm above the carina.  Right upper lobe opacity, suspicious for pneumonia. No pleural effusion or pneumothorax.  Cardiomegaly.  Postsurgical changes related to prior CABG.  Prominent right perihilar opacity, underlying adenopathy not excluded.  Right chest power port terminating in the mid SVC.  IMPRESSION: Endotracheal tube terminates 3 cm above the carina.  Right upper lobe opacity, suspicious for pneumonia.   Electronically Signed   By: Julian Hy M.D.   On: 01/15/2014 18:53   Dg Ercp With  Sphincterotomy  01/15/2014   CLINICAL DATA:  History of upper abdominal pain and nausea. History of peritoneal carcinomatosis.  EXAM: ERCP  TECHNIQUE: Multiple spot images obtained with the fluoroscopic device and submitted for interpretation post-procedure.  COMPARISON:  MRI 01/13/2014  FINDINGS: Contrast injections demonstrate dilatation of the intrahepatic biliary system and common hepatic duct. Contrast drains into the gallbladder. There is a long segment narrowing of the common bile duct. Wire was advanced into the intrahepatic biliary system. The common bile duct was dilated with a balloon. Evidence for brush biopsy. A metallic stent was placed in the common hepatic duct and common bile duct.  IMPRESSION: Narrowing of the common bile duct with dilatation of the common hepatic duct and intrahepatic ducts.  Placement of metallic biliary stent.  These images were submitted for radiologic interpretation only. Please see the procedural report for the amount of contrast and the fluoroscopy time utilized.   Electronically Signed   By: Markus Daft M.D.   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 Respiratory failure after anesthesia requiring intubation; however, she was just extubated this morning and tolerated well. Transaminases and bilirubin continue to slowly improve. Persistent abdominal pain, which is likely multifactorial secondary to advanced disease. Continue to follow closely. '  Question of right pulmonary mass: CT chest in future per admitting physicians.   Plan: Continue PPI  Continue Zosyn Follow LFTs to baseline May have clear liquids when able to tolerate  po Will continue to follow with you  Orvil Feil, ANP-BC [facility]     LOS: 5 days    01/16/2014, 7:48 AM    Attending note  Patient feeling much better this afternoon. Tolerating clear liquid lunch very well. She is hungry. She wants some regular food to eat. Check LFTs in the morning. Will advance diet.

## 2014-01-16 NOTE — Addendum Note (Signed)
Addendum created 01/16/14 1121 by Mickel Baas, CRNA   Modules edited: Notes Section   Notes Section:  File: [id number]

## 2014-01-16 NOTE — Progress Notes (Signed)
Nutrition Follow-up   INTERVENTION: Ensure Complete po BID, each supplement provides 350 kcal and 13 grams of protein   NUTRITION DIAGNOSIS: Predicted sub optimal nutrient intake related to increased nutrition needs due to cancer dx; ongoing.  Goal: Pt will meet >90% of estimated nutritional needs; not met  Monitor:  PO/supplement intake, labs, weight changes, I/O's   68 y.o. female  Admitting Dx: Dilated cbd, acquired  ASSESSMENT: Pt with hx of stage IV ovarian cancer, currently receiving chemotherapy at [facility].  She was transferred to [facility] from [facility] ED. Pt has been experiencing n/v. Currently receiving IV fluids.  MD in with pt and multiple family members at time of visit. Unable to complete nutrition focused physical exam at this time.  Wt hx reveals modest wt loss over the past year, however, no significant for time period. Suspect inadequate oral intake PTA due to n/v and increased nutritional needs due to cancer treatment.  Pt s/p ERCP with Biliary sphincterotomy, balloon stricture  dilation, stent placement 10/13.  She was able to be successfully extubated this morning and diet has been advanced.  Labs reviewed. Glucose: 130  Height: Ht Readings from Last 1 Encounters:  01/15/14 $RemoveB'5\' 4"'xoZASnZH$  (1.626 m)    Weight: Wt Readings from Last 1 Encounters:  01/15/14 141 lb 12.1 oz (64.3 kg)    Ideal Body Weight: 120#   Wt Readings from Last 50 Encounters:  01/15/14 141 lb 12.1 oz (64.3 kg)  01/15/14 141 lb 12.1 oz (64.3 kg)  01/15/14 141 lb 12.1 oz (64.3 kg)  01/02/14 140 lb 12.8 oz (63.866 kg)  12/19/13 140 lb 9.6 oz (63.776 kg)  12/12/13 141 lb (63.957 kg)  11/26/13 141 lb 8 oz (64.184 kg)  11/05/13 140 lb 4.8 oz (63.64 kg)  10/08/13 138 lb 9.6 oz (62.869 kg)  09/17/13 141 lb 6.4 oz (64.139 kg)  09/10/13 140 lb 9.6 oz (63.776 kg)  08/31/13 143 lb 1.6 oz (64.91 kg)  08/20/13 142 lb (64.411 kg)  07/30/13 144 lb 1.6 oz (65.363 kg)  06/18/13 141 lb 6.4 oz (64.139  kg)  05/28/13 144 lb 12.8 oz (65.681 kg)  05/07/13 146 lb 3.2 oz (66.316 kg)  04/04/13 155 lb 3.2 oz (70.398 kg)  03/27/13 152 lb 9.6 oz (69.219 kg)  03/13/13 158 lb 6.4 oz (71.85 kg)  02/21/13 162 lb (73.483 kg)  02/14/13 165 lb (74.844 kg)  02/10/13 160 lb (72.576 kg)  01/24/13 161 lb (73.029 kg)  01/03/13 155 lb 3.2 oz (70.398 kg)  12/12/12 152 lb 6.4 oz (69.128 kg)  11/30/12 147 lb (66.679 kg)  11/22/12 146 lb 9.6 oz (66.497 kg)  11/21/12 149 lb 9.6 oz (67.858 kg)  11/15/12 146 lb 9.6 oz (66.497 kg)   Usual Body Weight: 155#  BMI:  Body mass index is 24.32 kg/(m^2). Normal weight range  Estimated Nutritional Needs: Kcal: 6226-3335 Protein: 85-95 grams  Fluid: 2.0-2.3 L  Skin: No issues noted   Diet Order: Cardiac  EDUCATION NEEDS: -Education not appropriate at this time   Intake/Output Summary (Last 24 hours) at 01/16/14 1419 Last data filed at 01/16/14 1000  Gross per 24 hour  Intake 1068.21 ml  Output   2350 ml  Net -1281.79 ml    Last BM: 10/12  Labs:   Recent Labs Lab 01/14/14 0617 01/15/14 0620 01/16/14 0544  NA 135* 136* 137  K 3.8 4.0 4.0  CL 101 101 99  CO2 $Re'22 22 19  'DAa$ BUN $R'12 12 14  'Ge$ CREATININE 0.91 1.02  1.09  CALCIUM 8.9 9.1 9.0  GLUCOSE 121* 98 131*    CBG (last 3)  No results found for this basename: GLUCAP,  in the last 72 hours  Scheduled Meds: . antiseptic oral rinse  7 mL Mouth Rinse QID  . aspirin  81 mg Oral Daily  . chlorhexidine  15 mL Mouth Rinse BID  . Chlorhexidine Gluconate Cloth  6 each Topical Q0600  . DULoxetine  30 mg Oral Daily  . feeding supplement (RESOURCE BREEZE)  1 Container Oral TID BM  . ipratropium-albuterol  3 mL Nebulization Q4H  . isosorbide mononitrate  60 mg Oral Daily  . metoCLOPramide  5 mg Oral TID AC & HS  . mupirocin ointment  1 application Nasal BID  . oxymetazoline  2 spray Each Nare BID  . pantoprazole (PROTONIX) IV  40 mg Intravenous Daily  . piperacillin-tazobactam (ZOSYN)  IV  3.375 g  Intravenous Q8H  . predniSONE  5 mg Oral Q breakfast  . simethicone        Continuous Infusions: . sodium chloride 10 mL/hr at 01/15/14 1557    Past Medical History  Diagnosis Date  . 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 Cervical cancer 1984  . Myocardial infarction 20 yrs ago  . Coronary artery disease   . Anxiety   . Chronic back pain   . Depression   . GERD (gastroesophageal reflux disease)   . Hypertension   . Hypothyroidism   . Anemia   . Vitamin B 12 deficiency   . Vitamin D deficiency disease     Past Surgical History  Procedure Laterality Date  . Heart bypass      triple bypass with stents  . Coronary artery bypass graft      3 vessels  . Cardiac catheterization      stents x3  . Abdominal hysterectomy      partial  . Portacath placement Right 12/06/2012    Procedure: INSERTION PORT-A-CATH;  Surgeon: Donato Heinz, MD;  Location: [facility];  Service: General;  Laterality: Right;  . Ercp  01/15/2014    Procedure: ENDOSCOPIC RETROGRADE CHOLANGIOPANCREATOGRAPHY (ERCP);  Surgeon: Daneil Dolin, MD;  Location: [facility];  Service: Gastroenterology;;  . Sphincterotomy  01/15/2014    Procedure: Joan Mayans;  Surgeon: Daneil Dolin, MD;  Location: [facility];  Service: Gastroenterology;;  . Biliary stent placement  01/15/2014    Procedure: BILIARY WALL STENT PLACEMENT;  Surgeon: Daneil Dolin, MD;  Location: [facility];  Service: Gastroenterology;;  . Balloon dilation  01/15/2014    Procedure: [place]ie [place];  Surgeon: Daneil Dolin, MD;  Location: [facility];  Service: Gastroenterology;;    Colman Cater MS,RD,CSG,LDN Office: [phone] Pager: [phone]

## 2014-01-16 NOTE — Progress Notes (Signed)
Subjective: She is much improved this morning. She is awake and alert and she meets all weaning criteria.  Objective: Vital signs in last 24 hours: Temp:  [97.7 F (36.5 C)-98.9 F (37.2 C)] 98.9 F (37.2 C) (10/14 0758) Pulse Rate:  [70-103] 103 (10/14 0800) Resp:  [16-40] 30 (10/14 0800) BP: (95-160)/(64-95) 108/69 mmHg (10/14 0800) SpO2:  [86 %-100 %] 97 % (10/14 0800) FiO2 (%):  [32 %-100 %] 32 % (10/14 0819) Weight change:  Last BM Date: 01/14/14  Intake/Output from previous day: 10/13 0701 - 10/14 0700 In: 1598.2 [I.V.:1248.2; IV Piggyback:350] Out: 1700 [Urine:1700]  PHYSICAL EXAM General appearance: alert, cooperative and no distress Resp: clear to auscultation bilaterally Cardio: regular rate and rhythm, S1, S2 normal, no murmur, click, rub or gallop GI: Mildly tender right upper quadrant Extremities: extremities normal, atraumatic, no cyanosis or edema  Lab Results:  Results for orders placed during the hospital encounter of 01/11/14 (from the past 48 hour(s))  VITAMIN B12     Status: None   Collection Time    01/14/14  8:56 AM      Result Value Ref Range   Vitamin B-12 660  211 - 911 pg/mL   Comment: Performed at [facility]     Status: None   Collection Time    01/14/14  8:56 AM      Result Value Ref Range   Folate 6.5     Comment: (NOTE)     Reference Ranges            Deficient:       0.4 - 3.3 ng/mL            Indeterminate:   3.4 - 5.4 ng/mL            Normal:              > 5.4 ng/mL     Performed at [facility]     Status: Abnormal   Collection Time    01/14/14  8:56 AM      Result Value Ref Range   Iron 38 (*) 42 - 135 ug/dL   TIBC 173 (*) 250 - 470 ug/dL   Saturation Ratios 22  20 - 55 %   UIBC 135  125 - 400 ug/dL   Comment: Performed at [facility]     Status: Abnormal   Collection Time    01/14/14  8:56 AM      Result Value Ref Range   Ferritin 2635 (*) 10 - 291 ng/mL   Comment: (NOTE)     Result confirmed by automatic dilution.     Result repeated and verified.     Performed at [facility]     Status: Abnormal   Collection Time    01/14/14  8:56 AM      Result Value Ref Range   Retic Ct Pct 0.9  0.4 - 3.1 %   RBC. 2.95 (*) 3.87 - 5.11 MIL/uL   Retic Count, Manual 26.6  19.0 - 186.0 K/uL  CBC     Status: Abnormal   Collection Time    01/15/14  6:20 AM      Result Value Ref Range   WBC 4.6  4.0 - 10.5 K/uL   RBC 3.00 (*) 3.87 - 5.11 MIL/uL   Hemoglobin 8.8 (*) 12.0 - 15.0 g/dL   HCT 27.7 (*) 36.0 - 46.0 %  MCV 92.3  78.0 - 100.0 fL   MCH 29.3  26.0 - 34.0 pg   MCHC 31.8  30.0 - 36.0 g/dL   RDW 94.1 (*) 65.0 - 81.3 %   Platelets 234  150 - 400 K/uL  COMPREHENSIVE METABOLIC PANEL     Status: Abnormal   Collection Time    01/15/14  6:20 AM      Result Value Ref Range   Sodium 136 (*) 137 - 147 mEq/L   Potassium 4.0  3.7 - 5.3 mEq/L   Chloride 101  96 - 112 mEq/L   CO2 22  19 - 32 mEq/L   Glucose, Bld 98  70 - 99 mg/dL   BUN 12  6 - 23 mg/dL   Creatinine, Ser 6.53  0.50 - 1.10 mg/dL   Calcium 9.1  8.4 - 11.3 mg/dL   Total Protein 6.3  6.0 - 8.3 g/dL   Albumin 2.1 (*) 3.5 - 5.2 g/dL   AST 97 (*) 0 - 37 U/L   ALT 88 (*) 0 - 35 U/L   Alkaline Phosphatase 766 (*) 39 - 117 U/L   Total Bilirubin 3.9 (*) 0.3 - 1.2 mg/dL   GFR calc non Af Amer 55 (*) >90 mL/min   GFR calc Af Amer 64 (*) >90 mL/min   Comment: (NOTE)     The eGFR has been calculated using the CKD EPI equation.     This calculation has not been validated in all clinical situations.     eGFR's persistently <90 mL/min signify possible Chronic Kidney     Disease.   Anion gap 13  5 - 15  BLOOD GAS, ARTERIAL     Status: Abnormal   Collection Time    01/15/14  4:30 PM      Result Value Ref Range   FIO2 100.00     Delivery systems VENTILATOR     Mode PRESSURE REGULATED VOLUME CONTROL     VT 450     Rate 15     Peep/cpap 5.0     pH, Arterial 7.387  7.350  - 7.450   pCO2 arterial 31.6 (*) 35.0 - 45.0 mmHg   pO2, Arterial 111.0 (*) 80.0 - 100.0 mmHg   Bicarbonate 18.5 (*) 20.0 - 24.0 mEq/L   TCO2 17.0  0 - 100 mmol/L   Acid-base deficit 5.6 (*) 0.0 - 2.0 mmol/L   O2 Saturation 97.6     Patient temperature 37.0     Collection site RIGHT BRACHIAL     Drawn by [phone]     Sample type ARTERIAL     Allens test (pass/fail) PASS  PASS  MRSA PCR SCREENING     Status: Abnormal   Collection Time    01/15/14  4:43 PM      Result Value Ref Range   MRSA by PCR POSITIVE (*) NEGATIVE   Comment:            The GeneXpert MRSA Assay (FDA     approved for NASAL specimens     only), is one component of a     comprehensive MRSA colonization     surveillance program. It is not     intended to diagnose MRSA     infection nor to guide or     monitor treatment for     MRSA infections.     RESULT CALLED TO, READ BACK BY AND VERIFIED WITH:     SCHONEITZ,L ON 01/15/2014 AT 1830 BY ISLEY,B  BLOOD GAS, ARTERIAL     Status: Abnormal   Collection Time    01/16/14  5:00 AM      Result Value Ref Range   FIO2 0.40     Delivery systems VENTILATOR     Mode PRESSURE REGULATED VOLUME CONTROL     VT 450     Rate 15.0     Peep/cpap 5.0     pH, Arterial 7.392  7.350 - 7.450   pCO2 arterial 31.3 (*) 35.0 - 45.0 mmHg   pO2, Arterial 122.0 (*) 80.0 - 100.0 mmHg   Bicarbonate 18.6 (*) 20.0 - 24.0 mEq/L   TCO2 17.2  0 - 100 mmol/L   Acid-base deficit 5.4 (*) 0.0 - 2.0 mmol/L   O2 Saturation 98.0     Patient temperature 37.0     Collection site RIGHT RADIAL     Drawn by [id number]     Sample type ARTERIAL DRAW     Allens test (pass/fail) PASS  PASS  CBC     Status: Abnormal   Collection Time    01/16/14  5:44 AM      Result Value Ref Range   WBC 8.2  4.0 - 10.5 K/uL   RBC 3.18 (*) 3.87 - 5.11 MIL/uL   Hemoglobin 9.3 (*) 12.0 - 15.0 g/dL   HCT 29.4 (*) 36.0 - 46.0 %   MCV 92.5  78.0 - 100.0 fL   MCH 29.2  26.0 - 34.0 pg   MCHC 31.6  30.0 - 36.0 g/dL   RDW 19.0  (*) 11.5 - 15.5 %   Platelets 281  150 - 400 K/uL  COMPREHENSIVE METABOLIC PANEL     Status: Abnormal   Collection Time    01/16/14  5:44 AM      Result Value Ref Range   Sodium 137  137 - 147 mEq/L   Potassium 4.0  3.7 - 5.3 mEq/L   Chloride 99  96 - 112 mEq/L   CO2 19  19 - 32 mEq/L   Glucose, Bld 131 (*) 70 - 99 mg/dL   BUN 14  6 - 23 mg/dL   Creatinine, Ser 1.09  0.50 - 1.10 mg/dL   Calcium 9.0  8.4 - 10.5 mg/dL   Total Protein 6.4  6.0 - 8.3 g/dL   Albumin 2.1 (*) 3.5 - 5.2 g/dL   AST 64 (*) 0 - 37 U/L   ALT 75 (*) 0 - 35 U/L   Alkaline Phosphatase 800 (*) 39 - 117 U/L   Total Bilirubin 2.4 (*) 0.3 - 1.2 mg/dL   GFR calc non Af Amer 51 (*) >90 mL/min   GFR calc Af Amer 59 (*) >90 mL/min   Comment: (NOTE)     The eGFR has been calculated using the CKD EPI equation.     This calculation has not been validated in all clinical situations.     eGFR's persistently <90 mL/min signify possible Chronic Kidney     Disease.   Anion gap 19 (*) 5 - 15    ABGS  Recent Labs  01/16/14 0500  PHART 7.392  PO2ART 122.0*  TCO2 17.2  HCO3 18.6*   CULTURES Recent Results (from the past 240 hour(s))  CULTURE, BLOOD (ROUTINE X 2)     Status: None   Collection Time    01/11/14 10:38 PM      Result Value Ref Range Status   Specimen Description BLOOD RIGHT WRIST   Final   Special Requests BOTTLES  DRAWN AEROBIC AND ANAEROBIC 8CC   Final   Culture NO GROWTH 3 DAYS   Final   Report Status PENDING   Incomplete  CULTURE, BLOOD (ROUTINE X 2)     Status: None   Collection Time    01/11/14 10:42 PM      Result Value Ref Range Status   Specimen Description LEFT ANTECUBITAL   Final   Special Requests BOTTLES DRAWN AEROBIC AND ANAEROBIC 6CC   Final   Culture NO GROWTH 3 DAYS   Final   Report Status PENDING   Incomplete  MRSA PCR SCREENING     Status: Abnormal   Collection Time    01/15/14  4:43 PM      Result Value Ref Range Status   MRSA by PCR POSITIVE (*) NEGATIVE Final   Comment:             The GeneXpert MRSA Assay (FDA     approved for NASAL specimens     only), is one component of a     comprehensive MRSA colonization     surveillance program. It is not     intended to diagnose MRSA     infection nor to guide or     monitor treatment for     MRSA infections.     RESULT CALLED TO, READ BACK BY AND VERIFIED WITH:     SCHONEITZ,L ON 01/15/2014 AT 1830 BY ISLEY,B    Studies/Results: Portable Chest Xray  01/15/2014   CLINICAL DATA:  Endotracheal tube placement.  Pulmonary edema.  EXAM: PORTABLE CHEST - 1 VIEW 3:51 p.m.  COMPARISON:  Chest x-rays dated 01/15/2014 at 2:31 p.m. and 01/11/2014  FINDINGS: Endotracheal tube has been inserted and is in good position. Power port in place. Bilateral pulmonary edema has improved. There is persistent increased density at the left lung base which could represent atelectasis or infiltrate.  There is a persistent right perihilar infiltrate.  IMPRESSION: ET tube in good position. Improved pulmonary edema. Persistent right perihilar infiltrate and left base infiltrate or atelectasis.   Electronically Signed   By: Rozetta Nunnery M.D.   On: 01/15/2014 16:14   Dg Chest Port 1 View  01/15/2014   CLINICAL DATA:  Respiratory distress, acute  EXAM: PORTABLE CHEST - 1 VIEW  COMPARISON:  January 11, 2014  FINDINGS: There is now diffuse interstitial edema. Heart is enlarged with mild pulmonary venous hypertension. There is a questionable mass in the right upper lobe medially measuring approximately 3.0 x 2.5 cm. There is no convincing adenopathy. Patient is status post coronary artery bypass grafting. Port-A-Cath tip is in the superior cava. No pneumothorax.  IMPRESSION: Congestive heart failure, new.  Suspect mass right upper lobe medially. Advise noncontrast enhanced chest CT to further evaluate.  These results will be called to the ordering clinician or representative by the Radiologist Assistant, and communication documented in the PACS or zVision  Dashboard.   Electronically Signed   By: Lowella Grip M.D.   On: 01/15/2014 14:45   Dg Chest Port 1v Same Day  01/15/2014   CLINICAL DATA:  Respiratory failure following ERCP, he ETT adjustment  EXAM: PORTABLE CHEST - 1 VIEW SAME DAY  COMPARISON:  01/15/2014 at 1551 hr  FINDINGS: Endotracheal tube terminates 3 cm above the carina.  Right upper lobe opacity, suspicious for pneumonia. No pleural effusion or pneumothorax.  Cardiomegaly.  Postsurgical changes related to prior CABG.  Prominent right perihilar opacity, underlying adenopathy not excluded.  Right chest power port  terminating in the mid SVC.  IMPRESSION: Endotracheal tube terminates 3 cm above the carina.  Right upper lobe opacity, suspicious for pneumonia.   Electronically Signed   By: Julian Hy M.D.   On: 01/15/2014 18:53   Dg Ercp With Sphincterotomy  01/15/2014   CLINICAL DATA:  History of upper abdominal pain and nausea. History of peritoneal carcinomatosis.  EXAM: ERCP  TECHNIQUE: Multiple spot images obtained with the fluoroscopic device and submitted for interpretation post-procedure.  COMPARISON:  MRI 01/13/2014  FINDINGS: Contrast injections demonstrate dilatation of the intrahepatic biliary system and common hepatic duct. Contrast drains into the gallbladder. There is a long segment narrowing of the common bile duct. Wire was advanced into the intrahepatic biliary system. The common bile duct was dilated with a balloon. Evidence for brush biopsy. A metallic stent was placed in the common hepatic duct and common bile duct.  IMPRESSION: Narrowing of the common bile duct with dilatation of the common hepatic duct and intrahepatic ducts.  Placement of metallic biliary stent.  These images were submitted for radiologic interpretation only. Please see the procedural report for the amount of contrast and the fluoroscopy time utilized.   Electronically Signed   By: Markus Daft M.D.   On: 01/15/2014 14:18    Medications:  Prior to  Admission:  Prescriptions prior to admission  Medication Sig Dispense Refill  . ALPRAZolam (XANAX) 0.25 MG tablet Take 1 tablet (0.25 mg total) by mouth every 6 (six) hours as needed for anxiety.  60 tablet  2  . aspirin 81 MG tablet Take 81 mg by mouth daily.      . clopidogrel (PLAVIX) 75 MG tablet Take 75 mg by mouth daily.      . cyclobenzaprine (FLEXERIL) 10 MG tablet Take 10 mg by mouth 3 (three) times daily as needed for muscle spasms.      . diphenhydrAMINE (NIGHT TIME SLEEP AID) 25 MG tablet Take 25 mg by mouth at bedtime as needed for sleep.      . DULoxetine (CYMBALTA) 30 MG capsule Take 1 capsule (30 mg total) by mouth daily.  30 capsule  6  . furosemide (LASIX) 20 MG tablet Take 20 mg by mouth daily.       . hydrOXYzine (ATARAX/VISTARIL) 10 MG tablet Take 10 mg by mouth every 8 (eight) hours as needed (pruitus).       . isosorbide mononitrate (IMDUR) 60 MG 24 hr tablet Take 60 mg by mouth daily.       [name] lisinopril-hydrochlorothiazide (PRINZIDE,ZESTORETIC) 10-12.5 MG per tablet Take 1 tablet by mouth daily.       . magnesium hydroxide (MILK OF MAGNESIA) 400 MG/5ML suspension Take 30 mLs by mouth daily as needed for mild constipation.      . metoCLOPramide (REGLAN) 5 MG tablet Take 5 mg 3-4 times a day before meals and at bedtime to help with abdominal bloating.  100 tablet  3  . Multiple Vitamins-Minerals (HAIR/SKIN/NAILS PO) Take 1 tablet by mouth daily.      . nitroGLYCERIN (NITROSTAT) 0.6 MG SL tablet Place 0.4 mg under the tongue every 5 (five) minutes as needed for chest pain.       [name] ondansetron (ZOFRAN) 8 MG tablet Take 1 tablet by mouth every 8 hours as needed for nausea and vomiting  45 tablet  0  . Oxycodone HCl 10 MG TABS Take 1 or 2 tablets every 2-4 hours as needed for pain.  120 tablet  0  . potassium  chloride SA (K-DUR,KLOR-CON) 20 MEQ tablet Take 1 tablet (20 mEq total) by mouth 2 (two) times daily.  60 tablet  3  . predniSONE (DELTASONE) 5 MG tablet Take 5 mg by mouth  daily with breakfast.       . prochlorperazine (COMPAZINE) 10 MG tablet Take 1 tablet (10 mg total) by mouth every 6 (six) hours as needed for nausea or vomiting.  60 tablet  2  . senna-docusate (SENOKOT-S) 8.6-50 MG per tablet Take 3 tablets by mouth at bedtime.      [name] everolimus (AFINITOR) 5 MG tablet Take 1 tablet (5 mg total) by mouth daily.  21 tablet  6  . fentaNYL (DURAGESIC - DOSED MCG/HR) 25 MCG/HR patch Place 1 patch (25 mcg total) onto the skin every 3 (three) days.  5 patch  0   Scheduled: . antiseptic oral rinse  7 mL Mouth Rinse QID  . aspirin  81 mg Oral Daily  . chlorhexidine  15 mL Mouth Rinse BID  . Chlorhexidine Gluconate Cloth  6 each Topical Q0600  . DULoxetine  30 mg Oral Daily  . feeding supplement (RESOURCE BREEZE)  1 Container Oral TID BM  . furosemide  20 mg Intravenous BID  . ipratropium-albuterol  3 mL Nebulization Q4H  . isosorbide mononitrate  60 mg Oral Daily  . metoCLOPramide  5 mg Oral TID AC & HS  . mupirocin ointment  1 application Nasal BID  . oxymetazoline  2 spray Each Nare BID  . pantoprazole (PROTONIX) IV  40 mg Intravenous Daily  . piperacillin-tazobactam (ZOSYN)  IV  3.375 g Intravenous Q8H  . predniSONE  5 mg Oral Q breakfast  . simethicone       Continuous: . sodium chloride 10 mL/hr at 01/15/14 1557  . 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 She had sphincterotomy and balloon stricture dilatation brushing for cytology and a stent was placed. She required ventilator support after the procedure but this morning is much improved and ready for extubation Principal Problem:   Elevated LFTs Active Problems:    Cancer of unknown origin   Dehydration   Nausea and vomiting   Chronic pain syndrome   Benign essential HTN   Protein-calorie malnutrition, severe    Plan: extubate today    LOS: 5 days   Lurlean Kernen L 01/16/2014, 8:37 AM

## 2014-01-16 NOTE — Progress Notes (Signed)
[facility] Progress Note Patient Name: DAVEAH VARONE DOB: [birth year] MRN: [id number]   Date of Service  01/16/2014  HPI/Events of Note    eICU Interventions  SCDs for DVT proph     Intervention Category Intermediate Interventions: Best-practice therapies (e.g. DVT, beta blocker, etc.)  Jaimi Belle V. 01/16/2014, 12:27 AM

## 2014-01-16 NOTE — Consult Note (Signed)
NAMEMIYONNA, ORMISTON NO.:  [id number]  MEDICAL RECORD NO.:  [id number]  LOCATION:  IC09                          FACILITY:  [facility]  PHYSICIAN:  Thurmon Mizell L. Luan Pulling, M.D.DATE OF BIRTH:  1945/08/03  DATE OF CONSULTATION:  01/15/2014 DATE OF DISCHARGE:                                CONSULTATION   HISTORY OF PRESENT ILLNESS:  Ms. Greenfield is a 68 year old who was admitted with ovarian cancer.  It was initially thought to be of unknown primary; now, has been diagnosed as ovarian cancer with peritoneal carcinomatosis.  She has been followed at the [facility]. She underwent ERCP with dilatation and stent placement yesterday and had respiratory difficulty postoperatively and required ventilator support. When I saw her last night in the evening, she was able to speak around the endotracheal tube.  So, I advanced it by 2 cm and had her further sedated.  At that time, she was on 100% oxygen.  So, we did not have any opportunity to try to get her off the ventilator.  She has been receiving chemotherapy at the [facility].  PAST MEDICAL HISTORY:  Past medical history shows the ovarian cancer. Previous history of cervical cancer in 1984.  Coronary artery occlusive disease with myocardial infarction about 20 years ago.  Chronic low back pain.  Chronic anxiety and depression.  GERD.  Hypertension. Hypothyroidism.  She has had coronary artery bypass grafting and had stents as well.  She has had an abdominal hysterectomy and a Port-A-Cath placement.  SOCIAL HISTORY:  She had about a 40 pack-a-year smoking history but stopped 25 years ago.  She does not use any alcohol or illicit drugs.  FAMILY HISTORY:  Her family history is very positive for heart disease but not known to be positive for lung disease.  MEDICATIONS:  At home, she has been on Xanax 0.25 every 6 hours as needed for anxiety, aspirin 81 mg daily, Plavix 75 mg daily, Flexeril 10 mg  t.i.d., over the counter Benadryl sleep aid 25 mg as needed, Cymbalta 30 mg daily, Lasix 20 mg daily, Vistaril 10 mg every 8 hours as needed for itching, Imdur 60 mg daily, lisinopril/hydrochlorothiazide 10/12.5 daily, milk of magnesia as needed, multiple vitamins, nitroglycerin 0.4 as needed which she has not taken for sometime, Zofran 8 mg tablets every 8 hours as needed for nausea and vomiting, oxycodone 10 mg 1 or 2 every 2 hours as needed for pain, potassium chloride 20 mEq b.i.d., prednisone 5 mg daily, Compazine as needed for nausea, Senokot S 3 tablets at bedtime, Afinitor 5 mg daily, fentanyl patch 25 mcg every 3 days.  PHYSICAL EXAMINATION:  GENERAL:  Shows a alert female with an endotracheal tube in place, but she is able to speak around it. HEENT:  Her pupils are reactive.  Nose and throat are clear.  Mucous membranes are somewhat dry. CHEST:  She has a midline sternotomy scar.  Her chest shows some rhonchi. HEART:  Regular without gallop. ABDOMEN:  Mildly tender in the right upper quadrant.  Bowel sounds are present. EXTREMITIES:  Showed no edema. CENTRAL NERVOUS SYSTEM:  She is moving all 4 extremities.  ASSESSMENT AND PLAN:  She is on 100% oxygen.  She is intubated but is able to speak around the endotracheal tube.  I advanced the endotracheal tube, although on the last chest x-ray, it appeared to be in adequate position, and I am going to go ahead and sedate her further.  No other changes.  She does have what appears to be a right lung mass which is presumably related to her primary diagnosis.     Iver Miklas L. Luan Pulling, M.D.     ELH/MEDQ  D:  01/16/2014  T:  01/16/2014  Job:  [id number]

## 2014-01-16 NOTE — Anesthesia Postprocedure Evaluation (Signed)
  Anesthesia Post-op Note  Patient: Erika Moreno  Procedure(s) Performed: Procedure(s): ENDOSCOPIC RETROGRADE CHOLANGIOPANCREATOGRAPHY (ERCP) SPHINCTEROTOMY BILIARY WALL STENT PLACEMENT BALLOON DILATION  Patient Location: ICU  Anesthesia Type:General  Level of Consciousness: awake, alert , oriented and patient cooperative  Airway and Oxygen Therapy: Patient Spontanous Breathing and Patient connected to nasal cannula oxygen  Post-op Pain: mild  Post-op Assessment: Post-op Vital signs reviewed, Patient's Cardiovascular Status Stable, Respiratory Function Stable, Patent Airway and NAUSEA AND VOMITING PRESENT  Post-op Vital Signs: Reviewed and stable  Last Vitals:  Filed Vitals:   01/16/14 0800  BP: 108/69  Pulse: 103  Temp:   Resp: 30    Complications: Patient required reintubation in PACU yesterday; PCXR showed pulmonary edema;  was extubated this morning; doing well on nasal cannula; mild nausea reported; NGT in place

## 2014-01-17 ENCOUNTER — Other Ambulatory Visit: Payer: Self-pay | Admitting: Gastroenterology

## 2014-01-17 ENCOUNTER — Inpatient Hospital Stay (HOSPITAL_COMMUNITY): Payer: PRIVATE HEALTH INSURANCE

## 2014-01-17 ENCOUNTER — Telehealth: Payer: Self-pay | Admitting: Gastroenterology

## 2014-01-17 ENCOUNTER — Other Ambulatory Visit: Payer: Self-pay

## 2014-01-17 DIAGNOSIS — R945 Abnormal results of liver function studies: Principal | ICD-10-CM

## 2014-01-17 DIAGNOSIS — R7989 Other specified abnormal findings of blood chemistry: Secondary | ICD-10-CM

## 2014-01-17 LAB — COMPREHENSIVE METABOLIC PANEL
ALT: 51 U/L — AB (ref 0–35)
AST: 30 U/L (ref 0–37)
Albumin: 2.1 g/dL — ABNORMAL LOW (ref 3.5–5.2)
Alkaline Phosphatase: 641 U/L — ABNORMAL HIGH (ref 39–117)
Anion gap: 15 (ref 5–15)
BILIRUBIN TOTAL: 1.9 mg/dL — AB (ref 0.3–1.2)
BUN: 15 mg/dL (ref 6–23)
CHLORIDE: 96 meq/L (ref 96–112)
CO2: 25 meq/L (ref 19–32)
CREATININE: 1.07 mg/dL (ref 0.50–1.10)
Calcium: 8.8 mg/dL (ref 8.4–10.5)
GFR calc Af Amer: 60 mL/min — ABNORMAL LOW (ref >90)
GFR calc non Af Amer: 52 mL/min — ABNORMAL LOW (ref >90)
Glucose, Bld: 127 mg/dL — ABNORMAL HIGH (ref 70–99)
Potassium: 3.5 mEq/L — ABNORMAL LOW (ref 3.7–5.3)
Sodium: 136 mEq/L — ABNORMAL LOW (ref 137–147)
Total Protein: 6.2 g/dL (ref 6.0–8.3)

## 2014-01-17 MED ORDER — FUROSEMIDE 20 MG PO TABS
20.0000 mg | ORAL_TABLET | Freq: Every day | ORAL | Status: DC
  Administered 2014-01-17: 20 mg via ORAL
  Filled 2014-01-17: qty 1

## 2014-01-17 MED ORDER — HEPARIN SOD (PORK) LOCK FLUSH 100 UNIT/ML IV SOLN: 500.0000 [IU] | INTRAVENOUS | Status: DC | PRN

## 2014-01-17 MED ORDER — AMOXICILLIN-POT CLAVULANATE 875-125 MG PO TABS: 1.0000 | ORAL_TABLET | Freq: Two times a day (BID) | ORAL | Status: DC

## 2014-01-17 MED ORDER — HEPARIN SOD (PORK) LOCK FLUSH 100 UNIT/ML IV SOLN
500.0000 [IU] | INTRAVENOUS | Status: DC
  Administered 2014-01-17: 500 [IU]
  Filled 2014-01-17: qty 5

## 2014-01-17 MED ORDER — AMOXICILLIN-POT CLAVULANATE 875-125 MG PO TABS
1.0000 | ORAL_TABLET | Freq: Two times a day (BID) | ORAL | Status: DC
  Administered 2014-01-17: 1 via ORAL
  Filled 2014-01-17 (×3): qty 1

## 2014-01-17 MED ORDER — CLOPIDOGREL BISULFATE 75 MG PO TABS: 75.0000 mg | ORAL_TABLET | Freq: Every day | ORAL | Status: DC

## 2014-01-17 NOTE — Progress Notes (Signed)
D/c instructions reviewed with patient and family.  Verbalized understanding.  Pt dc'd to home with family. Schonewitz, Eulis Canner 01/17/2014

## 2014-01-17 NOTE — Discharge Summary (Signed)
Physician Discharge Summary  Erika Moreno DPT:[id number] DOB: [birth year] DOA: 01/11/2014  PCP: [name], MD  Admit date: 01/11/2014 Discharge date: 01/17/2014  Recommendations for Outpatient Follow-up:  1. F/u resolution of cholangitis and possible pneumonia, see below. consider outpatient chest CT to follow-up. 2. Right-sided hydronephrosis of unclear etiology likely secondary to acute illness. See discussion below. Consider repeat imaging as an outpatient 3. Chronic pain    Follow-up Information   Follow up with [name], MD In 1 week.   Specialty:  Family Medicine   Contact information:   [address] [place] [address] [phone]       Follow up with KEFALAS,THOMAS, PA-C. (office will call you for appointment)    Specialty:  Physician Assistant   Contact information:   [place] [address] [phone]       Follow up with Manus Rudd, MD. (office will contact your for appointment)    Specialty:  Gastroenterology   Contact information:   [address] [place] [address] [phone]      Discharge Diagnoses:  1. Suspected cholangitis 2. CBD stricture with obstructive jaundice 3. S/p ERCP with stent placement 4. Possible pneumonia 5. Possible lung mass 6. Vent dependent respiratory failure post-procedure 7. Acute encephalopathy secondary to Fentanyl patch 8. Normocytic anemia secondary to chemotherapy 9. History of stage IV ovarian carcinoma.  Discharge Condition: imprvoed Disposition: home  Diet recommendation: heart healthy  Filed Weights   01/11/14 1352 01/15/14 0528  Weight: 65.3 kg (143 lb 15.4 oz) 64.3 kg (141 lb 12.1 oz)    History of present illness:  67yow with h/o stage IV cancer of unknown primary, direct admit to [facility] from [facility] for lethargy, n/v/, fever and hyperbilirubinemia.   Hospital Course:  Treated empirically for cholangitis. Subsequent workup with [place] and MRCP demonstrated intrahepatic and  CBD dilatation with abrupt transition to a normal/small caliber CBD in head of pancreas. CBD tumor or extrinsic compression by peritoneal carcinomatosis as differentials. Seen by GI and underwent ERCP as below. Post-procedure resp failure requiring <24 hours of mechanical ventilation likely secondary to volume overload. Easily weaned. LFTs improving post-procedure and now stable for discharge. See below.  1. VDRF s/p ERCP. Possible pneumonia, infiltrate on CXR present on admission. Possibly secondary to volume overload. No evidence of CHF. 2. Acute pulmonary edema. Likely secondary to fluids in OR. Resolved.  3. Suspected cholangitis. Fever resolved <24 hours after admission. LFTs continue to improved. 5 more days abx per GI. 4. CBD dilitation, stricture with jaundice, s/p ERCP with stent placement 10/13. LFTs continue to improve. 5. Possible pneumonia, possible lung mass. Seen on admit CXR 10/9. Most recent CXR with improvement, suggest f/u chest CT in future.  6. Acute encephalopathy on admission secondary to Fentanyl patch. Resolved. Patient refuses this medication. 7. N/v, dehydration. Resolved.  8. Normocytic anemia likely secondary to chemotherapy, chronic disease. Remains stable.  9. History of stage IV ovarian carcinoma, with peritoneal carcinomatosis; last chemotherapy 3 weeks ago. 10. Chronic pain, anxiety, depression. Stable. Oncology plans to continue roxicodone.  11. Right-sided hydronephrosis, seen on MRCP. U/S shows improvement, now only mild. No right-sided pain. No dysuria. Voiding well. No s/s to suggest stone; favor secondary to acute illness, f/u as an outpatient to resolution  Much improved, plan discharge home today, clear per GI. Finish abx for suspected cholangitis and possible pneumonia.  Resume Plavix after 7 days post-procedure per discussion with Dr. Gala Romney today.  Discussed with T. Sheldon Silvan, will arrange f/u  with in 1 week with Cancer center, rec hold everolimus.  Discussed  with family at bedside including right hydronephrosis   Consultants:  General surgery  GI  Oncology  Pulmonology Procedures:  10/13 ERCP IMPRESSION: Long critical biliary stricture with marked upstream dilation of the biliary tree status post biliary sphincterotomy and balloon stricture dilation, followed by brushing for cytology and expandable metallic stent placement as described above.  ETT 10/13 >> 10/14 Antibiotics:  Zosyn 10/10 >> 10/15  Augmentin 10/15 >> 10/19  Discharge Instructions  Discharge Instructions   Activity as tolerated - No restrictions    Complete by:  As directed      Diet - low sodium heart healthy    Complete by:  As directed      Discharge instructions    Complete by:  As directed   Call your physician or seek immediate medical attention for abdominal pain, vomiting, shortness of breath, fever or worsening of condition.          Current Discharge Medication List    START taking these medications   Details  amoxicillin-clavulanate (AUGMENTIN) 875-125 MG per tablet Take 1 tablet by mouth every 12 (twelve) hours. Qty: 9 tablet, Refills: 0      CONTINUE these medications which have CHANGED   Details  clopidogrel (PLAVIX) 75 MG tablet Take 1 tablet (75 mg total) by mouth daily. Restart October 20.      CONTINUE these medications which have NOT CHANGED   Details  aspirin 81 MG tablet Take 81 mg by mouth daily.   Associated Diagnoses: Cancer of unknown origin; Chemotherapy-induced nausea    cyclobenzaprine (FLEXERIL) 10 MG tablet Take 10 mg by mouth 3 (three) times daily as needed for muscle spasms.    diphenhydrAMINE (NIGHT TIME SLEEP AID) 25 MG tablet Take 25 mg by mouth at bedtime as needed for sleep.    DULoxetine (CYMBALTA) 30 MG capsule Take 1 capsule (30 mg total) by mouth daily. Qty: 30 capsule, Refills: 6    furosemide (LASIX) 20 MG tablet Take 20 mg by mouth daily.     hydrOXYzine (ATARAX/VISTARIL) 10 MG tablet Take 10 mg by mouth  every 8 (eight) hours as needed (pruitus).     isosorbide mononitrate (IMDUR) 60 MG 24 hr tablet Take 60 mg by mouth daily.     magnesium hydroxide (MILK OF MAGNESIA) 400 MG/5ML suspension Take 30 mLs by mouth daily as needed for mild constipation.    metoCLOPramide (REGLAN) 5 MG tablet Take 5 mg 3-4 times a day before meals and at bedtime to help with abdominal bloating. Qty: 100 tablet, Refills: 3    Multiple Vitamins-Minerals (HAIR/SKIN/NAILS PO) Take 1 tablet by mouth daily.    nitroGLYCERIN (NITROSTAT) 0.6 MG SL tablet Place 0.4 mg under the tongue every 5 (five) minutes as needed for chest pain.     ondansetron (ZOFRAN) 8 MG tablet Take 1 tablet by mouth every 8 hours as needed for nausea and vomiting Qty: 45 tablet, Refills: 0    Oxycodone HCl 10 MG TABS Take 1 or 2 tablets every 2-4 hours as needed for pain. Qty: 120 tablet, Refills: 0    potassium chloride SA (K-DUR,KLOR-CON) 20 MEQ tablet Take 1 tablet (20 mEq total) by mouth 2 (two) times daily. Qty: 60 tablet, Refills: 3    predniSONE (DELTASONE) 5 MG tablet Take 5 mg by mouth daily with breakfast.     prochlorperazine (COMPAZINE) 10 MG tablet Take 1 tablet (10 mg total) by mouth every  6 (six) hours as needed for nausea or vomiting. Qty: 60 tablet, Refills: 2   Associated Diagnoses: Cancer of unknown origin; Anemia due to chemotherapy    senna-docusate (SENOKOT-S) 8.6-50 MG per tablet Take 3 tablets by mouth at bedtime.      STOP taking these medications     ALPRAZolam (XANAX) 0.25 MG tablet      lisinopril-hydrochlorothiazide (PRINZIDE,ZESTORETIC) 10-12.5 MG per tablet      everolimus (AFINITOR) 5 MG tablet      fentaNYL (DURAGESIC - DOSED MCG/HR) 25 MCG/HR patch        Allergies  Allergen Reactions  . Avastin [Bevacizumab] Hives and Other (See Comments)    Took Prednisone along with Avastin to decrease hives  . Morphine And Related Other (See Comments)    Hallucinates with large IV doses.  . Reglan  [Metoclopramide] Other (See Comments)    Hands shaking  . Sulfa Antibiotics Itching    The results of significant diagnostics from this hospitalization (including imaging, microbiology, ancillary and laboratory) are listed below for reference.    Significant Diagnostic Studies: Dg Chest 2 View  01/12/2014   CLINICAL DATA:  Fever.  Confusion.  Initial evaluation.  EXAM: CHEST  2 VIEW  COMPARISON:  Chest CT 8/17 2015.  FINDINGS: Power port catheter tip noted projected over upper superior vena cava. Mediastinum unremarkable. Right hilar fullness noted consistent with adenopathy. Right perihilar infiltrate noted. Subsegmental atelectasis lung bases. Prior CABG. Cardiomegaly with normal pulmonary vascularity. No pleural effusion or pneumothorax. No acute osseus abnormality.  IMPRESSION: 1. Power port catheter with tip projected over superior vena cava. 2. Right hilar fullness suggesting adenopathy. Right perihilar infiltrate noted. 3. Prior CABG.  Cardiomegaly, no CHF.   Electronically Signed   By: Marcello Moores  Register   On: 01/12/2014 02:35   Mr Abdomen Mrcp Wo Cm  01/13/2014   CLINICAL DATA:  Upper abdominal pain and nausea for 1 week. Patient has a history of peritoneal carcinomatosis.  EXAM: MRI ABDOMEN WITHOUT CONTRAST  (INCLUDING MRCP)  TECHNIQUE: Multiplanar multisequence MR imaging of the abdomen was performed. Heavily T2-weighted images of the biliary and pancreatic ducts were obtained, and three-dimensional MRCP images were rendered by post processing.  COMPARISON:  CT scan 11/19/2013.  FINDINGS: Examination is quite limited due to patient motion/breathing artifact. Lack of IV contrast is also limiting.  There is new intrahepatic biliary dilatation. The common bile duct measures a maximum of 10 mm in the porta hepatis but is normal in the head of the pancreas. There is a fairly abrupt transition zone. Please see series 4 image number 21. No obvious pancreatic head mass or acute pancreatitis. No  definite common bile duct stones. There is a small duodenal diverticulum near the pancreatic head but no obvious compressive mass. No ampullary lesion is identified. This could be due to peritoneal seeding and extrinsic compression.  No focal liver lesions. The portal vein demonstrates patent flow voids. The gallbladder is distended. No definite gallstones.  The spleen is upper limits of normal in size. No focal lesions. The adrenal glands and left kidney are normal. The right kidney demonstrates new hydronephrosis. There is also upper right hydroureter. This was not present on the CT scan from 11/19/2013. I do see a small lower pole right renal calculus on the CT scan from 08/29/2013 and it is possible this is due to a obstructing ureteral calculus.  There are scattered upper abdominal lymph nodes No obvious mass or bulky adenopathy.  No significant bony findings.  IMPRESSION: New intrahepatic and common bile duct dilatation with an abrupt transition to a normal or small caliber common bile duct in the head of the pancreas. No pancreatic head mass, common bile duct stones or obvious changes of acute pancreatitis to account for this. Common bile duct tumor or extrinsic compression by peritoneal carcinomatosis is possible. ERCP may be helpful for diagnostic and therapeutic purposes.  New right-sided hydroureteronephrosis possibly due to obstructing ureteral calculus. A followup noncontrast CT scan of the abdomen/ pelvis may be helpful for further evaluation.   Electronically Signed   By: Kalman Jewels M.D.   On: 01/13/2014 17:36   Portable Chest Xray In Am  01/16/2014   CLINICAL DATA:  Respiratory failure and status post extubation. History of cervical carcinoma.  EXAM: PORTABLE CHEST - 1 VIEW  COMPARISON:  01/15/2014  FINDINGS: The patient has been extubated. There remains a right upper lobe infiltrate which shows some decrease in density since the prior chest x-ray. Atelectasis at the right lung base also  improved. No overt edema or pleural fluid. The heart size is stable and mildly enlarged. Stable appearance of Port-A-Cath.  IMPRESSION: Improved aeration of the right lung with some decrease in density of a right upper lobe infiltrate.   Electronically Signed   By: Aletta Edouard M.D.   On: 01/16/2014 16:10   Portable Chest Xray  01/15/2014   CLINICAL DATA:  Endotracheal tube placement.  Pulmonary edema.  EXAM: PORTABLE CHEST - 1 VIEW 3:51 p.m.  COMPARISON:  Chest x-rays dated 01/15/2014 at 2:31 p.m. and 01/11/2014  FINDINGS: Endotracheal tube has been inserted and is in good position. Power port in place. Bilateral pulmonary edema has improved. There is persistent increased density at the left lung base which could represent atelectasis or infiltrate.  There is a persistent right perihilar infiltrate.  IMPRESSION: ET tube in good position. Improved pulmonary edema. Persistent right perihilar infiltrate and left base infiltrate or atelectasis.   Electronically Signed   By: Rozetta Nunnery M.D.   On: 01/15/2014 16:14   Dg Chest Port 1 View  01/15/2014   CLINICAL DATA:  Respiratory distress, acute  EXAM: PORTABLE CHEST - 1 VIEW  COMPARISON:  January 11, 2014  FINDINGS: There is now diffuse interstitial edema. Heart is enlarged with mild pulmonary venous hypertension. There is a questionable mass in the right upper lobe medially measuring approximately 3.0 x 2.5 cm. There is no convincing adenopathy. Patient is status post coronary artery bypass grafting. Port-A-Cath tip is in the superior cava. No pneumothorax.  IMPRESSION: Congestive heart failure, new.  Suspect mass right upper lobe medially. Advise noncontrast enhanced chest CT to further evaluate.  These results will be called to the ordering clinician or representative by the Radiologist Assistant, and communication documented in the PACS or zVision Dashboard.   Electronically Signed   By: Lowella Grip M.D.   On: 01/15/2014 14:45   Dg Chest Port 1v  Same Day  01/15/2014   CLINICAL DATA:  Respiratory failure following ERCP, he ETT adjustment  EXAM: PORTABLE CHEST - 1 VIEW SAME DAY  COMPARISON:  01/15/2014 at 1551 hr  FINDINGS: Endotracheal tube terminates 3 cm above the carina.  Right upper lobe opacity, suspicious for pneumonia. No pleural effusion or pneumothorax.  Cardiomegaly.  Postsurgical changes related to prior CABG.  Prominent right perihilar opacity, underlying adenopathy not excluded.  Right chest power port terminating in the mid SVC.  IMPRESSION: Endotracheal tube terminates 3 cm above the carina.  Right upper lobe  opacity, suspicious for pneumonia.   Electronically Signed   By: Julian Hy M.D.   On: 01/15/2014 18:53   Dg Ercp With Sphincterotomy  01/15/2014   CLINICAL DATA:  History of upper abdominal pain and nausea. History of peritoneal carcinomatosis.  EXAM: ERCP  TECHNIQUE: Multiple spot images obtained with the fluoroscopic device and submitted for interpretation post-procedure.  COMPARISON:  MRI 01/13/2014  FINDINGS: Contrast injections demonstrate dilatation of the intrahepatic biliary system and common hepatic duct. Contrast drains into the gallbladder. There is a long segment narrowing of the common bile duct. Wire was advanced into the intrahepatic biliary system. The common bile duct was dilated with a balloon. Evidence for brush biopsy. A metallic stent was placed in the common hepatic duct and common bile duct.  IMPRESSION: Narrowing of the common bile duct with dilatation of the common hepatic duct and intrahepatic ducts.  Placement of metallic biliary stent.  These images were submitted for radiologic interpretation only. Please see the procedural report for the amount of contrast and the fluoroscopy time utilized.   Electronically Signed   By: Markus Daft M.D.   On: 01/15/2014 14:18   US Abdomen Limited Ruq  01/11/2014   CLINICAL DATA:  Postprandial right upper quadrant abdominal pain over the past 2 weeks. Elevated  liver function tests. Current history of ovarian cancer. Prior history of breast cancer.  EXAM: US ABDOMEN LIMITED - RIGHT UPPER QUADRANT  COMPARISON:  CTA abdomen 11/19/2013, 06/12/2013.  FINDINGS: Gallbladder:  Markedly dilated, measuring approximately 13.9 x 4.1 x 4.3 cm. Large amount of layering echogenic sludge. No shadowing gallstones. No gallbladder wall thickening or pericholecystic fluid. Negative sonographic Murphy sign according to the ultrasound technologist.  Common bile duct:  Diameter: Approximately 14 mm.  Echogenic sludge within the common bile duct. No visible gallstones.  Liver:  Normal size and echotexture without focal parenchymal abnormality. Patent portal vein with hepatopetal flow. Minimal ascites adjacent to the liver.  IMPRESSION: 1. Dilated gallbladder containing a large amount of echogenic sludge. No evidence of cholelithiasis or acute cholecystitis. 2. Dilated common bile duct up to 14 mm, containing echogenic sludge. No visible common bile duct stones. 3. Minimal perihepatic ascites.   Electronically Signed   By: Evangeline Dakin M.D.   On: 01/11/2014 17:55    Microbiology: Recent Results (from the past 240 hour(s))  CULTURE, BLOOD (ROUTINE X 2)     Status: None   Collection Time    01/11/14 10:38 PM      Result Value Ref Range Status   Specimen Description BLOOD RIGHT WRIST   Final   Special Requests BOTTLES DRAWN AEROBIC AND ANAEROBIC 8CC   Final   Culture NO GROWTH 5 DAYS   Final   Report Status 01/16/2014 FINAL   Final  CULTURE, BLOOD (ROUTINE X 2)     Status: None   Collection Time    01/11/14 10:42 PM      Result Value Ref Range Status   Specimen Description LEFT ANTECUBITAL   Final   Special Requests BOTTLES DRAWN AEROBIC AND ANAEROBIC 6CC   Final   Culture NO GROWTH 5 DAYS   Final   Report Status 01/16/2014 FINAL   Final  MRSA PCR SCREENING     Status: Abnormal   Collection Time    01/15/14  4:43 PM      Result Value Ref Range Status   MRSA by PCR  POSITIVE (*) NEGATIVE Final   Comment:  The GeneXpert MRSA Assay (FDA     approved for NASAL specimens     only), is one component of a     comprehensive MRSA colonization     surveillance program. It is not     intended to diagnose MRSA     infection nor to guide or     monitor treatment for     MRSA infections.     RESULT CALLED TO, READ BACK BY AND VERIFIED WITH:     SCHONEITZ,L ON 01/15/2014 AT 1830 BY ISLEY,B      Labs: Basic Metabolic Panel:  Recent Labs Lab 01/13/14 0550 01/14/14 0617 01/15/14 0620 01/16/14 0544 01/17/14 0448  NA 135* 135* 136* 137 136*  K 3.9 3.8 4.0 4.0 3.5*  CL 99 101 101 99 96  CO2 24 22 22 19 25   GLUCOSE 106* 121* 98 131* 127*  BUN 12 12 12 14 15   CREATININE 0.87 0.91 1.02 1.09 1.07  CALCIUM 8.6 8.9 9.1 9.0 8.8   Liver Function Tests:  Recent Labs Lab 01/13/14 0550 01/14/14 0617 01/15/14 0620 01/16/14 0544 01/17/14 0448  AST 73* 81* 97* 64* 30  ALT 67* 74* 88* 75* 51*  ALKPHOS 494* 624* 766* 800* 641*  BILITOT 4.3* 4.2* 3.9* 2.4* 1.9*  PROT 5.7* 5.9* 6.3 6.4 6.2  ALBUMIN 1.9* 1.9* 2.1* 2.1* 2.1*    Recent Labs Lab 01/11/14 1343  LIPASE 53   CBC:  Recent Labs Lab 01/12/14 0629 01/13/14 0550 01/14/14 0617 01/15/14 0620 01/16/14 0544  WBC 2.8* 2.9* 3.1* 4.6 8.2  HGB 8.0* 8.4* 8.6* 8.8* 9.3*  HCT 25.7* 26.0* 26.6* 27.7* 29.4*  MCV 94.5 91.9 91.7 92.3 92.5  PLT 145* 162 203 234 281     Recent Labs  02/10/13 1655  PROBNP 193.2*    Principal Problem:   Dilated cbd, acquired Active Problems:   Cancer of unknown origin   Cholangitis   Dehydration   Nausea and vomiting   Chronic pain syndrome   Benign essential HTN   Protein-calorie malnutrition, severe   Acute respiratory failure with hypoxia   Acute encephalopathy   Time coordinating discharge: 45 minutes  Signed:  Murray Hodgkins, MD [facility] 01/17/2014, 11:36 AM

## 2014-01-17 NOTE — Progress Notes (Signed)
Subjective:  Abdominal pain much improved. "100% better". Still has some lower abdominal discomfort. Tolerated breakfast. No N/V.  Objective: Vital signs in last 24 hours: Temp:  [97.2 F (36.2 C)-98.8 F (37.1 C)] 97.2 F (36.2 C) (10/15 0759) Pulse Rate:  [71-94] 91 (10/15 0700) Resp:  [19-46] 20 (10/15 0700) BP: (109-153)/(66-97) 153/97 mmHg (10/15 0700) SpO2:  [88 %-98 %] 96 % (10/15 0700) Last BM Date: 01/14/14 General:   Alert,  Well-developed, well-nourished, pleasant and cooperative in NAD Head:  Normocephalic and atraumatic. Eyes:  Sclera clear, no icterus.   Abdomen:  Soft, mild distention more in the upper abd. Diffuse mild tenderness.  Normal bowel sounds, without guarding, and without rebound.   Extremities:  Without clubbing, deformity or edema. Several sores with improvement. Neurologic:  Alert and  oriented x4;  grossly normal neurologically. Skin:  Intact without significant lesions or rashes. Psych:  Alert and cooperative. Normal mood and affect.  Intake/Output from previous day: 10/14 0701 - 10/15 0700 In: 3580.5 [P.O.:780; I.V.:2650.5; IV Piggyback:150] Out: 1300 [Urine:1300] Intake/Output this shift:    Lab Results: CBC  Recent Labs  01/15/14 0620 01/16/14 0544  WBC 4.6 8.2  HGB 8.8* 9.3*  HCT 27.7* 29.4*  MCV 92.3 92.5  PLT 234 281   BMET  Recent Labs  01/15/14 0620 01/16/14 0544 01/17/14 0448  NA 136* 137 136*  K 4.0 4.0 3.5*  CL 101 99 96  CO2 22 19 25   GLUCOSE 98 131* 127*  BUN 12 14 15   CREATININE 1.02 1.09 1.07  CALCIUM 9.1 9.0 8.8   LFTs  Recent Labs  01/15/14 0620 01/16/14 0544 01/17/14 0448  BILITOT 3.9* 2.4* 1.9*  ALKPHOS 766* 800* 641*  AST 97* 64* 30  ALT 88* 75* 51*  PROT 6.3 6.4 6.2  ALBUMIN 2.1* 2.1* 2.1*   No results found for this basename: LIPASE,  in the last 72 hours PT/INR No results found for this basename: LABPROT, INR,  in the last 72 hours    Imaging Studies: Dg Chest 2 View  01/12/2014    CLINICAL DATA:  Fever.  Confusion.  Initial evaluation.  EXAM: CHEST  2 VIEW  COMPARISON:  Chest CT 8/17 2015.  FINDINGS: Power port catheter tip noted projected over upper superior vena cava. Mediastinum unremarkable. Right hilar fullness noted consistent with adenopathy. Right perihilar infiltrate noted. Subsegmental atelectasis lung bases. Prior CABG. Cardiomegaly with normal pulmonary vascularity. No pleural effusion or pneumothorax. No acute osseus abnormality.  IMPRESSION: 1. Power port catheter with tip projected over superior vena cava. 2. Right hilar fullness suggesting adenopathy. Right perihilar infiltrate noted. 3. Prior CABG.  Cardiomegaly, no CHF.   Electronically Signed   By: Marcello Moores  Register   On: 01/12/2014 02:35   Mr Abdomen Mrcp Wo Cm  01/13/2014   CLINICAL DATA:  Upper abdominal pain and nausea for 1 week. Patient has a history of peritoneal carcinomatosis.  EXAM: MRI ABDOMEN WITHOUT CONTRAST  (INCLUDING MRCP)  TECHNIQUE: Multiplanar multisequence MR imaging of the abdomen was performed. Heavily T2-weighted images of the biliary and pancreatic ducts were obtained, and three-dimensional MRCP images were rendered by post processing.  COMPARISON:  CT scan 11/19/2013.  FINDINGS: Examination is quite limited due to patient motion/breathing artifact. Lack of IV contrast is also limiting.  There is new intrahepatic biliary dilatation. The common bile duct measures a maximum of 10 mm in the porta hepatis but is normal in the head of the pancreas. There is a fairly abrupt transition zone.  Please see series 4 image number 21. No obvious pancreatic head mass or acute pancreatitis. No definite common bile duct stones. There is a small duodenal diverticulum near the pancreatic head but no obvious compressive mass. No ampullary lesion is identified. This could be due to peritoneal seeding and extrinsic compression.  No focal liver lesions. The portal vein demonstrates patent flow voids. The gallbladder is  distended. No definite gallstones.  The spleen is upper limits of normal in size. No focal lesions. The adrenal glands and left kidney are normal. The right kidney demonstrates new hydronephrosis. There is also upper right hydroureter. This was not present on the CT scan from 11/19/2013. I do see a small lower pole right renal calculus on the CT scan from 08/29/2013 and it is possible this is due to a obstructing ureteral calculus.  There are scattered upper abdominal lymph nodes No obvious mass or bulky adenopathy.  No significant bony findings.  IMPRESSION: New intrahepatic and common bile duct dilatation with an abrupt transition to a normal or small caliber common bile duct in the head of the pancreas. No pancreatic head mass, common bile duct stones or obvious changes of acute pancreatitis to account for this. Common bile duct tumor or extrinsic compression by peritoneal carcinomatosis is possible. ERCP may be helpful for diagnostic and therapeutic purposes.  New right-sided hydroureteronephrosis possibly due to obstructing ureteral calculus. A followup noncontrast CT scan of the abdomen/ pelvis may be helpful for further evaluation.   Electronically Signed   By: Kalman Jewels M.D.   On: 01/13/2014 17:36   Mr 3d Recon At Scanner  01/13/2014   CLINICAL DATA:  Upper abdominal pain and nausea for 1 week. Patient has a history of peritoneal carcinomatosis.  EXAM: MRI ABDOMEN WITHOUT CONTRAST  (INCLUDING MRCP)  TECHNIQUE: Multiplanar multisequence MR imaging of the abdomen was performed. Heavily T2-weighted images of the biliary and pancreatic ducts were obtained, and three-dimensional MRCP images were rendered by post processing.  COMPARISON:  CT scan 11/19/2013.  FINDINGS: Examination is quite limited due to patient motion/breathing artifact. Lack of IV contrast is also limiting.  There is new intrahepatic biliary dilatation. The common bile duct measures a maximum of 10 mm in the porta hepatis but is normal  in the head of the pancreas. There is a fairly abrupt transition zone. Please see series 4 image number 21. No obvious pancreatic head mass or acute pancreatitis. No definite common bile duct stones. There is a small duodenal diverticulum near the pancreatic head but no obvious compressive mass. No ampullary lesion is identified. This could be due to peritoneal seeding and extrinsic compression.  No focal liver lesions. The portal vein demonstrates patent flow voids. The gallbladder is distended. No definite gallstones.  The spleen is upper limits of normal in size. No focal lesions. The adrenal glands and left kidney are normal. The right kidney demonstrates new hydronephrosis. There is also upper right hydroureter. This was not present on the CT scan from 11/19/2013. I do see a small lower pole right renal calculus on the CT scan from 08/29/2013 and it is possible this is due to a obstructing ureteral calculus.  There are scattered upper abdominal lymph nodes No obvious mass or bulky adenopathy.  No significant bony findings.  IMPRESSION: New intrahepatic and common bile duct dilatation with an abrupt transition to a normal or small caliber common bile duct in the head of the pancreas. No pancreatic head mass, common bile duct stones or obvious changes of  acute pancreatitis to account for this. Common bile duct tumor or extrinsic compression by peritoneal carcinomatosis is possible. ERCP may be helpful for diagnostic and therapeutic purposes.  New right-sided hydroureteronephrosis possibly due to obstructing ureteral calculus. A followup noncontrast CT scan of the abdomen/ pelvis may be helpful for further evaluation.   Electronically Signed   By: Kalman Jewels M.D.   On: 01/13/2014 17:36     Dg Ercp With Sphincterotomy  01/15/2014   CLINICAL DATA:  History of upper abdominal pain and nausea. History of peritoneal carcinomatosis.  EXAM: ERCP  TECHNIQUE: Multiple spot images obtained with the fluoroscopic  device and submitted for interpretation post-procedure.  COMPARISON:  MRI 01/13/2014  FINDINGS: Contrast injections demonstrate dilatation of the intrahepatic biliary system and common hepatic duct. Contrast drains into the gallbladder. There is a long segment narrowing of the common bile duct. Wire was advanced into the intrahepatic biliary system. The common bile duct was dilated with a balloon. Evidence for brush biopsy. A metallic stent was placed in the common hepatic duct and common bile duct.  IMPRESSION: Narrowing of the common bile duct with dilatation of the common hepatic duct and intrahepatic ducts.  Placement of metallic biliary stent.  These images were submitted for radiologic interpretation only. Please see the procedural report for the amount of contrast and the fluoroscopy time utilized.   Electronically Signed   By: Markus Daft M.D.   On: 01/15/2014 14:18   US Abdomen Limited Ruq  01/11/2014   CLINICAL DATA:  Postprandial right upper quadrant abdominal pain over the past 2 weeks. Elevated liver function tests. Current history of ovarian cancer. Prior history of breast cancer.  EXAM: US ABDOMEN LIMITED - RIGHT UPPER QUADRANT  COMPARISON:  CTA abdomen 11/19/2013, 06/12/2013.  FINDINGS: Gallbladder:  Markedly dilated, measuring approximately 13.9 x 4.1 x 4.3 cm. Large amount of layering echogenic sludge. No shadowing gallstones. No gallbladder wall thickening or pericholecystic fluid. Negative sonographic Murphy sign according to the ultrasound technologist.  Common bile duct:  Diameter: Approximately 14 mm.  Echogenic sludge within the common bile duct. No visible gallstones.  Liver:  Normal size and echotexture without focal parenchymal abnormality. Patent portal vein with hepatopetal flow. Minimal ascites adjacent to the liver.  IMPRESSION: 1. Dilated gallbladder containing a large amount of echogenic sludge. No evidence of cholelithiasis or acute cholecystitis. 2. Dilated common bile duct up to  14 mm, containing echogenic sludge. No visible common bile duct stones. 3. Minimal perihepatic ascites.   Electronically Signed   By: Evangeline Dakin M.D.   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 Respiratory failure after anesthesia requiring intubation; however, she was just extubated this morning and tolerated well. Transaminases and bilirubin continue to improve. Persistent abdominal pain, which is likely multifactorial secondary to advanced disease but much better. Tolerating diet.    Question of right pulmonary mass: CT chest in future per admitting physicians.   Plan: 1. Continue PPI. 2. Follow LFTs to baseline. Check early next week if discharged today. Discussed with Dr. Sarajane Jews.  3. Discussed with Dr. Gala Romney, consider 5 more days of antibiotics for coverage for probably cholangitis. Augmentin or Levaquin.    LOS: 6 days   Neil Crouch  01/17/2014,  8:22 AM

## 2014-01-17 NOTE — Progress Notes (Signed)
REVIEWED. AGREE. NO ADDITIONAL RECOMMENDATIONS. 

## 2014-01-17 NOTE — Addendum Note (Signed)
Addendum created 01/17/14 0830 by Mickel Baas, CRNA   Modules edited: Notes Section   Notes Section:  File: [id number]

## 2014-01-17 NOTE — Progress Notes (Signed)
PROGRESS NOTE  Erika Moreno JSE:[id number] DOB: 01-01-1946 DOA: 01/11/2014 PCP: [name], MD Oncology Dr. Barnet Glasgow  Summary: 6PXT with h/o stage IV cancer of unknown primary, direct admit to [facility] from [facility] for lethargy, n/v/, fever and hyperbilirubinemia. US gallbladder with sludge and dilated hepatobiliary tree. MRCP with intrahepatic and CBD dilatation with abrupt transition to a normal/small caliber CBD in head of pancreas. CBD tumor or extrinsic compression by peritoneal carcinomatosis as differentials. S/p ERCP 10/13   Assessment/Plan: 1. VDRF s/p ERCP. Etiology unclear, no s/s of infection. No evidence of pneumonia, infiltrate on CXR present on admission. Possibly secondary to volume overload. No evidence of CHF. 2. Acute pulmonary edema. Likely secondary to fluids in OR. Resolved.  3. Suspected cholangitis. Fever resolved <24 hours after admission. LFTs continue to improved. 5 more days abx per GI. 4. CBD dilitation, stricture with jaundice, s/p ERCP with stent placement 10/13. LFTs continue to improve. 5. Possible pneumonia, possible lung mass. Seen on admit CXR 10/9. Most recent CXR with improvement, suggest f/u chest CT in future.  6. Acute encephalopathy on admission secondary to Fentanyl patch. Resolved. Dose adjusted by oncology. 7. N/v, dehydration. Resolved.  8. Normocytic anemia likely secondary to chemotherapy, chronic disease. Remains stable.  9. History of stage IV ovarian carcinoma, with peritoneal carcinomatosis; last chemotherapy 3 weeks ago. 10. Chronic pain, anxiety, depression. Stable. Oncology decreased fentanyl patch to 12/5; increased roxicodone.    Much improved, plan discharge home today, clear per GI. Finish abx for suspected cholangitis and possible pneumonia.   Change to oral abx  Resume Plavix after 7 days post-procedure per discussion with Dr. Sydell Axon today.  Discussed with T. Kefalas, will arrange f/u with in 1 week with Cancer center,  rec hold everolimus.  Discussed with family at bedside  Murray Hodgkins, MD  [facility]  Pager [phone] If 7PM-7AM, please contact night-coverage at [email or web address], password [facility] 01/17/2014, 9:16 AM  LOS: 6 days   Consultants:  General surgery  GI  Oncology   Pulmonology  Procedures:  10/13 ERCP IMPRESSION: Long critical biliary stricture with marked upstream dilation of the biliary tree status post biliary sphincterotomy and balloon stricture dilation, followed by brushing for cytology and expandable metallic stent placement as described above.  ETT 10/13 >> 10/14  Antibiotics:  Zosyn 10/10 >> 10/15  Augmentin 10/15 >> 10/19  HPI/Subjective: No issues overnight, feels much better, no abd pain. Eating well. Breathing fine. Some cough. Wants to go home.   Objective: Filed Vitals:   01/17/14 0500 01/17/14 0600 01/17/14 0700 01/17/14 0759  BP: 133/93 132/85 153/97   Pulse: 94 83 91   Temp:    97.2 F (36.2 C)  TempSrc:    Oral  Resp: 30 19 20    Height:      Weight:      SpO2: 96% 98% 96%     Intake/Output Summary (Last 24 hours) at 01/17/14 0916 Last data filed at 01/17/14 0600  Gross per 24 hour  Intake 3350.5 ml  Output   1300 ml  Net 2050.5 ml     Filed Weights   01/11/14 1352 01/15/14 0528  Weight: 65.3 kg (143 lb 15.4 oz) 64.3 kg (141 lb 12.1 oz)    Exam:     Afebrile, VSS, no hypoxia  Alert, speech fluent and clear, appears calm and comfortable  CV RRR no m/r/g. No LE edema. Telemetry SR  Resp CTA bilaterally no w/r/r. Normal resp effort  Abd soft  Data Reviewed:  UOP 1300  with mutiple voids; fluid balance should be far less than 7 liters. No evidence of volume overload.  Potassium 3.5, AP down to 641, AST normal, ALT down to 51, t bili down to 1.9  Hgb stable at 9.3  CXR 10/14 improved aeration right lung  Scheduled Meds: . antiseptic oral rinse  7 mL Mouth Rinse QID  . aspirin  81 mg Oral Daily  . chlorhexidine  15  mL Mouth Rinse BID  . Chlorhexidine Gluconate Cloth  6 each Topical Q0600  . DULoxetine  30 mg Oral Daily  . feeding supplement (ENSURE COMPLETE)  237 mL Oral BID BM  . isosorbide mononitrate  60 mg Oral Daily  . metoCLOPramide  5 mg Oral TID AC & HS  . mupirocin ointment  1 application Nasal BID  . pantoprazole (PROTONIX) IV  40 mg Intravenous Daily  . piperacillin-tazobactam (ZOSYN)  IV  3.375 g Intravenous Q8H  . predniSONE  5 mg Oral Q breakfast   Continuous Infusions: . sodium chloride 10 mL/hr at 01/17/14 0700    Principal Problem:   Dilated cbd, acquired Active Problems:   Cancer of unknown origin   Cholangitis   Dehydration   Nausea and vomiting   Chronic pain syndrome   Benign essential HTN   Protein-calorie malnutrition, severe   Acute respiratory failure with hypoxia   Acute encephalopathy

## 2014-01-17 NOTE — Telephone Encounter (Signed)
Lab order done and mailed to pt. 

## 2014-01-17 NOTE — Anesthesia Postprocedure Evaluation (Signed)
  Anesthesia Post-op Note  Patient: Erika Moreno  Procedure(s) Performed: Procedure(s): ENDOSCOPIC RETROGRADE CHOLANGIOPANCREATOGRAPHY (ERCP) SPHINCTEROTOMY BILIARY WALL STENT PLACEMENT BALLOON DILATION  Patient Location: ICU  Anesthesia Type:General  Level of Consciousness: awake, alert , oriented and patient cooperative  Airway and Oxygen Therapy: Patient Spontanous Breathing  Post-op Pain: mild  Post-op Assessment: Post-op Vital signs reviewed, Patient's Cardiovascular Status Stable, Respiratory Function Stable and Patent Airway  Post-op Vital Signs: Reviewed and stable  Last Vitals:  Filed Vitals:   01/17/14 0759  BP:   Pulse:   Temp: 36.2 C  Resp:     Complications: Doing well after extubation.

## 2014-01-17 NOTE — Progress Notes (Signed)
She is doing well after extubation with no new problems. I will plan to sign off. Thanks for allowing me to see her with you

## 2014-01-17 NOTE — Telephone Encounter (Signed)
Patient needs repeat LFTs in one week. Dx:CBD stent, abnormal LFTs.

## 2014-01-20 ENCOUNTER — Encounter: Payer: Self-pay | Admitting: Internal Medicine

## 2014-01-22 ENCOUNTER — Inpatient Hospital Stay (HOSPITAL_COMMUNITY)
Admission: EM | Admit: 2014-01-22 | Discharge: 2014-01-27 | DRG: 374 | Disposition: A | Discharge status: Home Health | Payer: PRIVATE HEALTH INSURANCE | Attending: Internal Medicine | Admitting: Internal Medicine

## 2014-01-22 ENCOUNTER — Emergency Department (HOSPITAL_COMMUNITY): Payer: PRIVATE HEALTH INSURANCE

## 2014-01-22 ENCOUNTER — Encounter (HOSPITAL_COMMUNITY): Payer: Self-pay | Admitting: Emergency Medicine

## 2014-01-22 DIAGNOSIS — G894 Chronic pain syndrome: Secondary | ICD-10-CM

## 2014-01-22 DIAGNOSIS — E039 Hypothyroidism, unspecified: Secondary | ICD-10-CM | POA: Diagnosis present

## 2014-01-22 DIAGNOSIS — R600 Localized edema: Secondary | ICD-10-CM

## 2014-01-22 DIAGNOSIS — I252 Old myocardial infarction: Secondary | ICD-10-CM | POA: Diagnosis not present

## 2014-01-22 DIAGNOSIS — Z8249 Family history of ischemic heart disease and other diseases of the circulatory system: Secondary | ICD-10-CM | POA: Diagnosis not present

## 2014-01-22 DIAGNOSIS — I10 Essential (primary) hypertension: Secondary | ICD-10-CM | POA: Diagnosis present

## 2014-01-22 DIAGNOSIS — Z7401 Bed confinement status: Secondary | ICD-10-CM

## 2014-01-22 DIAGNOSIS — C801 Malignant (primary) neoplasm, unspecified: Secondary | ICD-10-CM | POA: Diagnosis present

## 2014-01-22 DIAGNOSIS — Z888 Allergy status to other drugs, medicaments and biological substances status: Secondary | ICD-10-CM

## 2014-01-22 DIAGNOSIS — N131 Hydronephrosis with ureteral stricture, not elsewhere classified: Secondary | ICD-10-CM

## 2014-01-22 DIAGNOSIS — Z79891 Long term (current) use of opiate analgesic: Secondary | ICD-10-CM | POA: Diagnosis not present

## 2014-01-22 DIAGNOSIS — K219 Gastro-esophageal reflux disease without esophagitis: Secondary | ICD-10-CM | POA: Diagnosis present

## 2014-01-22 DIAGNOSIS — C569 Malignant neoplasm of unspecified ovary: Secondary | ICD-10-CM | POA: Diagnosis present

## 2014-01-22 DIAGNOSIS — Z885 Allergy status to narcotic agent status: Secondary | ICD-10-CM | POA: Diagnosis not present

## 2014-01-22 DIAGNOSIS — Z79899 Other long term (current) drug therapy: Secondary | ICD-10-CM | POA: Diagnosis not present

## 2014-01-22 DIAGNOSIS — Z87891 Personal history of nicotine dependence: Secondary | ICD-10-CM

## 2014-01-22 DIAGNOSIS — T451X5A Adverse effect of antineoplastic and immunosuppressive drugs, initial encounter: Secondary | ICD-10-CM | POA: Diagnosis present

## 2014-01-22 DIAGNOSIS — Z8541 Personal history of malignant neoplasm of cervix uteri: Secondary | ICD-10-CM

## 2014-01-22 DIAGNOSIS — E559 Vitamin D deficiency, unspecified: Secondary | ICD-10-CM | POA: Diagnosis present

## 2014-01-22 DIAGNOSIS — Z792 Long term (current) use of antibiotics: Secondary | ICD-10-CM

## 2014-01-22 DIAGNOSIS — C786 Secondary malignant neoplasm of retroperitoneum and peritoneum: Secondary | ICD-10-CM | POA: Diagnosis present

## 2014-01-22 DIAGNOSIS — D6481 Anemia due to antineoplastic chemotherapy: Secondary | ICD-10-CM | POA: Diagnosis present

## 2014-01-22 DIAGNOSIS — Z800 Family history of malignant neoplasm of digestive organs: Secondary | ICD-10-CM | POA: Diagnosis not present

## 2014-01-22 DIAGNOSIS — G620 Drug-induced polyneuropathy: Secondary | ICD-10-CM

## 2014-01-22 DIAGNOSIS — N138 Other obstructive and reflux uropathy: Secondary | ICD-10-CM | POA: Diagnosis present

## 2014-01-22 DIAGNOSIS — Z951 Presence of aortocoronary bypass graft: Secondary | ICD-10-CM

## 2014-01-22 DIAGNOSIS — D638 Anemia in other chronic diseases classified elsewhere: Secondary | ICD-10-CM | POA: Diagnosis present

## 2014-01-22 DIAGNOSIS — R609 Edema, unspecified: Secondary | ICD-10-CM

## 2014-01-22 DIAGNOSIS — K8309 Other cholangitis: Secondary | ICD-10-CM

## 2014-01-22 DIAGNOSIS — I2510 Atherosclerotic heart disease of native coronary artery without angina pectoris: Secondary | ICD-10-CM | POA: Diagnosis present

## 2014-01-22 DIAGNOSIS — K830 Cholangitis: Secondary | ICD-10-CM | POA: Diagnosis present

## 2014-01-22 DIAGNOSIS — Z881 Allergy status to other antibiotic agents status: Secondary | ICD-10-CM | POA: Diagnosis not present

## 2014-01-22 DIAGNOSIS — K859 Acute pancreatitis, unspecified: Secondary | ICD-10-CM | POA: Diagnosis present

## 2014-01-22 DIAGNOSIS — K838 Other specified diseases of biliary tract: Secondary | ICD-10-CM | POA: Diagnosis present

## 2014-01-22 DIAGNOSIS — E43 Unspecified severe protein-calorie malnutrition: Secondary | ICD-10-CM

## 2014-01-22 DIAGNOSIS — R1013 Epigastric pain: Secondary | ICD-10-CM

## 2014-01-22 DIAGNOSIS — F419 Anxiety disorder, unspecified: Secondary | ICD-10-CM | POA: Diagnosis present

## 2014-01-22 DIAGNOSIS — Z9221 Personal history of antineoplastic chemotherapy: Secondary | ICD-10-CM

## 2014-01-22 DIAGNOSIS — Z7952 Long term (current) use of systemic steroids: Secondary | ICD-10-CM

## 2014-01-22 DIAGNOSIS — J189 Pneumonia, unspecified organism: Secondary | ICD-10-CM | POA: Diagnosis present

## 2014-01-22 DIAGNOSIS — E860 Dehydration: Secondary | ICD-10-CM

## 2014-01-22 DIAGNOSIS — G9340 Encephalopathy, unspecified: Secondary | ICD-10-CM

## 2014-01-22 DIAGNOSIS — N1330 Unspecified hydronephrosis: Secondary | ICD-10-CM | POA: Diagnosis present

## 2014-01-22 DIAGNOSIS — Z7982 Long term (current) use of aspirin: Secondary | ICD-10-CM

## 2014-01-22 DIAGNOSIS — D649 Anemia, unspecified: Secondary | ICD-10-CM

## 2014-01-22 DIAGNOSIS — R112 Nausea with vomiting, unspecified: Secondary | ICD-10-CM

## 2014-01-22 DIAGNOSIS — Z7902 Long term (current) use of antithrombotics/antiplatelets: Secondary | ICD-10-CM | POA: Diagnosis not present

## 2014-01-22 DIAGNOSIS — R0609 Other forms of dyspnea: Secondary | ICD-10-CM

## 2014-01-22 DIAGNOSIS — E538 Deficiency of other specified B group vitamins: Secondary | ICD-10-CM | POA: Diagnosis present

## 2014-01-22 DIAGNOSIS — R109 Unspecified abdominal pain: Secondary | ICD-10-CM | POA: Diagnosis present

## 2014-01-22 DIAGNOSIS — J9601 Acute respiratory failure with hypoxia: Secondary | ICD-10-CM

## 2014-01-22 LAB — CBC WITH DIFFERENTIAL/PLATELET
BASOS PCT: 0 % (ref 0–1)
Basophils Absolute: 0 10*3/uL (ref 0.0–0.1)
Eosinophils Absolute: 0.1 10*3/uL (ref 0.0–0.7)
Eosinophils Relative: 1 % (ref 0–5)
HCT: 27.7 % — ABNORMAL LOW (ref 36.0–46.0)
Hemoglobin: 8.6 g/dL — ABNORMAL LOW (ref 12.0–15.0)
LYMPHS ABS: 0.7 10*3/uL (ref 0.7–4.0)
Lymphocytes Relative: 14 % (ref 12–46)
MCH: 29.2 pg (ref 26.0–34.0)
MCHC: 31 g/dL (ref 30.0–36.0)
MCV: 93.9 fL (ref 78.0–100.0)
MONOS PCT: 11 % (ref 3–12)
Monocytes Absolute: 0.6 10*3/uL (ref 0.1–1.0)
NEUTROS ABS: 3.8 10*3/uL (ref 1.7–7.7)
NEUTROS PCT: 74 % (ref 43–77)
Platelets: 242 10*3/uL (ref 150–400)
RBC: 2.95 MIL/uL — AB (ref 3.87–5.11)
RDW: 18.7 % — ABNORMAL HIGH (ref 11.5–15.5)
WBC: 5.2 10*3/uL (ref 4.0–10.5)

## 2014-01-22 LAB — URINALYSIS, ROUTINE W REFLEX MICROSCOPIC
Bilirubin Urine: NEGATIVE
Glucose, UA: NEGATIVE mg/dL
HGB URINE DIPSTICK: NEGATIVE
Ketones, ur: NEGATIVE mg/dL
Leukocytes, UA: NEGATIVE
Nitrite: NEGATIVE
PROTEIN: NEGATIVE mg/dL
Specific Gravity, Urine: 1.015 (ref 1.005–1.030)
UROBILINOGEN UA: 1 mg/dL (ref 0.0–1.0)
pH: 8 (ref 5.0–8.0)

## 2014-01-22 LAB — COMPREHENSIVE METABOLIC PANEL
ALT: 24 U/L (ref 0–35)
AST: 31 U/L (ref 0–37)
Albumin: 2.4 g/dL — ABNORMAL LOW (ref 3.5–5.2)
Alkaline Phosphatase: 323 U/L — ABNORMAL HIGH (ref 39–117)
Anion gap: 11 (ref 5–15)
BUN: 15 mg/dL (ref 6–23)
CALCIUM: 9.3 mg/dL (ref 8.4–10.5)
CO2: 35 mEq/L — ABNORMAL HIGH (ref 19–32)
Chloride: 95 mEq/L — ABNORMAL LOW (ref 96–112)
Creatinine, Ser: 0.92 mg/dL (ref 0.50–1.10)
GFR calc non Af Amer: 63 mL/min — ABNORMAL LOW (ref >90)
GFR, EST AFRICAN AMERICAN: 72 mL/min — AB (ref >90)
GLUCOSE: 103 mg/dL — AB (ref 70–99)
Potassium: 3.5 mEq/L — ABNORMAL LOW (ref 3.7–5.3)
SODIUM: 141 meq/L (ref 137–147)
TOTAL PROTEIN: 6.7 g/dL (ref 6.0–8.3)
Total Bilirubin: 1 mg/dL (ref 0.3–1.2)

## 2014-01-22 LAB — LIPASE, BLOOD: Lipase: 33 U/L (ref 11–59)

## 2014-01-22 LAB — I-STAT TROPONIN, ED: Troponin i, poc: 0.08 ng/mL (ref 0.00–0.08)

## 2014-01-22 LAB — I-STAT CG4 LACTIC ACID, ED: LACTIC ACID, VENOUS: 0.57 mmol/L (ref 0.5–2.2)

## 2014-01-22 MED ORDER — ONDANSETRON HCL 4 MG PO TABS
4.0000 mg | ORAL_TABLET | Freq: Four times a day (QID) | ORAL | Status: DC | PRN
  Administered 2014-01-25: 4 mg via ORAL
  Filled 2014-01-22: qty 1

## 2014-01-22 MED ORDER — ONDANSETRON HCL 4 MG/2ML IJ SOLN
4.0000 mg | Freq: Once | INTRAMUSCULAR | Status: AC
  Administered 2014-01-22: 4 mg via INTRAVENOUS
  Filled 2014-01-22: qty 2

## 2014-01-22 MED ORDER — ISOSORBIDE MONONITRATE ER 60 MG PO TB24
60.0000 mg | ORAL_TABLET | Freq: Every day | ORAL | Status: DC
Start: 2014-01-22 — End: 2014-01-27
  Administered 2014-01-22: 60 mg via ORAL
  Administered 2014-01-23: 30 mg via ORAL
  Administered 2014-01-24 – 2014-01-27 (×4): 60 mg via ORAL
  Filled 2014-01-22 (×6): qty 1

## 2014-01-22 MED ORDER — HYDROMORPHONE HCL 1 MG/ML IJ SOLN
1.0000 mg | INTRAMUSCULAR | Status: DC | PRN
  Administered 2014-01-22 – 2014-01-26 (×21): 1 mg via INTRAVENOUS
  Filled 2014-01-22 (×22): qty 1

## 2014-01-22 MED ORDER — HYDROXYZINE HCL 10 MG PO TABS
10.0000 mg | ORAL_TABLET | Freq: Three times a day (TID) | ORAL | Status: DC | PRN
  Administered 2014-01-24 – 2014-01-25 (×4): 10 mg via ORAL
  Filled 2014-01-22 (×4): qty 1

## 2014-01-22 MED ORDER — HYDROMORPHONE HCL 1 MG/ML IJ SOLN
1.0000 mg | Freq: Once | INTRAMUSCULAR | Status: AC
  Administered 2014-01-22: 1 mg via INTRAVENOUS
  Filled 2014-01-22: qty 1

## 2014-01-22 MED ORDER — ONDANSETRON HCL 4 MG/2ML IJ SOLN
4.0000 mg | Freq: Four times a day (QID) | INTRAMUSCULAR | Status: DC | PRN
  Administered 2014-01-22 – 2014-01-25 (×3): 4 mg via INTRAVENOUS
  Filled 2014-01-22 (×3): qty 2

## 2014-01-22 MED ORDER — ASPIRIN EC 81 MG PO TBEC
81.0000 mg | DELAYED_RELEASE_TABLET | Freq: Every day | ORAL | Status: DC
  Administered 2014-01-22 – 2014-01-27 (×6): 81 mg via ORAL
  Filled 2014-01-22 (×6): qty 1

## 2014-01-22 MED ORDER — IOHEXOL 300 MG/ML  SOLN
100.0000 mL | Freq: Once | INTRAMUSCULAR | Status: AC | PRN
  Administered 2014-01-22: 100 mL via INTRAVENOUS

## 2014-01-22 MED ORDER — AZITHROMYCIN 500 MG IV SOLR
500.0000 mg | INTRAVENOUS | Status: DC
  Administered 2014-01-22 – 2014-01-23 (×2): 500 mg via INTRAVENOUS
  Filled 2014-01-22 (×3): qty 500

## 2014-01-22 MED ORDER — PREDNISONE 5 MG PO TABS
5.0000 mg | ORAL_TABLET | Freq: Every day | ORAL | Status: DC
  Administered 2014-01-23 – 2014-01-27 (×5): 5 mg via ORAL
  Filled 2014-01-22 (×6): qty 1

## 2014-01-22 MED ORDER — PANTOPRAZOLE SODIUM 40 MG PO TBEC
40.0000 mg | DELAYED_RELEASE_TABLET | Freq: Two times a day (BID) | ORAL | Status: DC
  Administered 2014-01-22 – 2014-01-27 (×10): 40 mg via ORAL
  Filled 2014-01-22 (×11): qty 1

## 2014-01-22 MED ORDER — CYCLOBENZAPRINE HCL 10 MG PO TABS
10.0000 mg | ORAL_TABLET | Freq: Three times a day (TID) | ORAL | Status: DC | PRN
  Filled 2014-01-22: qty 1

## 2014-01-22 MED ORDER — METOCLOPRAMIDE HCL 5 MG/ML IJ SOLN
10.0000 mg | Freq: Four times a day (QID) | INTRAMUSCULAR | Status: DC
  Administered 2014-01-22 – 2014-01-23 (×4): 10 mg via INTRAVENOUS
  Filled 2014-01-22 (×4): qty 2

## 2014-01-22 MED ORDER — FLUCONAZOLE 100 MG PO TABS
150.0000 mg | ORAL_TABLET | Freq: Once | ORAL | Status: AC
  Administered 2014-01-22: 150 mg via ORAL
  Filled 2014-01-22: qty 2

## 2014-01-22 MED ORDER — IOHEXOL 300 MG/ML  SOLN
25.0000 mL | Freq: Once | INTRAMUSCULAR | Status: AC | PRN
  Administered 2014-01-22: 25 mL via ORAL

## 2014-01-22 MED ORDER — ENOXAPARIN SODIUM 40 MG/0.4ML ~~LOC~~ SOLN
40.0000 mg | SUBCUTANEOUS | Status: DC
  Administered 2014-01-22 – 2014-01-26 (×4): 40 mg via SUBCUTANEOUS
  Filled 2014-01-22 (×6): qty 0.4

## 2014-01-22 MED ORDER — DULOXETINE HCL 30 MG PO CPEP
30.0000 mg | ORAL_CAPSULE | Freq: Every day | ORAL | Status: DC
  Administered 2014-01-22 – 2014-01-26 (×5): 30 mg via ORAL
  Filled 2014-01-22 (×6): qty 1

## 2014-01-22 MED ORDER — IOHEXOL 300 MG/ML  SOLN: 25.0000 mL | Freq: Once | INTRAMUSCULAR | Status: DC | PRN

## 2014-01-22 MED ORDER — DEXTROSE 5 % IV SOLN
1.0000 g | INTRAVENOUS | Status: DC
  Administered 2014-01-22 – 2014-01-26 (×5): 1 g via INTRAVENOUS
  Filled 2014-01-22 (×8): qty 10

## 2014-01-22 MED ORDER — SODIUM CHLORIDE 0.9 % IV SOLN
INTRAVENOUS | Status: DC
  Administered 2014-01-22 – 2014-01-23 (×2): via INTRAVENOUS

## 2014-01-22 NOTE — ED Provider Notes (Signed)
CSN: [id number]     Arrival date & time 01/22/14  1011 History  This chart was scribed for Sharyon Cable, MD by Ludger Nutting, ED Scribe. This patient was seen in room APA01/AP[facility]01 and the patient's care was started 10:26 AM.    Chief Complaint  Patient presents with  . Shortness of Breath    Patient is a 68 y.o. female presenting with shortness of breath and abdominal pain. The history is provided by the patient. No language interpreter was used.  Shortness of Breath Severity:  Mild Onset quality:  Gradual Duration:  1 week Timing:  Intermittent Progression:  Worsening Chronicity:  New Worsened by:  Exertion Ineffective treatments:  Rest Associated symptoms: abdominal pain, cough and vomiting (dry heaving)   Associated symptoms: no chest pain and no fever   Risk factors: hx of cancer   Abdominal Pain Pain location:  Epigastric Pain radiates to:  Does not radiate Pain severity:  Moderate Onset quality:  Gradual Duration:  1 week Timing:  Constant Progression:  Worsening Chronicity:  New Context: previous surgery   Relieved by:  Nothing Associated symptoms: cough, nausea, shortness of breath and vomiting (dry heaving)   Associated symptoms: no chest pain and no fever   Risk factors: multiple surgeries     HPI Comments: Erika Moreno is a 68 y.o. female with past medical history of CA, GERD, MI  who presents to the Emergency Department complaining of gradual onset, intermittent, gradually worsened SOB and constant epigastric abdominal pain that began 1 week ago. She states the SOB is worse with exertion and has an associated minor cough and lower extremity swelling. She also as associated nausea and dry heaving/gagging. She states it feels as though something is stuck in her abdomen. 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 She denies chest pain, fever.   Past Medical History  Diagnosis Date  . Cancer of unknown origin 11/14/2012    Favor breast  versus ovarian  . Cervical cancer 1984  . Myocardial infarction 20 yrs ago  . Coronary artery disease   . Anxiety   . Chronic back pain   . Depression   . GERD (gastroesophageal reflux disease)   . Hypertension   . Hypothyroidism   . Anemia   . Vitamin B 12 deficiency   . Vitamin D deficiency disease    Past Surgical History  Procedure Laterality Date  . Heart bypass      triple bypass with stents  . Coronary artery bypass graft      3 vessels  . Cardiac catheterization      stents x3  . Abdominal hysterectomy      partial  . Portacath placement Right 12/06/2012    Procedure: INSERTION PORT-A-CATH;  Surgeon: Donato Heinz, MD;  Location: [facility];  Service: General;  Laterality: Right;  . Ercp  01/15/2014    Procedure: ENDOSCOPIC RETROGRADE CHOLANGIOPANCREATOGRAPHY (ERCP);  Surgeon: Daneil Dolin, MD;  Location: [facility];  Service: Gastroenterology;;  . Sphincterotomy  01/15/2014    Procedure: Joan Mayans;  Surgeon: Daneil Dolin, MD;  Location: [facility];  Service: Gastroenterology;;  . Biliary stent placement  01/15/2014    Procedure: BILIARY WALL STENT PLACEMENT;  Surgeon: Daneil Dolin, MD;  Location: [facility];  Service: Gastroenterology;;  . Balloon dilation  01/15/2014    Procedure: [place]ie [place];  Surgeon: Daneil Dolin, MD;  Location: [facility];  Service: Gastroenterology;;   Family History  Problem Relation  Age of Onset  . Heart attack Mother   . Heart attack Father   . Colon cancer Paternal Grandmother    History  Substance Use Topics  . Smoking status: Former Smoker -- 2.00 packs/day for 22 years    Types: Cigarettes    Quit date: 11/21/1988  . Smokeless tobacco: Never Used  . Alcohol Use: No   OB History   Grav Para Term Preterm Abortions TAB SAB Ect Mult Living                 Review of Systems  Constitutional: Negative for fever.  Respiratory: Positive for cough and shortness of breath. Negative for chest tightness.   Cardiovascular: Positive for  leg swelling. Negative for chest pain.  Gastrointestinal: Positive for nausea, vomiting (dry heaving) and abdominal pain.  All other systems reviewed and are negative.     Allergies  Avastin; Morphine and related; Reglan; and Sulfa antibiotics  Home Medications   Prior to Admission medications   Medication Sig Start Date End Date Taking? Authorizing Provider  amoxicillin-clavulanate (AUGMENTIN) 875-125 MG per tablet Take 1 tablet by mouth every 12 (twelve) hours. 01/17/14   Samuella Cota, MD  aspirin 81 MG tablet Take 81 mg by mouth daily.    Historical Provider, MD  clopidogrel (PLAVIX) 75 MG tablet Take 1 tablet (75 mg total) by mouth daily. Restart October 20. 01/17/14   Samuella Cota, MD  cyclobenzaprine (FLEXERIL) 10 MG tablet Take 10 mg by mouth 3 (three) times daily as needed for muscle spasms.    Historical Provider, MD  diphenhydrAMINE (NIGHT TIME SLEEP AID) 25 MG tablet Take 25 mg by mouth at bedtime as needed for sleep.    Historical Provider, MD  DULoxetine (CYMBALTA) 30 MG capsule Take 1 capsule (30 mg total) by mouth daily. 07/30/13   Farrel Gobble, MD  furosemide (LASIX) 20 MG tablet Take 20 mg by mouth daily.     Historical Provider, MD  hydrOXYzine (ATARAX/VISTARIL) 10 MG tablet Take 10 mg by mouth every 8 (eight) hours as needed (pruitus).     Historical Provider, MD  isosorbide mononitrate (IMDUR) 60 MG 24 hr tablet Take 60 mg by mouth daily.     Historical Provider, MD  magnesium hydroxide (MILK OF MAGNESIA) 400 MG/5ML suspension Take 30 mLs by mouth daily as needed for mild constipation.    Historical Provider, MD  metoCLOPramide (REGLAN) 5 MG tablet Take 5 mg 3-4 times a day before meals and at bedtime to help with abdominal bloating. 01/02/14   Farrel Gobble, MD  Multiple Vitamins-Minerals (HAIR/SKIN/NAILS PO) Take 1 tablet by mouth daily.    Historical Provider, MD  nitroGLYCERIN (NITROSTAT) 0.6 MG SL tablet Place 0.4 mg under the tongue every 5 (five)  minutes as needed for chest pain.     Historical Provider, MD  ondansetron (ZOFRAN) 8 MG tablet Take 1 tablet by mouth every 8 hours as needed for nausea and vomiting 12/13/13   Baird Cancer, PA-C  Oxycodone HCl 10 MG TABS Take 1 or 2 tablets every 2-4 hours as needed for pain. 01/02/14   Farrel Gobble, MD  potassium chloride SA (K-DUR,KLOR-CON) 20 MEQ tablet Take 1 tablet (20 mEq total) by mouth 2 (two) times daily. 12/26/13   Farrel Gobble, MD  predniSONE (DELTASONE) 5 MG tablet Take 5 mg by mouth daily with breakfast.  07/09/13   Farrel Gobble, MD  prochlorperazine (COMPAZINE) 10 MG tablet Take 1 tablet (10 mg total) by mouth every  6 (six) hours as needed for nausea or vomiting. 12/19/13   Baird Cancer, PA-C  senna-docusate (SENOKOT-S) 8.6-50 MG per tablet Take 3 tablets by mouth at bedtime.    Historical Provider, MD   BP 155/91  Pulse 88  Temp(Src) 98.3 F (36.8 C) (Oral)  Resp 22  Ht 5\' 4"  (1.626 m)  Wt 135 lb (61.236 kg)  BMI 23.16 kg/m2  SpO2 97% Physical Exam  Nursing note and vitals reviewed. CONSTITUTIONAL: Well developed/well nourished HEAD: Normocephalic/atraumatic EYES: EOMI/PERRL, no icterus  ENMT: Mucous membranes moist NECK: supple no meningeal signs SPINE:entire spine nontender CV: S1/S2 noted, no murmurs/rubs/gallops noted LUNGS: Lungs are clear to auscultation bilaterally, no apparent distress ABDOMEN: soft, moderate epigastric tenderness, no rebound or guarding GU:no cva tenderness NEURO: Pt is awake/alert, moves all extremitiesx4 EXTREMITIES: pulses normal, full ROM, pitting edema bilaterally to lower extremities.  SKIN: warm, color normal PSYCH: no abnormalities of mood noted;  ED Course  Procedures   DIAGNOSTIC STUDIES: Oxygen Saturation is 97% on RA, adequate by my interpretation.    COORDINATION OF CARE: 10:32 AM Will order labs and imaging. Discussed treatment plan with pt at bedside and pt agreed to plan.  pt with recent ERCP with CBD  stent placement earlier this month.  Also known to have ovarian CA Will start with acute abd series given concern for intestinal obstruction 11:26 AM Pt reports improvement, labs/imaging pending 1:07 PM Pt still with significant epigastric tenderness Will proceed with CT abd/pelvis 3:32 PM Discussed Ct finding with dr Darrick Meigs Will admit for evaluation and pain control  Labs Review Labs Reviewed  COMPREHENSIVE METABOLIC PANEL - Abnormal; Notable for the following:    Potassium 3.5 (*)    Chloride 95 (*)    CO2 35 (*)    Glucose, Bld 103 (*)    Albumin 2.4 (*)    Alkaline Phosphatase 323 (*)    GFR calc non Af Amer 63 (*)    GFR calc Af Amer 72 (*)    All other components within normal limits  CBC WITH DIFFERENTIAL - Abnormal; Notable for the following:    RBC 2.95 (*)    Hemoglobin 8.6 (*)    HCT 27.7 (*)    RDW 18.7 (*)    All other components within normal limits  LIPASE, BLOOD  I-STAT CG4 LACTIC ACID, ED  I-STAT TROPOININ, ED    Imaging Review Dg Abd Acute W/chest  01/22/2014   CLINICAL DATA:  Acute chest and abdominal pain. Shortness of breath.  EXAM: ACUTE ABDOMEN SERIES (ABDOMEN 2 VIEW & CHEST 1 VIEW)  COMPARISON:  Chest radiograph January 16, 2014.  FINDINGS: Biliary stent is noted. No abnormal bowel dilatation is noted. Moderate stool burden is noted. No pneumoperitoneum is noted. Stable cardiomediastinal silhouette. Sternotomy wires are noted. Right subclavian Port-A-Cath is again noted with distal tip in expected position of right innominate vein. Minimal right basilar opacity is noted most consistent with subsegmental atelectasis. Minimal bilateral pleural effusions are noted.  IMPRESSION: Minimal bilateral pleural effusions. Minimal right basilar subsegmental atelectasis.  No evidence of bowel obstruction or ileus.   Electronically Signed   By: Sabino Dick M.D.   On: 01/22/2014 12:29     EKG Interpretation   Date/Time:  Tuesday January 22 2014 10:21:39  EDT Ventricular Rate:  102 PR Interval:  127 QRS Duration: 93 QT Interval:  366 QTC Calculation: 477 R Axis:   -41 Text Interpretation:  Sinus tachycardia Left axis deviation Anteroseptal  infarct, old Nonspecific  T abnormalities, lateral leads Baseline wander in  lead(s) V1 V4 V5 V6 artifact noted changes noted in anterior leads when  compared to prior Confirmed by [facility]  MD, [place] ([id number]) on 01/22/2014  10:36:34 AM       MDM   Final diagnoses:  Hydronephrosis, right  Epigastric abdominal pain  Anemia, unspecified anemia type    Nursing notes including past medical history and social history reviewed and considered in documentation xrays reviewed and considered Labs/vital reviewed and considered   I personally performed the services described in this documentation, which was scribed in my presence. The recorded information has been reviewed and is accurate.      Sharyon Cable, MD 01/22/14 [phone]

## 2014-01-22 NOTE — Consult Note (Signed)
Referring Provider: Dr. Darrick Meigs Primary Care Physician:  [name], MD Primary Gastroenterologist:  Dr. 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 10/9 admitted admitted secondary to worsening pain, fatigue, jaundice and elevated LFTs; ERCP 10/13 with biliary stricture and upstream dilation of biliary tree, s/p sphincterotomy and balloon stricture, dilation, brushing for cytology, and expandable metallic stent placed. Cytology brushing negative for malignancy. Respiratory failure after anesthesia requiring intubation; however, she improved within 24 hours and was extubated. Now presenting again just several days after recent discharge with abdominal pain and shortness of breath. CT abdomen with interval development of ill-defined soft tissue density in the porta hepatis region concerning for adenopathy or neoplasm; this is causing compression of portal vein, resulting in portal hypertension. Lipase normal.  Notes SOB worsening since discharge, requiring an upright position to breathe. Took 6 alka seltzers to try and settle stomach. Felt like her upper abdomen was "rolling". Noted epigastric pain. Associated nausea, some vomiting but mainly dry heaves. Decreased appetite but able to tolerate fluids. Complains of belching, bloated.   Past Medical History  Diagnosis Date  . 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 Cervical cancer 1984  . Myocardial infarction 20 yrs ago  . Coronary artery disease   . Anxiety   . Chronic back pain   . Depression   . GERD (gastroesophageal reflux disease)   . Hypertension   . Hypothyroidism   . Anemia   . Vitamin B 12 deficiency   . Vitamin D deficiency disease     Past Surgical History   Procedure Laterality Date  . Heart bypass      triple bypass with stents  . Coronary artery bypass graft      3 vessels  . Cardiac catheterization      stents x3  . Abdominal hysterectomy      partial  . Portacath placement Right 12/06/2012    Procedure: INSERTION PORT-A-CATH;  Surgeon: Donato Heinz, MD;  Location: [facility];  Service: General;  Laterality: Right;  . Ercp  01/15/2014    Dr. Gala Romney: biliary stricture and upstream dilation of biliary tree, s/p sphincterotomy and balloon stricture, dilation, brushing for cytology, and expandable metallic stent placed  . Sphincterotomy  01/15/2014    Procedure: SPHINCTEROTOMY;  Surgeon: Daneil Dolin, MD;  Location: [facility];  Service: Gastroenterology;;  . Biliary stent placement  01/15/2014    Procedure: BILIARY WALL STENT PLACEMENT;  Surgeon: Daneil Dolin, MD;  Location: [facility];  Service: Gastroenterology;;  . Balloon dilation  01/15/2014    Procedure: [place];  Surgeon: Daneil Dolin, MD;  Location: [facility];  Service: Gastroenterology;;    Prior to Admission medications   Medication Sig Start Date End Date Taking? Authorizing Provider  amoxicillin-clavulanate (AUGMENTIN) 875-125 MG per tablet Take 1 tablet by mouth every 12 (twelve) hours. 01/17/14  Yes Samuella Cota, MD  aspirin 81 MG tablet Take 81 mg by mouth daily.   Yes Historical Provider, MD  clopidogrel (PLAVIX) 75 MG tablet Take 1 tablet (75 mg total) by mouth daily. Restart October 20. 01/17/14  Yes Samuella Cota, MD  cyclobenzaprine (FLEXERIL) 10 MG tablet Take 10 mg by mouth 3 (three) times daily as needed for muscle  spasms.   Yes Historical Provider, MD  diphenhydrAMINE (NIGHT TIME SLEEP AID) 25 MG tablet Take 25 mg by mouth at bedtime as needed for sleep.   Yes Historical Provider, MD  DULoxetine (CYMBALTA) 30 MG capsule Take 1 capsule (30 mg total) by mouth daily. 07/30/13  Yes Farrel Gobble, MD  furosemide (LASIX) 20 MG tablet Take 20 mg by mouth daily.     Yes Historical Provider, MD  hydrOXYzine (ATARAX/VISTARIL) 10 MG tablet Take 10 mg by mouth every 8 (eight) hours as needed (pruitus).    Yes Historical Provider, MD  isosorbide mononitrate (IMDUR) 60 MG 24 hr tablet Take 60 mg by mouth daily.    Yes Historical Provider, MD  magnesium hydroxide (MILK OF MAGNESIA) 400 MG/5ML suspension Take 30 mLs by mouth daily as needed for mild constipation.   Yes Historical Provider, MD  metoCLOPramide (REGLAN) 5 MG tablet Take 5 mg 3-4 times a day before meals and at bedtime to help with abdominal bloating. 01/02/14  Yes Farrel Gobble, MD  Multiple Vitamins-Minerals (HAIR/SKIN/NAILS PO) Take 1 tablet by mouth daily.   Yes Historical Provider, MD  nitroGLYCERIN (NITROSTAT) 0.6 MG SL tablet Place 0.4 mg under the tongue every 5 (five) minutes as needed for chest pain.    Yes Historical Provider, MD  ondansetron (ZOFRAN) 8 MG tablet Take 1 tablet by mouth every 8 hours as needed for nausea and vomiting 12/13/13  Yes Baird Cancer, PA-C  Oxycodone HCl 10 MG TABS Take 1 or 2 tablets every 2-4 hours as needed for pain. 01/02/14  Yes Farrel Gobble, MD  potassium chloride SA (K-DUR,KLOR-CON) 20 MEQ tablet Take 1 tablet (20 mEq total) by mouth 2 (two) times daily. 12/26/13  Yes Farrel Gobble, MD  predniSONE (DELTASONE) 5 MG tablet Take 5 mg by mouth daily with breakfast.  07/09/13  Yes Farrel Gobble, MD  prochlorperazine (COMPAZINE) 10 MG tablet Take 1 tablet (10 mg total) by mouth every 6 (six) hours as needed for nausea or vomiting. 12/19/13  Yes [name] Kefalas, PA-C  senna-docusate (SENOKOT-S) 8.6-50 MG per tablet Take 3 tablets by mouth at bedtime.   Yes Historical Provider, MD    Current Facility-Administered Medications  Medication Dose Route Frequency Provider Last Rate Last Dose  . iohexol (OMNIPAQUE) 300 MG/ML solution 25 mL  25 mL Intravenous Once PRN Oswald Hillock, MD       Current Outpatient Prescriptions  Medication Sig Dispense Refill  .  amoxicillin-clavulanate (AUGMENTIN) 875-125 MG per tablet Take 1 tablet by mouth every 12 (twelve) hours.  9 tablet  0  . aspirin 81 MG tablet Take 81 mg by mouth daily.      . clopidogrel (PLAVIX) 75 MG tablet Take 1 tablet (75 mg total) by mouth daily. Restart October 20.      . cyclobenzaprine (FLEXERIL) 10 MG tablet Take 10 mg by mouth 3 (three) times daily as needed for muscle spasms.      . diphenhydrAMINE (NIGHT TIME SLEEP AID) 25 MG tablet Take 25 mg by mouth at bedtime as needed for sleep.      . DULoxetine (CYMBALTA) 30 MG capsule Take 1 capsule (30 mg total) by mouth daily.  30 capsule  6  . furosemide (LASIX) 20 MG tablet Take 20 mg by mouth daily.       . hydrOXYzine (ATARAX/VISTARIL) 10 MG tablet Take 10 mg by mouth every 8 (eight) hours as needed (pruitus).       . isosorbide mononitrate (IMDUR)  60 MG 24 hr tablet Take 60 mg by mouth daily.       . magnesium hydroxide (MILK OF MAGNESIA) 400 MG/5ML suspension Take 30 mLs by mouth daily as needed for mild constipation.      . metoCLOPramide (REGLAN) 5 MG tablet Take 5 mg 3-4 times a day before meals and at bedtime to help with abdominal bloating.  100 tablet  3  . Multiple Vitamins-Minerals (HAIR/SKIN/NAILS PO) Take 1 tablet by mouth daily.      . nitroGLYCERIN (NITROSTAT) 0.6 MG SL tablet Place 0.4 mg under the tongue every 5 (five) minutes as needed for chest pain.       [name] ondansetron (ZOFRAN) 8 MG tablet Take 1 tablet by mouth every 8 hours as needed for nausea and vomiting  45 tablet  0  . Oxycodone HCl 10 MG TABS Take 1 or 2 tablets every 2-4 hours as needed for pain.  120 tablet  0  . potassium chloride SA (K-DUR,KLOR-CON) 20 MEQ tablet Take 1 tablet (20 mEq total) by mouth 2 (two) times daily.  60 tablet  3  . predniSONE (DELTASONE) 5 MG tablet Take 5 mg by mouth daily with breakfast.       . prochlorperazine (COMPAZINE) 10 MG tablet Take 1 tablet (10 mg total) by mouth every 6 (six) hours as needed for nausea or vomiting.  60  tablet  2  . senna-docusate (SENOKOT-S) 8.6-50 MG per tablet Take 3 tablets by mouth at bedtime.        Allergies as of 01/22/2014 - Review Complete 01/22/2014  Allergen Reaction Noted  . Avastin [bevacizumab] Hives and Other (See Comments) 09/04/2013  . Morphine and related Other (See Comments) 10/03/2012  . Reglan [metoclopramide] Other (See Comments) 09/04/2013  . Sulfa antibiotics Itching 10/03/2012    Family History  Problem Relation Age of Onset  . Heart attack Mother   . Heart attack Father   . Colon cancer Paternal Grandmother     History   Social History  . Marital Status: Widowed    Spouse Name: N/A    Number of Children: N/A  . Years of Education: N/A   Occupational History  . Not on file.   Social History Main Topics  . Smoking status: Former Smoker -- 2.00 packs/day for 22 years    Types: Cigarettes    Quit date: 11/21/1988  . Smokeless tobacco: Never Used  . Alcohol Use: No  . Drug Use: No  . Sexual Activity: Not on file   Other Topics Concern  . Not on file   Social History Narrative  . No narrative on file    Review of Systems: Gen: see HPI. Feels fatigued.  CV: Denies chest pain, heart palpitations, syncope, edema  Resp: +SOB GI: see HPI GU : Denies urinary burning, urinary frequency, urinary incontinence.  MS: Denies joint pain,swelling, cramping Derm: Denies rash, itching, dry skin Psych: Denies depression, anxiety,confusion, or memory loss Heme: Denies bruising, bleeding, and enlarged lymph nodes.  Physical Exam: Vital signs in last 24 hours: Temp:  [98.3 F (36.8 C)] 98.3 F (36.8 C) (10/20 1019) Pulse Rate:  [71-105] 86 (10/20 1430) Resp:  [15-26] 20 (10/20 1530) BP: (132-155)/(82-94) 142/82 mmHg (10/20 1530) SpO2:  [92 %-100 %] 98 % (10/20 1430) Weight:  [135 lb (61.236 kg)] 135 lb (61.236 kg) (10/20 1019)   General:   Alert, pleasant and cooperative. Appears fatigued.  Head:  Normocephalic and atraumatic. Eyes:  Sclera  clear, no icterus.  Ears:  Normal auditory acuity. Nose:  No deformity, discharge,  or lesions. Mouth:  No deformity or lesions, edentulous Lungs:  Clear throughout to auscultation.   Heart:  S1 S2 present, no murmurs Abdomen:  Full, significantly TTP epigastric and RUQ.  Rectal:  Deferred  Msk:  Symmetrical without gross deformities. Normal posture. Extremities:  2-3+ lower extremity edema  Neurologic:  Alert and  oriented x4;  grossly normal neurologically. Skin:  Intact without significant lesions or rashes. Psych:  Alert and cooperative. Normal mood and affect.  Intake/Output from previous day:   Intake/Output this shift:    Lab Results:  Recent Labs  01/22/14 1051  WBC 5.2  HGB 8.6*  HCT 27.7*  PLT 242   BMET  Recent Labs  01/22/14 1051  NA 141  K 3.5*  CL 95*  CO2 35*  GLUCOSE 103*  BUN 15  CREATININE 0.92  CALCIUM 9.3   LFT  Recent Labs  01/22/14 1051  PROT 6.7  ALBUMIN 2.4*  AST 31  ALT 24  ALKPHOS 323*  BILITOT 1.0   Lab Results  Component Value Date   LIPASE 33 01/22/2014    Studies/Results: Ct Abdomen Pelvis W Contrast  01/22/2014   CLINICAL DATA:  Generalized abdominal pain.  EXAM: CT ABDOMEN AND PELVIS WITH CONTRAST  TECHNIQUE: Multidetector CT imaging of the abdomen and pelvis was performed using the standard protocol following bolus administration of intravenous contrast.  CONTRAST:  6mL OMNIPAQUE IOHEXOL 300 MG/ML SOLN, 173mL OMNIPAQUE IOHEXOL 300 MG/ML SOLN  COMPARISON:  CT scan of November 19, 2013.  FINDINGS: Severe degenerative disc disease is noted at L4-5 and L5-S1. Mild bilateral pleural effusions are now noted.  Large amount of contrast is noted within the gallbladder. There has been interval placement of biliary stent through common bile duct into duodenum. Pneumobilia is noted in the left hepatic lobe. Spleen appears normal. The pancreatic head appears to be enlarged compared to prior exam, concerning for neoplasm or  inflammation. Adrenal glands appear normal. Left kidney and ureter appear normal. Moderate right hydroureteronephrosis is noted without evidence of obstructing calculus. Distal right ureter appears to be normal in caliber, concerning for stricture. Atherosclerotic calcifications of abdominal aorta are noted without aneurysmal dilatation.  There is no evidence of bowel obstruction. There is the interval development of collateral veins in the in the anterior portion of the peritoneal space suggesting portal hypertension. This appears to be due to ill-defined soft tissue density in the porta hepatis measuring 3.1 x 2.9 cm consistent with adenopathy, which is compressing the main portal vein. Mildly enlarged lymph nodes are noted in the mesenteries with the largest measuring 13 x 11 mm in the right lower quadrant, which is increased in size compared to prior exam. Mild amount of free fluid is noted in the dependent portion of the pelvis. Stable cystic mass is noted in the left pelvis measuring 6.9 x 3.6 cm. Urinary bladder is decompressed. Mild anasarca is noted.  IMPRESSION: Interval development of moderate right hydroureteronephrosis without obstructing calculus; this most likely is due to stricture involving the distal right ureter.  Mild anasarca is noted.  Interval placement of biliary stent through common bile duct into duodenum. Pancreatic head appears to be enlarged compared to prior exam, concerning for inflammation or possibly neoplasm.  Mild bilateral pleural effusions are noted.  Interval development of ill-defined soft tissue density in the porta hepatis region concerning for adenopathy or neoplasm, which appears to be compressing the main portal vein and resulting  in portal hypertension and the development of collateral veins within the peritoneal space.  Mildly enlarged mesenteric adenopathy is again noted which appears to be slightly enlarged compared to prior exam and is concerning for metastatic  disease.  Stable left adnexal complex cystic abnormality compared to prior exam.   Electronically Signed   By: Sabino Dick M.D.   On: 01/22/2014 14:15   Dg Abd Acute W/chest  01/22/2014   CLINICAL DATA:  Acute chest and abdominal pain. Shortness of breath.  EXAM: ACUTE ABDOMEN SERIES (ABDOMEN 2 VIEW & CHEST 1 VIEW)  COMPARISON:  Chest radiograph January 16, 2014.  FINDINGS: Biliary stent is noted. No abnormal bowel dilatation is noted. Moderate stool burden is noted. No pneumoperitoneum is noted. Stable cardiomediastinal silhouette. Sternotomy wires are noted. Right subclavian Port-A-Cath is again noted with distal tip in expected position of right innominate vein. Minimal right basilar opacity is noted most consistent with subsegmental atelectasis. Minimal bilateral pleural effusions are noted.  IMPRESSION: Minimal bilateral pleural effusions. Minimal right basilar subsegmental atelectasis.  No evidence of bowel obstruction or ileus.   Electronically Signed   By: Sabino Dick M.D.   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 Metallic metal stent placed. Treated empirically for cholangitis, with 5 additional days of oral antibiotics prescribed at most recent discharge (Augmentin) Presenting this admission with shortness of breath and abdominal pain. CT findings noted; stent in good position, LFTs significantly improving, and lipase normal. No evidence for pancreatitis; concern remains for pancreatic neoplasm. Also notable ill-defined soft-tissue density in porta hepatis region concerning for neoplasm, causing compression on portal vein, which is likely culprit for her persistent abdominal pain. From a GI standpoint, no evidence for pancreatitis, and stent is in good position. Overall poor prognosis for patient; needs oncology  consultation and urology consultation.    Plan: Start PPI for GI prophylaxis Will request oncology input/consultation Supportive measures, pain control Diet as tolerated Urology consult as planned Agree with CA 19-9; will follow-up when available  Orvil Feil, ANP-BC [facility]     LOS: 0 days    01/22/2014, 3:50 PM  Attending note:  Patient seen in the ED. Reviewed today's CT with Dr. Thornton Papas. Biliary stent in excellent position with marked diminution in the upstream biliary dilation. LFTs markedly improved. There is now a fullness noted in the pancreatic head. No evidence of pancreatitis. Unfortunately, patient has developed interval portal vein compression with portal venous hypertension and development of collaterals along with new right hydronephrosis. I suspect portal veinous obstruction has more to do with her abdominal pain than anything else.  Would be worthwhile to get oncology input regarding portal vein obstruction as we have nothing specific to offer for that problem. In addition, urology consultation needed for potential ureteral stenting. Patient was noted to have an impressively long tight stricture through the pancreatic head at ERCP last week. Even though cytology brushings negative for malignancy,  I continue to be concerned that the patient may be harboring an occult pancreatic neoplasm or critical metastatic foci. Agree with checking a serum CA 19-9 level. Will follow with you.

## 2014-01-22 NOTE — H&P (Addendum)
PCP:   [name], MD   Chief Complaint:  Abdominal pain  HPI: 68 year old female who   has a past medical history of Cancer of unknown origin (11/14/2012); Cervical cancer (1984); Myocardial infarction (20 yrs ago); Coronary artery disease; Anxiety; Chronic back pain; Depression; GERD (gastroesophageal reflux disease); Hypertension; Hypothyroidism; Anemia; Vitamin B 12 deficiency; and Vitamin D deficiency disease. 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 She was recently discharged from the hospital on 01/17/2014 after patient underwent stent placement in the common bile duct, status post ERCP. Patient also had suspected cholangitis due to CBD stricture with obstructive jaundice. Patient also found to have mild hydronephrosis and was supposed to follow as outpatient urology. As per patient she developed abdominal pain and has had nausea and dry heaves since the discharge. She denies diarrhea, no fever. Patient also had poor by mouth intake due to the continuing abdominal pain. She denies chest pain, admits to having some shortness of breath. Admits to having intermittent dysuria. In the ED and her liver function tests were found to be normal, CT scan of the abdominal shows CBD stent in place with inflammation versus neoplasm of the pancreatic head. CT scan also showed interval movement of moderate right heart to ureter nephrosis without obstructing calculus.  Allergies:   Allergies  Allergen Reactions  . Avastin [Bevacizumab] Hives and Other (See Comments)    Took Prednisone along with Avastin to decrease hives  . Morphine And Related Other (See Comments)    Hallucinates with large IV doses.  . Reglan [Metoclopramide] Other (See Comments)    Hands shaking  . Sulfa Antibiotics Itching      Past Medical History  Diagnosis Date  . 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 Cervical cancer 1984  .  Myocardial infarction 20 yrs ago  . Coronary artery disease   . Anxiety   . Chronic back pain   . Depression   . GERD (gastroesophageal reflux disease)   . Hypertension   . Hypothyroidism   . Anemia   . Vitamin B 12 deficiency   . Vitamin D deficiency disease     Past Surgical History  Procedure Laterality Date  . Heart bypass      triple bypass with stents  . Coronary artery bypass graft      3 vessels  . Cardiac catheterization      stents x3  . Abdominal hysterectomy      partial  . Portacath placement Right 12/06/2012    Procedure: INSERTION PORT-A-CATH;  Surgeon: Donato Heinz, MD;  Location: [facility];  Service: General;  Laterality: Right;  . Ercp  01/15/2014    Procedure: ENDOSCOPIC RETROGRADE CHOLANGIOPANCREATOGRAPHY (ERCP);  Surgeon: Daneil Dolin, MD;  Location: [facility];  Service: Gastroenterology;;  . Sphincterotomy  01/15/2014    Procedure: Joan Mayans;  Surgeon: Daneil Dolin, MD;  Location: [facility];  Service: Gastroenterology;;  . Biliary stent placement  01/15/2014    Procedure: BILIARY WALL STENT PLACEMENT;  Surgeon: Daneil Dolin, MD;  Location: [facility];  Service: Gastroenterology;;  . Balloon dilation  01/15/2014    Procedure: [place];  Surgeon: Daneil Dolin, MD;  Location: [facility];  Service: Gastroenterology;;    Prior to Admission medications   Medication Sig Start Date End Date Taking? Authorizing Provider  amoxicillin-clavulanate (AUGMENTIN) 875-125 MG per tablet Take 1 tablet by mouth every 12 (twelve) hours. 01/17/14  Yes Samuella Cota, MD  aspirin 81 MG tablet Take 81 mg by mouth daily.   Yes Historical Provider, MD  clopidogrel (PLAVIX) 75 MG tablet Take 1 tablet (75 mg total) by mouth daily. Restart October 20. 01/17/14  Yes Samuella Cota, MD  cyclobenzaprine (FLEXERIL) 10 MG tablet Take 10 mg by mouth 3 (three) times daily as needed for muscle spasms.   Yes Historical Provider, MD  diphenhydrAMINE (NIGHT TIME SLEEP AID) 25 MG  tablet Take 25 mg by mouth at bedtime as needed for sleep.   Yes Historical Provider, MD  DULoxetine (CYMBALTA) 30 MG capsule Take 1 capsule (30 mg total) by mouth daily. 07/30/13  Yes Farrel Gobble, MD  furosemide (LASIX) 20 MG tablet Take 20 mg by mouth daily.    Yes Historical Provider, MD  hydrOXYzine (ATARAX/VISTARIL) 10 MG tablet Take 10 mg by mouth every 8 (eight) hours as needed (pruitus).    Yes Historical Provider, MD  isosorbide mononitrate (IMDUR) 60 MG 24 hr tablet Take 60 mg by mouth daily.    Yes Historical Provider, MD  magnesium hydroxide (MILK OF MAGNESIA) 400 MG/5ML suspension Take 30 mLs by mouth daily as needed for mild constipation.   Yes Historical Provider, MD  metoCLOPramide (REGLAN) 5 MG tablet Take 5 mg 3-4 times a day before meals and at bedtime to help with abdominal bloating. 01/02/14  Yes Farrel Gobble, MD  Multiple Vitamins-Minerals (HAIR/SKIN/NAILS PO) Take 1 tablet by mouth daily.   Yes Historical Provider, MD  nitroGLYCERIN (NITROSTAT) 0.6 MG SL tablet Place 0.4 mg under the tongue every 5 (five) minutes as needed for chest pain.    Yes Historical Provider, MD  ondansetron (ZOFRAN) 8 MG tablet Take 1 tablet by mouth every 8 hours as needed for nausea and vomiting 12/13/13  Yes Baird Cancer, PA-C  Oxycodone HCl 10 MG TABS Take 1 or 2 tablets every 2-4 hours as needed for pain. 01/02/14  Yes Farrel Gobble, MD  potassium chloride SA (K-DUR,KLOR-CON) 20 MEQ tablet Take 1 tablet (20 mEq total) by mouth 2 (two) times daily. 12/26/13  Yes Farrel Gobble, MD  predniSONE (DELTASONE) 5 MG tablet Take 5 mg by mouth daily with breakfast.  07/09/13  Yes Farrel Gobble, MD  prochlorperazine (COMPAZINE) 10 MG tablet Take 1 tablet (10 mg total) by mouth every 6 (six) hours as needed for nausea or vomiting. 12/19/13  Yes [name] Kefalas, PA-C  senna-docusate (SENOKOT-S) 8.6-50 MG per tablet Take 3 tablets by mouth at bedtime.   Yes Historical Provider, MD    Social  History:  reports that she quit smoking about 25 years ago. Her smoking use included Cigarettes. She has a 44 pack-year smoking history. She has never used smokeless tobacco. She reports that she does not drink alcohol or use illicit drugs.  Family History  Problem Relation Age of Onset  . Heart attack Mother   . Heart attack Father   . Colon cancer Paternal Grandmother      All the positives are listed in BOLD  Review of Systems:  HEENT: Headache, blurred vision, runny nose, sore throat Neck: Hypothyroidism, hyperthyroidism,,lymphadenopathy Chest : Shortness of breath, history of COPD, Asthma Heart : Chest pain, history of coronary arterey disease GI:  Nausea, vomiting, diarrhea, constipation, GERD GU: Dysuria, urgency, frequency of urination, hematuria Neuro: Stroke, seizures, syncope Psych: Depression, anxiety, hallucinations   Physical Exam: Blood pressure 134/84, pulse 86, temperature 98.3 F (36.8 C), temperature source Oral, resp. rate 17, height 5'  4" (1.626 m), weight 61.236 kg (135 lb), SpO2 98.00%. Constitutional:   Patient is a well-developed and well-nourished female in* in no acute distress and cooperative with exam. Head: Normocephalic and atraumatic Mouth: Mucus membranes moist Eyes: PERRL, EOMI, conjunctivae normal Neck: Supple, No Thyromegaly Cardiovascular: RRR, S1 normal, S2 normal Pulmonary/Chest: CTAB, no wheezes, rales, or rhonchi Abdominal: Soft. Positive epigastric tenderness to palpation, non-distended, bowel sounds are normal, no masses, organomegaly, or guarding present.  Neurological: A&O x3, Strenght is normal and symmetric bilaterally, cranial nerve II-XII are grossly intact, no focal motor deficit, sensory intact to light touch bilaterally.  Extremities : No Cyanosis, Clubbing or Edema  Labs on Admission:  Basic Metabolic Panel:  Recent Labs Lab 01/16/14 0544 01/17/14 0448 01/22/14 1051  NA 137 136* 141  K 4.0 3.5* 3.5*  CL 99 96 95*    CO2 19 25 35*  GLUCOSE 131* 127* 103*  BUN 14 15 15   CREATININE 1.09 1.07 0.92  CALCIUM 9.0 8.8 9.3   Liver Function Tests:  Recent Labs Lab 01/16/14 0544 01/17/14 0448 01/22/14 1051  AST 64* 30 31  ALT 75* 51* 24  ALKPHOS 800* 641* 323*  BILITOT 2.4* 1.9* 1.0  PROT 6.4 6.2 6.7  ALBUMIN 2.1* 2.1* 2.4*    Recent Labs Lab 01/22/14 1051  LIPASE 33   No results found for this basename: AMMONIA,  in the last 168 hours CBC:  Recent Labs Lab 01/16/14 0544 01/22/14 1051  WBC 8.2 5.2  NEUTROABS  --  3.8  HGB 9.3* 8.6*  HCT 29.4* 27.7*  MCV 92.5 93.9  PLT 281 242   Cardiac Enzymes: No results found for this basename: CKTOTAL, CKMB, CKMBINDEX, TROPONINI,  in the last 168 hours  BNP (last 3 results)  Recent Labs  02/10/13 1655  PROBNP 193.2*   CBG: No results found for this basename: GLUCAP,  in the last 168 hours  Radiological Exams on Admission: Ct Abdomen Pelvis W Contrast  01/22/2014   CLINICAL DATA:  Generalized abdominal pain.  EXAM: CT ABDOMEN AND PELVIS WITH CONTRAST  TECHNIQUE: Multidetector CT imaging of the abdomen and pelvis was performed using the standard protocol following bolus administration of intravenous contrast.  CONTRAST:  38mL OMNIPAQUE IOHEXOL 300 MG/ML SOLN, 150mL OMNIPAQUE IOHEXOL 300 MG/ML SOLN  COMPARISON:  CT scan of November 19, 2013.  FINDINGS: Severe degenerative disc disease is noted at L4-5 and L5-S1. Mild bilateral pleural effusions are now noted.  Large amount of contrast is noted within the gallbladder. There has been interval placement of biliary stent through common bile duct into duodenum. Pneumobilia is noted in the left hepatic lobe. Spleen appears normal. The pancreatic head appears to be enlarged compared to prior exam, concerning for neoplasm or inflammation. Adrenal glands appear normal. Left kidney and ureter appear normal. Moderate right hydroureteronephrosis is noted without evidence of obstructing calculus. Distal right  ureter appears to be normal in caliber, concerning for stricture. Atherosclerotic calcifications of abdominal aorta are noted without aneurysmal dilatation.  There is no evidence of bowel obstruction. There is the interval development of collateral veins in the in the anterior portion of the peritoneal space suggesting portal hypertension. This appears to be due to ill-defined soft tissue density in the porta hepatis measuring 3.1 x 2.9 cm consistent with adenopathy, which is compressing the main portal vein. Mildly enlarged lymph nodes are noted in the mesenteries with the largest measuring 13 x 11 mm in the right lower quadrant, which is increased in size compared  to prior exam. Mild amount of free fluid is noted in the dependent portion of the pelvis. Stable cystic mass is noted in the left pelvis measuring 6.9 x 3.6 cm. Urinary bladder is decompressed. Mild anasarca is noted.  IMPRESSION: Interval development of moderate right hydroureteronephrosis without obstructing calculus; this most likely is due to stricture involving the distal right ureter.  Mild anasarca is noted.  Interval placement of biliary stent through common bile duct into duodenum. Pancreatic head appears to be enlarged compared to prior exam, concerning for inflammation or possibly neoplasm.  Mild bilateral pleural effusions are noted.  Interval development of ill-defined soft tissue density in the porta hepatis region concerning for adenopathy or neoplasm, which appears to be compressing the main portal vein and resulting in portal hypertension and the development of collateral veins within the peritoneal space.  Mildly enlarged mesenteric adenopathy is again noted which appears to be slightly enlarged compared to prior exam and is concerning for metastatic disease.  Stable left adnexal complex cystic abnormality compared to prior exam.   Electronically Signed   By: Sabino Dick M.D.   On: 01/22/2014 14:15   Dg Abd Acute  W/chest  01/22/2014   CLINICAL DATA:  Acute chest and abdominal pain. Shortness of breath.  EXAM: ACUTE ABDOMEN SERIES (ABDOMEN 2 VIEW & CHEST 1 VIEW)  COMPARISON:  Chest radiograph January 16, 2014.  FINDINGS: Biliary stent is noted. No abnormal bowel dilatation is noted. Moderate stool burden is noted. No pneumoperitoneum is noted. Stable cardiomediastinal silhouette. Sternotomy wires are noted. Right subclavian Port-A-Cath is again noted with distal tip in expected position of right innominate vein. Minimal right basilar opacity is noted most consistent with subsegmental atelectasis. Minimal bilateral pleural effusions are noted.  IMPRESSION: Minimal bilateral pleural effusions. Minimal right basilar subsegmental atelectasis.  No evidence of bowel obstruction or ileus.   Electronically Signed   By: Sabino Dick M.D.   On: 01/22/2014 12:29       Assessment/Plan Active Problems:   Cancer of unknown origin   Cholangitis   Dilated cbd, acquired   Hydronephrosis of right kidney   Hydronephrosis  ? Pancreatitis versus pancreas neoplasm - Lipase and LFTs are within normal range, will get CA 19-9. Keep n.p.o. start IV normal saline at 75 mL per hour. GI consultation, and called and discussed with Dr. Gala Romney, who will follow the patient today. Dilaudid when necessary for pain Zofran when necessary for nausea vomiting Reglan 10 mg IV every 6 hours.  Hydronephrosis of the right kidney CT scan shows interval development of hydroureter nephrosis, likely due to stricture. There is no obstructing calculus seen. Will obtain urology consultation.  ? Pneumonia Chest x-ray does not show pneumonia Patient was prescribed Augmentin at the time of discharge, which she did not take today due to nausea and vomiting. We'll start Rocephin and Zithromax.  Cancer of unknown origin Patient to followup oncology as outpatient.  CAD- Will hold the plavix, in case she needs a GI procedure. If no procedure consider  starting plavix in am.  Code status: Patient is full code  Family discussion: Admission, patients condition and plan of care including tests being ordered have been discussed with the patient and her daughter and brother in law  at bedside* who indicate understanding and agree with the plan and Code Status.   Time Spent on Admission: 60 minutes  [facility] Pager: [phone] 01/22/2014, 3:37 PM  If 7PM-7AM, please contact night-coverage  [email or web address]  Password [email or web address]

## 2014-01-22 NOTE — ED Notes (Signed)
PT reports increasing SOB with exertion x1 week and increased lower peripheral edema. PT is seen by [facility] for cancer metastasis to abdomen.

## 2014-01-22 NOTE — Progress Notes (Signed)
Late entry for 2200 01/22/14, went in to give patient pain medicine that was available at 2150, patient was asleep. Will continue to monitor patient and medicate when it is requested.

## 2014-01-23 ENCOUNTER — Ambulatory Visit (HOSPITAL_COMMUNITY): Payer: PRIVATE HEALTH INSURANCE

## 2014-01-23 ENCOUNTER — Inpatient Hospital Stay (HOSPITAL_COMMUNITY): Payer: PRIVATE HEALTH INSURANCE

## 2014-01-23 DIAGNOSIS — R591 Generalized enlarged lymph nodes: Secondary | ICD-10-CM

## 2014-01-23 DIAGNOSIS — N1330 Unspecified hydronephrosis: Secondary | ICD-10-CM

## 2014-01-23 DIAGNOSIS — R609 Edema, unspecified: Secondary | ICD-10-CM

## 2014-01-23 DIAGNOSIS — K922 Gastrointestinal hemorrhage, unspecified: Secondary | ICD-10-CM

## 2014-01-23 DIAGNOSIS — C786 Secondary malignant neoplasm of retroperitoneum and peritoneum: Principal | ICD-10-CM

## 2014-01-23 DIAGNOSIS — C569 Malignant neoplasm of unspecified ovary: Secondary | ICD-10-CM

## 2014-01-23 DIAGNOSIS — E43 Unspecified severe protein-calorie malnutrition: Secondary | ICD-10-CM

## 2014-01-23 DIAGNOSIS — R1013 Epigastric pain: Secondary | ICD-10-CM

## 2014-01-23 LAB — COMPREHENSIVE METABOLIC PANEL
ALT: 20 U/L (ref 0–35)
ANION GAP: 12 (ref 5–15)
AST: 23 U/L (ref 0–37)
Albumin: 2.3 g/dL — ABNORMAL LOW (ref 3.5–5.2)
Alkaline Phosphatase: 286 U/L — ABNORMAL HIGH (ref 39–117)
BILIRUBIN TOTAL: 1 mg/dL (ref 0.3–1.2)
BUN: 13 mg/dL (ref 6–23)
CALCIUM: 8.5 mg/dL (ref 8.4–10.5)
CHLORIDE: 95 meq/L — AB (ref 96–112)
CO2: 31 mEq/L (ref 19–32)
CREATININE: 0.99 mg/dL (ref 0.50–1.10)
GFR calc Af Amer: 66 mL/min — ABNORMAL LOW (ref >90)
GFR, EST NON AFRICAN AMERICAN: 57 mL/min — AB (ref >90)
Glucose, Bld: 102 mg/dL — ABNORMAL HIGH (ref 70–99)
Potassium: 3.7 mEq/L (ref 3.7–5.3)
Sodium: 138 mEq/L (ref 137–147)
Total Protein: 6 g/dL (ref 6.0–8.3)

## 2014-01-23 LAB — CBC
HEMATOCRIT: 25.4 % — AB (ref 36.0–46.0)
Hemoglobin: 7.9 g/dL — ABNORMAL LOW (ref 12.0–15.0)
MCH: 29 pg (ref 26.0–34.0)
MCHC: 31.1 g/dL (ref 30.0–36.0)
MCV: 93.4 fL (ref 78.0–100.0)
PLATELETS: 269 10*3/uL (ref 150–400)
RBC: 2.72 MIL/uL — ABNORMAL LOW (ref 3.87–5.11)
RDW: 19 % — ABNORMAL HIGH (ref 11.5–15.5)
WBC: 4.6 10*3/uL (ref 4.0–10.5)

## 2014-01-23 LAB — PREPARE RBC (CROSSMATCH)

## 2014-01-23 LAB — CANCER ANTIGEN 19-9: CA 19-9: 35.1 U/mL — ABNORMAL HIGH (ref <35.0)

## 2014-01-23 MED ORDER — ACETAMINOPHEN 325 MG PO TABS
650.0000 mg | ORAL_TABLET | Freq: Once | ORAL | Status: AC
  Administered 2014-01-23: 650 mg via ORAL
  Filled 2014-01-23: qty 2

## 2014-01-23 MED ORDER — SODIUM CHLORIDE 0.9 % IJ SOLN: 10.0000 mL | INTRAMUSCULAR | Status: DC | PRN

## 2014-01-23 MED ORDER — SODIUM CHLORIDE 0.9 % IV SOLN
250.0000 mL | Freq: Once | INTRAVENOUS | Status: AC
  Administered 2014-01-23: 250 mL via INTRAVENOUS

## 2014-01-23 MED ORDER — ALPRAZOLAM 0.25 MG PO TABS
0.2500 mg | ORAL_TABLET | Freq: Four times a day (QID) | ORAL | Status: DC | PRN
  Administered 2014-01-23 – 2014-01-24 (×3): 0.25 mg via ORAL
  Filled 2014-01-23 (×3): qty 1

## 2014-01-23 MED ORDER — HEPARIN SOD (PORK) LOCK FLUSH 100 UNIT/ML IV SOLN
500.0000 [IU] | Freq: Every day | INTRAVENOUS | Status: DC | PRN
  Filled 2014-01-23: qty 5

## 2014-01-23 MED ORDER — SODIUM CHLORIDE 0.9 % IJ SOLN: 3.0000 mL | INTRAMUSCULAR | Status: DC | PRN

## 2014-01-23 MED ORDER — ONDANSETRON 4 MG PO TBDP
4.0000 mg | ORAL_TABLET | Freq: Three times a day (TID) | ORAL | Status: DC
  Administered 2014-01-23 – 2014-01-27 (×19): 4 mg via ORAL
  Filled 2014-01-23 (×23): qty 1

## 2014-01-23 MED ORDER — DIPHENHYDRAMINE HCL 25 MG PO CAPS
25.0000 mg | ORAL_CAPSULE | Freq: Once | ORAL | Status: AC
  Administered 2014-01-23: 25 mg via ORAL
  Filled 2014-01-23: qty 1

## 2014-01-23 MED ORDER — PROCHLORPERAZINE EDISYLATE 5 MG/ML IJ SOLN
10.0000 mg | Freq: Four times a day (QID) | INTRAMUSCULAR | Status: DC | PRN
  Filled 2014-01-23: qty 2

## 2014-01-23 MED ORDER — FUROSEMIDE 10 MG/ML IJ SOLN
20.0000 mg | Freq: Two times a day (BID) | INTRAMUSCULAR | Status: DC
  Administered 2014-01-23 – 2014-01-24 (×3): 20 mg via INTRAVENOUS
  Filled 2014-01-23 (×3): qty 2

## 2014-01-23 NOTE — Progress Notes (Signed)
UR chart review completed.  

## 2014-01-23 NOTE — Consult Note (Addendum)
[facility] Consultation Oncology  Name: Erika Moreno      MRN: [id number]    Location: A301/A301-01  Date: 01/23/2014 Time:4:03 PM   REFERRING PHYSICIAN:  Orvil Feil, NP  REASON FOR CONSULT:  Interval development of ill-defined soft tissue density in the porta hepatis region, causing compression of portal vein, portal hypertension. Patient well-known to your service. 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  HISTORY OF PRESENT ILLNESS:   Ms. 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 Results from [place] demonstrated a mutation in her malignancy that may respond to Afinitor (mTor inhibitor). With this data, we got Afinitor approved and it was delivered to her home on Friday, 01/11/2014. She has not yet started the medication due to hospitalization requiring ERCP 10/13 with biliary stricture and upstream dilation of biliary tree, s/p sphincterotomy and balloon stricture, dilation, brushing for cytology, and expandable metallic stent placed. Cytology brushing negative for malignancy. Respiratory failure after anesthesia requiring intubation; however, she improved within 24 hours and was extubated.  She reports to [facility] ED on 01/22/2014 with shortness of breath.    Cancer of unknown origin   11/15/2012 - 02/21/2013 Chemotherapy Carboplatin/Paclitaxel   11/15/2012 Treatment Plan Change Transfer of care to [facility] from [place]   03/22/2013 Remission PET scan- CR   04/16/2013 - 08/20/2013 Chemotherapy Avastin maintenance every 21 days   08/29/2013  Progression PET- progression of disease   09/05/2013 Imaging MUGA demonstrates EF of 63%   09/10/2013 - 12/19/2013 Chemotherapy Doxil every 28 days   12/12/2013 Surgery Biopsy axillary lymph node with foundation One analysis revealing possible sensitivity to mTOR inhibitor.   01/11/2014 Treatment Plan Change Afinitor approved and delivered 10/9 due to [facility] testing results.  (Not yet started to date).   01/11/2014 - 01/17/2014 Hospital Admission Admitted 2nd to jaundice and elevated LFTs; ERCP 10/13 with biliary stricture and upstream dilation of biliary tree, s/p sphincterotomy and balloon stricture, dilation, brushing for cytology, and expandable metallic stent. Complicated by re-intubation.   01/22/2014 -  Hospital Admission Shortness of breath is chief complaint. CT scan show soft-tissue density in porta hepatis region concerning for neoplasm, causing compression on portal vein, which is likely culprit for her persistent abdominal pain   01/22/2014 Imaging CT abd/pelvis- Mildly enlarged mesenteric adenopathy is again noted which appears to be slightly enlarged concerning for metastatic disease. Ill-defined soft tissue mass compressing main portal vein causing portal HTN and collateral veins    I personally reviewed and went over laboratory results with the patient.  The results are noted within this dictation.  I personally reviewed and went over radiographic studies with the patient.  The results are noted within this dictation.  Ct abd/pelvis on 10/20 demonstrates: Interval development of moderate right hydroureteronephrosis without  obstructing calculus; this most likely is due to stricture involving  the distal right ureter.  Mild anasarca is noted.  Interval placement of biliary stent through common bile duct into  duodenum. Pancreatic head appears to be enlarged compared to prior  exam, concerning for inflammation or possibly neoplasm.  Mild bilateral pleural effusions are noted.   Interval development of ill-defined  soft tissue density in the porta  hepatis region concerning for adenopathy or neoplasm, which appears  to be compressing the main portal vein and resulting in portal  hypertension and the development of collateral veins within the  peritoneal space.  Mildly enlarged mesenteric adenopathy is again noted which appears  to be slightly enlarged compared to prior exam and is concerning for  metastatic disease.  Stable left adnexal complex cystic abnormality compared to prior  exam.  Chart reviewed.  GI and nephrology input appreciated.  Urology consult pending.  I had a frank conversation regarding her recent tests and their results.  She is informed that all chemotherapy is on hold while in the hospital.  Since she is not eating, Afinitor treatment option is cancelled due to absorption issues.  She is offered systemic chemotherapy with single-agent Irinotecan as an outpatient.  She is quoted a response rate in this setting of 10-15%.  I cannot quote a response rate of Afinitor in this setting since it is an innovative treatment.  I did broach the topic of code status; "I am addressing that right now."  I also broached the topic of comfort care and Hospice.  Patient listened with little input on this topic.    PAST MEDICAL HISTORY:   Past Medical History  Diagnosis Date  . 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 Cervical cancer 1984  . Myocardial infarction 20 yrs ago  . Coronary artery disease   . Anxiety   . Chronic back pain   . Depression   . GERD (gastroesophageal reflux disease)   . Hypertension   . Hypothyroidism   . Anemia   . Vitamin B 12 deficiency   . Vitamin D deficiency disease     ALLERGIES: Allergies  Allergen Reactions  . Avastin [Bevacizumab] Hives and Other (See Comments)    Took Prednisone along with Avastin to decrease hives  . Morphine And Related Other (See Comments)    Hallucinates with  large IV doses.  . Reglan [Metoclopramide] Other (See Comments)    Hands shaking  . Sulfa Antibiotics Itching      MEDICATIONS: I have reviewed the patient's current medications.     PAST SURGICAL HISTORY Past Surgical History  Procedure Laterality Date  . Heart bypass      triple bypass with stents  . Coronary artery bypass graft      3 vessels  . Cardiac catheterization      stents x3  . Abdominal hysterectomy      partial  . Portacath placement Right 12/06/2012    Procedure: INSERTION PORT-A-CATH;  Surgeon: Donato Heinz, MD;  Location: [facility];  Service: General;  Laterality: Right;  . Ercp  01/15/2014    Dr. Gala Romney: biliary stricture and upstream dilation of biliary tree, s/p sphincterotomy and balloon stricture, dilation, brushing for cytology, and expandable metallic stent placed  . Sphincterotomy  01/15/2014    Procedure: SPHINCTEROTOMY;  Surgeon: Daneil Dolin, MD;  Location: [facility];  Service: Gastroenterology;;  . Biliary stent placement  01/15/2014    Procedure: [place];  Surgeon: Daneil Dolin, MD;  Location: [facility];  Service: Gastroenterology;;  . Balloon dilation  01/15/2014    Procedure: [place];  Surgeon: Daneil Dolin, MD;  Location: [facility];  Service: Gastroenterology;;    FAMILY HISTORY: Family History  Problem Relation Age of Onset  . Heart attack Mother   . Heart attack Father   .  Colon cancer Paternal Grandmother     SOCIAL HISTORY:  reports that she quit smoking about 25 years ago. Her smoking use included Cigarettes. She has a 44 pack-year smoking history. She has never used smokeless tobacco. She reports that she does not drink alcohol or use illicit drugs.  PERFORMANCE STATUS: The patient's performance status is 2 - Symptomatic, <50% confined to bed  PHYSICAL EXAM: Most Recent Vital Signs: Blood pressure 117/91, pulse 73, temperature 99.5 F (37.5 C), temperature source Oral, resp. rate 20, height $RemoveBe'5\' 4"'CAGycrugE$  (1.626 m),  weight 145 lb 8.1 oz (66 kg), SpO2 100.00%. General appearance: alert, cooperative and mild distress Head: Normocephalic, without obvious abnormality, atraumatic Extremities: B/L LE edema, with left arm edema Neurologic: Grossly normal  LABORATORY DATA:  Results for orders placed during the hospital encounter of 01/22/14 (from the past 48 hour(s))  CANCER ANTIGEN 19-9     Status: Abnormal   Collection Time    01/22/14 10:46 AM      Result Value Ref Range   CA 19-9 35.1 (*) <35.0 U/mL   Comment: Performed at [facility]     Status: Abnormal   Collection Time    01/22/14 10:51 AM      Result Value Ref Range   Sodium 141  137 - 147 mEq/L   Potassium 3.5 (*) 3.7 - 5.3 mEq/L   Chloride 95 (*) 96 - 112 mEq/L   CO2 35 (*) 19 - 32 mEq/L   Glucose, Bld 103 (*) 70 - 99 mg/dL   BUN 15  6 - 23 mg/dL   Creatinine, Ser 0.92  0.50 - 1.10 mg/dL   Calcium 9.3  8.4 - 10.5 mg/dL   Total Protein 6.7  6.0 - 8.3 g/dL   Albumin 2.4 (*) 3.5 - 5.2 g/dL   AST 31  0 - 37 U/L   ALT 24  0 - 35 U/L   Alkaline Phosphatase 323 (*) 39 - 117 U/L   Total Bilirubin 1.0  0.3 - 1.2 mg/dL   GFR calc non Af Amer 63 (*) >90 mL/min   GFR calc Af Amer 72 (*) >90 mL/min   Comment: (NOTE)     The eGFR has been calculated using the CKD EPI equation.     This calculation has not been validated in all clinical situations.     eGFR's persistently <90 mL/min signify possible Chronic Kidney     Disease.   Anion gap 11  5 - 15  CBC WITH DIFFERENTIAL     Status: Abnormal   Collection Time    01/22/14 10:51 AM      Result Value Ref Range   WBC 5.2  4.0 - 10.5 K/uL   RBC 2.95 (*) 3.87 - 5.11 MIL/uL   Hemoglobin 8.6 (*) 12.0 - 15.0 g/dL   HCT 27.7 (*) 36.0 - 46.0 %   MCV 93.9  78.0 - 100.0 fL   MCH 29.2  26.0 - 34.0 pg   MCHC 31.0  30.0 - 36.0 g/dL   RDW 18.7 (*) 11.5 - 15.5 %   Platelets 242  150 - 400 K/uL   Neutrophils Relative % 74  43 - 77 %   Neutro Abs 3.8  1.7 - 7.7 K/uL    Lymphocytes Relative 14  12 - 46 %   Lymphs Abs 0.7  0.7 - 4.0 K/uL   Monocytes Relative 11  3 - 12 %   Monocytes Absolute 0.6  0.1 -  1.0 K/uL   Eosinophils Relative 1  0 - 5 %   Eosinophils Absolute 0.1  0.0 - 0.7 K/uL   Basophils Relative 0  0 - 1 %   Basophils Absolute 0.0  0.0 - 0.1 K/uL  LIPASE, BLOOD     Status: None   Collection Time    01/22/14 10:51 AM      Result Value Ref Range   Lipase 33  11 - 59 U/L  I-STAT TROPOININ, ED     Status: None   Collection Time    01/22/14 10:53 AM      Result Value Ref Range   Troponin i, poc 0.08  0.00 - 0.08 ng/mL   Comment 3            Comment: Due to the release kinetics of cTnI,     a negative result within the first hours     of the onset of symptoms does not rule out     myocardial infarction with certainty.     If myocardial infarction is still suspected,     repeat the test at appropriate intervals.  I-STAT CG4 LACTIC ACID, ED     Status: None   Collection Time    01/22/14 10:56 AM      Result Value Ref Range   Lactic Acid, Venous 0.57  0.5 - 2.2 mmol/L  URINALYSIS, ROUTINE W REFLEX MICROSCOPIC     Status: None   Collection Time    01/22/14  1:27 PM      Result Value Ref Range   Color, Urine YELLOW  YELLOW   APPearance CLEAR  CLEAR   Specific Gravity, Urine 1.015  1.005 - 1.030   pH 8.0  5.0 - 8.0   Glucose, UA NEGATIVE  NEGATIVE mg/dL   Hgb urine dipstick NEGATIVE  NEGATIVE   Bilirubin Urine NEGATIVE  NEGATIVE   Ketones, ur NEGATIVE  NEGATIVE mg/dL   Protein, ur NEGATIVE  NEGATIVE mg/dL   Urobilinogen, UA 1.0  0.0 - 1.0 mg/dL   Nitrite NEGATIVE  NEGATIVE   Leukocytes, UA NEGATIVE  NEGATIVE   Comment: MICROSCOPIC NOT DONE ON URINES WITH NEGATIVE PROTEIN, BLOOD, LEUKOCYTES, NITRITE, OR GLUCOSE <1000 mg/dL.  CBC     Status: Abnormal   Collection Time    01/23/14  6:37 AM      Result Value Ref Range   WBC 4.6  4.0 - 10.5 K/uL   RBC 2.72 (*) 3.87 - 5.11 MIL/uL   Hemoglobin 7.9 (*) 12.0 - 15.0 g/dL   HCT 25.4 (*)  36.0 - 46.0 %   MCV 93.4  78.0 - 100.0 fL   MCH 29.0  26.0 - 34.0 pg   MCHC 31.1  30.0 - 36.0 g/dL   RDW 19.0 (*) 11.5 - 15.5 %   Platelets 269  150 - 400 K/uL  COMPREHENSIVE METABOLIC PANEL     Status: Abnormal   Collection Time    01/23/14  6:37 AM      Result Value Ref Range   Sodium 138  137 - 147 mEq/L   Potassium 3.7  3.7 - 5.3 mEq/L   Chloride 95 (*) 96 - 112 mEq/L   CO2 31  19 - 32 mEq/L   Glucose, Bld 102 (*) 70 - 99 mg/dL   BUN 13  6 - 23 mg/dL   Creatinine, Ser 0.99  0.50 - 1.10 mg/dL   Calcium 8.5  8.4 - 10.5 mg/dL   Total Protein 6.0  6.0 -  8.3 g/dL   Albumin 2.3 (*) 3.5 - 5.2 g/dL   AST 23  0 - 37 U/L   ALT 20  0 - 35 U/L   Alkaline Phosphatase 286 (*) 39 - 117 U/L   Total Bilirubin 1.0  0.3 - 1.2 mg/dL   GFR calc non Af Amer 57 (*) >90 mL/min   GFR calc Af Amer 66 (*) >90 mL/min   Comment: (NOTE)     The eGFR has been calculated using the CKD EPI equation.     This calculation has not been validated in all clinical situations.     eGFR's persistently <90 mL/min signify possible Chronic Kidney     Disease.   Anion gap 12  5 - 15      RADIOGRAPHY: Ct Abdomen Pelvis W Contrast  01/22/2014   CLINICAL DATA:  Generalized abdominal pain.  EXAM: CT ABDOMEN AND PELVIS WITH CONTRAST  TECHNIQUE: Multidetector CT imaging of the abdomen and pelvis was performed using the standard protocol following bolus administration of intravenous contrast.  CONTRAST:  64mL OMNIPAQUE IOHEXOL 300 MG/ML SOLN, 173mL OMNIPAQUE IOHEXOL 300 MG/ML SOLN  COMPARISON:  CT scan of November 19, 2013.  FINDINGS: Severe degenerative disc disease is noted at L4-5 and L5-S1. Mild bilateral pleural effusions are now noted.  Large amount of contrast is noted within the gallbladder. There has been interval placement of biliary stent through common bile duct into duodenum. Pneumobilia is noted in the left hepatic lobe. Spleen appears normal. The pancreatic head appears to be enlarged compared to prior exam,  concerning for neoplasm or inflammation. Adrenal glands appear normal. Left kidney and ureter appear normal. Moderate right hydroureteronephrosis is noted without evidence of obstructing calculus. Distal right ureter appears to be normal in caliber, concerning for stricture. Atherosclerotic calcifications of abdominal aorta are noted without aneurysmal dilatation.  There is no evidence of bowel obstruction. There is the interval development of collateral veins in the in the anterior portion of the peritoneal space suggesting portal hypertension. This appears to be due to ill-defined soft tissue density in the porta hepatis measuring 3.1 x 2.9 cm consistent with adenopathy, which is compressing the main portal vein. Mildly enlarged lymph nodes are noted in the mesenteries with the largest measuring 13 x 11 mm in the right lower quadrant, which is increased in size compared to prior exam. Mild amount of free fluid is noted in the dependent portion of the pelvis. Stable cystic mass is noted in the left pelvis measuring 6.9 x 3.6 cm. Urinary bladder is decompressed. Mild anasarca is noted.  IMPRESSION: Interval development of moderate right hydroureteronephrosis without obstructing calculus; this most likely is due to stricture involving the distal right ureter.  Mild anasarca is noted.  Interval placement of biliary stent through common bile duct into duodenum. Pancreatic head appears to be enlarged compared to prior exam, concerning for inflammation or possibly neoplasm.  Mild bilateral pleural effusions are noted.  Interval development of ill-defined soft tissue density in the porta hepatis region concerning for adenopathy or neoplasm, which appears to be compressing the main portal vein and resulting in portal hypertension and the development of collateral veins within the peritoneal space.  Mildly enlarged mesenteric adenopathy is again noted which appears to be slightly enlarged compared to prior exam and is  concerning for metastatic disease.  Stable left adnexal complex cystic abnormality compared to prior exam.   Electronically Signed   By: Sabino Dick M.D.   On: 01/22/2014 14:15  Dg Abd Acute W/chest  01/22/2014   CLINICAL DATA:  Acute chest and abdominal pain. Shortness of breath.  EXAM: ACUTE ABDOMEN SERIES (ABDOMEN 2 VIEW & CHEST 1 VIEW)  COMPARISON:  Chest radiograph January 16, 2014.  FINDINGS: Biliary stent is noted. No abnormal bowel dilatation is noted. Moderate stool burden is noted. No pneumoperitoneum is noted. Stable cardiomediastinal silhouette. Sternotomy wires are noted. Right subclavian Port-A-Cath is again noted with distal tip in expected position of right innominate vein. Minimal right basilar opacity is noted most consistent with subsegmental atelectasis. Minimal bilateral pleural effusions are noted.  IMPRESSION: Minimal bilateral pleural effusions. Minimal right basilar subsegmental atelectasis.  No evidence of bowel obstruction or ileus.   Electronically Signed   By: Sabino Dick M.D.   On: 01/22/2014 12:29       PATHOLOGY:  Nothing new  ASSESSMENT:  1. 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 Results from [place] demonstrated a mutation in her malignancy that may respond to Afinitor (mTor inhibitor). With this data, we got Afinitor approved and it was delivered to her home on Friday, 01/11/2014. She has not yet started the medication due to hospitalization  2. Ill-defined soft tissue density in the porta hepatis region concerning for adenopathy or neoplasm, which appears to be compressing the main portal vein and resulting in portal hypertension and the development of collateral veins within the peritoneal space. 3. Mildly enlarged mesenteric adenopathy is again noted which appears  to be slightly enlarged compared to  prior exam and is concerning for  metastatic disease. 4. Interval development of moderate right hydroureteronephrosis without  obstructing calculus 5. Normocytic, normochromic anemia probably due to previous chemotherapy.   PLAN:  1. I personally reviewed and went over laboratory results with the patient.  The results are noted within this dictation. 2. I personally reviewed and went over radiographic studies with the patient.  The results are noted within this dictation.   3. Chart reviewed 4. GI input appreciated 5. Nephrology input appreciated 6. Transfusion of 2 units PRBCs today/tomorrow. 7. Progression of disease noted on imaging studies.  Discussed with patient. 8. Pancreatic abnormality of CT is noted, but work-up is futile given her clinical scenario with metastatic ovarian cancer with peritoneal carcinomatosis.  The later is her major mortality. 9. Urology consult is pending. 10. Patient is completing advanced directive and living will.   11. Discussion regarding options:  A. Treatment with Irinotecan  B. Hospice. 12. She would like to hear what urology has to offer before making decisions moving forward. 69. [place] of left upper extremity for unilateral edema to evaluate for DVT 14. Will follow along as an inpatient.  Chemotherapy on hold while in the hospital.  All questions were answered. The patient knows to call the clinic with any problems, questions or concerns. We can certainly see the patient much sooner if necessary.  Patient and plan discussed with Dr. Farrel Gobble and he is in agreement with the aforementioned.   KEFALAS,THOMAS 01/23/2014

## 2014-01-23 NOTE — Progress Notes (Addendum)
Subjective: Feels nauseated. Ate a few bites of jello. Continued epigastric pain, intermittent.   Objective: Vital signs in last 24 hours: Temp:  [98.3 F (36.8 C)-98.6 F (37 C)] 98.6 F (37 C) (10/21 0554) Pulse Rate:  [70-105] 70 (10/21 0554) Resp:  [15-26] 20 (10/21 0554) BP: (107-155)/(54-94) 133/81 mmHg (10/21 0554) SpO2:  [92 %-100 %] 93 % (10/21 0554) Weight:  [135 lb (61.236 kg)-145 lb 8.1 oz (66 kg)] 145 lb 8.1 oz (66 kg) (10/20 1701) Last BM Date: 01/23/14 General:   Alert and oriented, pleasant Head:  Normocephalic and atraumatic. Abdomen:  Bowel sounds present, full, TTP upper abdomen  Msk:  Symmetrical without gross deformities. Normal posture. Extremities:  3+ lower extremity edema and pedal edema Neurologic:  Alert and  oriented x4;  grossly normal neurologically. Skin:  Warm and dry, intact without significant lesions.  Psych:  Alert and cooperative. Normal mood and affect.  Intake/Output from previous day: 10/20 0701 - 10/21 0700 In: 1391.3 [P.O.:120; I.V.:971.3; IV Piggyback:300] Out: -  Intake/Output this shift:    Lab Results:  Recent Labs  01/22/14 1051 01/23/14 0637  WBC 5.2 4.6  HGB 8.6* 7.9*  HCT 27.7* 25.4*  PLT 242 269   BMET  Recent Labs  01/22/14 1051 01/23/14 0637  NA 141 138  K 3.5* 3.7  CL 95* 95*  CO2 35* 31  GLUCOSE 103* 102*  BUN 15 13  CREATININE 0.92 0.99  CALCIUM 9.3 8.5   LFT  Recent Labs  01/22/14 1051 01/23/14 0637  PROT 6.7 6.0  ALBUMIN 2.4* 2.3*  AST 31 23  ALT 24 20  ALKPHOS 323* 286*  BILITOT 1.0 1.0    Studies/Results: Ct Abdomen Pelvis W Contrast  01/22/2014   CLINICAL DATA:  Generalized abdominal pain.  EXAM: CT ABDOMEN AND PELVIS WITH CONTRAST  TECHNIQUE: Multidetector CT imaging of the abdomen and pelvis was performed using the standard protocol following bolus administration of intravenous contrast.  CONTRAST:  43mL OMNIPAQUE IOHEXOL 300 MG/ML SOLN, 115mL OMNIPAQUE IOHEXOL 300 MG/ML SOLN   COMPARISON:  CT scan of November 19, 2013.  FINDINGS: Severe degenerative disc disease is noted at L4-5 and L5-S1. Mild bilateral pleural effusions are now noted.  Large amount of contrast is noted within the gallbladder. There has been interval placement of biliary stent through common bile duct into duodenum. Pneumobilia is noted in the left hepatic lobe. Spleen appears normal. The pancreatic head appears to be enlarged compared to prior exam, concerning for neoplasm or inflammation. Adrenal glands appear normal. Left kidney and ureter appear normal. Moderate right hydroureteronephrosis is noted without evidence of obstructing calculus. Distal right ureter appears to be normal in caliber, concerning for stricture. Atherosclerotic calcifications of abdominal aorta are noted without aneurysmal dilatation.  There is no evidence of bowel obstruction. There is the interval development of collateral veins in the in the anterior portion of the peritoneal space suggesting portal hypertension. This appears to be due to ill-defined soft tissue density in the porta hepatis measuring 3.1 x 2.9 cm consistent with adenopathy, which is compressing the main portal vein. Mildly enlarged lymph nodes are noted in the mesenteries with the largest measuring 13 x 11 mm in the right lower quadrant, which is increased in size compared to prior exam. Mild amount of free fluid is noted in the dependent portion of the pelvis. Stable cystic mass is noted in the left pelvis measuring 6.9 x 3.6 cm. Urinary bladder is decompressed. Mild anasarca is noted.  IMPRESSION: Interval development of moderate right hydroureteronephrosis without obstructing calculus; this most likely is due to stricture involving the distal right ureter.  Mild anasarca is noted.  Interval placement of biliary stent through common bile duct into duodenum. Pancreatic head appears to be enlarged compared to prior exam, concerning for inflammation or possibly neoplasm.  Mild  bilateral pleural effusions are noted.  Interval development of ill-defined soft tissue density in the porta hepatis region concerning for adenopathy or neoplasm, which appears to be compressing the main portal vein and resulting in portal hypertension and the development of collateral veins within the peritoneal space.  Mildly enlarged mesenteric adenopathy is again noted which appears to be slightly enlarged compared to prior exam and is concerning for metastatic disease.  Stable left adnexal complex cystic abnormality compared to prior exam.   Electronically Signed   By: Sabino Dick M.D.   On: 01/22/2014 14:15   Dg Abd Acute W/chest  01/22/2014   CLINICAL DATA:  Acute chest and abdominal pain. Shortness of breath.  EXAM: ACUTE ABDOMEN SERIES (ABDOMEN 2 VIEW & CHEST 1 VIEW)  COMPARISON:  Chest radiograph January 16, 2014.  FINDINGS: Biliary stent is noted. No abnormal bowel dilatation is noted. Moderate stool burden is noted. No pneumoperitoneum is noted. Stable cardiomediastinal silhouette. Sternotomy wires are noted. Right subclavian Port-A-Cath is again noted with distal tip in expected position of right innominate vein. Minimal right basilar opacity is noted most consistent with subsegmental atelectasis. Minimal bilateral pleural effusions are noted.  IMPRESSION: Minimal bilateral pleural effusions. Minimal right basilar subsegmental atelectasis.  No evidence of bowel obstruction or ileus.   Electronically Signed   By: Sabino Dick M.D.   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 Metallic metal stent placed. Treated empirically for cholangitis, with 5 additional days of oral antibiotics prescribed at most recent discharge (Augmentin) Presenting this admission with shortness of breath and abdominal pain. CT findings noted; stent  in good position, LFTs significantly improving, and lipase normal. No evidence for pancreatitis; concern remains for pancreatic neoplasm. Also notable ill-defined soft-tissue density in porta hepatis region concerning for neoplasm, causing compression on portal vein, which is likely culprit for her persistent abdominal pain.  Overall poor prognosis for patient; needs oncology consultation and nephrology consultation.   Nausea: will order scheduled Zofran with meals. Continue PPI BID.       Plan: Scheduled Zofran with meals PPI BID Oncology and Nephrology consults Supportive measures Diet as tolerated   Orvil Feil, ANP-BC [facility]    LOS: 1 day    01/23/2014, 9:17 AM    Addendum at 1230: oncology and Urology consultation planned. Nephrology inadvertently consulted. Urologist only available on Tuesdays and Fridays. If patient remains inpatient, urology will see on Friday.   Attending note:  Patient seen and examined. Discussed with Dr. Roderic Palau. Await oncology input.

## 2014-01-23 NOTE — Care Management Note (Signed)
    Page 1 of 1   01/25/2014     10:44:05 AM CARE MANAGEMENT NOTE 01/25/2014  Patient:  Erika Moreno, Erika Moreno   Account Number:  [id number]  Date Initiated:  01/23/2014  Documentation initiated by:  Theophilus Kinds  Subjective/Objective Assessment:   Pt admitted from home with SOB and abd pain. Pt lives alone and has a daughter that is currently staying with pt. Pt also has a strong family support system. Pt is independent with ADL's.     Action/Plan:   Will continue to follow for discharge planning needs. ? need for home O2 at discharge.   Anticipated DC Date:  01/26/2014   Anticipated DC Plan:  [place]  CM consult      Choice offered to / List presented to:             Status of service:  Completed, signed off Medicare Important Message given?   (If response is "NO", the following Medicare IM given date fields will be blank) Date Medicare IM given:   Medicare IM given by:   Date Additional Medicare IM given:   Additional Medicare IM given by:    Discharge Disposition:  ACUTE TO ACUTE TRANS  Per UR Regulation:    If discussed at Long Length of Stay Meetings, dates discussed:    Comments:  01/25/14 Fox, RN BSN CM Pt to transfer to [facility] for urological surgery and services. CM on receiving unit to follow for discharge planning needs.  01/23/14 [place], RN BSN CM

## 2014-01-23 NOTE — Progress Notes (Signed)
[facility] PROGRESS NOTE  Erika Moreno DQQ:[id number] DOB: Sep 05, 1945 DOA: 01/22/2014 PCP: [name], MD  Assessment/Plan: 1. Abdominal pain. Patient recently had stones in CBD and had CBD stent placed. This was reevaluated on repeat CT which showed that biliary stent was still in place. She was seen by gastroenterology did not feel that the stent needed to be removed/adjusted. Liver function tests appear to be improved from prior hospitalization. It is felt that her pain is possibly related to progression of her underlying cancer/mass that is compressing the porta hepatis. No further intervention was recommended by gastroenterology. Oncology is also seen the patient and treatment will be supportive at this point with antiemetics and pain management. Advance diet as tolerated. 2. Hydronephrosis of the right kidney. Possibly related to stricture. No obstructing stone was noted. Her renal function appears to be stable. She does not appear to have any flank/pelvic pain. I am doubtful that this is a significant finding in her current presentation and may likely be an incidental finding on CT imaging. Family wishes to discuss this further with urology. We have requested inpatient consultation, although this is likely not an urgent consult. Continue to follow renal function 3. Possible pneumonia. Patient started on Rocephin and azithromycin. Continue to follow 4. Anasarca volume overload. Possibly related to hypoalbuminemia and recent IV fluids during her previous and current hospitalization. We'll start the patient on intravenous Lasix. Ultrasound of her left upper extremity has been ordered to rule out underlying DVT 5. Anemia of chronic disease, possibly related to previous chemotherapy. Oncology has ordered 2 units of PRBCs. 6. 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 Appreciate oncology assistance. It appears that her malignancy has continued to progress despite receiving  treatment. Further options for continued IV chemotherapy versus hospice care was discussed with the patient. She wishes to see urology before making any further decisions. She would be an appropriate candidate for hospice. We'll need to discuss further goals of care/CODE STATUS with the patient. She was in the process of completing a living will today.  Code Status: Full code Family Communication: Discussed with the patient and family members at the bedside Disposition Plan: Pending hospital course   Consultants:  Gastroenterology  Oncology  Procedures:  Antibiotics:  Ceftriaxone 10/20  Azithromycin 10/20  HPI/Subjective: Feeling ongoing abdominal pain, nausea and vomiting. Has not had any significant by mouth intake, has no appetite. He has noticed that she is beginning to become edematous again. Feels somewhat short of breath and easier to breathe sitting up  Objective: Filed Vitals:   01/23/14 1305  BP: 117/91  Pulse: 73  Temp: 99.5 F (37.5 C)  Resp: 20    Intake/Output Summary (Last 24 hours) at 01/23/14 2001 Last data filed at 01/23/14 1730  Gross per 24 hour  Intake 1111.25 ml  Output    400 ml  Net 711.25 ml   Filed Weights   01/22/14 1019 01/22/14 1701  Weight: 61.236 kg (135 lb) 66 kg (145 lb 8.1 oz)    Exam:   General:  NAD  Cardiovascular: s1, s2, rrr  Respiratory: diminished breath sounds at bases  Abdomen: soft, tender in upper abdomen, bs+  Musculoskeletal: 2+ in LE bilaterally, LUE also appears edematous   Data Reviewed: Basic Metabolic Panel:  Recent Labs Lab 01/17/14 0448 01/22/14 1051 01/23/14 0637  NA 136* 141 138  K 3.5* 3.5* 3.7  CL 96 95* 95*  CO2 25 35* 31  GLUCOSE 127* 103* 102*  BUN 15 15 13  CREATININE 1.07 0.92 0.99  CALCIUM 8.8 9.3 8.5   Liver Function Tests:  Recent Labs Lab 01/17/14 0448 01/22/14 1051 01/23/14 0637  AST 30 31 23   ALT 51* 24 20  ALKPHOS 641* 323* 286*  BILITOT 1.9* 1.0 1.0  PROT 6.2  6.7 6.0  ALBUMIN 2.1* 2.4* 2.3*    Recent Labs Lab 01/22/14 1051  LIPASE 33   No results found for this basename: AMMONIA,  in the last 168 hours CBC:  Recent Labs Lab 01/22/14 1051 01/23/14 0637  WBC 5.2 4.6  NEUTROABS 3.8  --   HGB 8.6* 7.9*  HCT 27.7* 25.4*  MCV 93.9 93.4  PLT 242 269   Cardiac Enzymes: No results found for this basename: CKTOTAL, CKMB, CKMBINDEX, TROPONINI,  in the last 168 hours BNP (last 3 results)  Recent Labs  02/10/13 1655  PROBNP 193.2*   CBG: No results found for this basename: GLUCAP,  in the last 168 hours  Recent Results (from the past 240 hour(s))  MRSA PCR SCREENING     Status: Abnormal   Collection Time    01/15/14  4:43 PM      Result Value Ref Range Status   MRSA by PCR POSITIVE (*) NEGATIVE Final   Comment:            The GeneXpert MRSA Assay (FDA     approved for NASAL specimens     only), is one component of a     comprehensive MRSA colonization     surveillance program. It is not     intended to diagnose MRSA     infection nor to guide or     monitor treatment for     MRSA infections.     RESULT CALLED TO, READ BACK BY AND VERIFIED WITH:     SCHONEITZ,L ON 01/15/2014 AT 1830 BY ISLEY,B      Studies: Ct Abdomen Pelvis W Contrast  01/22/2014   CLINICAL DATA:  Generalized abdominal pain.  EXAM: CT ABDOMEN AND PELVIS WITH CONTRAST  TECHNIQUE: Multidetector CT imaging of the abdomen and pelvis was performed using the standard protocol following bolus administration of intravenous contrast.  CONTRAST:  48mL OMNIPAQUE IOHEXOL 300 MG/ML SOLN, 130mL OMNIPAQUE IOHEXOL 300 MG/ML SOLN  COMPARISON:  CT scan of November 19, 2013.  FINDINGS: Severe degenerative disc disease is noted at L4-5 and L5-S1. Mild bilateral pleural effusions are now noted.  Large amount of contrast is noted within the gallbladder. There has been interval placement of biliary stent through common bile duct into duodenum. Pneumobilia is noted in the left hepatic  lobe. Spleen appears normal. The pancreatic head appears to be enlarged compared to prior exam, concerning for neoplasm or inflammation. Adrenal glands appear normal. Left kidney and ureter appear normal. Moderate right hydroureteronephrosis is noted without evidence of obstructing calculus. Distal right ureter appears to be normal in caliber, concerning for stricture. Atherosclerotic calcifications of abdominal aorta are noted without aneurysmal dilatation.  There is no evidence of bowel obstruction. There is the interval development of collateral veins in the in the anterior portion of the peritoneal space suggesting portal hypertension. This appears to be due to ill-defined soft tissue density in the porta hepatis measuring 3.1 x 2.9 cm consistent with adenopathy, which is compressing the main portal vein. Mildly enlarged lymph nodes are noted in the mesenteries with the largest measuring 13 x 11 mm in the right lower quadrant, which is increased in size compared to prior exam. Mild amount of  free fluid is noted in the dependent portion of the pelvis. Stable cystic mass is noted in the left pelvis measuring 6.9 x 3.6 cm. Urinary bladder is decompressed. Mild anasarca is noted.  IMPRESSION: Interval development of moderate right hydroureteronephrosis without obstructing calculus; this most likely is due to stricture involving the distal right ureter.  Mild anasarca is noted.  Interval placement of biliary stent through common bile duct into duodenum. Pancreatic head appears to be enlarged compared to prior exam, concerning for inflammation or possibly neoplasm.  Mild bilateral pleural effusions are noted.  Interval development of ill-defined soft tissue density in the porta hepatis region concerning for adenopathy or neoplasm, which appears to be compressing the main portal vein and resulting in portal hypertension and the development of collateral veins within the peritoneal space.  Mildly enlarged mesenteric  adenopathy is again noted which appears to be slightly enlarged compared to prior exam and is concerning for metastatic disease.  Stable left adnexal complex cystic abnormality compared to prior exam.   Electronically Signed   By: Sabino Dick M.D.   On: 01/22/2014 14:15   US Venous Img Upper Uni Left  01/23/2014   CLINICAL DATA:  Left upper extremity edema. Recent peripheral IV placement.  EXAM: LEFT UPPER EXTREMITY VENOUS DOPPLER ULTRASOUND  TECHNIQUE: Gray-scale sonography with graded compression, as well as color Doppler and duplex ultrasound were performed to evaluate the upper extremity deep venous system from the level of the subclavian vein and including the jugular, axillary, basilic, radial, ulnar and upper cephalic vein. Spectral Doppler was utilized to evaluate flow at rest and with distal augmentation maneuvers.  COMPARISON:  None.  FINDINGS: Internal Jugular Vein: No evidence of thrombus. Normal compressibility, respiratory phasicity and response to augmentation.  Subclavian Vein: No evidence of thrombus. Normal compressibility, respiratory phasicity and response to augmentation.  Axillary Vein: No evidence of thrombus. Normal compressibility, respiratory phasicity and response to augmentation.  Cephalic Vein: No evidence of thrombus. Normal compressibility, respiratory phasicity and response to augmentation.  Basilic Vein: No evidence of thrombus. Normal compressibility, respiratory phasicity and response to augmentation.  Brachial Veins: No evidence of thrombus. Normal compressibility, respiratory phasicity and response to augmentation.  Radial Veins: No evidence of thrombus. Normal compressibility, respiratory phasicity and response to augmentation.  Ulnar Veins: No evidence of thrombus. Normal compressibility, respiratory phasicity and response to augmentation.  Venous Reflux:  None visualized.  Other Findings: No evidence of superficial thrombophlebitis or abnormal fluid collection.   IMPRESSION: No evidence of left upper extremity deep venous thrombosis or superficial thrombophlebitis.   Electronically Signed   By: Aletta Edouard M.D.   On: 01/23/2014 17:22   Dg Abd Acute W/chest  01/22/2014   CLINICAL DATA:  Acute chest and abdominal pain. Shortness of breath.  EXAM: ACUTE ABDOMEN SERIES (ABDOMEN 2 VIEW & CHEST 1 VIEW)  COMPARISON:  Chest radiograph January 16, 2014.  FINDINGS: Biliary stent is noted. No abnormal bowel dilatation is noted. Moderate stool burden is noted. No pneumoperitoneum is noted. Stable cardiomediastinal silhouette. Sternotomy wires are noted. Right subclavian Port-A-Cath is again noted with distal tip in expected position of right innominate vein. Minimal right basilar opacity is noted most consistent with subsegmental atelectasis. Minimal bilateral pleural effusions are noted.  IMPRESSION: Minimal bilateral pleural effusions. Minimal right basilar subsegmental atelectasis.  No evidence of bowel obstruction or ileus.   Electronically Signed   By: Sabino Dick M.D.   On: 01/22/2014 12:29    Scheduled Meds: . sodium chloride  250 mL Intravenous Once  . acetaminophen  650 mg Oral Once  . aspirin EC  81 mg Oral Daily  . azithromycin  500 mg Intravenous Q24H  . cefTRIAXone (ROCEPHIN)  IV  1 g Intravenous Q24H  . diphenhydrAMINE  25 mg Oral Once  . DULoxetine  30 mg Oral Daily  . enoxaparin (LOVENOX) injection  40 mg Subcutaneous Q24H  . furosemide  20 mg Intravenous BID  . isosorbide mononitrate  60 mg Oral Daily  . ondansetron  4 mg Oral TID AC, HS, 0200  . pantoprazole  40 mg Oral BID AC  . predniSONE  5 mg Oral Q breakfast   Continuous Infusions:   Active Problems:   Cancer of unknown origin   Cholangitis   Dilated cbd, acquired   Hydronephrosis of right kidney   Hydronephrosis    Time spent: 46mins    MEMON,JEHANZEB  [facility] Pager [phone]. If 7PM-7AM, please contact night-coverage at [email or web address], password  [facility] 01/23/2014, 8:01 PM  LOS: 1 day

## 2014-01-24 ENCOUNTER — Inpatient Hospital Stay (HOSPITAL_COMMUNITY): Payer: PRIVATE HEALTH INSURANCE

## 2014-01-24 ENCOUNTER — Other Ambulatory Visit (HOSPITAL_COMMUNITY): Payer: Self-pay | Admitting: Oncology

## 2014-01-24 ENCOUNTER — Inpatient Hospital Stay (HOSPITAL_BASED_OUTPATIENT_CLINIC_OR_DEPARTMENT_OTHER): Payer: PRIVATE HEALTH INSURANCE

## 2014-01-24 VITALS — BP 166/93 | HR 77 | Temp 98.0°F | Resp 20 | Wt 147.2 lb

## 2014-01-24 DIAGNOSIS — T451X5A Adverse effect of antineoplastic and immunosuppressive drugs, initial encounter: Secondary | ICD-10-CM

## 2014-01-24 DIAGNOSIS — C778 Secondary and unspecified malignant neoplasm of lymph nodes of multiple regions: Secondary | ICD-10-CM

## 2014-01-24 DIAGNOSIS — C801 Malignant (primary) neoplasm, unspecified: Secondary | ICD-10-CM

## 2014-01-24 DIAGNOSIS — Z5111 Encounter for antineoplastic chemotherapy: Secondary | ICD-10-CM

## 2014-01-24 DIAGNOSIS — R112 Nausea with vomiting, unspecified: Secondary | ICD-10-CM

## 2014-01-24 DIAGNOSIS — C482 Malignant neoplasm of peritoneum, unspecified: Secondary | ICD-10-CM

## 2014-01-24 LAB — COMPREHENSIVE METABOLIC PANEL
ALK PHOS: 277 U/L — AB (ref 39–117)
ALT: 18 U/L (ref 0–35)
ANION GAP: 11 (ref 5–15)
AST: 22 U/L (ref 0–37)
Albumin: 2.5 g/dL — ABNORMAL LOW (ref 3.5–5.2)
BILIRUBIN TOTAL: 1.2 mg/dL (ref 0.3–1.2)
BUN: 14 mg/dL (ref 6–23)
CHLORIDE: 96 meq/L (ref 96–112)
CO2: 30 meq/L (ref 19–32)
Calcium: 9.1 mg/dL (ref 8.4–10.5)
Creatinine, Ser: 1.07 mg/dL (ref 0.50–1.10)
GFR calc non Af Amer: 52 mL/min — ABNORMAL LOW (ref >90)
GFR, EST AFRICAN AMERICAN: 60 mL/min — AB (ref >90)
GLUCOSE: 164 mg/dL — AB (ref 70–99)
POTASSIUM: 3.8 meq/L (ref 3.7–5.3)
Sodium: 137 mEq/L (ref 137–147)
Total Protein: 6.7 g/dL (ref 6.0–8.3)

## 2014-01-24 LAB — CBC WITH DIFFERENTIAL/PLATELET
Basophils Absolute: 0 10*3/uL (ref 0.0–0.1)
Basophils Relative: 0 % (ref 0–1)
Eosinophils Absolute: 0 10*3/uL (ref 0.0–0.7)
Eosinophils Relative: 1 % (ref 0–5)
HCT: 33.8 % — ABNORMAL LOW (ref 36.0–46.0)
HEMOGLOBIN: 11 g/dL — AB (ref 12.0–15.0)
LYMPHS ABS: 0.6 10*3/uL — AB (ref 0.7–4.0)
LYMPHS PCT: 12 % (ref 12–46)
MCH: 29.6 pg (ref 26.0–34.0)
MCHC: 32.5 g/dL (ref 30.0–36.0)
MCV: 90.9 fL (ref 78.0–100.0)
MONOS PCT: 10 % (ref 3–12)
Monocytes Absolute: 0.5 10*3/uL (ref 0.1–1.0)
NEUTROS ABS: 3.9 10*3/uL (ref 1.7–7.7)
NEUTROS PCT: 77 % (ref 43–77)
Platelets: 249 10*3/uL (ref 150–400)
RBC: 3.72 MIL/uL — AB (ref 3.87–5.11)
RDW: 17.6 % — ABNORMAL HIGH (ref 11.5–15.5)
WBC: 5.1 10*3/uL (ref 4.0–10.5)

## 2014-01-24 MED ORDER — SODIUM CHLORIDE 0.9 % IV SOLN
Freq: Once | INTRAVENOUS | Status: AC
  Administered 2014-01-24: 10:00:00 via INTRAVENOUS

## 2014-01-24 MED ORDER — SODIUM CHLORIDE 0.9 % IJ SOLN
10.0000 mL | INTRAMUSCULAR | Status: DC | PRN
  Administered 2014-01-24: 10 mL

## 2014-01-24 MED ORDER — TOPOTECAN HCL CHEMO INJECTION 4 MG
2.0000 mg/m2 | Freq: Once | INTRAVENOUS | Status: AC
  Administered 2014-01-24: 3.4 mg via INTRAVENOUS
  Filled 2014-01-24: qty 3.4

## 2014-01-24 MED ORDER — SODIUM CHLORIDE 0.9 % IV SOLN: 8.0000 mg | Freq: Once | INTRAVENOUS | Status: DC

## 2014-01-24 MED ORDER — ONDANSETRON HCL 40 MG/20ML IJ SOLN
Freq: Once | INTRAMUSCULAR | Status: AC
  Administered 2014-01-24: 8 mg via INTRAVENOUS
  Filled 2014-01-24: qty 4

## 2014-01-24 MED ORDER — ALPRAZOLAM 0.25 MG PO TABS
0.2500 mg | ORAL_TABLET | ORAL | Status: DC | PRN
  Administered 2014-01-24 – 2014-01-27 (×12): 0.25 mg via ORAL
  Filled 2014-01-24 (×12): qty 1

## 2014-01-24 MED ORDER — DEXAMETHASONE SODIUM PHOSPHATE 10 MG/ML IJ SOLN: 10.0000 mg | Freq: Once | INTRAMUSCULAR | Status: DC

## 2014-01-24 MED ORDER — FUROSEMIDE 10 MG/ML IJ SOLN
40.0000 mg | Freq: Two times a day (BID) | INTRAMUSCULAR | Status: DC
  Filled 2014-01-24: qty 4

## 2014-01-24 MED ORDER — AZITHROMYCIN 250 MG PO TABS
500.0000 mg | ORAL_TABLET | Freq: Every day | ORAL | Status: DC
  Administered 2014-01-24: 500 mg via ORAL
  Filled 2014-01-24: qty 2

## 2014-01-24 MED ORDER — HEPARIN SOD (PORK) LOCK FLUSH 100 UNIT/ML IV SOLN
500.0000 [IU] | Freq: Once | INTRAVENOUS | Status: AC
  Administered 2014-01-24: 500 [IU] via INTRAVENOUS
  Filled 2014-01-24: qty 5

## 2014-01-24 NOTE — Progress Notes (Signed)
I called back to the floor around 1:30 and spoke with Margreta Journey, pt's nurse, and told her to make sure and put a chemo hazard sign up on the door. She said ok.

## 2014-01-24 NOTE — Patient Instructions (Addendum)
[place]   CHEMOTHERAPY INSTRUCTIONS  Premeds: Zofran (for nausea prevention/reduction) & Dexamethasone (decreases nausea, decreases risk of having an allergic reaction to chemo, and generally makes you feel better)  Topotecan - bone marrow suppression (lowering of WBCs, RBCs, and platelets), diarrhea, hair loss, nausea/vomiting, headache. (Takes 30 minutes to infuse) (Days 1, 8, 15 every 28 days -- this means you will come 3 weeks in a row and then have 1 week off and start all over again)   EDUCATIONAL MATERIALS GIVEN AND REVIEWED: Specific Instructions Sheets: Topotecan, Dexamethasone, Zofran, Imodium   SELF CARE ACTIVITIES WHILE ON CHEMOTHERAPY: Increase your fluid intake 48 hours prior to treatment and drink at least 2 quarts per day after treatment., No alcohol intake., No aspirin or other medications unless approved by your oncologist., Eat foods that are light and easy to digest., Eat foods at cold or room temperature., No fried, fatty, or spicy foods immediately before or after treatment., Have teeth cleaned professionally before starting treatment. Keep dentures and partial plates clean., Use soft toothbrush and do not use mouthwashes that contain alcohol. Biotene is a good mouthwash that is available at most pharmacies or may be ordered by calling [phone]., Use warm salt water gargles (1 teaspoon salt per 1 quart warm water) before and after meals and at bedtime. Or you may rinse with 2 tablespoons of three -percent hydrogen peroxide mixed in eight ounces of water., Always use sunscreen with SPF (Sun Protection Factor) of 30 or higher., Use your nausea medication as directed to prevent nausea., Use your stool softener or laxative as directed to prevent constipation. and Use your anti-diarrheal medication as directed to stop diarrhea.  Please wash your hands for at least 30 seconds using warm soapy water. Handwashing is the #1 way to prevent the  spread of germs. Stay away from sick people or people who are getting over a cold. If you develop respiratory systems such as green/yellow mucus production or productive cough or persistent cough let us know and we will see if you need an antibiotic. It is a good idea to keep a pair of gloves on when going into grocery stores/[employer] to decrease your risk of coming into contact with germs on the carts, etc. Carry alcohol hand gel with you at all times and use it frequently if out in public. All foods need to be cooked thoroughly. No raw foods. No medium or undercooked meats, eggs. If your food is cooked medium well, it does not need to be hot pink or saturated with bloody liquid at all. Vegetables and fruits need to be washed/rinsed under the faucet with a dish detergent before being consumed. You can eat raw fruits and vegetables unless we tell you otherwise but it would be best if you cooked them or bought frozen. Do not eat off of salad bars or hot bars unless you really trust the cleanliness of the restaurant. If you need dental work, please let us know before you go for your appointment so that we can coordinate the best possible time for you in regards to your chemo regimen. You need to also let your dentist know that you are actively taking chemo. We may need to do labs prior to your dental appointment. We also want your bowels moving at least every other day. If this is not happening, we need to know so that we can get you on a bowel regimen to help you go.  MEDICATIONS: You have been given prescriptions for the following medications:  Over-the-Counter Meds:  Colace - this is a stool softener. Take 100mg  capsule 2-6 times a day as needed. If you have to take more than 6 capsules of Colace a day call the [facility].  Senna - this is a mild laxative used to treat mild constipation. May take 2 tabs by mouth daily or up to twice a day as needed for mild constipation.  Milk of Magnesia -  this is a laxative used to treat moderate to severe constipation. May take 2-4 tablespoons every 8 hours as needed. May increase to 8 tablespoons x 1 dose and if no bowel movement call the [facility].  Imodium - this is for diarrhea. Take 2 tabs after 1st loose stool and then 1 tab very 2 hours until you go a total of 12 hours without having a loose stool. If it is night time please take 2 tablets every 4 hours and call the [facility] in am. If loose stools continue - always contact [facility] if it is weekday and go to Emergency Department if it is weekend.    SYMPTOMS TO REPORT AS SOON AS POSSIBLE AFTER TREATMENT:  FEVER GREATER THAN 100.5 F  CHILLS WITH OR WITHOUT FEVER  NAUSEA AND VOMITING THAT IS NOT CONTROLLED WITH YOUR NAUSEA MEDICATION  UNUSUAL SHORTNESS OF BREATH  UNUSUAL BRUISING OR BLEEDING  TENDERNESS IN MOUTH AND THROAT WITH OR WITHOUT PRESENCE OF ULCERS  URINARY PROBLEMS  BOWEL PROBLEMS  UNUSUAL RASH    Wear comfortable clothing and clothing appropriate for easy access to any Portacath or PICC line. Let us know if there is anything that we can do to make your therapy better!      I have been informed and understand all of the instructions given to me and have received a copy. I have been instructed to call the clinic [phone] or my family physician as soon as possible for continued medical care, if indicated. I do not have any more questions at this time but understand that I may call the [facility] or the Patient Navigator at [phone] during office hours should I have questions or need assistance in obtaining follow-up care.           Topotecan injection What is this medicine? TOPOTECAN (TOE poe TEE kan) is a chemotherapy drug. It is used to treat lung cancer, ovarian cancer, and cervical cancer. This medicine may be used for other purposes; ask your health care provider or pharmacist if you have questions. COMMON BRAND NAME(S):  Hycamtin What should I tell my health care provider before I take this medicine? They need to know if you have any of these conditions: -blood disorders -dehydration -diarrhea -immune system problems -infection (especially a virus infection such as chickenpox, cold sores, or herpes) -kidney disease -low blood counts, like low white cell, platelet, or red cell counts -recent or ongoing radiation therapy -an unusual or allergic reaction to topotecan, other medicines, foods, dyes, or preservatives -pregnant or trying to get pregnant -breast-feeding How should I use this medicine? This medicine is for infusion into a vein. It is usually given by a health care professional in a hospital or clinic setting. In rare cases, you might get this medicine at home. You will be taught how to give this medicine. Use exactly as directed. Take your medicine at regular intervals. Do not take your medicine more often than directed. It is important that you put  your used needles and syringes in a special sharps container. Do not put them in a trash can. If you do not have a sharps container, call your pharmacist or healthcare provider to get one. Talk to your pediatrician regarding the use of this medicine in children. Special care may be needed. Overdosage: If you think you have taken too much of this medicine contact a poison control center or emergency room at once. NOTE: This medicine is only for you. Do not share this medicine with others. What if I miss a dose? It is important not to miss your dose. Call your doctor or health care professional if you are unable to keep an appointment. What may interact with this medicine? -amiodarone -antiviral medicines for HIV or AIDS -cisplatin -clarithromycin -cyclosporine -diltiazem -erythromycin -grapefruit or grapefruit juice -medicines for fungal infections like ketoconazole and itraconazole -mefloquine -mifepristone,  RU-486 -nicardipine -phenytoin -propafenone -quinidine -tacrolimus -tamoxifen -testosterone -vaccines -verapamil Talk to your prescriber or health care professional before taking any of these medicines: -aspirin -acetaminophen -ibuprofen -naproxen -ketoprofen This list may not describe all possible interactions. Give your health care provider a list of all the medicines, herbs, non-prescription drugs, or dietary supplements you use. Also tell them if you smoke, drink alcohol, or use illegal drugs. Some items may interact with your medicine. What should I watch for while using this medicine? This drug may make you feel generally unwell. This is not uncommon, as chemotherapy can affect healthy cells as well as cancer cells. Report any side effects. Continue your course of treatment even though you feel ill unless your doctor tells you to stop. Call your doctor or health care professional for advice if you get a fever, chills or sore throat, or other symptoms of a cold or flu. Do not treat yourself. This drug decreases your body's ability to fight infections. Try to avoid being around people who are sick. This medicine may increase your risk to bruise or bleed. Call your doctor or health care professional if you notice any unusual bleeding. Be careful brushing and flossing your teeth or using a toothpick because you may get an infection or bleed more easily. If you have any dental work done, tell your dentist you are receiving this medicine. Avoid taking products that contain aspirin, acetaminophen, ibuprofen, naproxen, or ketoprofen unless instructed by your doctor. These medicines may hide a fever. Do not become pregnant while taking this medicine. Women should inform their doctor if they wish to become pregnant or think they might be pregnant. There is a potential for serious side effects to an unborn child. Talk to your health care professional or pharmacist for more information. Do not  breast-feed an infant while taking this medicine. What side effects may I notice from receiving this medicine? Side effects that you should report to your doctor or health care professional as soon as possible: -allergic reactions like skin rash, itching or hives, swelling of the face, lips, or tongue -breathing difficulties -diarrhea -dizziness -fever or chills, sore throat -mouth sores or pain -pain, tingling, numbness in the hands or feet -unusual bleeding or bruising -unusually weak or tired -yellowing of the eyes or skin Side effects that usually do not require medical attention (report to your doctor or health care professional if they continue or are bothersome): -hair loss -headache -loss of appetite -nausea, vomiting -stomach pain This list may not describe all possible side effects. Call your doctor for medical advice about side effects. You may report side effects to FDA  at 1-800-FDA-1088. Where should I keep my medicine? Keep out of the reach of children. This drug is usually given in a hospital or clinic and will not be stored at home. In rare cases, this medicine may be given at home. If you are using this medicine at home, you will be instructed on how to store this medicine. Throw away any unused medicine after the expiration date on the label. NOTE: This sheet is a summary. It may not cover all possible information. If you have questions about this medicine, talk to your doctor, pharmacist, or health care provider.  2015, Elsevier/Gold Standard. (2007-12-06 17:25:53) Ondansetron injection What is this medicine? ONDANSETRON (on DAN se tron) is used to treat nausea and vomiting caused by chemotherapy. It is also used to prevent or treat nausea and vomiting after surgery. This medicine may be used for other purposes; ask your health care provider or pharmacist if you have questions. COMMON BRAND NAME(S): Zofran What should I tell my health care provider before I take  this medicine? They need to know if you have any of these conditions: -heart disease -history of irregular heartbeat -liver disease -low levels of magnesium or potassium in the blood -an unusual or allergic reaction to ondansetron, granisetron, other medicines, foods, dyes, or preservatives -pregnant or trying to get pregnant -breast-feeding How should I use this medicine? This medicine is for infusion into a vein. It is given by a health care professional in a hospital or clinic setting. Talk to your pediatrician regarding the use of this medicine in children. Special care may be needed. Overdosage: If you think you have taken too much of this medicine contact a poison control center or emergency room at once. NOTE: This medicine is only for you. Do not share this medicine with others. What if I miss a dose? This does not apply. What may interact with this medicine? Do not take this medicine with any of the following medications: -apomorphine -certain medicines for fungal infections like fluconazole, itraconazole, ketoconazole, posaconazole, voriconazole -cisapride -dofetilide -dronedarone -pimozide -thioridazine -ziprasidone This medicine may also interact with the following medications: -carbamazepine -certain medicines for depression, anxiety, or psychotic disturbances -fentanyl -linezolid -MAOIs like Carbex, Eldepryl, Marplan, Nardil, and Parnate -methylene blue (injected into a vein) -other medicines that prolong the QT interval (cause an abnormal heart rhythm) -phenytoin -rifampicin -tramadol This list may not describe all possible interactions. Give your health care provider a list of all the medicines, herbs, non-prescription drugs, or dietary supplements you use. Also tell them if you smoke, drink alcohol, or use illegal drugs. Some items may interact with your medicine. What should I watch for while using this medicine? Your condition will be monitored carefully  while you are receiving this medicine. What side effects may I notice from receiving this medicine? Side effects that you should report to your doctor or health care professional as soon as possible: -allergic reactions like skin rash, itching or hives, swelling of the face, lips, or tongue -breathing problems -confusion -dizziness -fast or irregular heartbeat -feeling faint or lightheaded, falls -fever and chills -loss of balance or coordination -seizures -sweating -swelling of the hands and feet -tightness in the chest -tremors -unusually weak or tired Side effects that usually do not require medical attention (report to your doctor or health care professional if they continue or are bothersome): -constipation or diarrhea -headache This list may not describe all possible side effects. Call your doctor for medical advice about side effects. You may report side effects  to FDA at 1-800-FDA-1088. Where should I keep my medicine? This drug is given in a hospital or clinic and will not be stored at home. NOTE: This sheet is a summary. It may not cover all possible information. If you have questions about this medicine, talk to your doctor, pharmacist, or health care provider.  2015, Elsevier/Gold Standard. (2012-12-27 16:18:28) Dexamethasone injection What is this medicine? DEXAMETHASONE (dex a METH a sone) is a corticosteroid. It is used to treat inflammation of the skin, joints, lungs, and other organs. Common conditions treated include asthma, allergies, and arthritis. It is also used for other conditions, like blood disorders and diseases of the adrenal glands. This medicine may be used for other purposes; ask your health care provider or pharmacist if you have questions. COMMON BRAND NAME(S): Decadron, Solurex What should I tell my health care provider before I take this medicine? They need to know if you have any of these conditions: -blood clotting problems -Cushing's  syndrome -diabetes -glaucoma -heart problems or disease -high blood pressure -infection like herpes, measles, tuberculosis, or chickenpox -kidney disease -liver disease -mental problems -myasthenia gravis -osteoporosis -previous heart attack -seizures -stomach, ulcer or intestine disease including colitis and diverticulitis -thyroid problem -an unusual or allergic reaction to dexamethasone, corticosteroids, other medicines, lactose, foods, dyes, or preservatives -pregnant or trying to get pregnant -breast-feeding How should I use this medicine? This medicine is for injection into a muscle, joint, lesion, soft tissue, or vein. It is given by a health care professional in a hospital or clinic setting. Talk to your pediatrician regarding the use of this medicine in children. Special care may be needed. Overdosage: If you think you have taken too much of this medicine contact a poison control center or emergency room at once. NOTE: This medicine is only for you. Do not share this medicine with others. What if I miss a dose? This may not apply. If you are having a series of injections over a prolonged period, try not to miss an appointment. Call your doctor or health care professional to reschedule if you are unable to keep an appointment. What may interact with this medicine? Do not take this medicine with any of the following medications: -mifepristone, RU-486 -vaccines This medicine may also interact with the following medications: -amphotericin B -antibiotics like clarithromycin, erythromycin, and troleandomycin -aspirin and aspirin-like drugs -barbiturates like phenobarbital -carbamazepine -cholestyramine -cholinesterase inhibitors like donepezil, galantamine, rivastigmine, and tacrine -cyclosporine -digoxin -diuretics -ephedrine -female hormones, like estrogens or progestins and birth control pills -indinavir -isoniazid -ketoconazole -medicines for diabetes -medicines  that improve muscle tone or strength for conditions like myasthenia gravis -NSAIDs, medicines for pain and inflammation, like ibuprofen or naproxen -phenytoin -rifampin -thalidomide -warfarin This list may not describe all possible interactions. Give your health care provider a list of all the medicines, herbs, non-prescription drugs, or dietary supplements you use. Also tell them if you smoke, drink alcohol, or use illegal drugs. Some items may interact with your medicine. What should I watch for while using this medicine? Your condition will be monitored carefully while you are receiving this medicine. If you are taking this medicine for a long time, carry an identification card with your name and address, the type and dose of your medicine, and your doctor's name and address. This medicine may increase your risk of getting an infection. Stay away from people who are sick. Tell your doctor or health care professional if you are around anyone with measles or chickenpox. Talk to your health care  provider before you get any vaccines that you take this medicine. If you are going to have surgery, tell your doctor or health care professional that you have taken this medicine within the last twelve months. Ask your doctor or health care professional about your diet. You may need to lower the amount of salt you eat. The medicine can increase your blood sugar. If you are a diabetic check with your doctor if you need help adjusting the dose of your diabetic medicine. What side effects may I notice from receiving this medicine? Side effects that you should report to your doctor or health care professional as soon as possible: -allergic reactions like skin rash, itching or hives, swelling of the face, lips, or tongue -black or tarry stools -change in the amount of urine -changes in vision -confusion, excitement, restlessness, a false sense of well-being -fever, sore throat, sneezing, cough, or other signs  of infection, wounds that will not heal -hallucinations -increased thirst -mental depression, mood swings, mistaken feelings of self importance or of being mistreated -pain in hips, back, ribs, arms, shoulders, or legs -pain, redness, or irritation at the injection site -redness, blistering, peeling or loosening of the skin, including inside the mouth -rounding out of face -swelling of feet or lower legs -unusual bleeding or bruising -unusual tired or weak -wounds that do not heal Side effects that usually do not require medical attention (report to your doctor or health care professional if they continue or are bothersome): -diarrhea or constipation -change in taste -headache -nausea, vomiting -skin problems, acne, thin and shiny skin -touble sleeping -unusual growth of hair on the face or body -weight gain This list may not describe all possible side effects. Call your doctor for medical advice about side effects. You may report side effects to FDA at 1-800-FDA-1088. Where should I keep my medicine? This drug is given in a hospital or clinic and will not be stored at home. NOTE: This sheet is a summary. It may not cover all possible information. If you have questions about this medicine, talk to your doctor, pharmacist, or health care provider.  2015, Elsevier/Gold Standard. (2007-07-13 14:04:12) Loperamide tablets or capsules What is this medicine? LOPERAMIDE (loe PER a mide) is used to treat diarrhea. This medicine may be used for other purposes; ask your health care provider or pharmacist if you have questions. COMMON BRAND NAME(S): Anti-Diarrheal, Imodium A-D, K-Pek II What should I tell my health care provider before I take this medicine? They need to know if you have any of these conditions: -a black or bloody stool -bacterial food poisoning -colitis or mucus in your stool -currently taking an antibiotic medication for an infection -fever -liver disease -severe  abdominal pain, swelling or bulging -an unusual or allergic reaction to loperamide, other medicines, foods, dyes, or preservatives -pregnant or trying to get pregnant -breast-feeding How should I use this medicine? Take this medicine by mouth with a glass of water. Follow the directions on the prescription label. Take your doses at regular intervals. Do not take your medicine more often than directed. Talk to your pediatrician regarding the use of this medicine in children. Special care may be needed. Overdosage: If you think you have taken too much of this medicine contact a poison control center or emergency room at once. NOTE: This medicine is only for you. Do not share this medicine with others. What if I miss a dose? This does not apply. This medicine is not for regular use. Only take this medicine  while you continue to have loose bowel movements. Do not take more medicine than recommended by the packaging label or by your healthcare professional. What may interact with this medicine? Do not take this medicine with any of the following medications: -alosetron This medicine may also interact with the following medications: -quinidine -ritonavir -saquinavir This list may not describe all possible interactions. Give your health care provider a list of all the medicines, herbs, non-prescription drugs, or dietary supplements you use. Also tell them if you smoke, drink alcohol, or use illegal drugs. Some items may interact with your medicine. What should I watch for while using this medicine? Do not take this medicine for more than 1 week without asking your doctor or health care professional. If your symptoms do not start to get better after two days, you may have a problem that needs further evaluation. Check with your doctor or health care professional right away if you develop a fever, severe abdominal pain, swelling or bulging, or if you have have bloody/black diarrhea or stools. You may get  drowsy or dizzy. Do not drive, use machinery, or do anything that needs mental alertness until you know how this medicine affects you. Do not stand or sit up quickly, especially if you are an older patient. This reduces the risk of dizzy or fainting spells. Alcohol can increase possible drowsiness and dizziness. Avoid alcoholic drinks. Your mouth may get dry. Chewing sugarless gum or sucking hard candy, and drinking plenty of water may help. Contact your doctor if the problem does not go away or is severe. Drinking plenty of water can also help prevent dehydration that can occur with diarrhea. Elderly patients may have a more variable response to the effects of this medicine, and are more susceptible to the effects of dehydration. What side effects may I notice from receiving this medicine? Side effects that you should report to your doctor or health care professional as soon as possible: -allergic reactions like skin rash, itching or hives, swelling of the face, lips, or tongue -bloated, swollen feeling in your abdomen -blurred vision -loss of appetite -stomach pain Side effects that usually do not require medical attention (report to your doctor or health care professional if they continue or are bothersome): -constipation -drowsiness or dizziness -dry mouth -nausea, vomiting This list may not describe all possible side effects. Call your doctor for medical advice about side effects. You may report side effects to FDA at 1-800-FDA-1088. Where should I keep my medicine? Keep out of the reach of children. Store at room temperature between 15 and 25 degrees C (59 and 77 degrees F). Keep container tightly closed. Throw away any unused medicine after the expiration date. NOTE: This sheet is a summary. It may not cover all possible information. If you have questions about this medicine, talk to your doctor, pharmacist, or health care provider.  2015, Elsevier/Gold Standard. (2007-09-26 16:02:13)

## 2014-01-24 NOTE — Progress Notes (Signed)
Tolerated chemotherapy well. 

## 2014-01-24 NOTE — Progress Notes (Addendum)
Chemo teaching done and consent signed for Topotecan. Imogene Burn RN had patient sign the consent for Topotecan and I did the teaching. Chemo appt list given to patient.

## 2014-01-24 NOTE — Progress Notes (Signed)
Subjective: Patient seen in room with family at the bedside.  She is in her recliner.  She reports that she is still interested in further treatment for her Stage IV Metastatic Ovarian Ca.  She is not yet prepared for Hospice and/or transition of care to comfort measures only.  With this information, and review of her labs, I have offered her inpatient chemotherapy with Topotecan single-agent.  Risks, benefits, alternatives and side effects.  She is agreeable with this plan and we will arrange for [facility] staff to accommodate this intervention today.  Objective: Vital signs in last 24 hours: Temp:  [98.1 F (36.7 C)-99.5 F (37.5 C)] 98.3 F (36.8 C) (10/22 0504) Pulse Rate:  [63-76] 64 (10/22 0504) Resp:  [18-20] 18 (10/22 0504) BP: (117-160)/(64-91) 147/84 mmHg (10/22 0504) SpO2:  [91 %-100 %] 91 % (10/22 0504)  Intake/Output from previous day: 10/21 0800 - 10/22 0759 In: 910 [P.O.:240; Blood:670] Out: 400 [Urine:400] Intake/Output this shift:    General appearance: alert, cooperative, appears stated age and no distress  Lab Results:   Recent Labs  01/22/14 1051 01/23/14 0637  WBC 5.2 4.6  HGB 8.6* 7.9*  HCT 27.7* 25.4*  PLT 242 269   BMET  Recent Labs  01/22/14 1051 01/23/14 0637  NA 141 138  K 3.5* 3.7  CL 95* 95*  CO2 35* 31  GLUCOSE 103* 102*  BUN 15 13  CREATININE 0.92 0.99  CALCIUM 9.3 8.5    Studies/Results: Ct Abdomen Pelvis W Contrast  01/22/2014   CLINICAL DATA:  Generalized abdominal pain.  EXAM: CT ABDOMEN AND PELVIS WITH CONTRAST  TECHNIQUE: Multidetector CT imaging of the abdomen and pelvis was performed using the standard protocol following bolus administration of intravenous contrast.  CONTRAST:  47mL OMNIPAQUE IOHEXOL 300 MG/ML SOLN, 129mL OMNIPAQUE IOHEXOL 300 MG/ML SOLN  COMPARISON:  CT scan of November 19, 2013.  FINDINGS: Severe degenerative disc disease is noted at L4-5 and L5-S1. Mild bilateral pleural effusions are now  noted.  Large amount of contrast is noted within the gallbladder. There has been interval placement of biliary stent through common bile duct into duodenum. Pneumobilia is noted in the left hepatic lobe. Spleen appears normal. The pancreatic head appears to be enlarged compared to prior exam, concerning for neoplasm or inflammation. Adrenal glands appear normal. Left kidney and ureter appear normal. Moderate right hydroureteronephrosis is noted without evidence of obstructing calculus. Distal right ureter appears to be normal in caliber, concerning for stricture. Atherosclerotic calcifications of abdominal aorta are noted without aneurysmal dilatation.  There is no evidence of bowel obstruction. There is the interval development of collateral veins in the in the anterior portion of the peritoneal space suggesting portal hypertension. This appears to be due to ill-defined soft tissue density in the porta hepatis measuring 3.1 x 2.9 cm consistent with adenopathy, which is compressing the main portal vein. Mildly enlarged lymph nodes are noted in the mesenteries with the largest measuring 13 x 11 mm in the right lower quadrant, which is increased in size compared to prior exam. Mild amount of free fluid is noted in the dependent portion of the pelvis. Stable cystic mass is noted in the left pelvis measuring 6.9 x 3.6 cm. Urinary bladder is decompressed. Mild anasarca is noted.  IMPRESSION: Interval development of moderate right hydroureteronephrosis without obstructing calculus; this most likely is due to stricture involving the distal right ureter.  Mild anasarca is noted.  Interval placement of biliary stent through common bile  duct into duodenum. Pancreatic head appears to be enlarged compared to prior exam, concerning for inflammation or possibly neoplasm.  Mild bilateral pleural effusions are noted.  Interval development of ill-defined soft tissue density in the porta hepatis region concerning for adenopathy or  neoplasm, which appears to be compressing the main portal vein and resulting in portal hypertension and the development of collateral veins within the peritoneal space.  Mildly enlarged mesenteric adenopathy is again noted which appears to be slightly enlarged compared to prior exam and is concerning for metastatic disease.  Stable left adnexal complex cystic abnormality compared to prior exam.   Electronically Signed   By: Sabino Dick M.D.   On: 01/22/2014 14:15   US Venous Img Upper Uni Left  01/23/2014   CLINICAL DATA:  Left upper extremity edema. Recent peripheral IV placement.  EXAM: LEFT UPPER EXTREMITY VENOUS DOPPLER ULTRASOUND  TECHNIQUE: Gray-scale sonography with graded compression, as well as color Doppler and duplex ultrasound were performed to evaluate the upper extremity deep venous system from the level of the subclavian vein and including the jugular, axillary, basilic, radial, ulnar and upper cephalic vein. Spectral Doppler was utilized to evaluate flow at rest and with distal augmentation maneuvers.  COMPARISON:  None.  FINDINGS: Internal Jugular Vein: No evidence of thrombus. Normal compressibility, respiratory phasicity and response to augmentation.  Subclavian Vein: No evidence of thrombus. Normal compressibility, respiratory phasicity and response to augmentation.  Axillary Vein: No evidence of thrombus. Normal compressibility, respiratory phasicity and response to augmentation.  Cephalic Vein: No evidence of thrombus. Normal compressibility, respiratory phasicity and response to augmentation.  Basilic Vein: No evidence of thrombus. Normal compressibility, respiratory phasicity and response to augmentation.  Brachial Veins: No evidence of thrombus. Normal compressibility, respiratory phasicity and response to augmentation.  Radial Veins: No evidence of thrombus. Normal compressibility, respiratory phasicity and response to augmentation.  Ulnar Veins: No evidence of thrombus. Normal  compressibility, respiratory phasicity and response to augmentation.  Venous Reflux:  None visualized.  Other Findings: No evidence of superficial thrombophlebitis or abnormal fluid collection.  IMPRESSION: No evidence of left upper extremity deep venous thrombosis or superficial thrombophlebitis.   Electronically Signed   By: Aletta Edouard M.D.   On: 01/23/2014 17:22   Dg Abd Acute W/chest  01/22/2014   CLINICAL DATA:  Acute chest and abdominal pain. Shortness of breath.  EXAM: ACUTE ABDOMEN SERIES (ABDOMEN 2 VIEW & CHEST 1 VIEW)  COMPARISON:  Chest radiograph January 16, 2014.  FINDINGS: Biliary stent is noted. No abnormal bowel dilatation is noted. Moderate stool burden is noted. No pneumoperitoneum is noted. Stable cardiomediastinal silhouette. Sternotomy wires are noted. Right subclavian Port-A-Cath is again noted with distal tip in expected position of right innominate vein. Minimal right basilar opacity is noted most consistent with subsegmental atelectasis. Minimal bilateral pleural effusions are noted.  IMPRESSION: Minimal bilateral pleural effusions. Minimal right basilar subsegmental atelectasis.  No evidence of bowel obstruction or ileus.   Electronically Signed   By: Sabino Dick M.D.   On: 01/22/2014 12:29    Medications: I have reviewed the patient's current medications.  Assessment/Plan: 1. 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 Results from [facility] demonstrated a mutation in her malignancy that may respond to Afinitor (mTor inhibitor). With this data, we got Afinitor approved and it was delivered to her home on Friday, 01/11/2014. She has not yet started the  medication due to hospitalization.  Patient wishes to pursue further therapy and we will arrange for Topotecan IV chemotherapy today at [facility].  This  treatment is given in a day 1, 8, 15 every 28 day fashion.  Nurse Navigator informed of this treatment plan. Consent needed and will be ascertained in the clinic prior to administration of chemotherapy. Treatment plan built appropriately. Labs today: CBC diff, CMET, CA 125. 2. Ill-defined soft tissue density in the porta hepatis region concerning for adenopathy or neoplasm, which appears to be compressing the main portal vein and resulting in portal hypertension and the development of collateral veins within the peritoneal space.  3. Mildly enlarged mesenteric adenopathy is again noted which appears to be slightly enlarged compared to prior exam and is concerning for metastatic disease.  4. Interval development of moderate right hydroureteronephrosis without  obstructing calculus  5. Normocytic, normochromic anemia probably due to previous chemotherapy. S/P 2 units of PRBCs on 01/23/2014.  Patient and plan discussed with Dr. Farrel Gobble and he is in agreement with the aforementioned.     LOS: 2 days    Jaqualin Serpa 01/24/2014

## 2014-01-24 NOTE — Progress Notes (Signed)
[facility] PROGRESS NOTE  Erika Moreno YHC:[id number] DOB: 11/29/1945 DOA: 01/22/2014 PCP: [name], MD  Assessment/Plan: 1. Abdominal pain. Patient recently had stones in CBD and had CBD stent placed. This was reevaluated on repeat CT which showed that biliary stent was still in place. She was seen by gastroenterology who did not feel that the stent needed to be removed/adjusted. Liver function tests appear to be improved since prior hospitalization. It is felt that her pain is possibly related to progression of her underlying cancer/mass that is compressing the porta hepatis. No further intervention was recommended by gastroenterology. Oncology has also seen the patient and treatment will be supportive at this point with antiemetics and pain management. Advance diet as tolerated. 2. Hydronephrosis of the right kidney. Possibly related to stricture. No obstructing stone was noted. Her renal function appears to be stable. She does not appear to have any flank/pelvic pain. I am doubtful that this is a significant finding in her current presentation and may likely be an incidental finding on CT imaging. Family wishes to discuss this further with urology. Will request urology to see the patient in a.m. Continue to follow renal function 3. Possible pneumonia. Patient started on Rocephin and azithromycin. Continue to follow 4. Anasarca/volume overload. Possibly related to hypoalbuminemia and recent IV fluids during her previous and current hospitalization. Ultrasound of left upper extremity negative for DVT. She was started on intravenous Lasix, although intake and output has been unreliable since patient has not been collected her urine. Continue current treatments 5. Anemia of chronic disease, possibly related to previous chemotherapy. Improved after 2 units of PRBC. 6. 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 Appreciate oncology assistance. It appears that her malignancy has  continued to progress despite receiving treatment. Further options for continued IV chemotherapy versus hospice care was discussed with the patient. After further discussion with oncology today, patient wishes to continue with chemotherapy. Plans are for patient to receive first dose of IV chemotherapy as an inpatient today. 7. Anxiety. Patient restarted on benzodiazepines.  Code Status: Full code Family Communication: Discussed with the patient and daughter at the bedside Disposition Plan: Pending hospital course   Consultants:  Gastroenterology  Oncology  Procedures:  Antibiotics:  Ceftriaxone 10/20  Azithromycin 10/20  HPI/Subjective: Feeling mildly improved today. Feels that nausea and vomiting is getting better.  Objective: Filed Vitals:   01/24/14 1437  BP: 146/84  Pulse: 72  Temp: 98.3 F (36.8 C)  Resp: 20    Intake/Output Summary (Last 24 hours) at 01/24/14 1923 Last data filed at 01/24/14 1836  Gross per 24 hour  Intake   1200 ml  Output    400 ml  Net    800 ml   Filed Weights   01/22/14 1019 01/22/14 1701  Weight: 61.236 kg (135 lb) 66 kg (145 lb 8.1 oz)    Exam:   General:  NAD  Cardiovascular: s1, s2, rrr  Respiratory: diminished breath sounds at bases  Abdomen: soft, tender in upper abdomen, bs+  Musculoskeletal: 2+ in LE bilaterally, LUE also appears edematous   Data Reviewed: Basic Metabolic Panel:  Recent Labs Lab 01/22/14 1051 01/23/14 0637 01/24/14 0911  NA 141 138 137  K 3.5* 3.7 3.8  CL 95* 95* 96  CO2 35* 31 30  GLUCOSE 103* 102* 164*  BUN 15 13 14   CREATININE 0.92 0.99 1.07  CALCIUM 9.3 8.5 9.1   Liver Function Tests:  Recent Labs Lab 01/22/14 1051 01/23/14 0637 01/24/14 0911  AST 31 23 22   ALT 24 20 18   ALKPHOS 323* 286* 277*  BILITOT 1.0 1.0 1.2  PROT 6.7 6.0 6.7  ALBUMIN 2.4* 2.3* 2.5*    Recent Labs Lab 01/22/14 1051  LIPASE 33   No results found for this basename: AMMONIA,  in the last 168  hours CBC:  Recent Labs Lab 01/22/14 1051 01/23/14 0637 01/24/14 0911  WBC 5.2 4.6 5.1  NEUTROABS 3.8  --  3.9  HGB 8.6* 7.9* 11.0*  HCT 27.7* 25.4* 33.8*  MCV 93.9 93.4 90.9  PLT 242 269 249   Cardiac Enzymes: No results found for this basename: CKTOTAL, CKMB, CKMBINDEX, TROPONINI,  in the last 168 hours BNP (last 3 results)  Recent Labs  02/10/13 1655  PROBNP 193.2*   CBG: No results found for this basename: GLUCAP,  in the last 168 hours  Recent Results (from the past 240 hour(s))  MRSA PCR SCREENING     Status: Abnormal   Collection Time    01/15/14  4:43 PM      Result Value Ref Range Status   MRSA by PCR POSITIVE (*) NEGATIVE Final   Comment:            The GeneXpert MRSA Assay (FDA     approved for NASAL specimens     only), is one component of a     comprehensive MRSA colonization     surveillance program. It is not     intended to diagnose MRSA     infection nor to guide or     monitor treatment for     MRSA infections.     RESULT CALLED TO, READ BACK BY AND VERIFIED WITH:     SCHONEITZ,L ON 01/15/2014 AT 1830 BY ISLEY,B      Studies: US Venous Img Upper Uni Left  01/23/2014   CLINICAL DATA:  Left upper extremity edema. Recent peripheral IV placement.  EXAM: LEFT UPPER EXTREMITY VENOUS DOPPLER ULTRASOUND  TECHNIQUE: Gray-scale sonography with graded compression, as well as color Doppler and duplex ultrasound were performed to evaluate the upper extremity deep venous system from the level of the subclavian vein and including the jugular, axillary, basilic, radial, ulnar and upper cephalic vein. Spectral Doppler was utilized to evaluate flow at rest and with distal augmentation maneuvers.  COMPARISON:  None.  FINDINGS: Internal Jugular Vein: No evidence of thrombus. Normal compressibility, respiratory phasicity and response to augmentation.  Subclavian Vein: No evidence of thrombus. Normal compressibility, respiratory phasicity and response to augmentation.   Axillary Vein: No evidence of thrombus. Normal compressibility, respiratory phasicity and response to augmentation.  Cephalic Vein: No evidence of thrombus. Normal compressibility, respiratory phasicity and response to augmentation.  Basilic Vein: No evidence of thrombus. Normal compressibility, respiratory phasicity and response to augmentation.  Brachial Veins: No evidence of thrombus. Normal compressibility, respiratory phasicity and response to augmentation.  Radial Veins: No evidence of thrombus. Normal compressibility, respiratory phasicity and response to augmentation.  Ulnar Veins: No evidence of thrombus. Normal compressibility, respiratory phasicity and response to augmentation.  Venous Reflux:  None visualized.  Other Findings: No evidence of superficial thrombophlebitis or abnormal fluid collection.  IMPRESSION: No evidence of left upper extremity deep venous thrombosis or superficial thrombophlebitis.   Electronically Signed   By: Aletta Edouard M.D.   On: 01/23/2014 17:22    Scheduled Meds: . aspirin EC  81 mg Oral Daily  . azithromycin  500 mg Oral q1800  . cefTRIAXone (ROCEPHIN)  IV  1  g Intravenous Q24H  . DULoxetine  30 mg Oral Daily  . enoxaparin (LOVENOX) injection  40 mg Subcutaneous Q24H  . furosemide  20 mg Intravenous BID  . isosorbide mononitrate  60 mg Oral Daily  . ondansetron  4 mg Oral TID AC, HS, 0200  . pantoprazole  40 mg Oral BID AC  . predniSONE  5 mg Oral Q breakfast   Continuous Infusions:   Active Problems:   Cancer of unknown origin   Cholangitis   Dilated cbd, acquired   Hydronephrosis of right kidney   Hydronephrosis    Time spent: 92mins    MEMON,JEHANZEB  [facility] Pager [phone]. If 7PM-7AM, please contact night-coverage at [email or web address], password [facility] 01/24/2014, 7:23 PM  LOS: 2 days

## 2014-01-24 NOTE — Patient Instructions (Signed)
[facility] Discharge Instructions for Patients Receiving Chemotherapy  Today you received the following chemotherapy agents Topotecan (first dose). To help prevent nausea and vomiting after your treatment, we encourage you to take your nausea medication as instructed. If you develop nausea and vomiting that is not controlled by your nausea medication, call the clinic. If it is after clinic hours your family physician or the after hours number for the clinic or go to the Emergency Department.   BELOW ARE SYMPTOMS THAT SHOULD BE REPORTED IMMEDIATELY:  *FEVER GREATER THAN 101.0 F  *CHILLS WITH OR WITHOUT FEVER  NAUSEA AND VOMITING THAT IS NOT CONTROLLED WITH YOUR NAUSEA MEDICATION  *UNUSUAL SHORTNESS OF BREATH  *UNUSUAL BRUISING OR BLEEDING  TENDERNESS IN MOUTH AND THROAT WITH OR WITHOUT PRESENCE OF ULCERS  *URINARY PROBLEMS  *BOWEL PROBLEMS  UNUSUAL RASH Items with * indicate a potential emergency and should be followed up as soon as possible.  One of the nurses will contact you 24 hours after your treatment. Please let the nurse know about any problems that you may have experienced. Feel free to call the clinic you have any questions or concerns. The clinic phone number is [phone].   I have been informed and understand all the instructions given to me. I know to contact the clinic, my physician, or go to the Emergency Department if any problems should occur. I do not have any questions at this time, but understand that I may call the clinic during office hours or the Patient Navigator at [phone] should I have any questions or need assistance in obtaining follow up care.    __________________________________________  _____________  __________ Signature of Patient or Authorized Representative            Date                   Time    __________________________________________ Nurse's Signature

## 2014-01-24 NOTE — Progress Notes (Signed)
Educated patient on importance of measuring output. Urine hat to measure output in bathroom on toilet, patient continues to not use.

## 2014-01-24 NOTE — Progress Notes (Signed)
Pt said that her anxiety medication was not lasting the full 6 hours and that by the time the Xanax was given to her that she was shaking so badly. She asked if [name] would increase her mg or change her from every 6 hours to every 4 hours. [name] said he would change the Xanax prescription in [facility]. Tom also to add Imodium to her current prn medications on the inpatient side in case she develops diarrhea. Patient needed her appointments changed to Mondays. Patient is going to continue through Cycle I of chemo on Thursdays but starting with Cycle II she will be changing to Monday chemo appointments. Appt list given to patient. Pt is currently an inpatient and receiving nausea medication via IV if she needs it. Patient says that she has Reglan at home and that it works for her nausea.

## 2014-01-24 NOTE — Progress Notes (Signed)
PHARMACIST - PHYSICIAN COMMUNICATION DR:   Memon CONCERNING: Antibiotic IV to Oral Route Change Policy  RECOMMENDATION: This patient is receiving Zithromax by the intravenous route.  Based on criteria approved by the Pharmacy and Therapeutics Committee, the antibiotic(s) is/are being converted to the equivalent oral dose form(s).  DESCRIPTION: These criteria include:  Patient being treated for a respiratory tract infection, urinary tract infection, cellulitis or clostridium difficile associated diarrhea if on metronidazole  The patient is not neutropenic and does not exhibit a GI malabsorption state  The patient is eating (either orally or via tube) and/or has been taking other orally administered medications for a least 24 hours  The patient is improving clinically and has a Tmax < 100.5  If you have questions about this conversion, please contact the Pharmacy Department  [x]  ( [phone] )  [place] []  ( [phone] )  [place]  []  ( [phone] )  [facility] []  ( [phone] )  [facility]   S. Ej Pinson, PharmD  

## 2014-01-25 ENCOUNTER — Encounter (HOSPITAL_COMMUNITY): Payer: Self-pay | Admitting: *Deleted

## 2014-01-25 ENCOUNTER — Encounter (HOSPITAL_COMMUNITY)
Admission: EM | Disposition: A | Discharge status: Home Health | Payer: Self-pay | Source: Home / Self Care | Attending: Internal Medicine

## 2014-01-25 ENCOUNTER — Inpatient Hospital Stay (HOSPITAL_COMMUNITY): Payer: PRIVATE HEALTH INSURANCE | Admitting: Anesthesiology

## 2014-01-25 ENCOUNTER — Encounter (HOSPITAL_COMMUNITY): Payer: PRIVATE HEALTH INSURANCE | Admitting: Anesthesiology

## 2014-01-25 ENCOUNTER — Other Ambulatory Visit: Payer: Self-pay | Admitting: Urology

## 2014-01-25 ENCOUNTER — Encounter (HOSPITAL_COMMUNITY): Payer: Self-pay | Admitting: Certified Registered Nurse Anesthetist

## 2014-01-25 DIAGNOSIS — C801 Malignant (primary) neoplasm, unspecified: Secondary | ICD-10-CM

## 2014-01-25 DIAGNOSIS — C569 Malignant neoplasm of unspecified ovary: Secondary | ICD-10-CM | POA: Diagnosis present

## 2014-01-25 DIAGNOSIS — C786 Secondary malignant neoplasm of retroperitoneum and peritoneum: Secondary | ICD-10-CM | POA: Diagnosis present

## 2014-01-25 HISTORY — PX: CYSTOSCOPY WITH STENT PLACEMENT: SHX5790

## 2014-01-25 LAB — TYPE AND SCREEN
ABO/RH(D): A POS
Antibody Screen: NEGATIVE
Unit division: 0
Unit division: 0

## 2014-01-25 LAB — CBC
HCT: 32 % — ABNORMAL LOW (ref 36.0–46.0)
Hemoglobin: 10.5 g/dL — ABNORMAL LOW (ref 12.0–15.0)
MCH: 29.7 pg (ref 26.0–34.0)
MCHC: 32.8 g/dL (ref 30.0–36.0)
MCV: 90.4 fL (ref 78.0–100.0)
PLATELETS: 257 10*3/uL (ref 150–400)
RBC: 3.54 MIL/uL — ABNORMAL LOW (ref 3.87–5.11)
RDW: 17.1 % — ABNORMAL HIGH (ref 11.5–15.5)
WBC: 3.6 10*3/uL — ABNORMAL LOW (ref 4.0–10.5)

## 2014-01-25 LAB — BASIC METABOLIC PANEL
Anion gap: 14 (ref 5–15)
BUN: 20 mg/dL (ref 6–23)
CO2: 28 mEq/L (ref 19–32)
CREATININE: 1.18 mg/dL — AB (ref 0.50–1.10)
Calcium: 9.3 mg/dL (ref 8.4–10.5)
Chloride: 93 mEq/L — ABNORMAL LOW (ref 96–112)
GFR calc Af Amer: 54 mL/min — ABNORMAL LOW (ref >90)
GFR, EST NON AFRICAN AMERICAN: 46 mL/min — AB (ref >90)
Glucose, Bld: 154 mg/dL — ABNORMAL HIGH (ref 70–99)
Potassium: 4.1 mEq/L (ref 3.7–5.3)
SODIUM: 135 meq/L — AB (ref 137–147)

## 2014-01-25 LAB — CA 125: CA 125: 1170 U/mL — ABNORMAL HIGH (ref <35)

## 2014-01-25 SURGERY — CYSTOSCOPY, WITH STENT INSERTION
Anesthesia: General | Site: Urethra | Laterality: Right

## 2014-01-25 MED ORDER — LOPERAMIDE HCL 2 MG PO CAPS: 2.0000 mg | ORAL_CAPSULE | ORAL | Status: DC | PRN

## 2014-01-25 MED ORDER — ONDANSETRON HCL 4 MG/2ML IJ SOLN
INTRAMUSCULAR | Status: AC
  Filled 2014-01-25: qty 2

## 2014-01-25 MED ORDER — KETOROLAC TROMETHAMINE 30 MG/ML IJ SOLN
INTRAMUSCULAR | Status: AC
  Filled 2014-01-25: qty 1

## 2014-01-25 MED ORDER — ACETAMINOPHEN 10 MG/ML IV SOLN
1000.0000 mg | Freq: Once | INTRAVENOUS | Status: AC
  Administered 2014-01-25: 1000 mg via INTRAVENOUS
  Filled 2014-01-25: qty 100

## 2014-01-25 MED ORDER — PROPOFOL 10 MG/ML IV BOLUS
INTRAVENOUS | Status: DC | PRN
  Administered 2014-01-25: 80 mg via INTRAVENOUS
  Administered 2014-01-25: 120 mg via INTRAVENOUS

## 2014-01-25 MED ORDER — MIDAZOLAM HCL 5 MG/5ML IJ SOLN
INTRAMUSCULAR | Status: DC | PRN
  Administered 2014-01-25: 2 mg via INTRAVENOUS

## 2014-01-25 MED ORDER — LIDOCAINE HCL (CARDIAC) 20 MG/ML IV SOLN
INTRAVENOUS | Status: DC | PRN
  Administered 2014-01-25: 60 mg via INTRAVENOUS

## 2014-01-25 MED ORDER — ONDANSETRON HCL 4 MG/2ML IJ SOLN
INTRAMUSCULAR | Status: DC | PRN
  Administered 2014-01-25: 4 mg via INTRAVENOUS

## 2014-01-25 MED ORDER — PROPOFOL 10 MG/ML IV BOLUS
INTRAVENOUS | Status: AC
  Filled 2014-01-25: qty 20

## 2014-01-25 MED ORDER — HYDRALAZINE HCL 20 MG/ML IJ SOLN
5.0000 mg | Freq: Once | INTRAMUSCULAR | Status: AC
  Administered 2014-01-25: 5 mg via INTRAVENOUS

## 2014-01-25 MED ORDER — PROMETHAZINE HCL 25 MG/ML IJ SOLN
6.2500 mg | INTRAMUSCULAR | Status: DC | PRN
  Administered 2014-01-27: via INTRAVENOUS
  Filled 2014-01-25: qty 1

## 2014-01-25 MED ORDER — SODIUM CHLORIDE 0.9 % IR SOLN
Status: DC | PRN
  Administered 2014-01-25: 3000 mL

## 2014-01-25 MED ORDER — HYDROMORPHONE HCL 1 MG/ML IJ SOLN
INTRAMUSCULAR | Status: AC
  Filled 2014-01-25: qty 1

## 2014-01-25 MED ORDER — FENTANYL CITRATE 0.05 MG/ML IJ SOLN
INTRAMUSCULAR | Status: AC
  Filled 2014-01-25: qty 2

## 2014-01-25 MED ORDER — KETOROLAC TROMETHAMINE 30 MG/ML IJ SOLN
15.0000 mg | Freq: Once | INTRAMUSCULAR | Status: AC | PRN
  Administered 2014-01-25: 30 mg via INTRAVENOUS

## 2014-01-25 MED ORDER — HYDROMORPHONE HCL 1 MG/ML IJ SOLN
0.2500 mg | INTRAMUSCULAR | Status: DC | PRN
  Administered 2014-01-25 (×4): 0.5 mg via INTRAVENOUS

## 2014-01-25 MED ORDER — IOHEXOL 300 MG/ML  SOLN
INTRAMUSCULAR | Status: DC | PRN
  Administered 2014-01-25: 8 mL via URETHRAL

## 2014-01-25 MED ORDER — FUROSEMIDE 10 MG/ML IJ SOLN
20.0000 mg | Freq: Two times a day (BID) | INTRAMUSCULAR | Status: DC
  Administered 2014-01-25 – 2014-01-26 (×3): 20 mg via INTRAVENOUS
  Filled 2014-01-25 (×4): qty 2

## 2014-01-25 MED ORDER — FENTANYL CITRATE 0.05 MG/ML IJ SOLN
25.0000 ug | INTRAMUSCULAR | Status: DC | PRN
  Administered 2014-01-25: 50 ug via INTRAVENOUS
  Administered 2014-01-25 (×2): 25 ug via INTRAVENOUS

## 2014-01-25 MED ORDER — HYDRALAZINE HCL 20 MG/ML IJ SOLN
INTRAMUSCULAR | Status: AC
  Filled 2014-01-25: qty 1

## 2014-01-25 MED ORDER — FENTANYL CITRATE 0.05 MG/ML IJ SOLN
INTRAMUSCULAR | Status: DC | PRN
  Administered 2014-01-25 (×2): 50 ug via INTRAVENOUS

## 2014-01-25 MED ORDER — LACTATED RINGERS IV SOLN
INTRAVENOUS | Status: DC
  Administered 2014-01-25: 1000 mL via INTRAVENOUS

## 2014-01-25 MED ORDER — LACTATED RINGERS IV SOLN
INTRAVENOUS | Status: DC | PRN
  Administered 2014-01-25: 16:00:00 via INTRAVENOUS

## 2014-01-25 SURGICAL SUPPLY — 15 items
BAG URO CATCHER STRL LF (DRAPE) ×3 IMPLANT
BASKET ZERO TIP NITINOL 2.4FR (BASKET) IMPLANT
BSKT STON RTRVL ZERO TP 2.4FR (BASKET)
CATH INTERMIT  6FR 70CM (CATHETERS) ×2 IMPLANT
CLOTH BEACON ORANGE TIMEOUT ST (SAFETY) ×3 IMPLANT
DRAPE CAMERA CLOSED 9X96 (DRAPES) ×3 IMPLANT
GLOVE BIOGEL M STRL SZ7.5 (GLOVE) ×3 IMPLANT
GOWN STRL REUS W/TWL LRG LVL3 (GOWN DISPOSABLE) ×6 IMPLANT
GUIDEWIRE ANG ZIPWIRE 038X150 (WIRE) ×2 IMPLANT
GUIDEWIRE STR DUAL SENSOR (WIRE) ×3 IMPLANT
MANIFOLD NEPTUNE II (INSTRUMENTS) ×3 IMPLANT
PACK CYSTO (CUSTOM PROCEDURE TRAY) ×3 IMPLANT
STENT URET 6FRX24 CONTOUR (STENTS) ×2 IMPLANT
TUBING CONNECTING 10 (TUBING) ×2 IMPLANT
TUBING CONNECTING 10' (TUBING) ×1

## 2014-01-25 NOTE — Anesthesia Preprocedure Evaluation (Addendum)
Anesthesia Evaluation  Patient identified by MRN, date of birth, ID band Patient awake    Reviewed: Allergy & Precautions, H&P , NPO status , Patient's Chart, lab work & pertinent test results  Airway Mallampati: II TM Distance: >3 FB Neck ROM: Full    Dental no notable dental hx.    Pulmonary neg pulmonary ROS, former smoker,  breath sounds clear to auscultation  Pulmonary exam normal       Cardiovascular hypertension, Pt. on medications + CAD, + Past MI and + CABG Rhythm:Regular Rate:Normal     Neuro/Psych negative neurological ROS  negative psych ROS   GI/Hepatic negative GI ROS, Neg liver ROS,   Endo/Other  Hypothyroidism   Renal/GU negative Renal ROS  negative genitourinary   Musculoskeletal negative musculoskeletal ROS (+)   Abdominal   Peds negative pediatric ROS (+)  Hematology  (+) anemia ,   Anesthesia Other Findings   Reproductive/Obstetrics negative OB ROS                        Anesthesia Physical Anesthesia Plan  ASA: IV  Anesthesia Plan: General   Post-op Pain Management:    Induction: Intravenous  Airway Management Planned: LMA  Additional Equipment:   Intra-op Plan:   Post-operative Plan: Extubation in OR  Informed Consent: I have reviewed the patients History and Physical, chart, labs and discussed the procedure including the risks, benefits and alternatives for the proposed anesthesia with the patient or authorized representative who has indicated his/her understanding and acceptance.   Dental advisory given  Plan Discussed with: CRNA  Anesthesia Plan Comments:        Anesthesia Quick Evaluation

## 2014-01-25 NOTE — Progress Notes (Signed)
Patient ID: Erika Moreno, female   DOB: 08-31-1945, 68 y.o.   MRN: [id number]   Right ureteral stent placed tonight to relieve right ureteral obstruction.  Patient should follow up with Dr. Jeffie Pollock in 6-8 weeks for further evaluation and ongoing treatment.

## 2014-01-25 NOTE — Progress Notes (Signed)
Spoke with patient. Reports she hasn't had any different issues since chemo yesterday. She is still an inpatient and is being transferred to [facility] today for stent placement right kidney. Denied fever,N,V, or chills. No other complaints.

## 2014-01-25 NOTE — Anesthesia Postprocedure Evaluation (Signed)
  Anesthesia Post-op Note  Patient: Erika Moreno  Procedure(s) Performed: Procedure(s) (LRB): CYSTOSCOPY WITH RETROGRADE AND RIGHT STENT PLACEMENT (Right)  Patient Location: PACU  Anesthesia Type: General  Level of Consciousness: awake and alert   Airway and Oxygen Therapy: Patient Spontanous Breathing  Post-op Pain: mild  Post-op Assessment: Post-op Vital signs reviewed, Patient's Cardiovascular Status Stable, Respiratory Function Stable, Patent Airway and No signs of Nausea or vomiting  Last Vitals:  Filed Vitals:   01/25/14 1815  BP: 133/68  Pulse: 73  Temp: 36.6 C  Resp: 18    Post-op Vital Signs: stable   Complications: No apparent anesthesia complications

## 2014-01-25 NOTE — Progress Notes (Signed)
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 LFTs had improved after stent placement she was discharged home. She returned to the hospital with worsening right upper quadrant/epigastric pain as well as flank pain. Repeat CT scan of the abdomen and pelvis showed a biliary stent was in place and LFTs were unremarkable. She was seen by gastroenterology who did not feel that further intervention for her biliary tree it was necessary and recommended supportive treatment. It was felt that her peripheral quadrant/epigastric pain evaluate progression of her malignancy with mass compressing porta hepatis. She's been placed on analgesics as well as antiemetics with symptomatic improvement. Regarding her malignancy, she was seen by oncology and goals of care were discussed. She wishes to continue aggressive treatments and received IV chemotherapy on 10/22 as an inpatient. She will followup with oncology for further treatment as an outpatient. She was noted to have right-sided hydronephrosis on CT imaging. Creatinine has slowly started to trend up. Since she is complaining of flank pain, she was seen by urology who recommended stent placement. This is likely related to malignant stricture. Since his procedure cannot be performed at [facility], she'll be transferred to [facility] for further treatment. Provider she does not have any complications related to her stent, once her symptoms are reasonably controlled, she can likely be discharged home for outpatient oncology followup. I have discussed the case with Dr. Ree Kida who has accepted the patient in transfer  Assessment/Plan: 1. Abdominal pain. Patient  recently had stones in CBD and had CBD stent placed. This was reevaluated on repeat CT which showed that biliary stent was still in place. She was seen by gastroenterology who did not feel that the stent needed to be removed/adjusted. Liver function tests appear to be improved since prior hospitalization. It is felt that her pain is possibly related to progression of her underlying cancer/mass that is compressing the porta hepatis. No further intervention was recommended by gastroenterology. Oncology has also seen the patient and treatment will be supportive at this point with antiemetics and pain management. Advance diet as tolerated. 2. Hydronephrosis of the right kidney. Possibly related to stricture. No obstructing stone was noted. Her creatinine has started to trend up. She is having flank pain. Patient was seen by Dr. Jeffie Pollock with urology and recommendations are for cystoscopy with ureteral stent placement. Unfortunately, this cannot be performed at [facility] and she will need transfer to [facility]. 3. Possible pneumonia. Patient started on Rocephin and azithromycin on admission. She does not appear to have significant cough/ shortness of breath at this time. Will discontinue azithromycin, but continue rocephin in light of #2. Continue to follow 4. Anasarca/volume overload. Possibly related to hypoalbuminemia and recent IV fluids during her previous and current hospitalization. Ultrasound of left upper extremity negative for DVT. She was started on intravenous Lasix, although intake and output has been unreliable since patient has not been collected her urine. Continue current treatments 5. Anemia of chronic disease, possibly related to previous chemotherapy. Improved after 2 units of PRBC. 6. 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 Appreciate oncology assistance. It appears that her malignancy has continued to progress despite receiving treatment. Further options for continued  IV chemotherapy versus hospice care was discussed  with the patient. After further discussion with oncology, patient wishes to continue with chemotherapy. She received first dose of new IV chemotherapy with topotecan yesterday 7. Anxiety. Patient restarted on benzodiazepines.  Code Status: Full code Family Communication: Discussed with the patient and daughter at the bedside Disposition Plan: Transfer to [facility] for further care.   Consultants:  Gastroenterology  Oncology  Urology  Procedures:  Antibiotics:  Ceftriaxone 10/20>>  Azithromycin 10/20>>10/23  HPI/Subjective: Continues to have flank pain. Epigastric pain is better. Nausea and vomiting is better.  Objective: Filed Vitals:   01/25/14 0751  BP: 167/87  Pulse: 72  Temp: 98.4 F (36.9 C)  Resp: 20    Intake/Output Summary (Last 24 hours) at 01/25/14 1118 Last data filed at 01/25/14 0640  Gross per 24 hour  Intake    530 ml  Output    450 ml  Net     80 ml   Filed Weights   01/22/14 1019 01/22/14 1701  Weight: 61.236 kg (135 lb) 66 kg (145 lb 8.1 oz)    Exam:   General:  NAD  Cardiovascular: s1, s2, rrr  Respiratory: diminished breath sounds at bases  Abdomen: soft, tender in upper abdomen, bs+  Musculoskeletal: 2+ in LE bilaterally, LUE also appears edematous   Data Reviewed: Basic Metabolic Panel:  Recent Labs Lab 01/22/14 1051 01/23/14 0637 01/24/14 0911 01/25/14 0523  NA 141 138 137 135*  K 3.5* 3.7 3.8 4.1  CL 95* 95* 96 93*  CO2 35* 31 30 28   GLUCOSE 103* 102* 164* 154*  BUN 15 13 14 20   CREATININE 0.92 0.99 1.07 1.18*  CALCIUM 9.3 8.5 9.1 9.3   Liver Function Tests:  Recent Labs Lab 01/22/14 1051 01/23/14 0637 01/24/14 0911  AST 31 23 22   ALT 24 20 18   ALKPHOS 323* 286* 277*  BILITOT 1.0 1.0 1.2  PROT 6.7 6.0 6.7  ALBUMIN 2.4* 2.3* 2.5*    Recent Labs Lab 01/22/14 1051  LIPASE 33   No results found for this basename: AMMONIA,  in the last 168  hours CBC:  Recent Labs Lab 01/22/14 1051 01/23/14 0637 01/24/14 0911 01/25/14 0523  WBC 5.2 4.6 5.1 3.6*  NEUTROABS 3.8  --  3.9  --   HGB 8.6* 7.9* 11.0* 10.5*  HCT 27.7* 25.4* 33.8* 32.0*  MCV 93.9 93.4 90.9 90.4  PLT 242 269 249 257   Cardiac Enzymes: No results found for this basename: CKTOTAL, CKMB, CKMBINDEX, TROPONINI,  in the last 168 hours BNP (last 3 results)  Recent Labs  02/10/13 1655  PROBNP 193.2*   CBG: No results found for this basename: GLUCAP,  in the last 168 hours  Recent Results (from the past 240 hour(s))  MRSA PCR SCREENING     Status: Abnormal   Collection Time    01/15/14  4:43 PM      Result Value Ref Range Status   MRSA by PCR POSITIVE (*) NEGATIVE Final   Comment:            The GeneXpert MRSA Assay (FDA     approved for NASAL specimens     only), is one component of a     comprehensive MRSA colonization     surveillance program. It is not     intended to diagnose MRSA     infection nor to guide or     monitor treatment for     MRSA infections.     RESULT CALLED TO, READ  BACK BY AND VERIFIED WITH:     SCHONEITZ,L ON 01/15/2014 AT 1830 BY ISLEY,B      Studies: US Venous Img Upper Uni Left  01/23/2014   CLINICAL DATA:  Left upper extremity edema. Recent peripheral IV placement.  EXAM: LEFT UPPER EXTREMITY VENOUS DOPPLER ULTRASOUND  TECHNIQUE: Gray-scale sonography with graded compression, as well as color Doppler and duplex ultrasound were performed to evaluate the upper extremity deep venous system from the level of the subclavian vein and including the jugular, axillary, basilic, radial, ulnar and upper cephalic vein. Spectral Doppler was utilized to evaluate flow at rest and with distal augmentation maneuvers.  COMPARISON:  None.  FINDINGS: Internal Jugular Vein: No evidence of thrombus. Normal compressibility, respiratory phasicity and response to augmentation.  Subclavian Vein: No evidence of thrombus. Normal compressibility,  respiratory phasicity and response to augmentation.  Axillary Vein: No evidence of thrombus. Normal compressibility, respiratory phasicity and response to augmentation.  Cephalic Vein: No evidence of thrombus. Normal compressibility, respiratory phasicity and response to augmentation.  Basilic Vein: No evidence of thrombus. Normal compressibility, respiratory phasicity and response to augmentation.  Brachial Veins: No evidence of thrombus. Normal compressibility, respiratory phasicity and response to augmentation.  Radial Veins: No evidence of thrombus. Normal compressibility, respiratory phasicity and response to augmentation.  Ulnar Veins: No evidence of thrombus. Normal compressibility, respiratory phasicity and response to augmentation.  Venous Reflux:  None visualized.  Other Findings: No evidence of superficial thrombophlebitis or abnormal fluid collection.  IMPRESSION: No evidence of left upper extremity deep venous thrombosis or superficial thrombophlebitis.   Electronically Signed   By: Aletta Edouard M.D.   On: 01/23/2014 17:22    Scheduled Meds: . aspirin EC  81 mg Oral Daily  . azithromycin  500 mg Oral q1800  . cefTRIAXone (ROCEPHIN)  IV  1 g Intravenous Q24H  . DULoxetine  30 mg Oral Daily  . enoxaparin (LOVENOX) injection  40 mg Subcutaneous Q24H  . furosemide  20 mg Intravenous BID  . isosorbide mononitrate  60 mg Oral Daily  . ondansetron  4 mg Oral TID AC, HS, 0200  . pantoprazole  40 mg Oral BID AC  . predniSONE  5 mg Oral Q breakfast   Continuous Infusions:   Active Problems:   Cancer of unknown origin   Cholangitis   Dilated cbd, acquired   Hydronephrosis of right kidney   Hydronephrosis    Time spent: 42mins    Taeya Theall  [facility] Pager [phone]. If 7PM-7AM, please contact night-coverage at [email or web address], password [facility] 01/25/2014, 11:18 AM  LOS: 3 days

## 2014-01-25 NOTE — Op Note (Signed)
Preoperative diagnosis:  1. Right ureteral obstruction   Postoperative diagnosis:  1. Right ureteral obstruction   Procedure:  1. Cystoscopy 2. Right ureteral stent placement (6 x 24)  3. Right retrograde pyelography with interpretation   Surgeon: Pryor Curia. M.D.  Anesthesia: General  Complications: None  Intraoperative findings: Right RPG demonstrated a dilated ureter and renal pelvis down to the distal ureter.  No intraluminal filling defects noted.  Findings are consistent with extrinsic obstruction.  EBL: Minimal  Specimens: None  Indication: Erika Moreno is a 68 y.o. patient with right ureteral obstruction secondary to advanced malignancy.  She was evaluated by Dr. Jeffie Pollock earlier today and recommended to undergo cystoscopy and right ureteral stent placement. After reviewing the management options for treatment, he elected to proceed with the above surgical procedure(s). We have discussed the potential benefits and risks of the procedure, side effects of the proposed treatment, the likelihood of the patient achieving the goals of the procedure, and any potential problems that might occur during the procedure or recuperation. Informed consent has been obtained.  Description of procedure:  The patient was taken to the operating room and general anesthesia was induced.  The patient was placed in the dorsal lithotomy position, prepped and draped in the usual sterile fashion, and preoperative antibiotics were administered. A preoperative time-out was performed.   Cystourethroscopy was performed.  The patient's urethra was examined and was normal. The bladder was then systematically examined in its entirety. There was no evidence for any bladder tumors, stones, or other mucosal pathology.    Attention then turned to the right ureteral orifice and a ureteral catheter was used to intubate the ureteral orifice.  Omnipaque contrast was injected through the ureteral catheter  and a retrograde pyelogram was performed with findings as dictated above.  A 0.38 sensor guidewire was then advanced up the right ureter into the renal pelvis under fluoroscopic guidance.  The wire was then backloaded through the cystoscope and a ureteral stent was advance over the wire using Seldinger technique.  The stent was positioned appropriately under fluoroscopic and cystoscopic guidance.  The wire was then removed with an adequate stent curl noted in the renal pelvis as well as in the bladder.  The bladder was then emptied and the procedure ended.  The patient appeared to tolerate the procedure well and without complications.  The patient was able to be awakened and transferred to the recovery unit in satisfactory condition.    Pryor Curia MD

## 2014-01-25 NOTE — Consult Note (Addendum)
Subjective: Erika Moreno is a 68 yo WF with stage 4  cancer of uncertain origin who was admitted on 10/20 with epigastric pain. She has carcinomatosis.   She had a biliary stent placed on 10/15 and on a CT on admission had inflammatory changes around the pancreas but also new moderate hydroureteronephrosis with obstruction in the distal ureter without a stone.  I was asked to see her in consultation by Dr. Roderic Palau for this finding.  She continues to have some band like back pain with nausea.  The pain can be severe. She has had no voiding complaints.  She had a stone remotely that she passed but no other GU history.  Her Cr has been rising and is up to 1.18. ROS:  Review of Systems  Constitutional: Positive for fever (on admission but that has resolved. ). Negative for chills.  HENT: Negative.   Eyes: Negative.   Respiratory: Positive for shortness of breath. Negative for cough.   Cardiovascular: Positive for leg swelling. Negative for chest pain and palpitations.  Gastrointestinal: Positive for nausea. Negative for abdominal pain (but she has severe back pain).  Genitourinary: Negative for dysuria, urgency and hematuria.  Musculoskeletal:       She has severe back pain with radiation into the legs.  Skin:       She has some sores on her legs from the edema  Neurological: Negative for sensory change and focal weakness.  Endo/Heme/Allergies: Negative.   Psychiatric/Behavioral: Positive for depression.       Paranoia  All other systems reviewed and are negative.  Allergies  Allergen Reactions  . Avastin [Bevacizumab] Hives and Other (See Comments)    Took Prednisone along with Avastin to decrease hives  . Morphine And Related Other (See Comments)    Hallucinates with large IV doses.  . Reglan [Metoclopramide] Other (See Comments)    Hands shaking  . Sulfa Antibiotics Itching    Past Medical History  Diagnosis Date  . 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  Cervical cancer 1984  . Myocardial infarction 20 yrs ago  . Coronary artery disease   . Anxiety   . Chronic back pain   . Depression   . GERD (gastroesophageal reflux disease)   . Hypertension   . Hypothyroidism   . Anemia   . Vitamin B 12 deficiency   . Vitamin D deficiency disease     Past Surgical History  Procedure Laterality Date  . Heart bypass      triple bypass with stents  . Coronary artery bypass graft      3 vessels  . Cardiac catheterization      stents x3  . Abdominal hysterectomy      partial  . Portacath placement Right 12/06/2012    Procedure: INSERTION PORT-A-CATH;  Surgeon: Donato Heinz, MD;  Location: [facility];  Service: General;  Laterality: Right;  . Ercp  01/15/2014    Dr. Gala Romney: biliary stricture and upstream dilation of biliary tree, s/p sphincterotomy and balloon stricture, dilation, brushing for cytology, and expandable metallic stent placed  . Sphincterotomy  01/15/2014    Procedure: SPHINCTEROTOMY;  Surgeon: Daneil Dolin, MD;  Location: [facility];  Service: Gastroenterology;;  . Biliary stent placement  01/15/2014    Procedure: BILIARY WALL STENT PLACEMENT;  Surgeon: Daneil Dolin, MD;  Location: [facility];  Service: Gastroenterology;;  . Balloon dilation  01/15/2014    Procedure: BALLOON DILATION;  Surgeon: Daneil Dolin, MD;  Location: [facility];  Service: Gastroenterology;;    History   Social History  . Marital Status: Widowed    Spouse Name: N/A    Number of Children: N/A  . Years of Education: N/A   Occupational History  . Not on file.   Social History Main Topics  . Smoking status: Former Smoker -- 2.00 packs/day for 22 years    Types: Cigarettes    Quit date: 11/21/1988  . Smokeless tobacco: Never Used  . Alcohol Use: No  . Drug Use: No  . Sexual Activity: Not on file   Other Topics Concern  . Not on file   Social History Narrative  . No narrative on file    Family History  Problem Relation Age of Onset  . Heart attack  Mother   . Heart attack Father   . Colon cancer Paternal Grandmother     Anti-infectives: Anti-infectives   Start     Dose/Rate Route Frequency Ordered Stop   01/24/14 1800  azithromycin (ZITHROMAX) tablet 500 mg     500 mg Oral Daily-1800 01/24/14 1117     01/22/14 1900  azithromycin (ZITHROMAX) 500 mg in dextrose 5 % 250 mL IVPB  Status:  Discontinued     500 mg 250 mL/hr over 60 Minutes Intravenous Every 24 hours 01/22/14 1705 01/24/14 1117   01/22/14 1800  cefTRIAXone (ROCEPHIN) 1 g in dextrose 5 % 50 mL IVPB     1 g 100 mL/hr over 30 Minutes Intravenous Every 24 hours 01/22/14 1705     01/22/14 1715  fluconazole (DIFLUCAN) tablet 150 mg     150 mg Oral  Once 01/22/14 1705 01/22/14 1750      Current Facility-Administered Medications  Medication Dose Route Frequency Provider Last Rate Last Dose  . ALPRAZolam Duanne Moron) tablet 0.25 mg  0.25 mg Oral Q4H PRN Baird Cancer, PA-C   0.25 mg at 01/25/14 0515  . aspirin EC tablet 81 mg  81 mg Oral Daily Oswald Hillock, MD   81 mg at 01/24/14 0825  . azithromycin (ZITHROMAX) tablet 500 mg  500 mg Oral q1800 Kathie Dike, MD   500 mg at 01/24/14 1645  . cefTRIAXone (ROCEPHIN) 1 g in dextrose 5 % 50 mL IVPB  1 g Intravenous Q24H Oswald Hillock, MD   1 g at 01/24/14 1644  . cyclobenzaprine (FLEXERIL) tablet 10 mg  10 mg Oral TID PRN Oswald Hillock, MD      . DULoxetine (CYMBALTA) DR capsule 30 mg  30 mg Oral Daily Oswald Hillock, MD   30 mg at 01/24/14 0825  . enoxaparin (LOVENOX) injection 40 mg  40 mg Subcutaneous Q24H Oswald Hillock, MD   40 mg at 01/24/14 1644  . furosemide (LASIX) injection 40 mg  40 mg Intravenous BID Kathie Dike, MD      . heparin lock flush 100 unit/mL  500 Units Intracatheter Daily PRN Baird Cancer, PA-C      . HYDROmorphone (DILAUDID) injection 1 mg  1 mg Intravenous Q4H PRN Oswald Hillock, MD   1 mg at 01/25/14 0515  . hydrOXYzine (ATARAX/VISTARIL) tablet 10 mg  10 mg Oral Q8H PRN Oswald Hillock, MD   10 mg at 01/25/14  0100  . isosorbide mononitrate (IMDUR) 24 hr tablet 60 mg  60 mg Oral Daily Oswald Hillock, MD   60 mg at 01/24/14 0824  . ondansetron (ZOFRAN) tablet 4 mg  4 mg Oral Q6H PRN Oswald Hillock, MD   4 mg at 01/25/14 0520   Or  . ondansetron (ZOFRAN) injection 4 mg  4 mg Intravenous Q6H PRN Oswald Hillock, MD   4 mg at 01/23/14 0518  . ondansetron (ZOFRAN-ODT) disintegrating tablet 4 mg  4 mg Oral TID AC, HS, 0200 Orvil Feil, NP   4 mg at 01/25/14 0100  . pantoprazole (PROTONIX) EC tablet 40 mg  40 mg Oral BID AC Orvil Feil, NP   40 mg at 01/24/14 1644  . predniSONE (DELTASONE) tablet 5 mg  5 mg Oral Q breakfast Oswald Hillock, MD   5 mg at 01/24/14 0825  . prochlorperazine (COMPAZINE) injection 10 mg  10 mg Intravenous Q6H PRN Kathie Dike, MD      . sodium chloride 0.9 % injection 10 mL  10 mL Intracatheter PRN Baird Cancer, PA-C      . sodium chloride 0.9 % injection 3 mL  3 mL Intracatheter PRN Baird Cancer, PA-C         Objective: Vital signs in last 24 hours: Temp:  [98 F (36.7 C)-98.8 F (37.1 C)] 98.4 F (36.9 C) (10/23 0751) Pulse Rate:  [62-77] 72 (10/23 0751) Resp:  [18-20] 20 (10/23 0751) BP: (134-167)/(84-99) 167/87 mmHg (10/23 0751) SpO2:  [91 %-98 %] 98 % (10/23 0751) Weight:  [66.769 kg (147 lb 3.2 oz)] 66.769 kg (147 lb 3.2 oz) (10/22 1000)  Intake/Output from previous day: 10/22 0701 - 10/23 0700 In: 530 [P.O.:480; IV Piggyback:50] Out: 450 [Urine:450] Intake/Output this shift:     Physical Exam  Constitutional: She is oriented to person, place, and time and well-developed, well-nourished, and in no distress.  HENT:  Head: Normocephalic and atraumatic.  Neck: Normal range of motion. Neck supple. No JVD present. No thyromegaly present.  Cardiovascular: Normal rate, regular rhythm and normal heart sounds.   Pulmonary/Chest: Effort normal and breath sounds normal. No respiratory distress.  Abdominal: Soft. She exhibits no distension and no mass. There is  tenderness (right flank and CVA tenderness that is moderate). There is no rebound and no guarding.  Genitourinary:  Deferred until cystoscopy  Musculoskeletal: She exhibits edema (in the left arm and both legs right > left). She exhibits no tenderness.  Lymphadenopathy:    She has no cervical adenopathy.  Neurological: She is alert and oriented to person, place, and time.  Skin: Skin is warm and dry.  Psychiatric: Affect and judgment normal.    Lab Results:   Recent Labs  01/24/14 0911 01/25/14 0523  WBC 5.1 3.6*  HGB 11.0* 10.5*  HCT 33.8* 32.0*  PLT 249 257   BMET  Recent Labs  01/24/14 0911 01/25/14 0523  NA 137 135*  K 3.8 4.1  CL 96 93*  CO2 30 28  GLUCOSE 164* 154*  BUN 14 20  CREATININE 1.07 1.18*  CALCIUM 9.1 9.3   PT/INR No results found for this basename: LABPROT, INR,  in the last 72 hours ABG No results found for this basename: PHART, PCO2, PO2, HCO3,  in the last 72 hours  Studies/Results: US Venous Img Upper Uni Left  01/23/2014   CLINICAL DATA:  Left upper extremity edema. Recent peripheral IV placement.  EXAM: LEFT UPPER EXTREMITY VENOUS DOPPLER ULTRASOUND  TECHNIQUE: Gray-scale sonography with graded compression, as well as color Doppler and duplex ultrasound were performed to evaluate the upper extremity deep venous system from the level of the subclavian vein and  including the jugular, axillary, basilic, radial, ulnar and upper cephalic vein. Spectral Doppler was utilized to evaluate flow at rest and with distal augmentation maneuvers.  COMPARISON:  None.  FINDINGS: Internal Jugular Vein: No evidence of thrombus. Normal compressibility, respiratory phasicity and response to augmentation.  Subclavian Vein: No evidence of thrombus. Normal compressibility, respiratory phasicity and response to augmentation.  Axillary Vein: No evidence of thrombus. Normal compressibility, respiratory phasicity and response to augmentation.  Cephalic Vein: No evidence of  thrombus. Normal compressibility, respiratory phasicity and response to augmentation.  Basilic Vein: No evidence of thrombus. Normal compressibility, respiratory phasicity and response to augmentation.  Brachial Veins: No evidence of thrombus. Normal compressibility, respiratory phasicity and response to augmentation.  Radial Veins: No evidence of thrombus. Normal compressibility, respiratory phasicity and response to augmentation.  Ulnar Veins: No evidence of thrombus. Normal compressibility, respiratory phasicity and response to augmentation.  Venous Reflux:  None visualized.  Other Findings: No evidence of superficial thrombophlebitis or abnormal fluid collection.  IMPRESSION: No evidence of left upper extremity deep venous thrombosis or superficial thrombophlebitis.   Electronically Signed   By: Aletta Edouard M.D.   On: 01/23/2014 17:22   I have reviewed her CT films, Her clinic notes and her labs.   Assessment: She has moderate right hydro that is probably from malignant obstruction with mild renal insufficiency. Her back pain is probably unrelated. She remains a candidate for second line therapy for her cancer so decompression of the kidney will be needed to maximize renal function.    Plan: She needs cystoscopy with right ureteral stenting but the [facility] OR is down for the next few days so the procedure would either need to be delayed until next Tuesday when we are back or she will need to be transferred to [facility] for the procedure.   I reviewed the risks of bleeding, infection, ureteral injury, need for a perc, thrombotic events and anesthetic complications.   CC: Dr. Kathie Dike and Dr. Hetty Ely.    LOS: 3 days    Sherlyn Ebbert J 01/25/2014   I have spoken to Dr. Roderic Palau and will proceed with scheduling a cystoscopy and stent insertion in [facility] later today.   Dr. Roderic Palau will arrange transfer to the hospitalist service at [facility].

## 2014-01-25 NOTE — Transfer of Care (Signed)
Immediate Anesthesia Transfer of Care Note  Patient: Erika Moreno  Procedure(s) Performed: Procedure(s) (LRB): CYSTOSCOPY WITH RETROGRADE AND RIGHT STENT PLACEMENT (Right)  Patient Location: PACU  Anesthesia Type: General  Level of Consciousness: sedated, patient cooperative and responds to stimulation  Airway & Oxygen Therapy: Patient Spontanous Breathing and Patient connected to face mask oxgen  Post-op Assessment: Report given to PACU RN and Post -op Vital signs reviewed and stable  Post vital signs: Reviewed and stable  Complications: No apparent anesthesia complications

## 2014-01-25 NOTE — Progress Notes (Signed)
Chaplain responded to consult that pt would like help with advanced directive. Pt said that she has already completed an advanced directive at [facility].  Vanetta Mulders 01/25/2014 2:08 PM

## 2014-01-25 NOTE — Progress Notes (Signed)
Pt transferred to [facility] Telemetry to 1441 per Dr. Roderic Palau. Pt's Port-a-cath saline locked and WDL.  Pt's VSS. Report given to [facility] staff and Gregary Signs, Therapist, sports at [employer]. Verbalized understanding. Pt left floor via [facility] stretcher in stable condition. Family at bedside.

## 2014-01-26 DIAGNOSIS — G894 Chronic pain syndrome: Secondary | ICD-10-CM

## 2014-01-26 MED ORDER — SODIUM CHLORIDE 0.9 % IJ SOLN
10.0000 mL | INTRAMUSCULAR | Status: DC | PRN
  Administered 2014-01-27: 30 mL
  Administered 2014-01-27: 10 mL

## 2014-01-26 MED ORDER — SIMETHICONE 80 MG PO CHEW
80.0000 mg | CHEWABLE_TABLET | Freq: Four times a day (QID) | ORAL | Status: DC | PRN
  Filled 2014-01-26: qty 1

## 2014-01-26 MED ORDER — HYDROMORPHONE HCL 1 MG/ML IJ SOLN
1.0000 mg | INTRAMUSCULAR | Status: DC | PRN
  Administered 2014-01-26 – 2014-01-27 (×3): 1 mg via INTRAVENOUS
  Filled 2014-01-26 (×2): qty 1

## 2014-01-26 MED ORDER — OXYCODONE HCL 5 MG PO TABS
15.0000 mg | ORAL_TABLET | ORAL | Status: DC | PRN
  Administered 2014-01-26 – 2014-01-27 (×3): 15 mg via ORAL
  Filled 2014-01-26 (×3): qty 3

## 2014-01-26 NOTE — Progress Notes (Signed)
[facility] PROGRESS NOTE  Erika Moreno GQB:[id number] DOB: [birth year] DOA: 01/22/2014 PCP: [name], MD  Assessment/Plan  1. Abdominal pain. Patient recently had stones in CBD and had CBD stent placed. This was reevaluated on repeat CT which showed that biliary stent was still in place. She was seen by gastroenterology who did not feel that the stent needed to be removed/adjusted. Liver function tests appear to be improved since prior hospitalization. It is felt that her pain is possibly related to progression of her underlying cancer/mass that is compressing the porta hepatis. No further intervention was recommended by gastroenterology. Oncology has also seen the patient and treatment will be supportive at this point with antiemetics and pain management.  -  Advance to regular diet today -  Resume oxycodone IR for pain -  D/c IV fentanyl and duplicate IV dilaudid orders.  IV dilaudid only for breakthrough pain 2. Hydronephrosis of the right kidney s/p ureteral stent placement on 10/23 -  Hematuria this AM, continue to monitor  3. Possible pneumonia. Patient started on Rocephin and azithromycin on admission and completed 1.5g azithro.  Continue ceftriaxone.  Breathing improving 4. Anasarca/volume overload. Possibly related to hypoalbuminemia and recent IV fluids during her previous and current hospitalization. Ultrasound of left upper extremity negative for DVT. D/C lasix and repeat BMP in AM.   5. Anemia of chronic disease, possibly related to previous chemotherapy. Improved after 2 units of PRBC. 6. 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 Appreciate oncology assistance. It appears that her malignancy has continued to progress despite receiving treatment. Further options for continued IV chemotherapy versus hospice care was discussed with the patient. After further discussion with oncology, patient wishes to continue with chemotherapy. She received first dose of new  IV chemotherapy with topotecan 10/22 7. Anxiety. Patient restarted on benzodiazepines. 8. Deconditioned, generalized weakness.  PT evaluation   Diet:  Regular Access:  port IVF:  off Proph:  lovenox  Code Status: full code Family Communication: patient alone Disposition Plan: possibly home tomorrow pending PT evaluation, tolerating diet   Consultants:  Gastroenterology  Oncology  Urology Procedures:  Ureteral stent placement on 10/23 by Dr. Dutch Gray Antibiotics:  Ceftriaxone 10/20>>  Azithromycin 10/20>>10/23     HPI/Subjective:  Abdominal and flank pain improving.  Pain medication helps with SOB.  Had BM this AM, not constipated.    Objective: Filed Vitals:   01/25/14 1815 01/25/14 2053 01/26/14 0531 01/26/14 1349  BP: 133/68 119/71 153/76 145/80  Pulse: 73 61 69 72  Temp: 97.8 F (36.6 C) 98.4 F (36.9 C) 98.5 F (36.9 C) 97.9 F (36.6 C)  TempSrc:  Oral Oral Oral  Resp: 18 16 20 18   Height:      Weight:      SpO2: 96% 92% 96% 96%    Intake/Output Summary (Last 24 hours) at 01/26/14 1801 Last data filed at 01/26/14 1751  Gross per 24 hour  Intake    890 ml  Output   2626 ml  Net  -1736 ml   Filed Weights   01/22/14 1019 01/22/14 1701  Weight: 61.236 kg (135 lb) 66 kg (145 lb 8.1 oz)    Exam:   General:  WF, No acute distress  HEENT:  NCAT, MMM  Cardiovascular:  RRR, nl S1, S2 no mrg, 2+ pulses, warm extremities  Respiratory:  CTAB, no increased WOB  Abdomen:   NABS, soft, ND, diffusely TTP without rebound or guarding  MSK:   Normal tone and bulk, LUE  and RLE with 1+ pitting edema  Neuro:  Grossly intact  Data Reviewed: Basic Metabolic Panel:  Recent Labs Lab 01/22/14 1051 01/23/14 0637 01/24/14 0911 01/25/14 0523  NA 141 138 137 135*  K 3.5* 3.7 3.8 4.1  CL 95* 95* 96 93*  CO2 35* 31 30 28   GLUCOSE 103* 102* 164* 154*  BUN 15 13 14 20   CREATININE 0.92 0.99 1.07 1.18*  CALCIUM 9.3 8.5 9.1 9.3   Liver Function  Tests:  Recent Labs Lab 01/22/14 1051 01/23/14 0637 01/24/14 0911  AST 31 23 22   ALT 24 20 18   ALKPHOS 323* 286* 277*  BILITOT 1.0 1.0 1.2  PROT 6.7 6.0 6.7  ALBUMIN 2.4* 2.3* 2.5*    Recent Labs Lab 01/22/14 1051  LIPASE 33   No results found for this basename: AMMONIA,  in the last 168 hours CBC:  Recent Labs Lab 01/22/14 1051 01/23/14 0637 01/24/14 0911 01/25/14 0523  WBC 5.2 4.6 5.1 3.6*  NEUTROABS 3.8  --  3.9  --   HGB 8.6* 7.9* 11.0* 10.5*  HCT 27.7* 25.4* 33.8* 32.0*  MCV 93.9 93.4 90.9 90.4  PLT 242 269 249 257   Cardiac Enzymes: No results found for this basename: CKTOTAL, CKMB, CKMBINDEX, TROPONINI,  in the last 168 hours BNP (last 3 results)  Recent Labs  02/10/13 1655  PROBNP 193.2*   CBG: No results found for this basename: GLUCAP,  in the last 168 hours  No results found for this or any previous visit (from the past 240 hour(s)).   Studies: No results found.  Scheduled Meds: . aspirin EC  81 mg Oral Daily  . cefTRIAXone (ROCEPHIN)  IV  1 g Intravenous Q24H  . DULoxetine  30 mg Oral Daily  . enoxaparin (LOVENOX) injection  40 mg Subcutaneous Q24H  . furosemide  20 mg Intravenous BID  . isosorbide mononitrate  60 mg Oral Daily  . ondansetron  4 mg Oral TID AC, HS, 0200  . pantoprazole  40 mg Oral BID AC  . predniSONE  5 mg Oral Q breakfast   Continuous Infusions:   Active Problems:   Cancer of unknown origin   Anemia due to chemotherapy   Nausea and vomiting   Chronic pain syndrome   Protein-calorie malnutrition, severe   Dilated cbd, acquired   Hydronephrosis of right kidney   Hydronephrosis   Peritoneal carcinomatosis   Ovarian cancer    Time spent: 30 min    Florie Carico, [place] Pager [phone]. If 7PM-7AM, please contact night-coverage at [email or web address], password [facility] 01/26/2014, 6:01 PM  LOS: 4 days

## 2014-01-27 DIAGNOSIS — N131 Hydronephrosis with ureteral stricture, not elsewhere classified: Secondary | ICD-10-CM

## 2014-01-27 LAB — BASIC METABOLIC PANEL
Anion gap: 11 (ref 5–15)
BUN: 19 mg/dL (ref 6–23)
CO2: 30 mEq/L (ref 19–32)
Calcium: 8.8 mg/dL (ref 8.4–10.5)
Chloride: 95 mEq/L — ABNORMAL LOW (ref 96–112)
Creatinine, Ser: 1.02 mg/dL (ref 0.50–1.10)
GFR calc non Af Amer: 55 mL/min — ABNORMAL LOW (ref >90)
GFR, EST AFRICAN AMERICAN: 64 mL/min — AB (ref >90)
GLUCOSE: 112 mg/dL — AB (ref 70–99)
POTASSIUM: 3.6 meq/L — AB (ref 3.7–5.3)
SODIUM: 136 meq/L — AB (ref 137–147)

## 2014-01-27 LAB — CBC
HCT: 28.9 % — ABNORMAL LOW (ref 36.0–46.0)
Hemoglobin: 9.2 g/dL — ABNORMAL LOW (ref 12.0–15.0)
MCH: 29.2 pg (ref 26.0–34.0)
MCHC: 31.8 g/dL (ref 30.0–36.0)
MCV: 91.7 fL (ref 78.0–100.0)
Platelets: 157 10*3/uL (ref 150–400)
RBC: 3.15 MIL/uL — AB (ref 3.87–5.11)
RDW: 16.9 % — ABNORMAL HIGH (ref 11.5–15.5)
WBC: 2.4 10*3/uL — AB (ref 4.0–10.5)

## 2014-01-27 MED ORDER — HEPARIN SOD (PORK) LOCK FLUSH 100 UNIT/ML IV SOLN: 500.0000 [IU] | INTRAVENOUS | Status: DC | PRN

## 2014-01-27 MED ORDER — OXYCODONE HCL 15 MG PO TABS: 15.0000 mg | ORAL_TABLET | ORAL | Status: AC | PRN

## 2014-01-27 MED ORDER — ISOSORBIDE MONONITRATE ER 30 MG PO TB24: 30.0000 mg | ORAL_TABLET | Freq: Every day | ORAL | Status: AC

## 2014-01-27 NOTE — Progress Notes (Signed)
D/C instructions reviewed with patient and family.  Both family and patient are able to verbalize understanding.  D/C home with home health. Erika Moreno

## 2014-01-27 NOTE — Progress Notes (Signed)
CARE MANAGEMENT NOTE 01/27/2014  Patient:  Erika Moreno, Erika Moreno   Account Number:  [id number]  Date Initiated:  01/23/2014  Documentation initiated by:  Theophilus Kinds  Subjective/Objective Assessment:   Pt admitted from home with SOB and abd pain. Pt lives alone and has a daughter that is currently staying with pt. Pt also has a strong family support system. Pt is independent with ADL's.     Action/Plan:   Will continue to follow for discharge planning needs. ? need for home O2 at discharge.   Anticipated DC Date:  01/26/2014   Anticipated DC Plan:  [place]  CM consult      Choice offered to / List presented to:     DME arranged  [facility] arranged  HH-1 RN  [place] agency  [facility]   Status of service:  Completed, signed off Medicare Important Message given?  YES (If response is "NO", the following Medicare IM given date fields will be blank) Date Medicare IM given:  01/27/2014 Medicare IM given by:  [facility] Date Additional Medicare IM given:   Additional Medicare IM given by:    Discharge Disposition:  [place]  Per UR Regulation:    If discussed at Long Length of Stay Meetings, dates discussed:    Comments:  01/27/2014 1100 NCM spoke to pt and gave permission to speak with dtr, Karlton Lemon or Lafe Garin. Offered choice for [facility]. Did not have preference. Requesting hospital bed. Pt has RW and 3n1 at home. Dtr lives in home with pt. Contacted [facility], [phone] agency in [place]. They will accept referral. Faxed referral to [phone], orders, facesheet and dc summary. [place], [phone] for delivery of hospital bed. Faxed orders # [phone]. Contacted dtr with info on [facility] that accepted referral. Jonnie Finner RN CCM Case Mgmt phone [phone]  01/25/14 [place], RN BSN CM Pt to transfer to [facility] for urological surgery  and services. CM on receiving unit to follow for discharge planning needs.  01/23/14 [place], RN BSN CM

## 2014-01-27 NOTE — Discharge Summary (Addendum)
Physician Discharge Summary  Erika Moreno:[id number] DOB: 1945-10-02 DOA: 01/22/2014  PCP: [name], MD  Admit date: 01/22/2014 Discharge date: 01/27/2014  Recommendations for Outpatient Follow-up:  1. Follow up with Dr. Barnet Glasgow this coming week at already scheduled appointment 2. Follow up with urology in 6-8 weeks with Dr. Jeffie Pollock.   3. Follow up with PCP in 1 week for CBC and BMP  Discharge Diagnoses:  Active Problems:   Cancer of unknown origin   Anemia due to chemotherapy   Nausea and vomiting   Chronic pain syndrome   Protein-calorie malnutrition, severe   Dilated cbd, acquired   Hydronephrosis of right kidney   Hydronephrosis   Peritoneal carcinomatosis   Ovarian cancer   Discharge Condition: Stable, improved  Diet recommendation: regular  Wt Readings from Last 3 Encounters:  01/22/14 66 kg (145 lb 8.1 oz)  01/22/14 66 kg (145 lb 8.1 oz)  01/24/14 66.769 kg (147 lb 3.2 oz)    History of present illness:   the patient is a 68 year old female with history of 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  She then hospitalized 2 times in the last couple of weeks prior to this admission. During a previous admission, she had undergone stent placement in the common bile duct with ERCP due to suspected cholangitis with common bile duct stricture and obstructive jaundice. She was incidentally found to have mild hydronephrosis and was advised to followup with urology.  She developed increasing abdominal pain with nausea and dry heaves which prompted her to return to the emergency department. Her liver function tests were normal however, the CT scan of the abdomen demonstrated worsening of her right hydronephrosis. Her common bile duct stent was in good  position and she had some inflammation versus neoplasm in the pancreatic head.  Hospital Course:   Moderate hydronephrosis and hydroureter of the right kidney likely secondary to extrinsic compression from her malignancy. She was seen by urology and underwent ureteral stent placement in 10/23 which he tolerated well. She had some hematuria post procedure which has been gradually improving.  Abdominal pain is likely a combination of peritoneal metastases, mild pancreatitis, recent instrumentation with common bile duct stent placement, and hydronephrosis status post ureteral stent placement. Her liver function tests appeared stable. She did not have evidence of urinary tract infection. Her abdominal pain and back pain improved after stent placed in her ureter. She was able to tolerate a regular diet prior to discharge and resume her home pain medications.   Possible pneumonia, however she was afebrile without leukocytosis. This was seen incidentally on imaging. She was started on Rocephin and azithromycin and completed 1.5 g of azithromycin in the hospital.  She had already been on augmentin prior to admission.  Recommend she stop her antibiotics and monitor for signs of worsening infection.    Anasarca secondary to hypoalbuminemia from severe protein calorie malnutrition. She developed some hypervolemia secondary to IV fluids. She had an ultrasound of her left upper extremity because of swelling which was negative for DVT. She was given and Lasix 20 mg IV twice a day and her swelling has gradually improved.  Resume lasix 20mg  daily.  Dietary supplements as needed.      Anemia of chronic disease and recent recent chemotherapy. She was transfused 2 units of packed red blood  cells. She should have repeat CBC done by her primary oncologist at her next appointment.  Stage IV probable ovarian cancer with peritoneal carcinomatosis. She was seen by oncology. They discussed the possibility of chemotherapy versus  hospice care. The patient elected to try chemotherapy and  she received her first dose of chemotherapy with topotecan on 10/22.    Anxiety, started on benzodiazepines  Generalized weakness, recommend home health physical therapy/occupational therapy and RN.  Consultants:  Gastroenterology  Oncology  Urology Procedures:  Ureteral stent placement on 10/23 by Dr. Dutch Gray  Antibiotics:  Ceftriaxone 10/20>>  Azithromycin 10/20>>10/23   Discharge Exam: Filed Vitals:   01/27/14 0432  BP: 140/71  Pulse: 69  Temp: 98.1 F (36.7 C)  Resp: 20   Filed Vitals:   01/26/14 0531 01/26/14 1349 01/26/14 2219 01/27/14 0432  BP: 153/76 145/80 134/60 140/71  Pulse: 69 72 66 69  Temp: 98.5 F (36.9 C) 97.9 F (36.6 C) 98.1 F (36.7 C) 98.1 F (36.7 C)  TempSrc: Oral Oral Oral Oral  Resp: 20 18 20 20   Height:      Weight:      SpO2: 96% 96% 97% 92%    General: WF, No acute distress, sleepy but arouseable and conversant HEENT: NCAT, MMM  Cardiovascular: RRR, nl S1, S2 no mrg, 2+ pulses, warm extremities  Respiratory: CTAB, no increased WOB Abdomen: NABS, soft, ND, diffusely TTP without rebound or guarding  MSK: Normal tone and bulk, LUE and BLE with 1+ pitting edema  Neuro: Grossly intact   Discharge Instructions      Discharge Instructions   Type and screen    Complete by:  Jan 23, 2014      Call MD for:  difficulty breathing, headache or visual disturbances    Complete by:  As directed      Call MD for:  extreme fatigue    Complete by:  As directed      Call MD for:  hives    Complete by:  As directed      Call MD for:  persistant dizziness or light-headedness    Complete by:  As directed      Call MD for:  persistant nausea and vomiting    Complete by:  As directed      Call MD for:  severe uncontrolled pain    Complete by:  As directed      Call MD for:  temperature >100.4    Complete by:  As directed      Complete patient signature process for consent form     Complete by:  As directed      Diet general    Complete by:  As directed      Discharge instructions    Complete by:  As directed   You had a blockage of the urine draining from your right kidney and underwent stent placement.  You may continue to experience some blood in your urine over the next few days, but it should slowly start to clear.  If you have worsening blood in your urine, please call the urology office right away.  Plavix may cause worsening blood in your urine until your stent heals some, so please do not take your plavix until your primary care doctor says it is okay.   Please make sure you use miralax or milk of magnesia to prevent constipation.     Increase activity slowly    Complete by:  As directed  Practitioner attestation of consent    Complete by:  As directed   I, the ordering practitioner, attest that I have discussed with the patient the benefits, risks, side effects, alternatives, likelihood of achieving goals and potential problems during recovery for the procedure listed.  Procedure:  Blood Product(s)            Medication List    STOP taking these medications       amoxicillin-clavulanate 875-125 MG per tablet  Commonly known as:  AUGMENTIN     clopidogrel 75 MG tablet  Commonly known as:  PLAVIX      TAKE these medications       aspirin 81 MG tablet  Take 81 mg by mouth daily.     cyclobenzaprine 10 MG tablet  Commonly known as:  FLEXERIL  Take 10 mg by mouth 3 (three) times daily as needed for muscle spasms.     DULoxetine 30 MG capsule  Commonly known as:  CYMBALTA  Take 1 capsule (30 mg total) by mouth daily.     furosemide 20 MG tablet  Commonly known as:  LASIX  Take 20 mg by mouth daily.     HAIR/SKIN/NAILS PO  Take 1 tablet by mouth daily.     hydrOXYzine 10 MG tablet  Commonly known as:  ATARAX/VISTARIL  Take 10 mg by mouth every 8 (eight) hours as needed (pruitus).     isosorbide mononitrate 30 MG 24 hr tablet  Commonly  known as:  IMDUR  Take 1 tablet (30 mg total) by mouth daily.     magnesium hydroxide 400 MG/5ML suspension  Commonly known as:  MILK OF MAGNESIA  Take 30 mLs by mouth daily as needed for mild constipation.     metoCLOPramide 5 MG tablet  Commonly known as:  REGLAN  Take 5 mg 3-4 times a day before meals and at bedtime to help with abdominal bloating.     NIGHT TIME SLEEP AID 25 MG tablet  Generic drug:  diphenhydrAMINE  Take 25 mg by mouth at bedtime as needed for sleep.     nitroGLYCERIN 0.6 MG SL tablet  Commonly known as:  NITROSTAT  Place 0.4 mg under the tongue every 5 (five) minutes as needed for chest pain.     ondansetron 8 MG tablet  Commonly known as:  ZOFRAN  Take 1 tablet by mouth every 8 hours as needed for nausea and vomiting     oxyCODONE 15 MG immediate release tablet  Commonly known as:  ROXICODONE  Take 1 tablet (15 mg total) by mouth every 4 (four) hours as needed for moderate pain or severe pain.     potassium chloride SA 20 MEQ tablet  Commonly known as:  K-DUR,KLOR-CON  Take 1 tablet (20 mEq total) by mouth 2 (two) times daily.     predniSONE 5 MG tablet  Commonly known as:  DELTASONE  Take 5 mg by mouth daily with breakfast.     prochlorperazine 10 MG tablet  Commonly known as:  COMPAZINE  Take 1 tablet (10 mg total) by mouth every 6 (six) hours as needed for nausea or vomiting.     senna-docusate 8.6-50 MG per tablet  Commonly known as:  Senokot-S  Take 3 tablets by mouth at bedtime.       Follow-up Information   Follow up with Malka So, MD. (6-8 weeks)    Specialty:  Urology   Contact information:   [place] [id number] [phone]  Follow up with Doroteo Bradford, MD. (already schedule appointment)    Specialty:  Hematology and Oncology   Contact information:   [place] [address] [phone]       Follow up with [name], MD In 1 week.   Specialty:  Family Medicine   Contact  information:   [place] [address] [phone]        The results of significant diagnostics from this hospitalization (including imaging, microbiology, ancillary and laboratory) are listed below for reference.    Significant Diagnostic Studies: Dg Chest 2 View  01/12/2014   CLINICAL DATA:  Fever.  Confusion.  Initial evaluation.  EXAM: CHEST  2 VIEW  COMPARISON:  Chest CT 8/17 2015.  FINDINGS: Power port catheter tip noted projected over upper superior vena cava. Mediastinum unremarkable. Right hilar fullness noted consistent with adenopathy. Right perihilar infiltrate noted. Subsegmental atelectasis lung bases. Prior CABG. Cardiomegaly with normal pulmonary vascularity. No pleural effusion or pneumothorax. No acute osseus abnormality.  IMPRESSION: 1. Power port catheter with tip projected over superior vena cava. 2. Right hilar fullness suggesting adenopathy. Right perihilar infiltrate noted. 3. Prior CABG.  Cardiomegaly, no CHF.   Electronically Signed   By: Marcello Moores  Register   On: 01/12/2014 02:35   Ct Abdomen Pelvis W Contrast  01/22/2014   CLINICAL DATA:  Generalized abdominal pain.  EXAM: CT ABDOMEN AND PELVIS WITH CONTRAST  TECHNIQUE: Multidetector CT imaging of the abdomen and pelvis was performed using the standard protocol following bolus administration of intravenous contrast.  CONTRAST:  28mL OMNIPAQUE IOHEXOL 300 MG/ML SOLN, 135mL OMNIPAQUE IOHEXOL 300 MG/ML SOLN  COMPARISON:  CT scan of November 19, 2013.  FINDINGS: Severe degenerative disc disease is noted at L4-5 and L5-S1. Mild bilateral pleural effusions are now noted.  Large amount of contrast is noted within the gallbladder. There has been interval placement of biliary stent through common bile duct into duodenum. Pneumobilia is noted in the left hepatic lobe. Spleen appears normal. The pancreatic head appears to be enlarged compared to prior exam, concerning for neoplasm or inflammation. Adrenal glands appear normal.  Left kidney and ureter appear normal. Moderate right hydroureteronephrosis is noted without evidence of obstructing calculus. Distal right ureter appears to be normal in caliber, concerning for stricture. Atherosclerotic calcifications of abdominal aorta are noted without aneurysmal dilatation.  There is no evidence of bowel obstruction. There is the interval development of collateral veins in the in the anterior portion of the peritoneal space suggesting portal hypertension. This appears to be due to ill-defined soft tissue density in the porta hepatis measuring 3.1 x 2.9 cm consistent with adenopathy, which is compressing the main portal vein. Mildly enlarged lymph nodes are noted in the mesenteries with the largest measuring 13 x 11 mm in the right lower quadrant, which is increased in size compared to prior exam. Mild amount of free fluid is noted in the dependent portion of the pelvis. Stable cystic mass is noted in the left pelvis measuring 6.9 x 3.6 cm. Urinary bladder is decompressed. Mild anasarca is noted.  IMPRESSION: Interval development of moderate right hydroureteronephrosis without obstructing calculus; this most likely is due to stricture involving the distal right ureter.  Mild anasarca is noted.  Interval placement of biliary stent through common bile duct into duodenum. Pancreatic head appears to be enlarged compared to prior exam, concerning for inflammation or possibly neoplasm.  Mild bilateral pleural effusions are noted.  Interval development of ill-defined soft tissue  density in the porta hepatis region concerning for adenopathy or neoplasm, which appears to be compressing the main portal vein and resulting in portal hypertension and the development of collateral veins within the peritoneal space.  Mildly enlarged mesenteric adenopathy is again noted which appears to be slightly enlarged compared to prior exam and is concerning for metastatic disease.  Stable left adnexal complex cystic  abnormality compared to prior exam.   Electronically Signed   By: Sabino Dick M.D.   On: 01/22/2014 14:15   US Renal  01/17/2014   CLINICAL DATA:  Hydronephrosis.  EXAM: RENAL/URINARY TRACT ULTRASOUND COMPLETE  COMPARISON:  MRI abdomen 01/13/2014.  FINDINGS: Right Kidney:  Length: 10.7 cm. Echogenicity within normal limits. No mass. Mild hydronephrosis. Under presence appears to improve from prior MRCP of 01/13/2014 .  Left Kidney:  Length: 11.5 cm. Echogenicity within normal limits. No mass or hydronephrosis visualized.  Bladder:  Appears normal for degree of bladder distention.  Right pleural effusion.  Mild ascites cannot be excluded.  IMPRESSION: 1. Mild right hydronephrosis. Interim improvement from prior MRCP of 01/13/2014. 2. Right pleural effusion.  Mild ascites.   Electronically Signed   By: Marcello Moores  Register   On: 01/17/2014 14:38   Mr Abdomen Mrcp Wo Cm  01/13/2014   CLINICAL DATA:  Upper abdominal pain and nausea for 1 week. Patient has a history of peritoneal carcinomatosis.  EXAM: MRI ABDOMEN WITHOUT CONTRAST  (INCLUDING MRCP)  TECHNIQUE: Multiplanar multisequence MR imaging of the abdomen was performed. Heavily T2-weighted images of the biliary and pancreatic ducts were obtained, and three-dimensional MRCP images were rendered by post processing.  COMPARISON:  CT scan 11/19/2013.  FINDINGS: Examination is quite limited due to patient motion/breathing artifact. Lack of IV contrast is also limiting.  There is new intrahepatic biliary dilatation. The common bile duct measures a maximum of 10 mm in the porta hepatis but is normal in the head of the pancreas. There is a fairly abrupt transition zone. Please see series 4 image number 21. No obvious pancreatic head mass or acute pancreatitis. No definite common bile duct stones. There is a small duodenal diverticulum near the pancreatic head but no obvious compressive mass. No ampullary lesion is identified. This could be due to peritoneal seeding  and extrinsic compression.  No focal liver lesions. The portal vein demonstrates patent flow voids. The gallbladder is distended. No definite gallstones.  The spleen is upper limits of normal in size. No focal lesions. The adrenal glands and left kidney are normal. The right kidney demonstrates new hydronephrosis. There is also upper right hydroureter. This was not present on the CT scan from 11/19/2013. I do see a small lower pole right renal calculus on the CT scan from 08/29/2013 and it is possible this is due to a obstructing ureteral calculus.  There are scattered upper abdominal lymph nodes No obvious mass or bulky adenopathy.  No significant bony findings.  IMPRESSION: New intrahepatic and common bile duct dilatation with an abrupt transition to a normal or small caliber common bile duct in the head of the pancreas. No pancreatic head mass, common bile duct stones or obvious changes of acute pancreatitis to account for this. Common bile duct tumor or extrinsic compression by peritoneal carcinomatosis is possible. ERCP may be helpful for diagnostic and therapeutic purposes.  New right-sided hydroureteronephrosis possibly due to obstructing ureteral calculus. A followup noncontrast CT scan of the abdomen/ pelvis may be helpful for further evaluation.   Electronically Signed   By: Elta Guadeloupe  Gallerani M.D.   On: 01/13/2014 17:36   Mr 3d Recon At Scanner  01/13/2014   CLINICAL DATA:  Upper abdominal pain and nausea for 1 week. Patient has a history of peritoneal carcinomatosis.  EXAM: MRI ABDOMEN WITHOUT CONTRAST  (INCLUDING MRCP)  TECHNIQUE: Multiplanar multisequence MR imaging of the abdomen was performed. Heavily T2-weighted images of the biliary and pancreatic ducts were obtained, and three-dimensional MRCP images were rendered by post processing.  COMPARISON:  CT scan 11/19/2013.  FINDINGS: Examination is quite limited due to patient motion/breathing artifact. Lack of IV contrast is also limiting.  There is  new intrahepatic biliary dilatation. The common bile duct measures a maximum of 10 mm in the porta hepatis but is normal in the head of the pancreas. There is a fairly abrupt transition zone. Please see series 4 image number 21. No obvious pancreatic head mass or acute pancreatitis. No definite common bile duct stones. There is a small duodenal diverticulum near the pancreatic head but no obvious compressive mass. No ampullary lesion is identified. This could be due to peritoneal seeding and extrinsic compression.  No focal liver lesions. The portal vein demonstrates patent flow voids. The gallbladder is distended. No definite gallstones.  The spleen is upper limits of normal in size. No focal lesions. The adrenal glands and left kidney are normal. The right kidney demonstrates new hydronephrosis. There is also upper right hydroureter. This was not present on the CT scan from 11/19/2013. I do see a small lower pole right renal calculus on the CT scan from 08/29/2013 and it is possible this is due to a obstructing ureteral calculus.  There are scattered upper abdominal lymph nodes No obvious mass or bulky adenopathy.  No significant bony findings.  IMPRESSION: New intrahepatic and common bile duct dilatation with an abrupt transition to a normal or small caliber common bile duct in the head of the pancreas. No pancreatic head mass, common bile duct stones or obvious changes of acute pancreatitis to account for this. Common bile duct tumor or extrinsic compression by peritoneal carcinomatosis is possible. ERCP may be helpful for diagnostic and therapeutic purposes.  New right-sided hydroureteronephrosis possibly due to obstructing ureteral calculus. A followup noncontrast CT scan of the abdomen/ pelvis may be helpful for further evaluation.   Electronically Signed   By: Kalman Jewels M.D.   On: 01/13/2014 17:36   US Venous Img Upper Uni Left  01/23/2014   CLINICAL DATA:  Left upper extremity edema. Recent  peripheral IV placement.  EXAM: LEFT UPPER EXTREMITY VENOUS DOPPLER ULTRASOUND  TECHNIQUE: Gray-scale sonography with graded compression, as well as color Doppler and duplex ultrasound were performed to evaluate the upper extremity deep venous system from the level of the subclavian vein and including the jugular, axillary, basilic, radial, ulnar and upper cephalic vein. Spectral Doppler was utilized to evaluate flow at rest and with distal augmentation maneuvers.  COMPARISON:  None.  FINDINGS: Internal Jugular Vein: No evidence of thrombus. Normal compressibility, respiratory phasicity and response to augmentation.  Subclavian Vein: No evidence of thrombus. Normal compressibility, respiratory phasicity and response to augmentation.  Axillary Vein: No evidence of thrombus. Normal compressibility, respiratory phasicity and response to augmentation.  Cephalic Vein: No evidence of thrombus. Normal compressibility, respiratory phasicity and response to augmentation.  Basilic Vein: No evidence of thrombus. Normal compressibility, respiratory phasicity and response to augmentation.  Brachial Veins: No evidence of thrombus. Normal compressibility, respiratory phasicity and response to augmentation.  Radial Veins: No evidence of thrombus. Normal  compressibility, respiratory phasicity and response to augmentation.  Ulnar Veins: No evidence of thrombus. Normal compressibility, respiratory phasicity and response to augmentation.  Venous Reflux:  None visualized.  Other Findings: No evidence of superficial thrombophlebitis or abnormal fluid collection.  IMPRESSION: No evidence of left upper extremity deep venous thrombosis or superficial thrombophlebitis.   Electronically Signed   By: Aletta Edouard M.D.   On: 01/23/2014 17:22   Portable Chest Xray In Am  01/16/2014   CLINICAL DATA:  Respiratory failure and status post extubation. History of cervical carcinoma.  EXAM: PORTABLE CHEST - 1 VIEW  COMPARISON:  01/15/2014   FINDINGS: The patient has been extubated. There remains a right upper lobe infiltrate which shows some decrease in density since the prior chest x-ray. Atelectasis at the right lung base also improved. No overt edema or pleural fluid. The heart size is stable and mildly enlarged. Stable appearance of Port-A-Cath.  IMPRESSION: Improved aeration of the right lung with some decrease in density of a right upper lobe infiltrate.   Electronically Signed   By: Aletta Edouard M.D.   On: 01/16/2014 16:10   Portable Chest Xray  01/15/2014   CLINICAL DATA:  Endotracheal tube placement.  Pulmonary edema.  EXAM: PORTABLE CHEST - 1 VIEW 3:51 p.m.  COMPARISON:  Chest x-rays dated 01/15/2014 at 2:31 p.m. and 01/11/2014  FINDINGS: Endotracheal tube has been inserted and is in good position. Power port in place. Bilateral pulmonary edema has improved. There is persistent increased density at the left lung base which could represent atelectasis or infiltrate.  There is a persistent right perihilar infiltrate.  IMPRESSION: ET tube in good position. Improved pulmonary edema. Persistent right perihilar infiltrate and left base infiltrate or atelectasis.   Electronically Signed   By: Rozetta Nunnery M.D.   On: 01/15/2014 16:14   Dg Chest Port 1 View  01/15/2014   CLINICAL DATA:  Respiratory distress, acute  EXAM: PORTABLE CHEST - 1 VIEW  COMPARISON:  January 11, 2014  FINDINGS: There is now diffuse interstitial edema. Heart is enlarged with mild pulmonary venous hypertension. There is a questionable mass in the right upper lobe medially measuring approximately 3.0 x 2.5 cm. There is no convincing adenopathy. Patient is status post coronary artery bypass grafting. Port-A-Cath tip is in the superior cava. No pneumothorax.  IMPRESSION: Congestive heart failure, new.  Suspect mass right upper lobe medially. Advise noncontrast enhanced chest CT to further evaluate.  These results will be called to the ordering clinician or representative  by the Radiologist Assistant, and communication documented in the PACS or zVision Dashboard.   Electronically Signed   By: Lowella Grip M.D.   On: 01/15/2014 14:45   Dg Chest Port 1v Same Day  01/15/2014   CLINICAL DATA:  Respiratory failure following ERCP, he ETT adjustment  EXAM: PORTABLE CHEST - 1 VIEW SAME DAY  COMPARISON:  01/15/2014 at 1551 hr  FINDINGS: Endotracheal tube terminates 3 cm above the carina.  Right upper lobe opacity, suspicious for pneumonia. No pleural effusion or pneumothorax.  Cardiomegaly.  Postsurgical changes related to prior CABG.  Prominent right perihilar opacity, underlying adenopathy not excluded.  Right chest power port terminating in the mid SVC.  IMPRESSION: Endotracheal tube terminates 3 cm above the carina.  Right upper lobe opacity, suspicious for pneumonia.   Electronically Signed   By: Julian Hy M.D.   On: 01/15/2014 18:53   Dg Ercp With Sphincterotomy  01/15/2014   CLINICAL DATA:  History of upper abdominal  pain and nausea. History of peritoneal carcinomatosis.  EXAM: ERCP  TECHNIQUE: Multiple spot images obtained with the fluoroscopic device and submitted for interpretation post-procedure.  COMPARISON:  MRI 01/13/2014  FINDINGS: Contrast injections demonstrate dilatation of the intrahepatic biliary system and common hepatic duct. Contrast drains into the gallbladder. There is a long segment narrowing of the common bile duct. Wire was advanced into the intrahepatic biliary system. The common bile duct was dilated with a balloon. Evidence for brush biopsy. A metallic stent was placed in the common hepatic duct and common bile duct.  IMPRESSION: Narrowing of the common bile duct with dilatation of the common hepatic duct and intrahepatic ducts.  Placement of metallic biliary stent.  These images were submitted for radiologic interpretation only. Please see the procedural report for the amount of contrast and the fluoroscopy time utilized.   Electronically  Signed   By: Markus Daft M.D.   On: 01/15/2014 14:18   Dg Abd Acute W/chest  01/22/2014   CLINICAL DATA:  Acute chest and abdominal pain. Shortness of breath.  EXAM: ACUTE ABDOMEN SERIES (ABDOMEN 2 VIEW & CHEST 1 VIEW)  COMPARISON:  Chest radiograph January 16, 2014.  FINDINGS: Biliary stent is noted. No abnormal bowel dilatation is noted. Moderate stool burden is noted. No pneumoperitoneum is noted. Stable cardiomediastinal silhouette. Sternotomy wires are noted. Right subclavian Port-A-Cath is again noted with distal tip in expected position of right innominate vein. Minimal right basilar opacity is noted most consistent with subsegmental atelectasis. Minimal bilateral pleural effusions are noted.  IMPRESSION: Minimal bilateral pleural effusions. Minimal right basilar subsegmental atelectasis.  No evidence of bowel obstruction or ileus.   Electronically Signed   By: Sabino Dick M.D.   On: 01/22/2014 12:29   US Abdomen Limited Ruq  01/11/2014   CLINICAL DATA:  Postprandial right upper quadrant abdominal pain over the past 2 weeks. Elevated liver function tests. Current history of ovarian cancer. Prior history of breast cancer.  EXAM: US ABDOMEN LIMITED - RIGHT UPPER QUADRANT  COMPARISON:  CTA abdomen 11/19/2013, 06/12/2013.  FINDINGS: Gallbladder:  Markedly dilated, measuring approximately 13.9 x 4.1 x 4.3 cm. Large amount of layering echogenic sludge. No shadowing gallstones. No gallbladder wall thickening or pericholecystic fluid. Negative sonographic Murphy sign according to the ultrasound technologist.  Common bile duct:  Diameter: Approximately 14 mm.  Echogenic sludge within the common bile duct. No visible gallstones.  Liver:  Normal size and echotexture without focal parenchymal abnormality. Patent portal vein with hepatopetal flow. Minimal ascites adjacent to the liver.  IMPRESSION: 1. Dilated gallbladder containing a large amount of echogenic sludge. No evidence of cholelithiasis or acute  cholecystitis. 2. Dilated common bile duct up to 14 mm, containing echogenic sludge. No visible common bile duct stones. 3. Minimal perihepatic ascites.   Electronically Signed   By: Evangeline Dakin M.D.   On: 01/11/2014 17:55    Microbiology: No results found for this or any previous visit (from the past 240 hour(s)).   Labs: Basic Metabolic Panel:  Recent Labs Lab 01/22/14 1051 01/23/14 0637 01/24/14 0911 01/25/14 0523 01/27/14 0436  NA 141 138 137 135* 136*  K 3.5* 3.7 3.8 4.1 3.6*  CL 95* 95* 96 93* 95*  CO2 35* 31 30 28 30   GLUCOSE 103* 102* 164* 154* 112*  BUN 15 13 14 20 19   CREATININE 0.92 0.99 1.07 1.18* 1.02  CALCIUM 9.3 8.5 9.1 9.3 8.8   Liver Function Tests:  Recent Labs Lab 01/22/14 1051 01/23/14 0637 01/24/14  0911  AST 31 23 22   ALT 24 20 18   ALKPHOS 323* 286* 277*  BILITOT 1.0 1.0 1.2  PROT 6.7 6.0 6.7  ALBUMIN 2.4* 2.3* 2.5*    Recent Labs Lab 01/22/14 1051  LIPASE 33   No results found for this basename: AMMONIA,  in the last 168 hours CBC:  Recent Labs Lab 01/22/14 1051 01/23/14 0637 01/24/14 0911 01/25/14 0523 01/27/14 0436  WBC 5.2 4.6 5.1 3.6* 2.4*  NEUTROABS 3.8  --  3.9  --   --   HGB 8.6* 7.9* 11.0* 10.5* 9.2*  HCT 27.7* 25.4* 33.8* 32.0* 28.9*  MCV 93.9 93.4 90.9 90.4 91.7  PLT 242 269 249 257 157   Cardiac Enzymes: No results found for this basename: CKTOTAL, CKMB, CKMBINDEX, TROPONINI,  in the last 168 hours BNP: BNP (last 3 results)  Recent Labs  02/10/13 1655  PROBNP 193.2*   CBG: No results found for this basename: GLUCAP,  in the last 168 hours  Time coordinating discharge: 35 minutes  Signed:  Kendra Woolford  [facility] 01/27/2014, 10:19 AM

## 2014-01-28 ENCOUNTER — Encounter (HOSPITAL_COMMUNITY): Payer: Self-pay | Admitting: Urology

## 2014-01-31 ENCOUNTER — Encounter (HOSPITAL_COMMUNITY): Payer: PRIVATE HEALTH INSURANCE | Attending: Internal Medicine

## 2014-01-31 ENCOUNTER — Telehealth (HOSPITAL_COMMUNITY): Payer: Self-pay

## 2014-01-31 ENCOUNTER — Inpatient Hospital Stay (HOSPITAL_COMMUNITY): Payer: PRIVATE HEALTH INSURANCE

## 2014-01-31 ENCOUNTER — Encounter (HOSPITAL_COMMUNITY): Payer: Self-pay

## 2014-01-31 VITALS — BP 132/60 | HR 69 | Temp 98.0°F | Resp 18 | Wt 140.4 lb

## 2014-01-31 DIAGNOSIS — T451X5A Adverse effect of antineoplastic and immunosuppressive drugs, initial encounter: Secondary | ICD-10-CM | POA: Diagnosis not present

## 2014-01-31 DIAGNOSIS — C778 Secondary and unspecified malignant neoplasm of lymph nodes of multiple regions: Secondary | ICD-10-CM

## 2014-01-31 DIAGNOSIS — C482 Malignant neoplasm of peritoneum, unspecified: Secondary | ICD-10-CM

## 2014-01-31 DIAGNOSIS — D6481 Anemia due to antineoplastic chemotherapy: Secondary | ICD-10-CM | POA: Insufficient documentation

## 2014-01-31 DIAGNOSIS — C801 Malignant (primary) neoplasm, unspecified: Secondary | ICD-10-CM | POA: Diagnosis present

## 2014-01-31 DIAGNOSIS — Z5111 Encounter for antineoplastic chemotherapy: Secondary | ICD-10-CM

## 2014-01-31 LAB — CBC WITH DIFFERENTIAL/PLATELET
BASOS PCT: 1 % (ref 0–1)
Basophils Absolute: 0 10*3/uL (ref 0.0–0.1)
Eosinophils Absolute: 0.1 10*3/uL (ref 0.0–0.7)
Eosinophils Relative: 3 % (ref 0–5)
HEMATOCRIT: 26.2 % — AB (ref 36.0–46.0)
HEMOGLOBIN: 8.3 g/dL — AB (ref 12.0–15.0)
LYMPHS ABS: 0.6 10*3/uL — AB (ref 0.7–4.0)
LYMPHS PCT: 27 % (ref 12–46)
MCH: 28.9 pg (ref 26.0–34.0)
MCHC: 31.7 g/dL (ref 30.0–36.0)
MCV: 91.3 fL (ref 78.0–100.0)
MONO ABS: 0.2 10*3/uL (ref 0.1–1.0)
MONOS PCT: 8 % (ref 3–12)
NEUTROS ABS: 1.3 10*3/uL — AB (ref 1.7–7.7)
Neutrophils Relative %: 62 % (ref 43–77)
Platelets: 95 10*3/uL — ABNORMAL LOW (ref 150–400)
RBC: 2.87 MIL/uL — ABNORMAL LOW (ref 3.87–5.11)
RDW: 16.4 % — AB (ref 11.5–15.5)
WBC: 2.2 10*3/uL — AB (ref 4.0–10.5)

## 2014-01-31 MED ORDER — SODIUM CHLORIDE 0.9 % IJ SOLN: 10.0000 mL | INTRAMUSCULAR | Status: DC | PRN

## 2014-01-31 MED ORDER — SODIUM CHLORIDE 0.9 % IV SOLN
Freq: Once | INTRAVENOUS | Status: AC
  Administered 2014-01-31: 10:00:00 via INTRAVENOUS

## 2014-01-31 MED ORDER — HEPARIN SOD (PORK) LOCK FLUSH 100 UNIT/ML IV SOLN
500.0000 [IU] | Freq: Once | INTRAVENOUS | Status: AC | PRN
  Administered 2014-01-31: 500 [IU]
  Filled 2014-01-31: qty 5

## 2014-01-31 MED ORDER — SODIUM CHLORIDE 0.9 % IV SOLN
Freq: Once | INTRAVENOUS | Status: AC
  Administered 2014-01-31: 8 mg via INTRAVENOUS
  Filled 2014-01-31: qty 4

## 2014-01-31 MED ORDER — DARBEPOETIN ALFA-POLYSORBATE 300 MCG/0.6ML IJ SOLN
INTRAMUSCULAR | Status: AC
  Filled 2014-01-31: qty 0.6

## 2014-01-31 MED ORDER — TOPOTECAN HCL CHEMO INJECTION 4 MG
2.0000 mg/m2 | Freq: Once | INTRAVENOUS | Status: AC
  Administered 2014-01-31: 3.4 mg via INTRAVENOUS
  Filled 2014-01-31: qty 3.4

## 2014-01-31 MED ORDER — DEXAMETHASONE SODIUM PHOSPHATE 10 MG/ML IJ SOLN: 10.0000 mg | Freq: Once | INTRAMUSCULAR | Status: DC

## 2014-01-31 MED ORDER — SODIUM CHLORIDE 0.9 % IV SOLN: 8.0000 mg | Freq: Once | INTRAVENOUS | Status: DC

## 2014-01-31 MED ORDER — DARBEPOETIN ALFA-POLYSORBATE 300 MCG/0.6ML IJ SOLN
300.0000 ug | Freq: Once | INTRAMUSCULAR | Status: AC
Start: 2014-01-31 — End: 2014-01-31
  Administered 2014-01-31: 300 ug via SUBCUTANEOUS

## 2014-01-31 NOTE — Patient Instructions (Signed)
[facility] Discharge Instructions for Patients Receiving Chemotherapy  Today you received the following chemotherapy agents topotecan  To help prevent nausea and vomiting after your treatment, we encourage you to take your nausea medication}    If you develop nausea and vomiting that is not controlled by your nausea medication, call the clinic. If it is after clinic hours your family physician or the after hours number for the clinic or go to the Emergency Department.   BELOW ARE SYMPTOMS THAT SHOULD BE REPORTED IMMEDIATELY:  *FEVER GREATER THAN 101.0 F  *CHILLS WITH OR WITHOUT FEVER  NAUSEA AND VOMITING THAT IS NOT CONTROLLED WITH YOUR NAUSEA MEDICATION  *UNUSUAL SHORTNESS OF BREATH  *UNUSUAL BRUISING OR BLEEDING  TENDERNESS IN MOUTH AND THROAT WITH OR WITHOUT PRESENCE OF ULCERS  *URINARY PROBLEMS  *BOWEL PROBLEMS  UNUSUAL RASH Items with * indicate a potential emergency and should be followed up as soon as possible.  One of the nurses will contact you 24 hours after your treatment. Please let the nurse know about any problems that you may have experienced. Feel free to call the clinic you have any questions or concerns. The clinic phone number is [phone].   I have been informed and understand all the instructions given to me. I know to contact the clinic, my physician, or go to the Emergency Department if any problems should occur. I do not have any questions at this time, but understand that I may call the clinic during office hours or the Patient Navigator at [phone] should I have any questions or need assistance in obtaining follow up care.    Topotecan injection What is this medicine? TOPOTECAN (TOE poe TEE kan) is a chemotherapy drug. It is used to treat lung cancer, ovarian cancer, and cervical cancer. This medicine may be used for other purposes; ask your health care provider or pharmacist if you have questions. COMMON BRAND NAME(S):  Hycamtin What should I tell my health care provider before I take this medicine? They need to know if you have any of these conditions: -blood disorders -dehydration -diarrhea -immune system problems -infection (especially a virus infection such as chickenpox, cold sores, or herpes) -kidney disease -low blood counts, like low white cell, platelet, or red cell counts -recent or ongoing radiation therapy -an unusual or allergic reaction to topotecan, other medicines, foods, dyes, or preservatives -pregnant or trying to get pregnant -breast-feeding How should I use this medicine? This medicine is for infusion into a vein. It is usually given by a health care professional in a hospital or clinic setting. In rare cases, you might get this medicine at home. You will be taught how to give this medicine. Use exactly as directed. Take your medicine at regular intervals. Do not take your medicine more often than directed. It is important that you put your used needles and syringes in a special sharps container. Do not put them in a trash can. If you do not have a sharps container, call your pharmacist or healthcare provider to get one. Talk to your pediatrician regarding the use of this medicine in children. Special care may be needed. Overdosage: If you think you have taken too much of this medicine contact a poison control center or emergency room at once. NOTE: This medicine is only for you. Do not share this medicine with others. What if I miss a dose? It is important not to miss your dose. Call your doctor or health care professional if you are  unable to keep an appointment. What may interact with this medicine? -amiodarone -antiviral medicines for HIV or AIDS -cisplatin -clarithromycin -cyclosporine -diltiazem -erythromycin -grapefruit or grapefruit juice -medicines for fungal infections like ketoconazole and itraconazole -mefloquine -mifepristone,  RU-486 -nicardipine -phenytoin -propafenone -quinidine -tacrolimus -tamoxifen -testosterone -vaccines -verapamil Talk to your prescriber or health care professional before taking any of these medicines: -aspirin -acetaminophen -ibuprofen -naproxen -ketoprofen This list may not describe all possible interactions. Give your health care provider a list of all the medicines, herbs, non-prescription drugs, or dietary supplements you use. Also tell them if you smoke, drink alcohol, or use illegal drugs. Some items may interact with your medicine. What should I watch for while using this medicine? This drug may make you feel generally unwell. This is not uncommon, as chemotherapy can affect healthy cells as well as cancer cells. Report any side effects. Continue your course of treatment even though you feel ill unless your doctor tells you to stop. Call your doctor or health care professional for advice if you get a fever, chills or sore throat, or other symptoms of a cold or flu. Do not treat yourself. This drug decreases your body's ability to fight infections. Try to avoid being around people who are sick. This medicine may increase your risk to bruise or bleed. Call your doctor or health care professional if you notice any unusual bleeding. Be careful brushing and flossing your teeth or using a toothpick because you may get an infection or bleed more easily. If you have any dental work done, tell your dentist you are receiving this medicine. Avoid taking products that contain aspirin, acetaminophen, ibuprofen, naproxen, or ketoprofen unless instructed by your doctor. These medicines may hide a fever. Do not become pregnant while taking this medicine. Women should inform their doctor if they wish to become pregnant or think they might be pregnant. There is a potential for serious side effects to an unborn child. Talk to your health care professional or pharmacist for more information. Do not  breast-feed an infant while taking this medicine. What side effects may I notice from receiving this medicine? Side effects that you should report to your doctor or health care professional as soon as possible: -allergic reactions like skin rash, itching or hives, swelling of the face, lips, or tongue -breathing difficulties -diarrhea -dizziness -fever or chills, sore throat -mouth sores or pain -pain, tingling, numbness in the hands or feet -unusual bleeding or bruising -unusually weak or tired -yellowing of the eyes or skin Side effects that usually do not require medical attention (report to your doctor or health care professional if they continue or are bothersome): -hair loss -headache -loss of appetite -nausea, vomiting -stomach pain This list may not describe all possible side effects. Call your doctor for medical advice about side effects. You may report side effects to FDA at 1-800-FDA-1088. Where should I keep my medicine? Keep out of the reach of children. This drug is usually given in a hospital or clinic and will not be stored at home. In rare cases, this medicine may be given at home. If you are using this medicine at home, you will be instructed on how to store this medicine. Throw away any unused medicine after the expiration date on the label. NOTE: This sheet is a summary. It may not cover all possible information. If you have questions about this medicine, talk to your doctor, pharmacist, or health care provider.  2015, [employer]. (2007-12-06 17:25:53)
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
## 2014-01-31 NOTE — Progress Notes (Signed)
[place] [facility] Tolerated chemotherapy well today.  Discharged home in wheelchair.  Also received aranesp 300 mcg

## 2014-02-01 NOTE — Telephone Encounter (Signed)
Error

## 2014-02-04 ENCOUNTER — Other Ambulatory Visit (HOSPITAL_COMMUNITY): Payer: Self-pay | Admitting: Oncology

## 2014-02-06 ENCOUNTER — Telehealth (HOSPITAL_COMMUNITY): Payer: Self-pay

## 2014-02-06 NOTE — Telephone Encounter (Signed)
Call from daughter wanting to cancel all appointments for now.  Patient with Hospice now and will call if/when she needs a follow-up appointment.

## 2014-02-07 ENCOUNTER — Ambulatory Visit (HOSPITAL_COMMUNITY): Payer: PRIVATE HEALTH INSURANCE

## 2014-02-07 ENCOUNTER — Inpatient Hospital Stay (HOSPITAL_COMMUNITY): Payer: PRIVATE HEALTH INSURANCE

## 2014-02-14 ENCOUNTER — Other Ambulatory Visit (HOSPITAL_COMMUNITY): Payer: PRIVATE HEALTH INSURANCE

## 2014-02-14 ENCOUNTER — Ambulatory Visit (HOSPITAL_COMMUNITY): Payer: PRIVATE HEALTH INSURANCE

## 2014-02-19 ENCOUNTER — Encounter: Payer: Self-pay | Admitting: Hematology and Oncology

## 2014-02-20 ENCOUNTER — Inpatient Hospital Stay (HOSPITAL_COMMUNITY): Payer: PRIVATE HEALTH INSURANCE

## 2014-02-25 ENCOUNTER — Ambulatory Visit (HOSPITAL_COMMUNITY): Payer: PRIVATE HEALTH INSURANCE

## 2014-02-25 ENCOUNTER — Inpatient Hospital Stay (HOSPITAL_COMMUNITY): Payer: PRIVATE HEALTH INSURANCE

## 2014-02-27 ENCOUNTER — Inpatient Hospital Stay (HOSPITAL_COMMUNITY): Payer: PRIVATE HEALTH INSURANCE

## 2014-03-04 ENCOUNTER — Inpatient Hospital Stay (HOSPITAL_COMMUNITY): Payer: PRIVATE HEALTH INSURANCE

## 2014-03-06 ENCOUNTER — Inpatient Hospital Stay (HOSPITAL_COMMUNITY): Payer: PRIVATE HEALTH INSURANCE

## 2014-03-11 ENCOUNTER — Inpatient Hospital Stay (HOSPITAL_COMMUNITY): Payer: PRIVATE HEALTH INSURANCE

## 2014-03-20 ENCOUNTER — Inpatient Hospital Stay (HOSPITAL_COMMUNITY): Payer: PRIVATE HEALTH INSURANCE

## 2014-03-25 ENCOUNTER — Ambulatory Visit (HOSPITAL_COMMUNITY): Payer: PRIVATE HEALTH INSURANCE | Admitting: Oncology

## 2014-03-25 ENCOUNTER — Inpatient Hospital Stay (HOSPITAL_COMMUNITY): Payer: PRIVATE HEALTH INSURANCE

## 2014-03-27 ENCOUNTER — Inpatient Hospital Stay (HOSPITAL_COMMUNITY): Payer: PRIVATE HEALTH INSURANCE

## 2014-04-01 ENCOUNTER — Inpatient Hospital Stay (HOSPITAL_COMMUNITY): Payer: PRIVATE HEALTH INSURANCE

## 2014-04-03 ENCOUNTER — Inpatient Hospital Stay (HOSPITAL_COMMUNITY): Payer: PRIVATE HEALTH INSURANCE

## 2014-04-05 DEATH — deceased

## 2014-04-08 ENCOUNTER — Inpatient Hospital Stay (HOSPITAL_COMMUNITY): Payer: PRIVATE HEALTH INSURANCE

## 2014-04-18 ENCOUNTER — Encounter (HOSPITAL_COMMUNITY): Payer: Self-pay | Admitting: Internal Medicine

## 2015-01-26 IMAGING — CT CT HEAD W/O CM
1 series · 16 of 30 positions shown, 20 images · non-contrast
Comparison: None.

CLINICAL DATA: Fatigue, history of breast cancer

EXAM:
CT HEAD WITHOUT CONTRAST
TECHNIQUE: Contiguous axial images were obtained from the base of the skull
through the vertex without intravenous contrast.

[Series 2: headseq 4.8 h37s · axial · 0.47mm/px · z∈[+81,+216]mm · 16 of 30 slices shown, 20 images]
[im 2/30  brain]
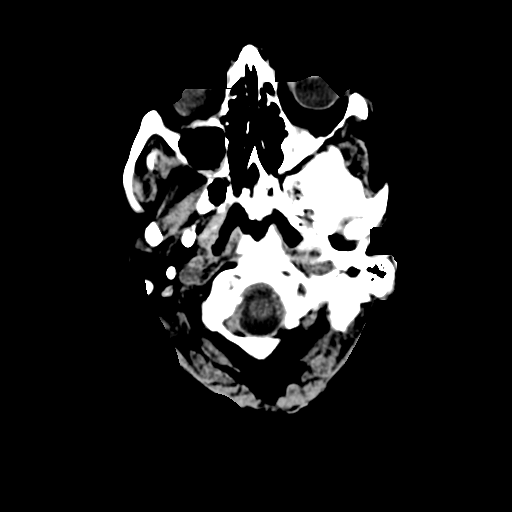
[im 2/30  bone]
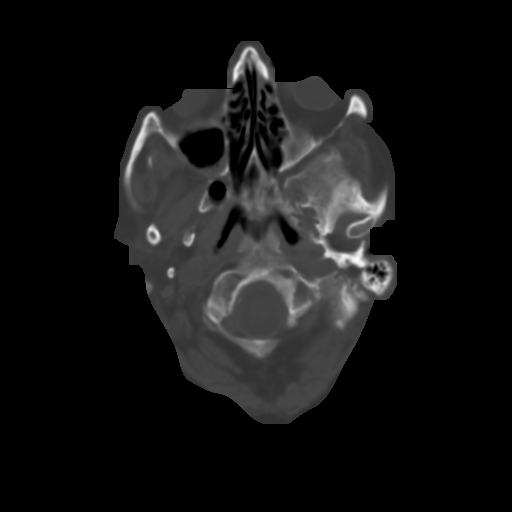
[im 4/30  brain]
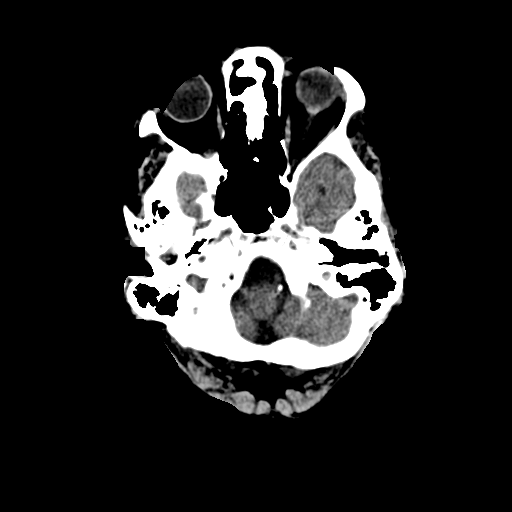
[im 6/30  brain]
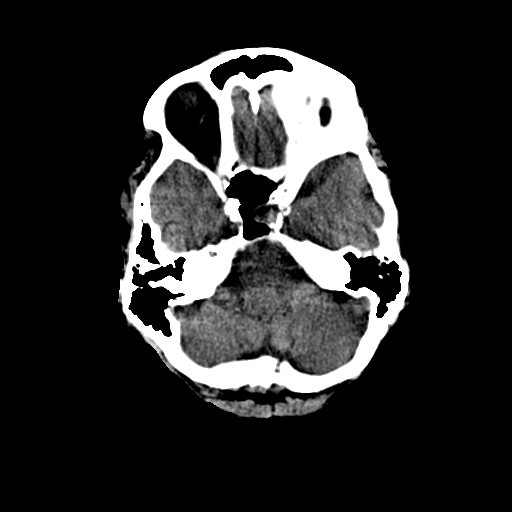
[im 8/30  brain]
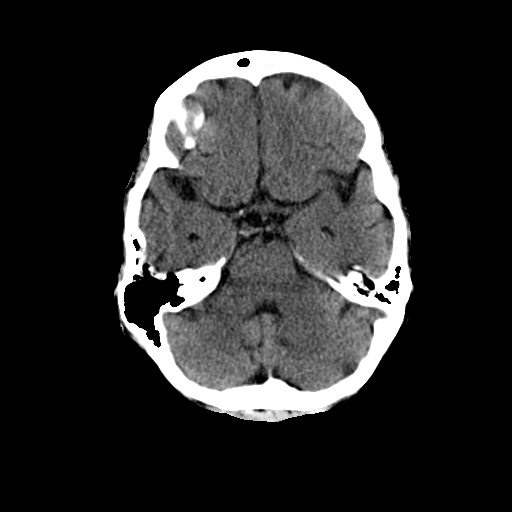
[im 9/30  brain]
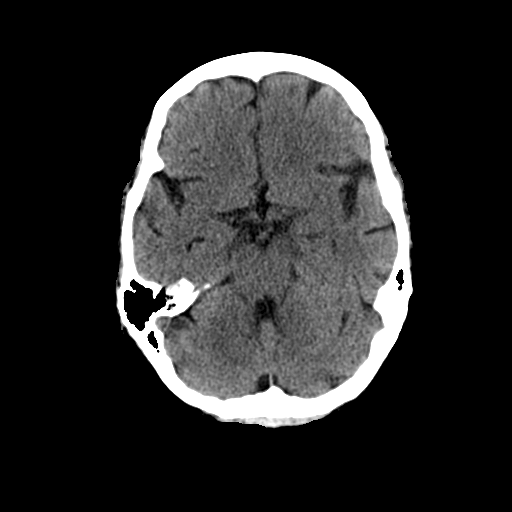
[im 9/30  bone]
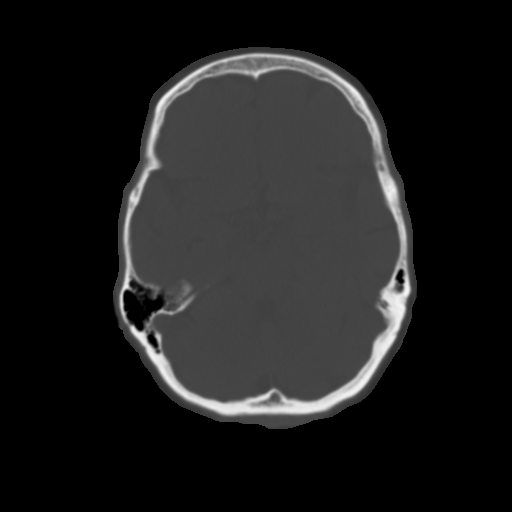
[im 11/30  brain]
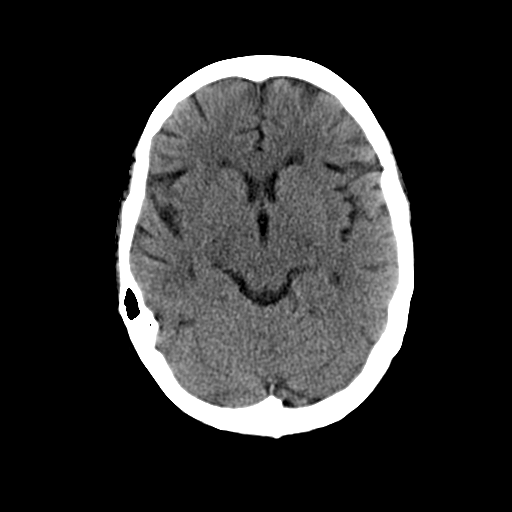
[im 13/30  brain]
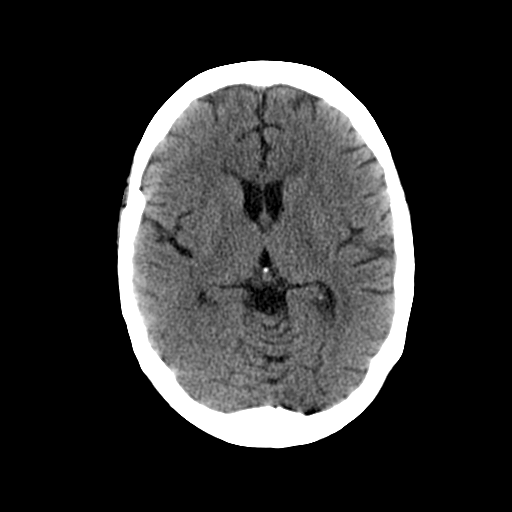
[im 15/30  brain]
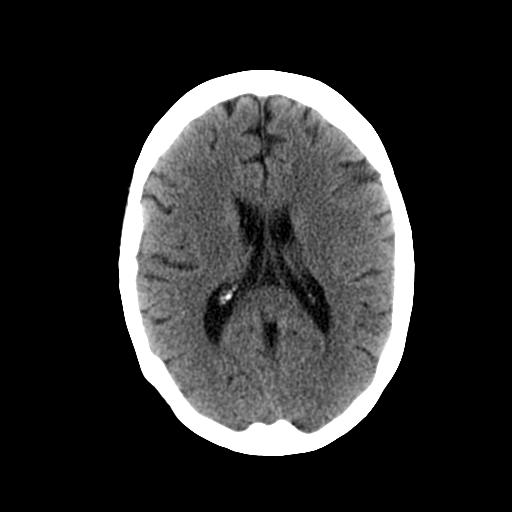
[im 16/30  brain]
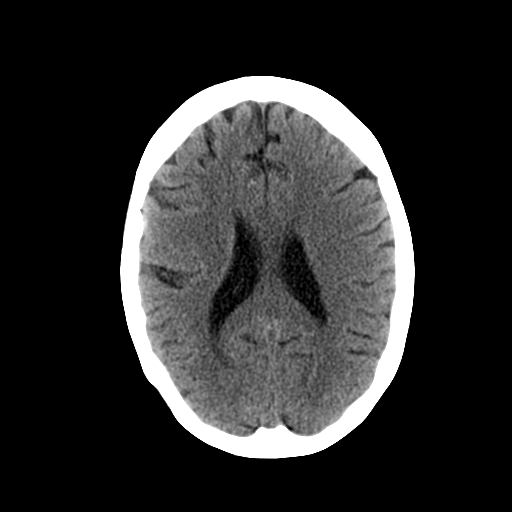
[im 16/30  bone]
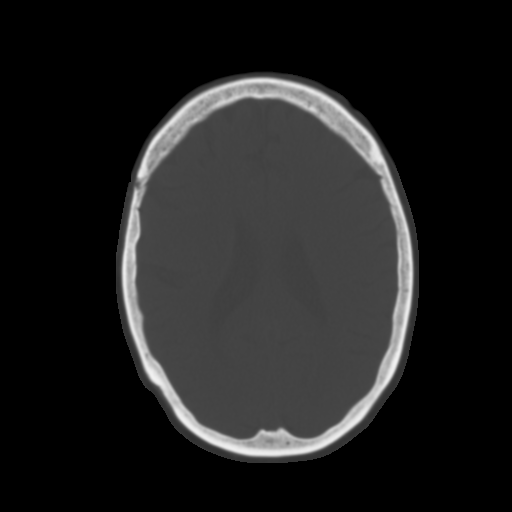
[im 18/30  brain]
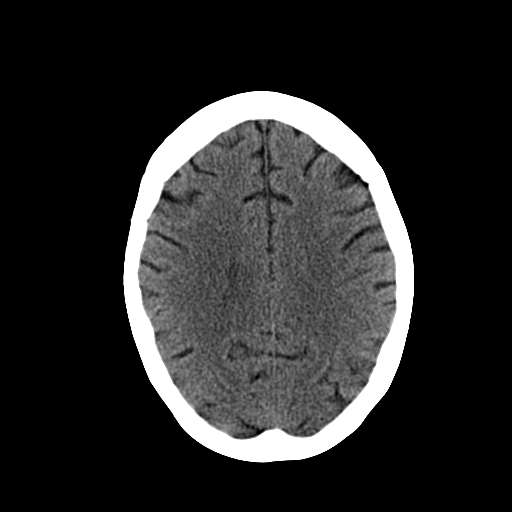
[im 20/30  brain]
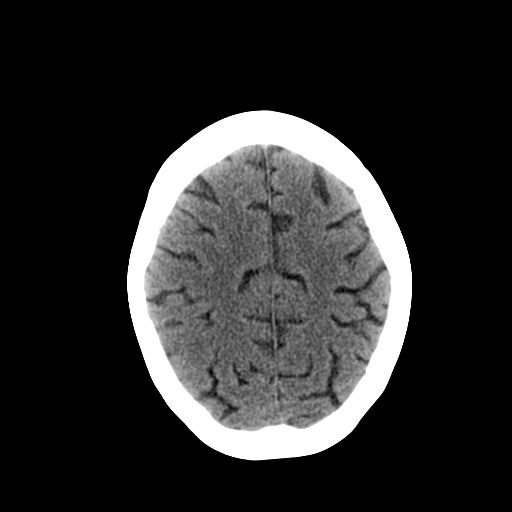
[im 22/30  brain]
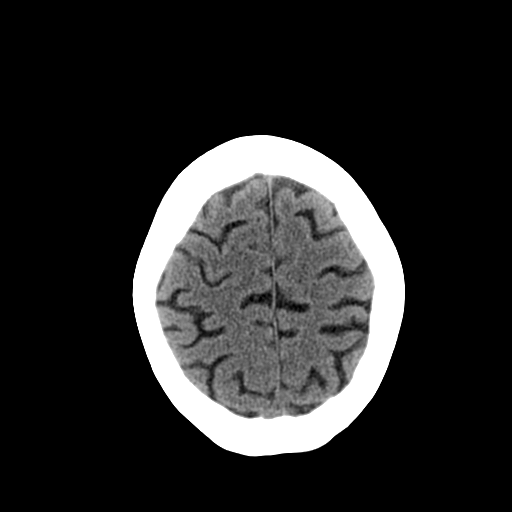
[im 23/30  brain]
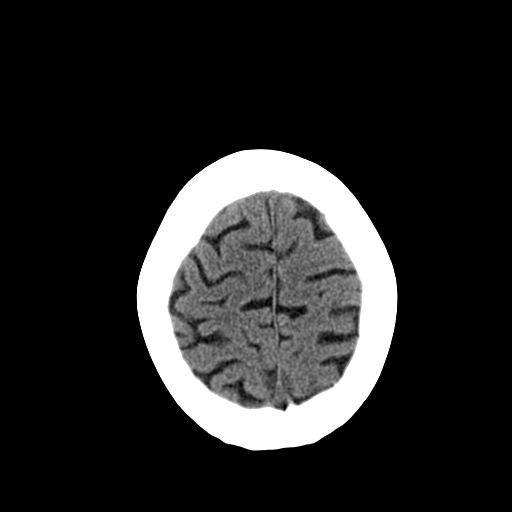
[im 23/30  bone]
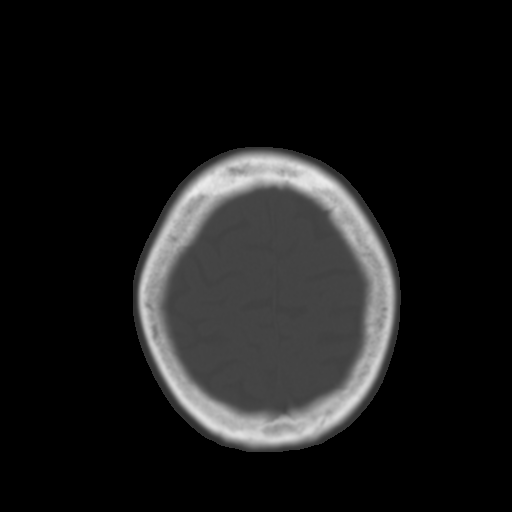
[im 25/30  brain]
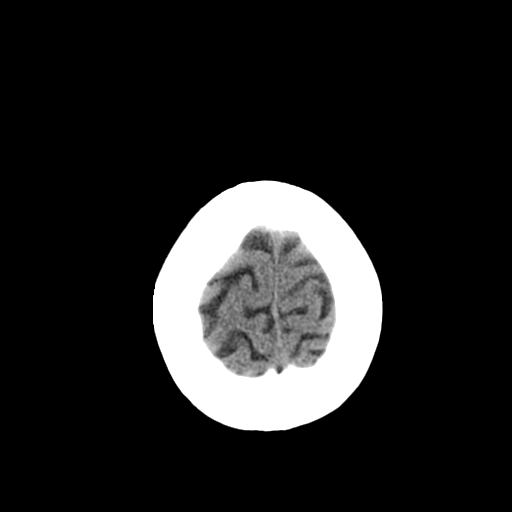
[im 27/30  brain]
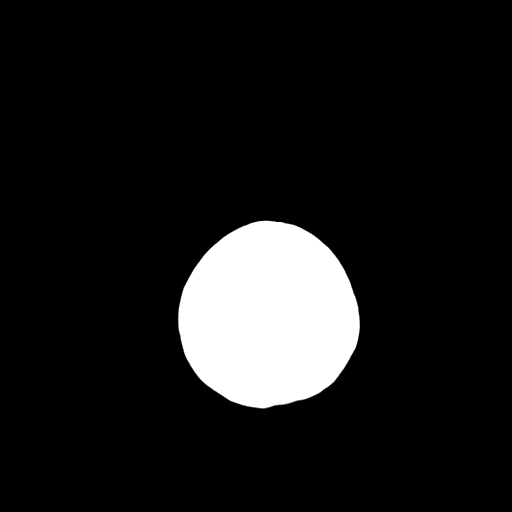
[im 29/30  brain]
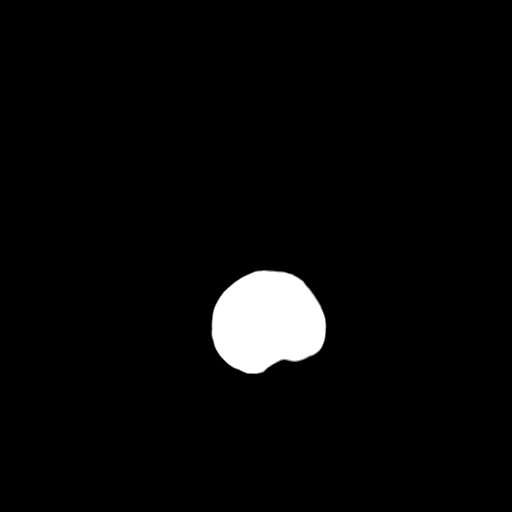

[16 of 30 positions shown; findings below may reference images not displayed]

FINDINGS: At least partial opacification left maxillary sinus. Chronic
appearing inflammatory change involving the right maxillary sinus
also identified. There is no evidence of hemorrhage or extra-axial
fluid. No abnormal attenuation is seen to suggest infarct. There is
no hydrocephalus. No mass effect.
IMPRESSION: Sinusitis. No acute findings.

## 2015-12-31 IMAGING — CR DG CHEST 1V PORT SAME DAY
1 series · 1 of 1 positions shown · non-contrast
Comparison: 01/15/2014 at 0660 hr

CLINICAL DATA: Respiratory failure following ERCP, he ETT
adjustment

EXAM:
PORTABLE CHEST - 1 VIEW SAME DAY

[ap]
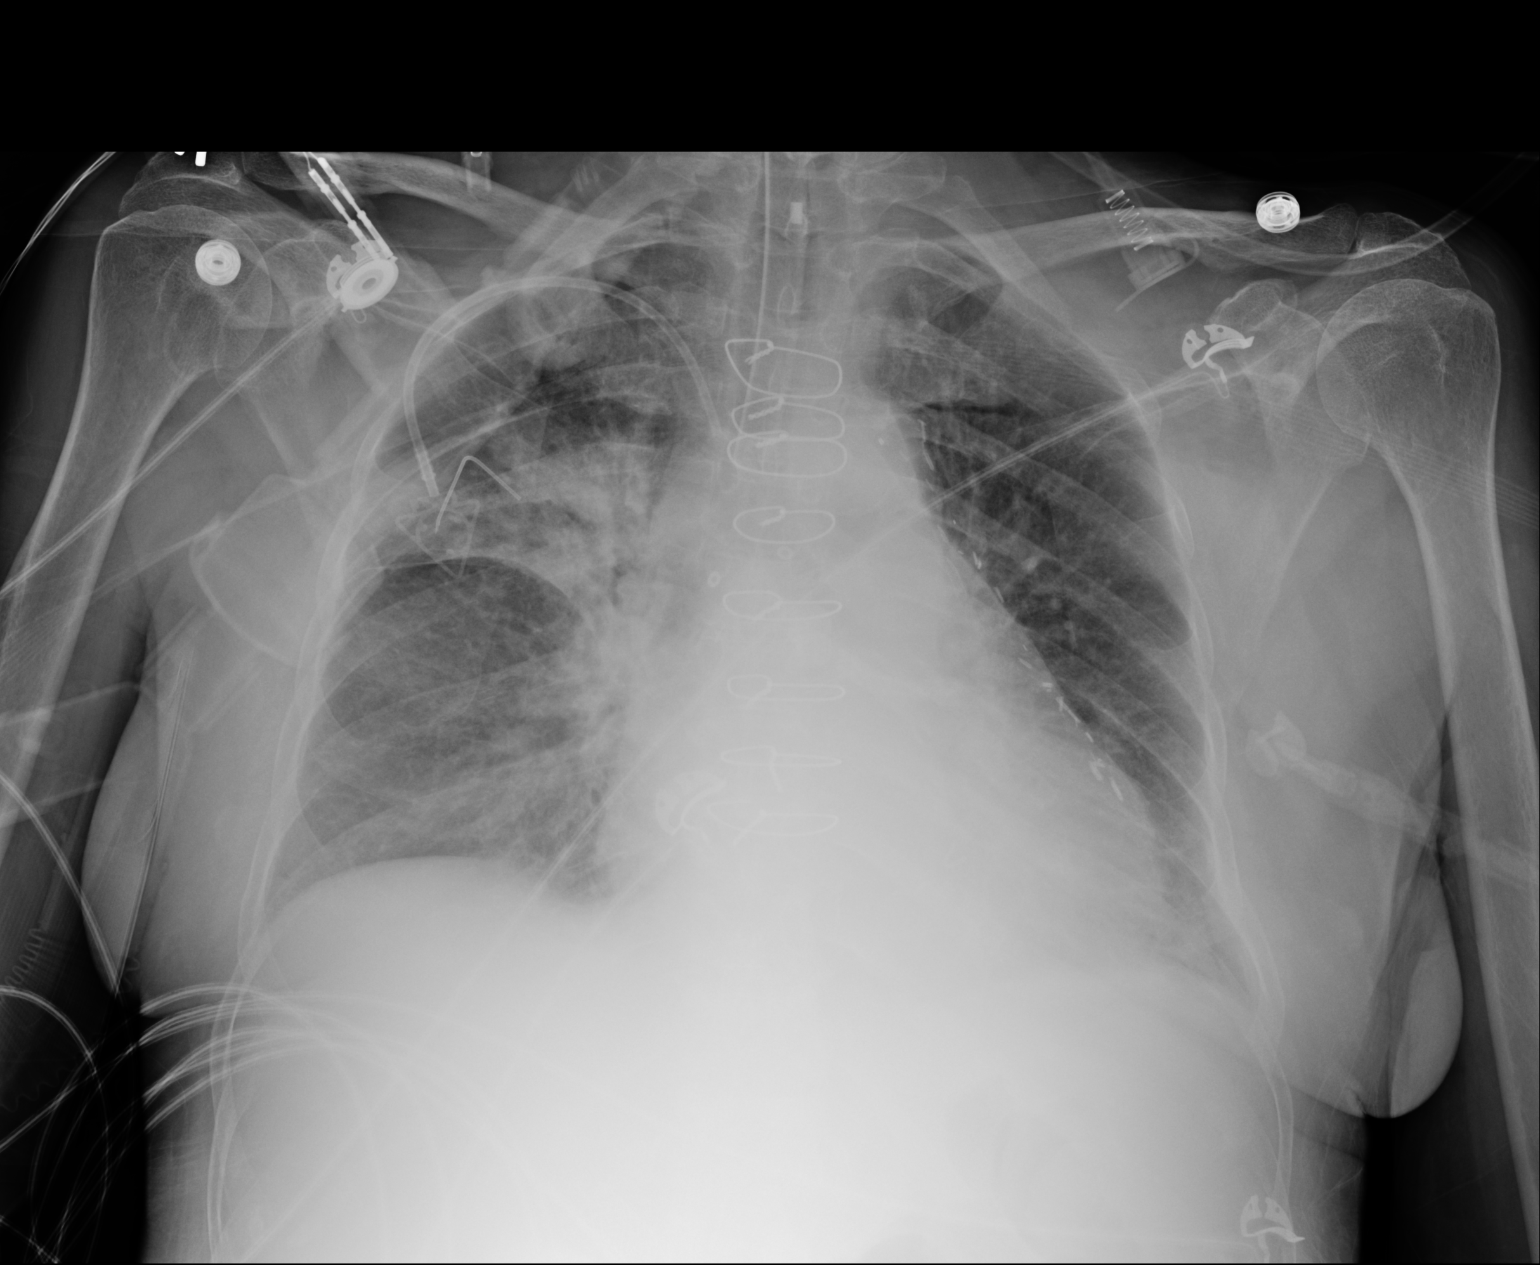

[1 of 1 positions shown; findings below may reference images not displayed]

FINDINGS: Endotracheal tube terminates 3 cm above the carina.

Right upper lobe opacity, suspicious for pneumonia. No pleural
effusion or pneumothorax.

Cardiomegaly.  Postsurgical changes related to prior CABG.

Prominent right perihilar opacity, underlying adenopathy not
excluded.

Right chest power port terminating in the mid SVC.
IMPRESSION: Endotracheal tube terminates 3 cm above the carina.

Right upper lobe opacity, suspicious for pneumonia.

## 2015-12-31 IMAGING — RF DG ERCP WO/W SPHINCTEROTOMY
1 series · 15 of 24 positions shown · non-contrast
Comparison: MRI 01/13/2014

CLINICAL DATA: History of upper abdominal pain and nausea. History
of peritoneal carcinomatosis.

EXAM:
ERCP
TECHNIQUE: Multiple spot images obtained with the fluoroscopic device and
submitted for interpretation post-procedure.

[Series 1: run · 12 acquisitions, 15 frames shown]
[im 1/12]
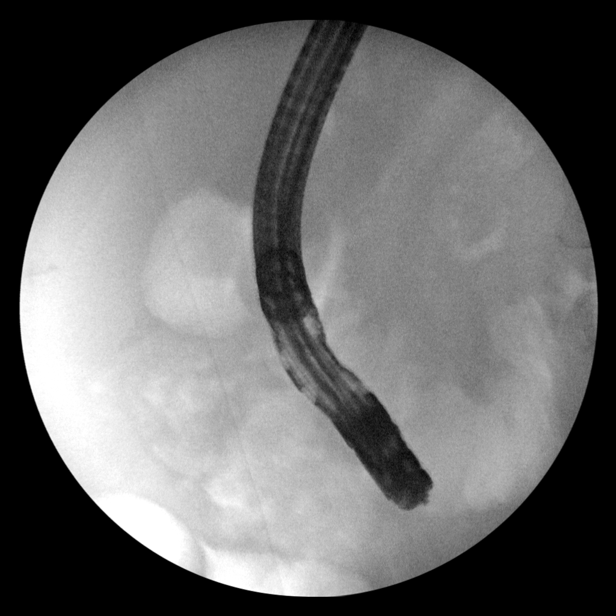
[im 2/12]
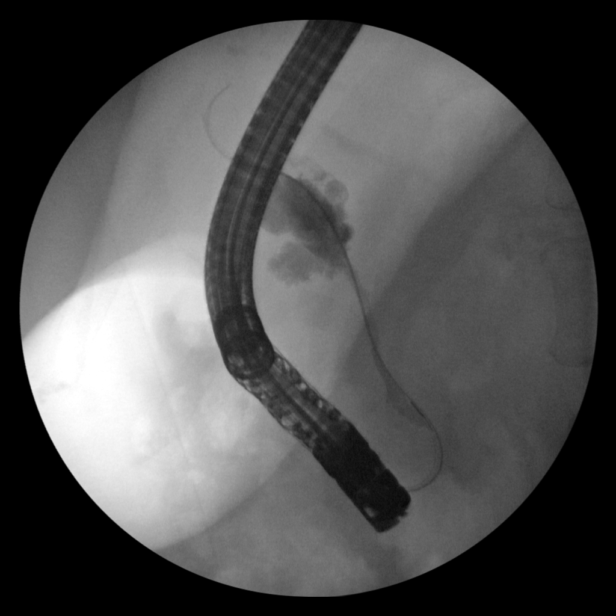
[im 3/12]
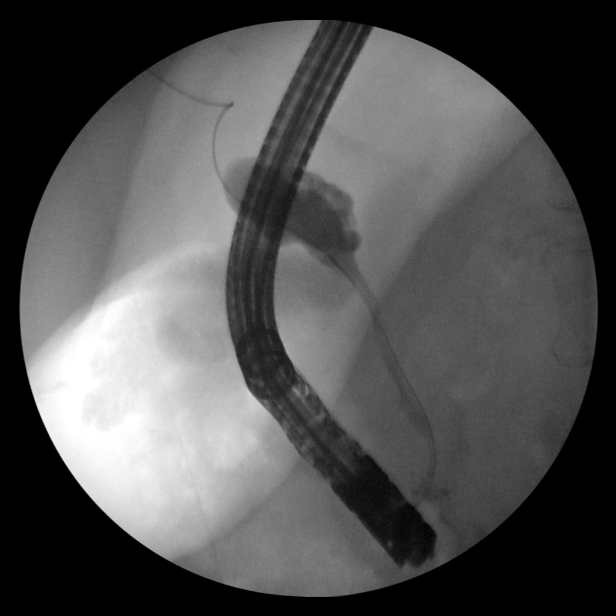
[im 3/12]
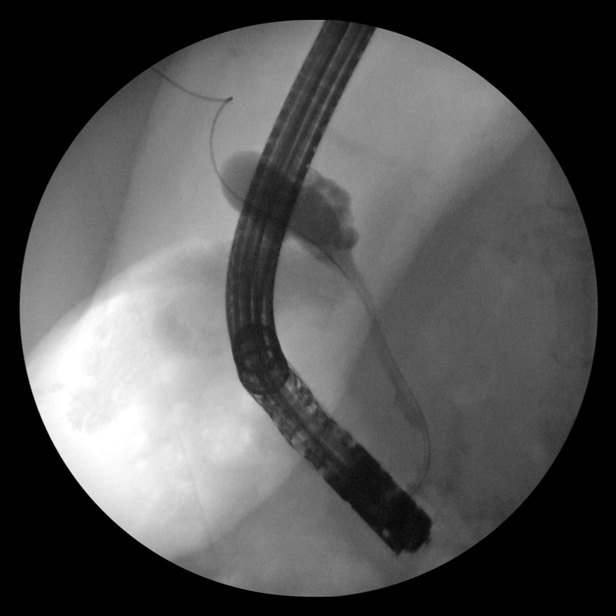
[im 3/12]
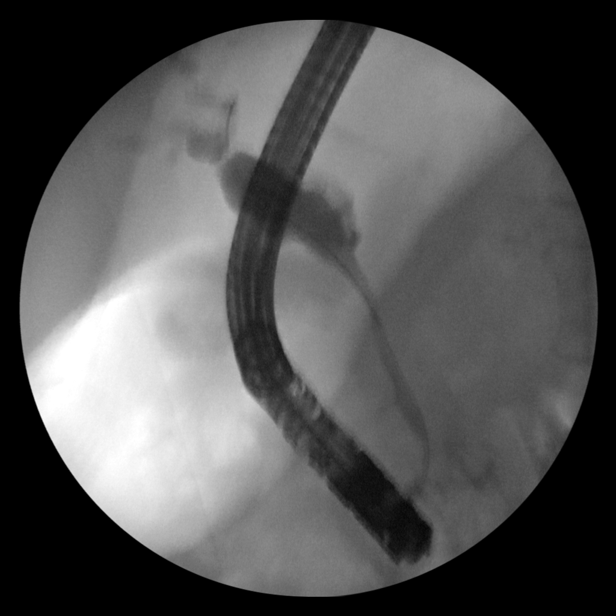
[im 4/12]
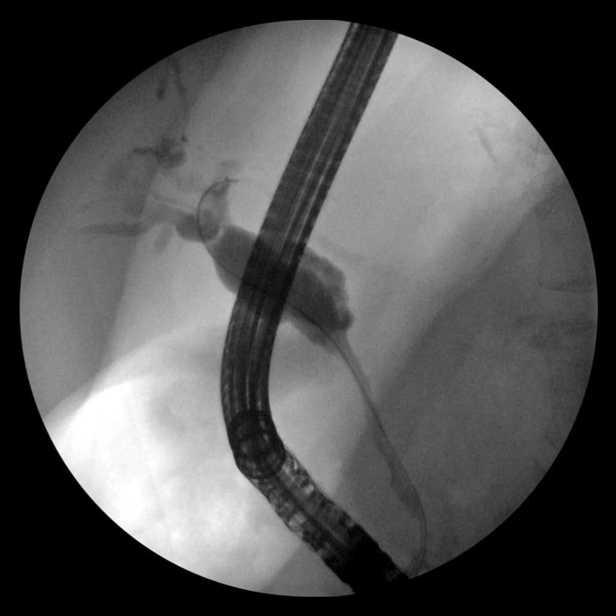
[im 4/12]
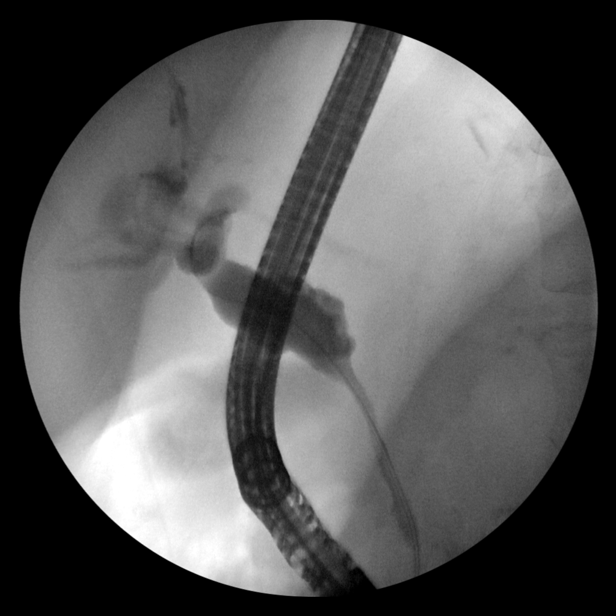
[im 6/12]
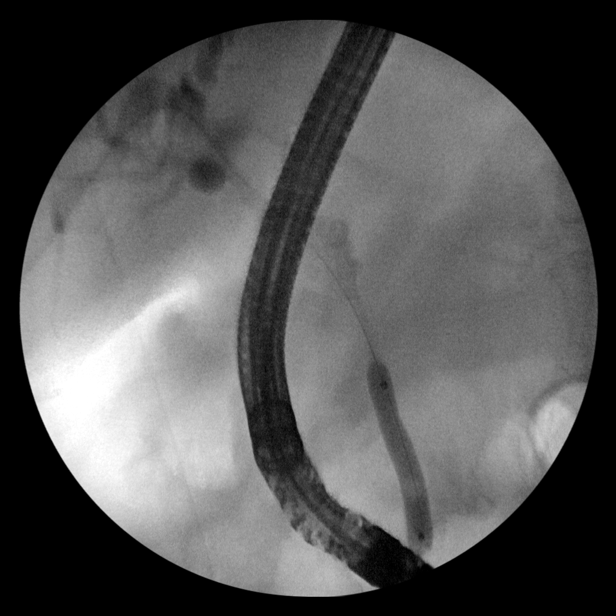
[im 7/12]
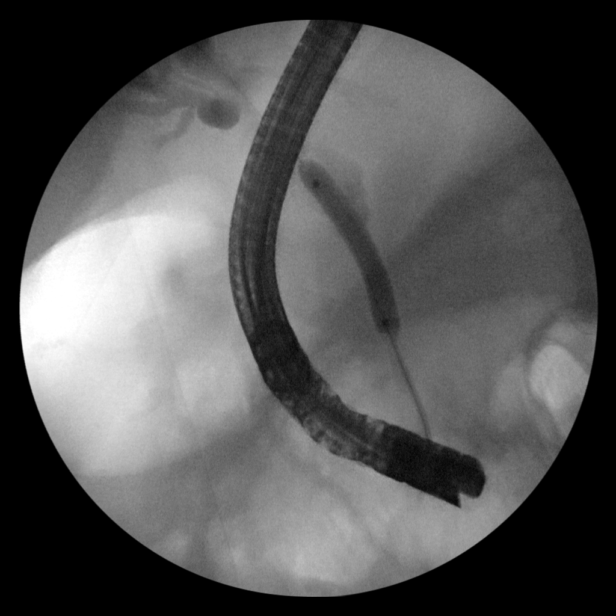
[im 8/12]
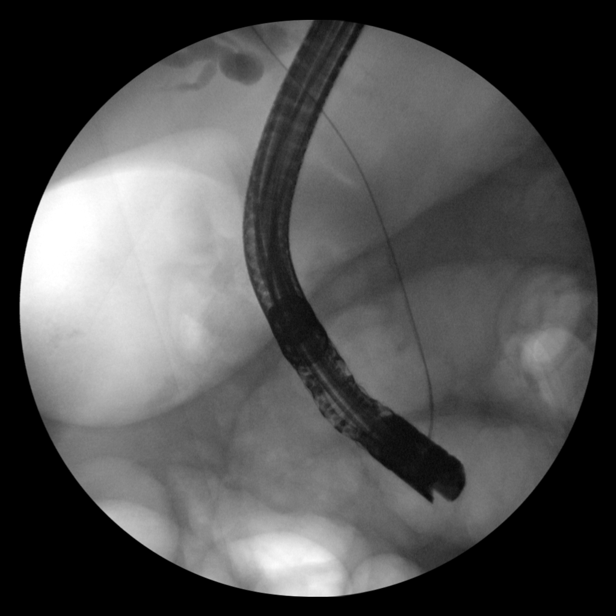
[im 8/12]
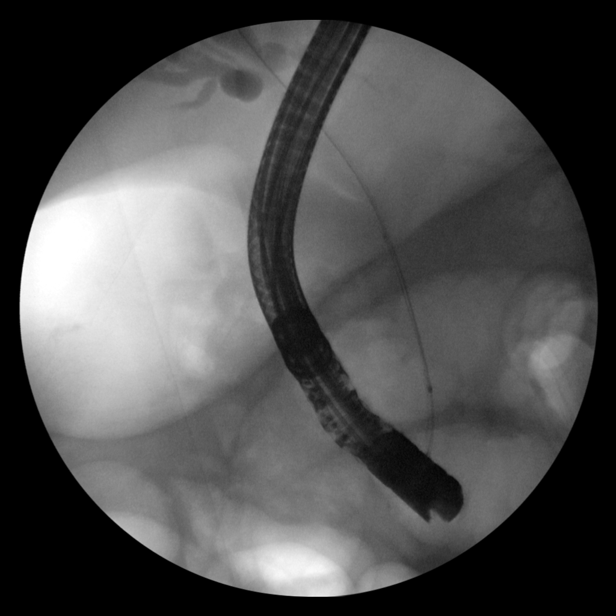
[im 9/12]
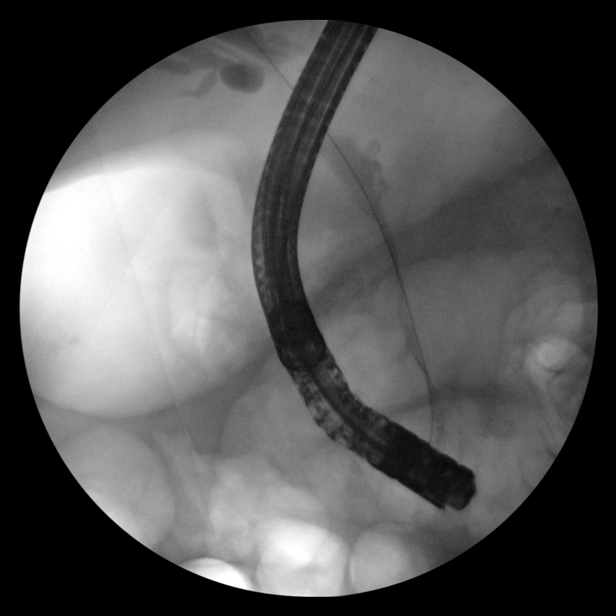
[im 9/12]
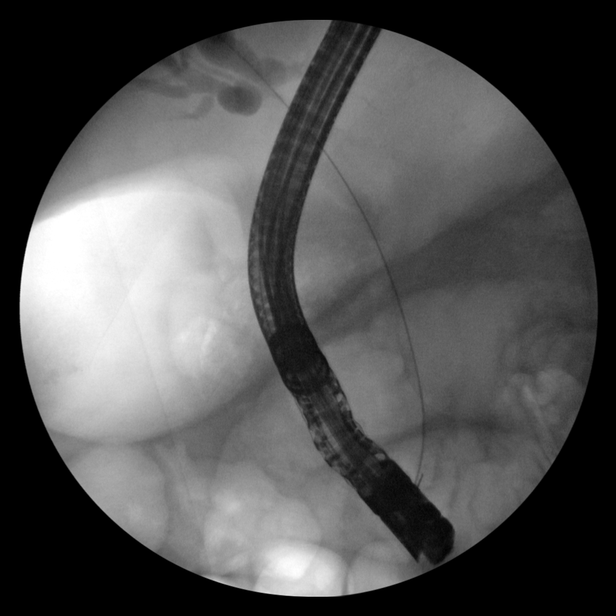
[im 10/12]
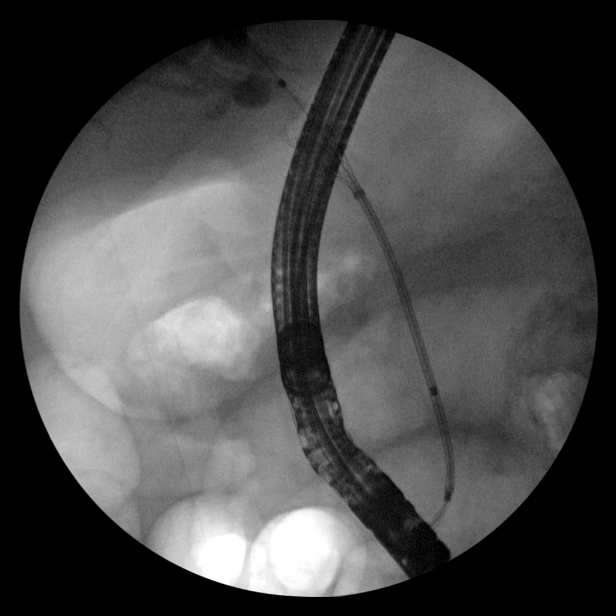
[im 12/12]
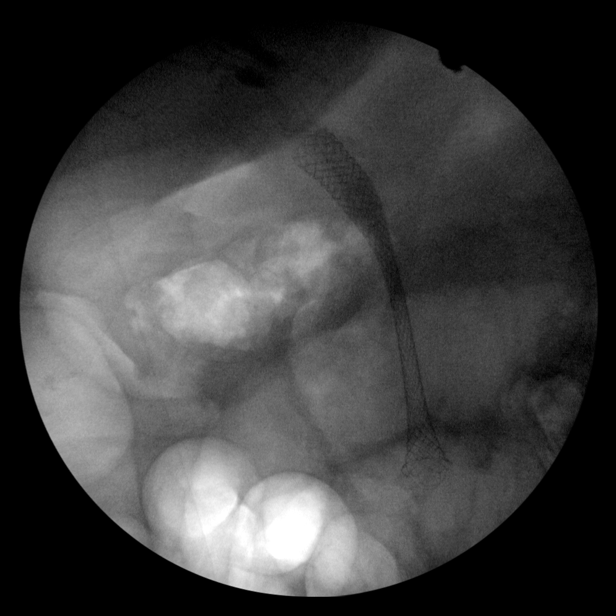

[15 of 24 positions shown; findings below may reference images not displayed]

FINDINGS: Contrast injections demonstrate dilatation of the intrahepatic
biliary system and common hepatic duct. Contrast drains into the
gallbladder. There is a long segment narrowing of the common bile
duct. Wire was advanced into the intrahepatic biliary system. The
common bile duct was dilated with a balloon. Evidence for brush
biopsy. A metallic stent was placed in the common hepatic duct and
common bile duct.
IMPRESSION: Narrowing of the common bile duct with dilatation of the common
hepatic duct and intrahepatic ducts.

Placement of metallic biliary stent.

These images were submitted for radiologic interpretation only.
Please see the procedural report for the amount of contrast and the
fluoroscopy time utilized.

## 2015-12-31 IMAGING — CR DG CHEST 1V PORT
1 series · 1 of 1 positions shown · non-contrast
Comparison: Chest x-rays dated 01/15/2014 at [DATE] p.m. and
01/11/2014

CLINICAL DATA: Endotracheal tube placement.  Pulmonary edema.

EXAM:
PORTABLE CHEST - 1 VIEW [DATE] p.m.

[portable]
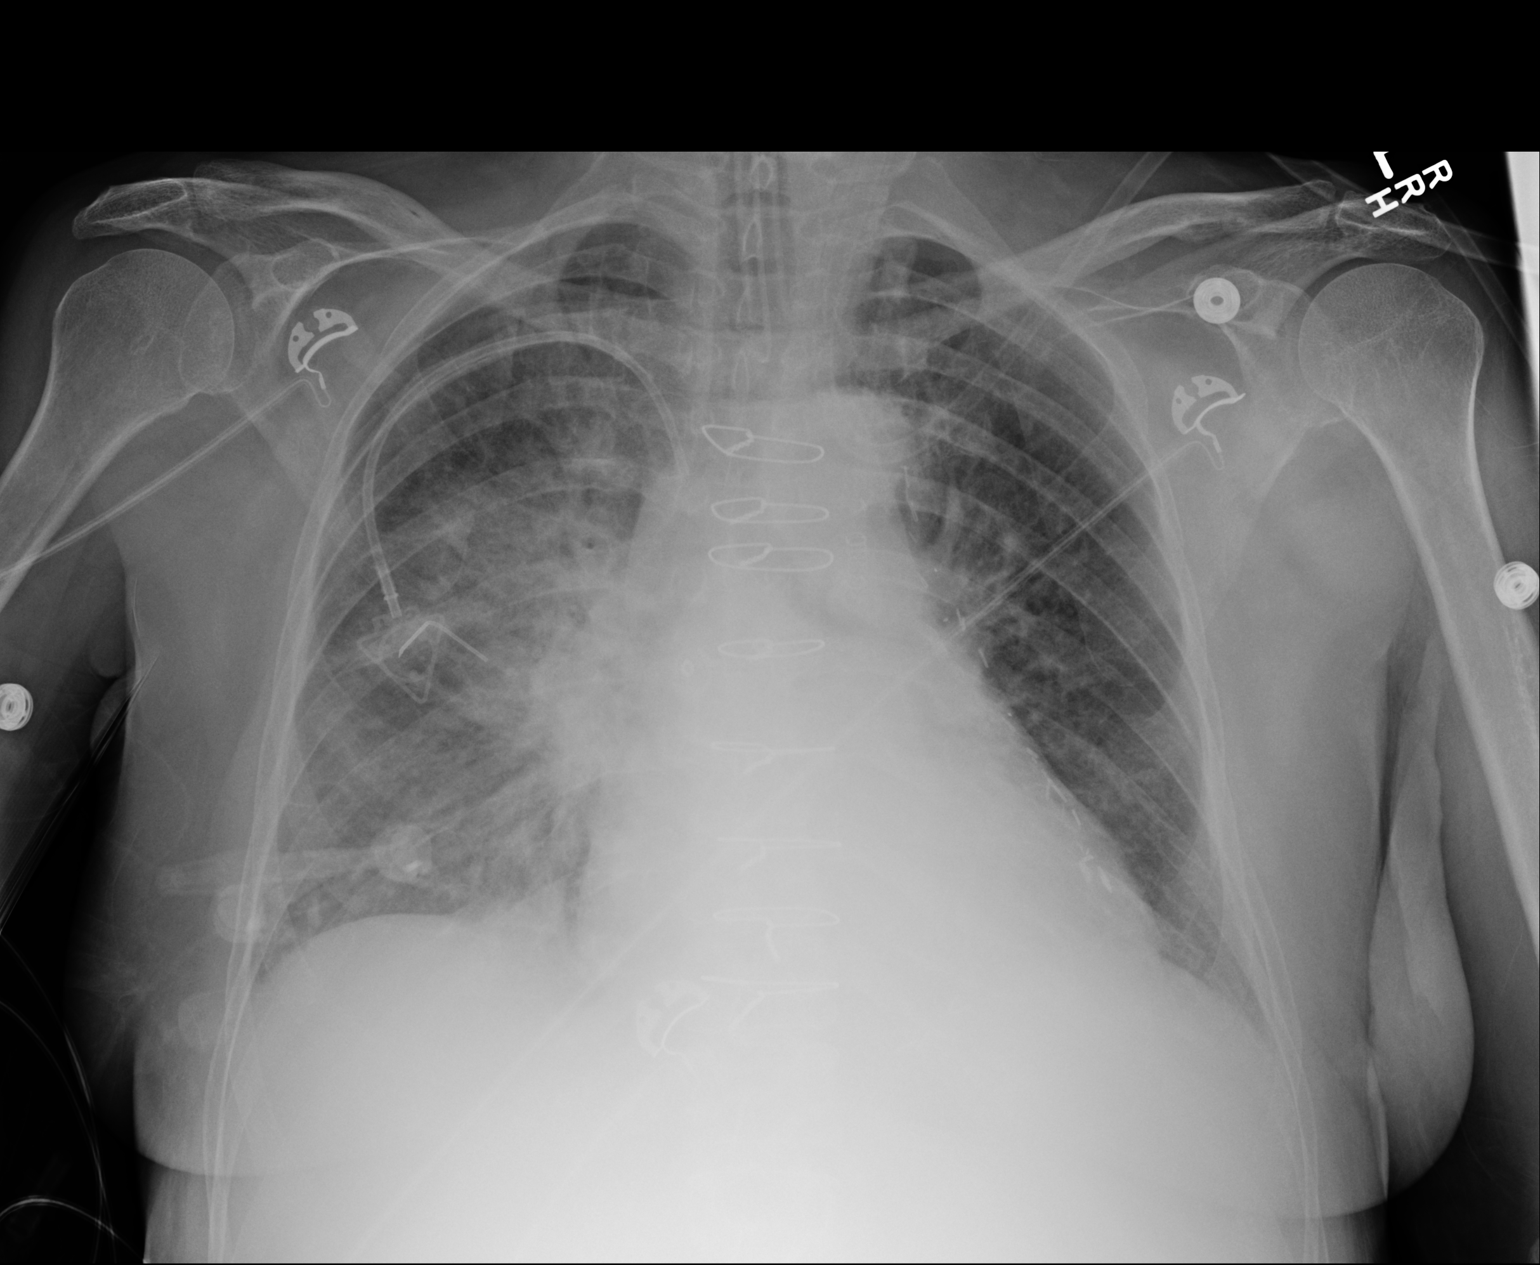

[1 of 1 positions shown; findings below may reference images not displayed]

FINDINGS: Endotracheal tube has been inserted and is in good position. Power
port in place. Bilateral pulmonary edema has improved. There is
persistent increased density at the left lung base which could
represent atelectasis or infiltrate.

There is a persistent right perihilar infiltrate.
IMPRESSION: ET tube in good position. Improved pulmonary edema. Persistent right
perihilar infiltrate and left base infiltrate or atelectasis.

## 2016-01-02 IMAGING — US US RENAL
1 series · 14 of 25 positions shown · non-contrast
Comparison: MRI abdomen 01/13/2014.

CLINICAL DATA: Hydronephrosis.

EXAM:
RENAL/URINARY TRACT ULTRASOUND COMPLETE

[Series 1: us renal · 0.26mm/px · 14 of 65 slices shown]
[im 1/65]
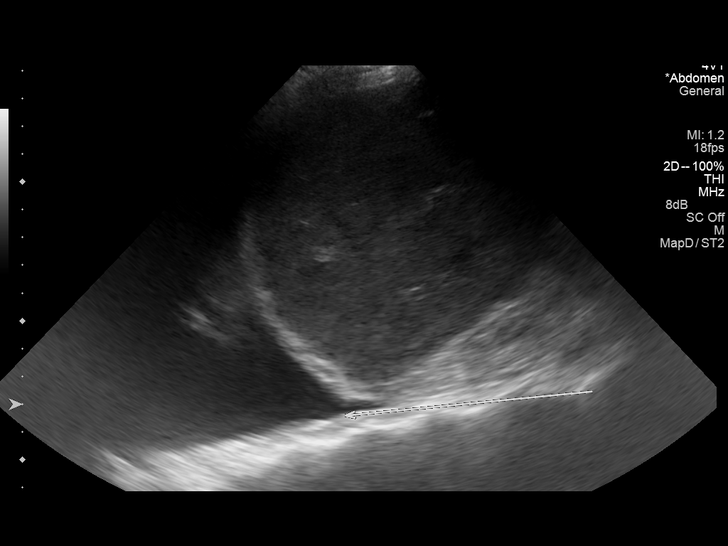
[im 6/65]
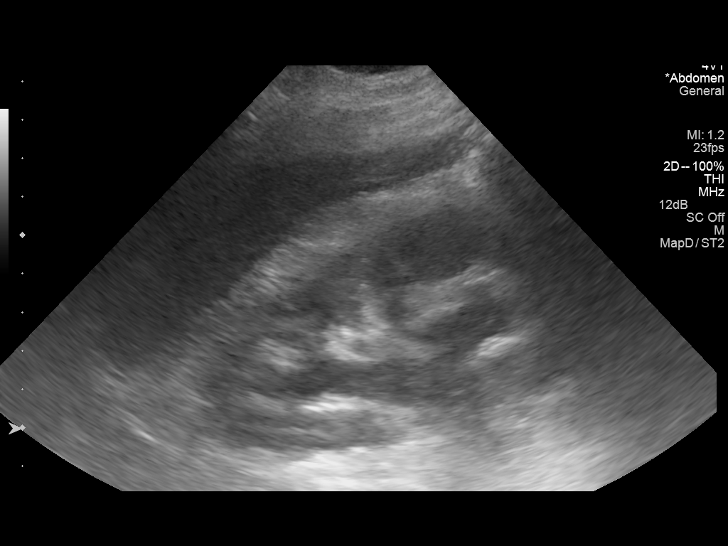
[im 11/65]
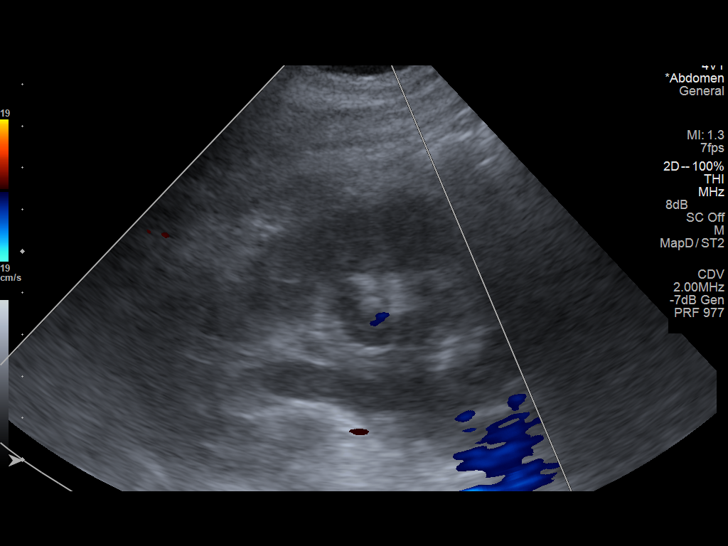
[im 17/65]
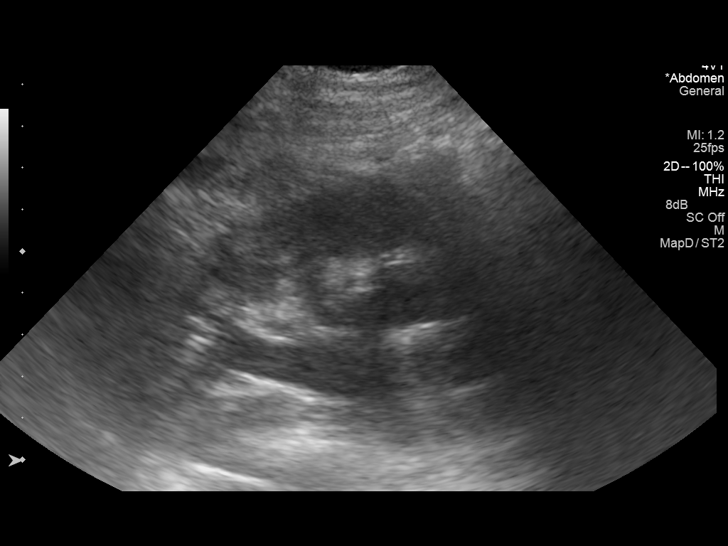
[im 22/65]
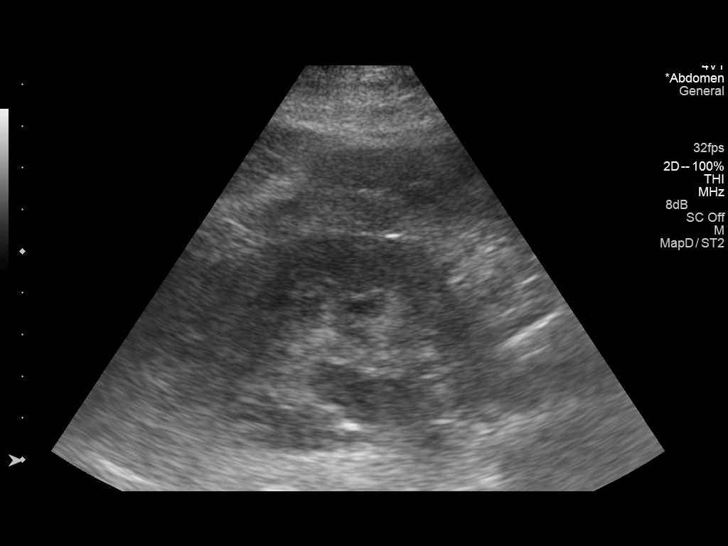
[im 25/65]
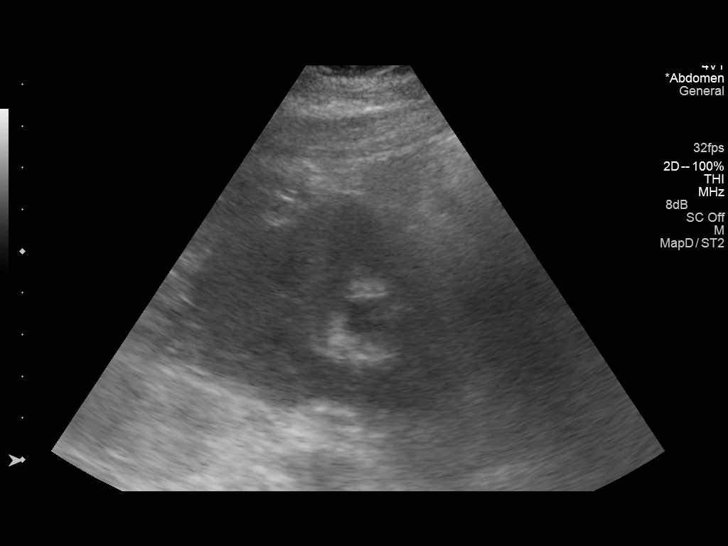
[im 30/65]
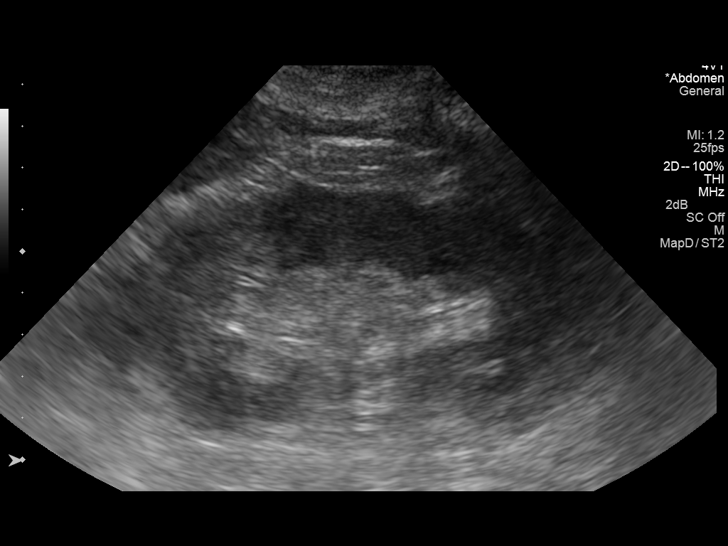
[im 35/65]
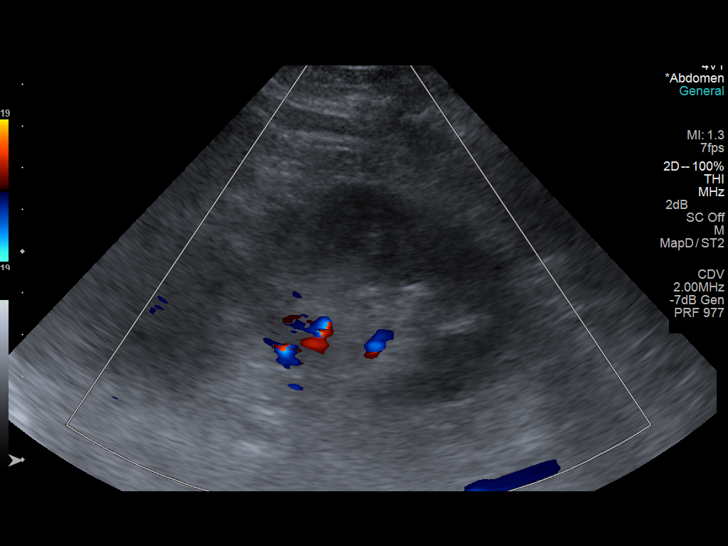
[im 41/65]
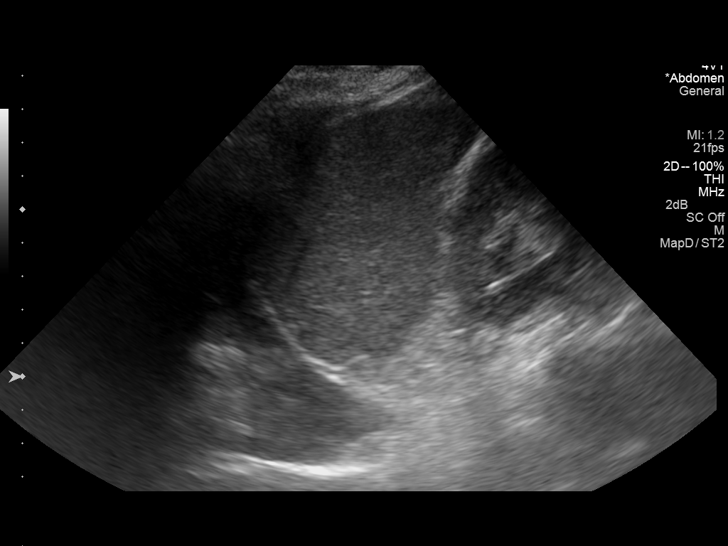
[im 43/65]
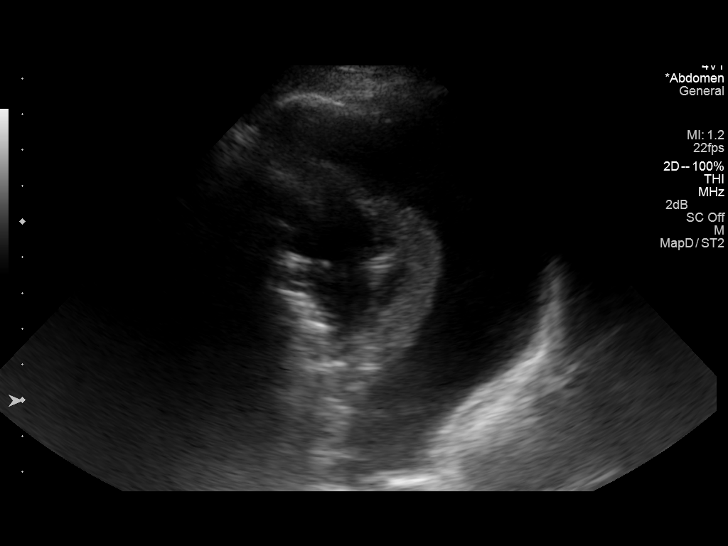
[im 49/65]
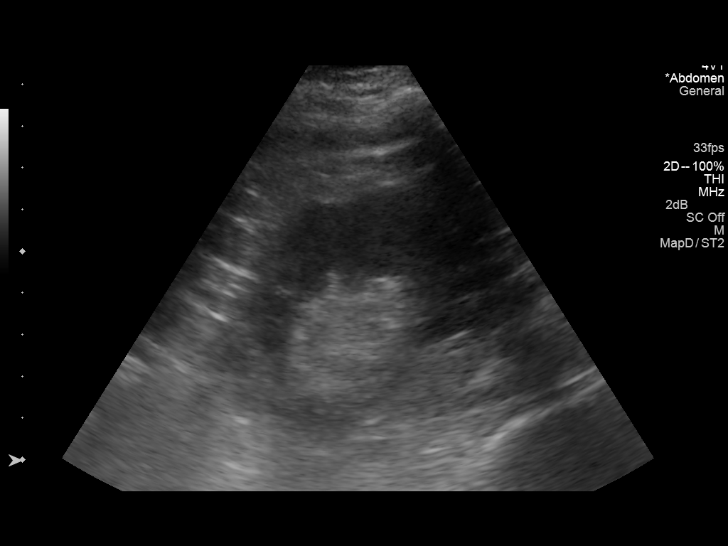
[im 54/65]
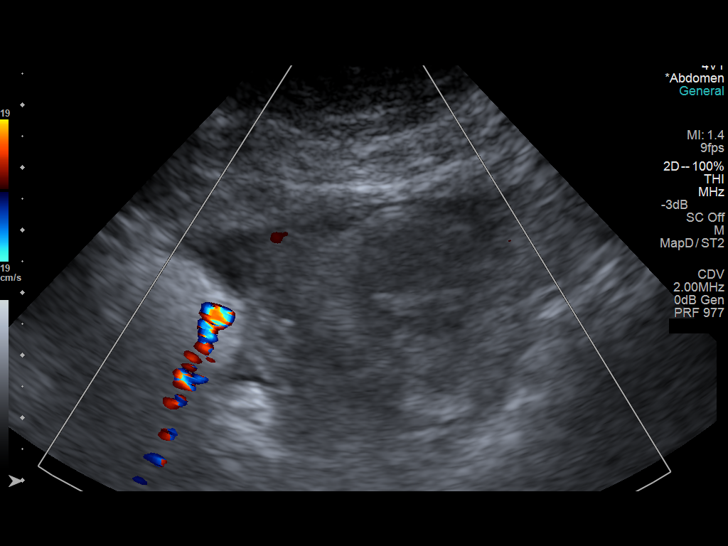
[im 59/65]
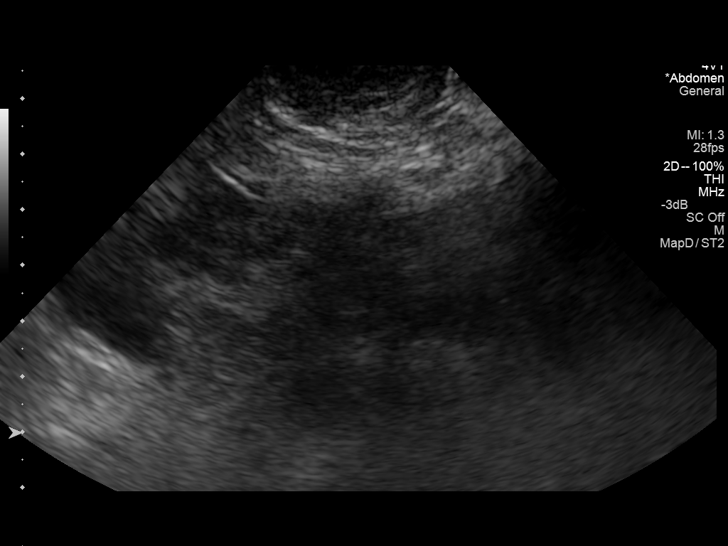
[im 65/65]
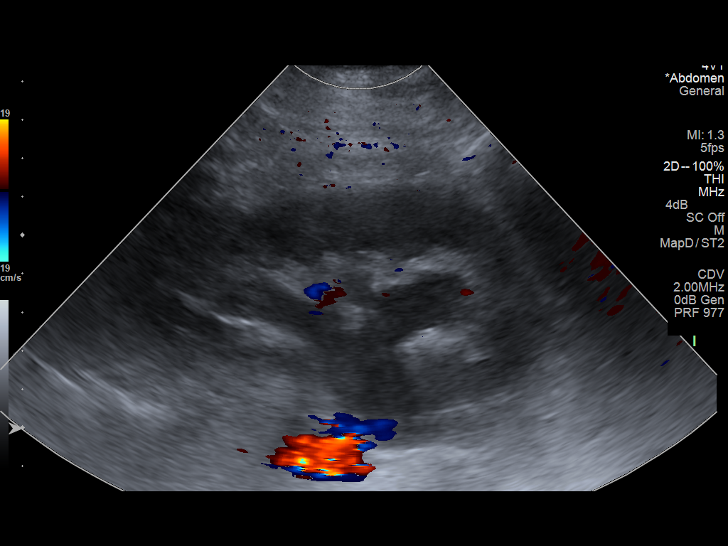

[14 of 25 positions shown; findings below may reference images not displayed]

FINDINGS: Right Kidney:

Length: 10.7 cm. Echogenicity within normal limits. No mass. Mild
hydronephrosis. Under presence appears to improve from prior MRCP of
01/13/2014 .

Left Kidney:

Length: 11.5 cm. Echogenicity within normal limits. No mass or
hydronephrosis visualized.

Bladder:

Appears normal for degree of bladder distention.

Right pleural effusion.  Mild ascites cannot be excluded.
IMPRESSION: 1. Mild right hydronephrosis. Interim improvement from prior MRCP of
01/13/2014.
2. Right pleural effusion.  Mild ascites.

## 2016-03-27 ENCOUNTER — Other Ambulatory Visit: Payer: Self-pay | Admitting: Nurse Practitioner
